# Patient Record
Sex: Male | Born: 1970 | Race: White | Hispanic: No | Marital: Single | Smoking: Former smoker
Health system: Southern US, Community
[De-identification: ages and names within clinical notes are randomized; demographics above are authoritative.]

## PROBLEM LIST (undated history)

## (undated) DIAGNOSIS — Z8659 Personal history of other mental and behavioral disorders: Secondary | ICD-10-CM

## (undated) DIAGNOSIS — G35 Multiple sclerosis: Secondary | ICD-10-CM

## (undated) DIAGNOSIS — Z9889 Other specified postprocedural states: Secondary | ICD-10-CM

## (undated) DIAGNOSIS — R0609 Other forms of dyspnea: Secondary | ICD-10-CM

## (undated) DIAGNOSIS — F32A Depression, unspecified: Secondary | ICD-10-CM

## (undated) DIAGNOSIS — G35D Multiple sclerosis, unspecified: Secondary | ICD-10-CM

## (undated) DIAGNOSIS — H35069 Retinal vasculitis, unspecified eye: Secondary | ICD-10-CM

## (undated) DIAGNOSIS — F419 Anxiety disorder, unspecified: Secondary | ICD-10-CM

## (undated) DIAGNOSIS — I10 Essential (primary) hypertension: Secondary | ICD-10-CM

## (undated) DIAGNOSIS — Z8619 Personal history of other infectious and parasitic diseases: Secondary | ICD-10-CM

## (undated) DIAGNOSIS — Z87442 Personal history of urinary calculi: Secondary | ICD-10-CM

## (undated) DIAGNOSIS — M5030 Other cervical disc degeneration, unspecified cervical region: Secondary | ICD-10-CM

## (undated) DIAGNOSIS — M797 Fibromyalgia: Secondary | ICD-10-CM

## (undated) DIAGNOSIS — R0600 Dyspnea, unspecified: Secondary | ICD-10-CM

## (undated) DIAGNOSIS — N400 Enlarged prostate without lower urinary tract symptoms: Secondary | ICD-10-CM

## (undated) DIAGNOSIS — A630 Anogenital (venereal) warts: Secondary | ICD-10-CM

## (undated) DIAGNOSIS — R51 Headache: Secondary | ICD-10-CM

## (undated) DIAGNOSIS — H209 Unspecified iridocyclitis: Secondary | ICD-10-CM

## (undated) DIAGNOSIS — F1090 Alcohol use, unspecified, uncomplicated: Secondary | ICD-10-CM

## (undated) DIAGNOSIS — M1990 Unspecified osteoarthritis, unspecified site: Secondary | ICD-10-CM

## (undated) DIAGNOSIS — G8929 Other chronic pain: Secondary | ICD-10-CM

## (undated) DIAGNOSIS — H35373 Puckering of macula, bilateral: Secondary | ICD-10-CM

## (undated) DIAGNOSIS — J439 Emphysema, unspecified: Secondary | ICD-10-CM

## (undated) DIAGNOSIS — K219 Gastro-esophageal reflux disease without esophagitis: Secondary | ICD-10-CM

## (undated) DIAGNOSIS — R7303 Prediabetes: Secondary | ICD-10-CM

## (undated) DIAGNOSIS — Z7289 Other problems related to lifestyle: Secondary | ICD-10-CM

## (undated) DIAGNOSIS — Z8719 Personal history of other diseases of the digestive system: Secondary | ICD-10-CM

## (undated) DIAGNOSIS — N200 Calculus of kidney: Secondary | ICD-10-CM

## (undated) DIAGNOSIS — M549 Dorsalgia, unspecified: Secondary | ICD-10-CM

## (undated) DIAGNOSIS — F329 Major depressive disorder, single episode, unspecified: Secondary | ICD-10-CM

## (undated) DIAGNOSIS — Z789 Other specified health status: Secondary | ICD-10-CM

## (undated) DIAGNOSIS — N201 Calculus of ureter: Secondary | ICD-10-CM

## (undated) HISTORY — PX: WISDOM TOOTH EXTRACTION: SHX21

## (undated) HISTORY — DX: Dorsalgia, unspecified: M54.9

## (undated) HISTORY — DX: Anogenital (venereal) warts: A63.0

## (undated) HISTORY — PX: OTHER SURGICAL HISTORY: SHX169

## (undated) HISTORY — DX: Anxiety disorder, unspecified: F41.9

## (undated) HISTORY — PX: CATARACT EXTRACTION W/ INTRAOCULAR LENS  IMPLANT, BILATERAL: SHX1307

## (undated) HISTORY — DX: Multiple sclerosis: G35

## (undated) HISTORY — DX: Other chronic pain: G89.29

## (undated) HISTORY — DX: Multiple sclerosis, unspecified: G35.D

## (undated) HISTORY — DX: Gastro-esophageal reflux disease without esophagitis: K21.9

## (undated) SURGERY — BRONCHOSCOPY, FLEXIBLE
Anesthesia: Moderate Sedation | Laterality: Bilateral

---

## 1998-03-25 ENCOUNTER — Inpatient Hospital Stay (HOSPITAL_COMMUNITY): Admission: EM | Admit: 1998-03-25 | Discharge: 1998-03-25 | Payer: Self-pay | Admitting: Emergency Medicine

## 1998-04-30 ENCOUNTER — Inpatient Hospital Stay (HOSPITAL_COMMUNITY): Admission: EM | Admit: 1998-04-30 | Discharge: 1998-05-08 | Payer: Self-pay | Admitting: Emergency Medicine

## 2000-10-05 ENCOUNTER — Emergency Department (HOSPITAL_COMMUNITY): Admission: EM | Admit: 2000-10-05 | Discharge: 2000-10-06 | Payer: Self-pay | Admitting: Emergency Medicine

## 2000-10-05 ENCOUNTER — Encounter: Payer: Self-pay | Admitting: *Deleted

## 2000-10-05 ENCOUNTER — Encounter: Payer: Self-pay | Admitting: Emergency Medicine

## 2002-01-20 ENCOUNTER — Emergency Department (HOSPITAL_COMMUNITY): Admission: EM | Admit: 2002-01-20 | Discharge: 2002-01-20 | Payer: Self-pay | Admitting: Emergency Medicine

## 2002-04-29 ENCOUNTER — Emergency Department (HOSPITAL_COMMUNITY): Admission: EM | Admit: 2002-04-29 | Discharge: 2002-04-29 | Payer: Medicare Other | Admitting: *Deleted

## 2002-05-16 ENCOUNTER — Emergency Department (HOSPITAL_COMMUNITY): Admission: EM | Admit: 2002-05-16 | Discharge: 2002-05-16 | Payer: Self-pay | Admitting: Emergency Medicine

## 2003-05-17 ENCOUNTER — Emergency Department (HOSPITAL_COMMUNITY): Admission: EM | Admit: 2003-05-17 | Discharge: 2003-05-17 | Payer: Self-pay | Admitting: Emergency Medicine

## 2003-10-10 ENCOUNTER — Emergency Department (HOSPITAL_COMMUNITY): Admission: EM | Admit: 2003-10-10 | Discharge: 2003-10-10 | Payer: Self-pay | Admitting: Emergency Medicine

## 2003-12-27 ENCOUNTER — Emergency Department (HOSPITAL_COMMUNITY): Admission: EM | Admit: 2003-12-27 | Discharge: 2003-12-28 | Payer: Self-pay | Admitting: Emergency Medicine

## 2004-01-18 ENCOUNTER — Ambulatory Visit (HOSPITAL_COMMUNITY): Admission: RE | Admit: 2004-01-18 | Discharge: 2004-01-18 | Payer: Self-pay | Admitting: Family Medicine

## 2004-01-19 ENCOUNTER — Ambulatory Visit (HOSPITAL_COMMUNITY): Admission: RE | Admit: 2004-01-19 | Discharge: 2004-01-19 | Payer: Self-pay | Admitting: Family Medicine

## 2004-01-20 ENCOUNTER — Ambulatory Visit: Payer: Self-pay | Admitting: Internal Medicine

## 2004-01-20 ENCOUNTER — Ambulatory Visit (HOSPITAL_COMMUNITY): Admission: RE | Admit: 2004-01-20 | Discharge: 2004-01-20 | Payer: Self-pay | Admitting: Internal Medicine

## 2004-01-30 ENCOUNTER — Ambulatory Visit (HOSPITAL_COMMUNITY): Admission: RE | Admit: 2004-01-30 | Discharge: 2004-01-30 | Payer: Self-pay | Admitting: Family Medicine

## 2004-02-24 ENCOUNTER — Emergency Department (HOSPITAL_COMMUNITY): Admission: EM | Admit: 2004-02-24 | Discharge: 2004-02-24 | Payer: Self-pay | Admitting: Emergency Medicine

## 2004-07-03 ENCOUNTER — Ambulatory Visit: Payer: Self-pay | Admitting: Internal Medicine

## 2004-07-27 ENCOUNTER — Ambulatory Visit (HOSPITAL_COMMUNITY): Admission: RE | Admit: 2004-07-27 | Discharge: 2004-07-27 | Payer: Self-pay | Admitting: Internal Medicine

## 2004-07-27 ENCOUNTER — Ambulatory Visit: Payer: Self-pay | Admitting: Internal Medicine

## 2004-10-15 ENCOUNTER — Ambulatory Visit: Payer: Self-pay | Admitting: Psychiatry

## 2004-10-15 ENCOUNTER — Inpatient Hospital Stay (HOSPITAL_COMMUNITY): Admission: RE | Admit: 2004-10-15 | Discharge: 2004-10-19 | Payer: Self-pay | Admitting: Psychiatry

## 2004-10-29 ENCOUNTER — Emergency Department (HOSPITAL_COMMUNITY): Admission: EM | Admit: 2004-10-29 | Discharge: 2004-10-29 | Payer: Self-pay | Admitting: Emergency Medicine

## 2005-01-23 ENCOUNTER — Inpatient Hospital Stay (HOSPITAL_COMMUNITY): Admission: EM | Admit: 2005-01-23 | Discharge: 2005-01-25 | Payer: Self-pay | Admitting: Emergency Medicine

## 2006-07-08 ENCOUNTER — Inpatient Hospital Stay (HOSPITAL_COMMUNITY): Admission: EM | Admit: 2006-07-08 | Discharge: 2006-07-09 | Payer: Self-pay | Admitting: Emergency Medicine

## 2006-08-14 ENCOUNTER — Ambulatory Visit (HOSPITAL_COMMUNITY): Admission: RE | Admit: 2006-08-14 | Discharge: 2006-08-14 | Payer: Self-pay | Admitting: Family Medicine

## 2006-11-21 ENCOUNTER — Ambulatory Visit (HOSPITAL_COMMUNITY): Admission: RE | Admit: 2006-11-21 | Discharge: 2006-11-21 | Payer: Self-pay | Admitting: Neurosurgery

## 2007-04-22 ENCOUNTER — Ambulatory Visit (HOSPITAL_COMMUNITY): Admission: RE | Admit: 2007-04-22 | Discharge: 2007-04-22 | Payer: Self-pay

## 2007-10-06 ENCOUNTER — Encounter: Admission: RE | Admit: 2007-10-06 | Discharge: 2007-10-06 | Payer: Self-pay | Admitting: Family Medicine

## 2007-10-20 ENCOUNTER — Encounter: Admission: RE | Admit: 2007-10-20 | Discharge: 2007-10-20 | Payer: Self-pay | Admitting: Family Medicine

## 2007-11-05 ENCOUNTER — Encounter: Admission: RE | Admit: 2007-11-05 | Discharge: 2007-11-05 | Payer: Self-pay | Admitting: Family Medicine

## 2008-01-15 ENCOUNTER — Encounter (INDEPENDENT_AMBULATORY_CARE_PROVIDER_SITE_OTHER): Payer: Self-pay | Admitting: General Surgery

## 2008-01-15 ENCOUNTER — Ambulatory Visit (HOSPITAL_COMMUNITY): Admission: RE | Admit: 2008-01-15 | Discharge: 2008-01-15 | Payer: Self-pay | Admitting: General Surgery

## 2008-05-06 ENCOUNTER — Ambulatory Visit (HOSPITAL_COMMUNITY): Admission: RE | Admit: 2008-05-06 | Discharge: 2008-05-06 | Payer: Self-pay | Admitting: Family Medicine

## 2009-12-29 ENCOUNTER — Ambulatory Visit (HOSPITAL_COMMUNITY)
Admission: RE | Admit: 2009-12-29 | Discharge: 2009-12-29 | Payer: Self-pay | Source: Home / Self Care | Admitting: General Surgery

## 2009-12-30 ENCOUNTER — Emergency Department (HOSPITAL_COMMUNITY): Admission: EM | Admit: 2009-12-30 | Discharge: 2009-12-31 | Payer: Self-pay | Admitting: Emergency Medicine

## 2010-03-03 ENCOUNTER — Encounter: Payer: Self-pay | Admitting: Family Medicine

## 2010-03-04 ENCOUNTER — Encounter: Payer: Self-pay | Admitting: Family Medicine

## 2010-03-29 ENCOUNTER — Encounter (INDEPENDENT_AMBULATORY_CARE_PROVIDER_SITE_OTHER): Payer: Self-pay | Admitting: *Deleted

## 2010-04-04 NOTE — Letter (Signed)
Summary: Unable to Reach, Consult Scheduled  [facility]  [address]   [place], [place] [address]   Phone: [phone]  Fax: [phone]    03/29/2010  Brent [name] [address] [place], [place]  [address] January 12, 1971   Dear Mr. Brent [name],   We have been unable to reach you by phone.     At the recommendation of DR KNOWLTON  we have been asked to schedule you a consult with DR ROURK/DR FIELDS for CHRONIC PANCREATITIS.   Please call our office at [phone].     Thank you,    Diana Eves  [facility] R. Roetta Sessions, M.D.    Jonette Eva, M.D. Lorenza Burton, FNP-BC    Tana Coast, PA-C Phone: [phone]    Fax: [phone]

## 2010-04-23 ENCOUNTER — Ambulatory Visit: Payer: Self-pay | Admitting: Gastroenterology

## 2010-04-24 LAB — CBC
HCT: 42.1 % (ref 39.0–52.0)
Hemoglobin: 14.5 g/dL (ref 13.0–17.0)
MCH: 33.3 pg (ref 26.0–34.0)
MCHC: 34.4 g/dL (ref 30.0–36.0)
MCV: 96.9 fL (ref 78.0–100.0)
MCV: 97.8 fL (ref 78.0–100.0)
Platelets: 307 10*3/uL (ref 150–400)
Platelets: 312 10*3/uL (ref 150–400)
RBC: 4.35 MIL/uL (ref 4.22–5.81)
RDW: 12.7 % (ref 11.5–15.5)
RDW: 13.1 % (ref 11.5–15.5)
WBC: 10.2 10*3/uL (ref 4.0–10.5)
WBC: 9 10*3/uL (ref 4.0–10.5)

## 2010-04-24 LAB — DIFFERENTIAL
Basophils Absolute: 0.1 10*3/uL (ref 0.0–0.1)
Basophils Relative: 1 % (ref 0–1)
Eosinophils Relative: 2 % (ref 0–5)
Lymphocytes Relative: 22 % (ref 12–46)
Neutro Abs: 7.1 10*3/uL (ref 1.7–7.7)

## 2010-04-24 LAB — BASIC METABOLIC PANEL
BUN: 5 mg/dL — ABNORMAL LOW (ref 6–23)
BUN: 5 mg/dL — ABNORMAL LOW (ref 6–23)
CO2: 29 mEq/L (ref 19–32)
Calcium: 9 mg/dL (ref 8.4–10.5)
Chloride: 104 mEq/L (ref 96–112)
Creatinine, Ser: 0.7 mg/dL (ref 0.4–1.5)
Creatinine, Ser: 0.83 mg/dL (ref 0.4–1.5)
GFR calc Af Amer: >60 mL/min (ref >60)
GFR calc non Af Amer: >60 mL/min (ref >60)
GFR calc non Af Amer: >60 mL/min (ref >60)
Glucose, Bld: 80 mg/dL (ref 70–99)
Potassium: 3.5 mEq/L (ref 3.5–5.1)
Sodium: 138 mEq/L (ref 135–145)

## 2010-04-24 LAB — POCT CARDIAC MARKERS: Troponin i, poc: <0.05 ng/mL (ref 0.00–0.09)

## 2010-04-24 LAB — SURGICAL PCR SCREEN: Staphylococcus aureus: POSITIVE — AB

## 2010-05-04 ENCOUNTER — Ambulatory Visit (INDEPENDENT_AMBULATORY_CARE_PROVIDER_SITE_OTHER): Payer: Medicaid Other | Admitting: Gastroenterology

## 2010-05-04 ENCOUNTER — Encounter: Payer: Self-pay | Admitting: Gastroenterology

## 2010-05-04 VITALS — BP 130/78 | HR 92 | Temp 97.7°F | Ht 68.0 in | Wt 116.0 lb

## 2010-05-04 DIAGNOSIS — K625 Hemorrhage of anus and rectum: Secondary | ICD-10-CM

## 2010-05-04 DIAGNOSIS — R1013 Epigastric pain: Secondary | ICD-10-CM

## 2010-05-04 DIAGNOSIS — R634 Abnormal weight loss: Secondary | ICD-10-CM

## 2010-05-04 DIAGNOSIS — K859 Acute pancreatitis without necrosis or infection, unspecified: Secondary | ICD-10-CM | POA: Insufficient documentation

## 2010-05-04 DIAGNOSIS — K6289 Other specified diseases of anus and rectum: Secondary | ICD-10-CM

## 2010-05-04 NOTE — Progress Notes (Signed)
History of Present Illness:   Mr. Risinger is a pleasant 40 year old Caucasian gentleman who presents today for further evaluation of chronic pancreatitis. He states he was diagnosed with chronic pancreatitis back in 2006 by Dr. Sudie Bailey. He has not seen a gastroenterologist for his pancreatitis. Essentially he was hospitalized a couple times in 2006 with acute pancreatitis based on CT finding and elevation of lipase. Etiology was suspected to be due to alcohol consumption. CT at that time showed evidence of acute pancreatitis and questionable small pseudocyst. Followup study was recommended but this was not done. Patient states that he has chronic abdominal pain which radiates into his back. He has been on and off for pancreatic enzymes of the last several years. Over the past 4 months he's been taking them consistently but states he still lost 12 pounds. He has intermittent diarrhea. He quit all alcohol consumption 3 weeks ago. Prior to this he was drinking one 12 ounce beer daily. He gives a sketchy recollection of prior alcohol use basically denies any heavy use. He denies any nausea or vomiting, heartburn, difficulty swallowing. He complains of bright red blood per rectum with each bowel movement as well as rectal pain. 3 months ago he had another treatment for genital warts. He states he was referred by Dr. Caesar Bookman to see Dr. Karilyn Cota for colonoscopy and his appointment is scheduled in April at this year. He states he would like to have all care through our office.   He admits to daily use of Goodys or BC powder, at least 2 daily for chronic pain.  Chronic low back pain into hips and down to his ankles. Everyday. Takes pain pill every morning. Four pain pills a day.   Labs from 03/21/10: WBC 11600, H/H 15.4/45.4, Plt 386000, sed rate 1, Cre 0.74, Tbili 0.4, AP 69, AST 14, ALT 9, alb 4.2, chol 117, trig 87, amylase 65, tsh 0.850, HIV NR Current Outpatient Prescriptions  Medication Sig Dispense Refill  .  ALPRAZolam (XANAX) 0.5 MG tablet Take 0.5 mg by mouth 4 (four) times daily.        Marland Kitchen amylase-lipase-protease (PANCREASE MT 4) 01-15-11 MU per capsule Take by mouth 3 (three) times daily with meals.        . hydrocodone-acetaminophen (LORCET PLUS) 7.5-650 MG per tablet Take 1 tablet by mouth 4 (four) times daily.        Marland Kitchen omeprazole (PRILOSEC) 20 MG capsule Take 20 mg by mouth daily.         No Known Allergies   Past Medical History  Diagnosis Date  . Condylomata acuminata in male     multiple procedures  . GERD (gastroesophageal reflux disease)   . Chronic back pain   . Pancreatitis     H/O  . Suicidal ideation     Admission in 2006 for suicidal ideation without attempt  . Anxiety     Disabled due to panic attacks   Past Surgical History  Procedure Date  . Egd with ed by dr. Karilyn Cota 6/06    erosive RE with small sliding hh. HP neg, 10F  . Tcs by dr. Karilyn Cota 12/05    External hemorrhoids  . Electrocautery/dessication of condyloma of penis and perianal area 2009/2011    Dr. Leticia Penna   Family History  Problem Relation Age of Onset  . Lung cancer Father 52  . Colon cancer Neg Hx   . Colitis Neg Hx   . Cirrhosis Neg Hx   . Liver disease Neg Hx   .  Pancreatic cancer Neg Hx   . Pancreatitis Neg Hx    History   Social History  . Marital Status: Single    Spouse Name: N/A    Number of Children: 0  . Years of Education: N/A   Occupational History  . disabled     anxiety   Social History Main Topics  . Smoking status: Current Everyday Smoker -- 0.5 packs/day for 20 years    Types: Cigarettes  . Smokeless tobacco: Not on file  . Alcohol Use: No     used to drink 1-2 beers per day but quit few weeks ago  . Drug Use: No  . Sexually Active: Not on file    ROS:   General: Negative for anorexia, fever, chills, fatigue, weakness. C/O weight loss.  Eyes: Negative for vision changes.   ENT: Negative for hoarseness, difficulty swallowing , nasal congestion.  CV: Negative for  chest pain, angina, palpitations, dyspnea on exertion, peripheral edema.   Respiratory: Negative for dyspnea at rest, dyspnea on exertion, cough, sputum, wheezing.   GI: See history of present illness.  GU:  Negative for dysuria, hematuria, urinary incontinence, urinary frequency, nocturnal urination.   MS: Negative for joint pain. Positive for low back pain.   Derm: Negative for rash or itching.   Neuro: Negative for weakness, seizure, frequent headaches, memory loss, confusion. C/O sciatica.  Psych: Negative for depression, suicidal ideation, hallucinations. Positive for anxiety.  Endo: C/O unusual weight change. Down 10 pounds.   Heme: Negative for bruising or bleeding.  Allergy: Negative for rash or hives.  Physical Examination:   General: Thin, well-developed in no acute distress.   Head: Normocephalic, atraumatic.    Eyes: Conjunctiva pink, no icterus.  Mouth: Oropharyngeal mucosa moist and pink , no lesions erythema or exudate.  Neck: Supple without thyromegaly, masses, or lymphadenopathy.   Lungs: Clear to auscultation bilaterally.   Heart: Regular rate and rhythm, no murmurs rubs or gallops.   Abdomen: Bowel sounds are normal, nondistended, no hepatosplenomegaly or masses, no abdominal bruits or hernia , no rebound or guarding.  Moderate epigastric tenderness.   Extremities: No lower extremity edema.   Neuro: Alert and oriented x 4 , grossly normal neurologically.   Skin: Warm and dry, no rash or jaundice.    Psych: Alert and cooperative, normal mood and affect.

## 2010-05-06 ENCOUNTER — Encounter: Payer: Self-pay | Admitting: Gastroenterology

## 2010-05-06 NOTE — Assessment & Plan Note (Signed)
Chronic intermittent epigastric pain, diarrhea, weight loss with prior h/o acute pancreatitis. We need to r/o chronic pancreatitis. Also need to consider possibility of PUD given significant ASA/NSAID use. CT A/P with contrast in near future. Pending results, we will plan for EGD/TCS.

## 2010-05-06 NOTE — Assessment & Plan Note (Signed)
See "epigastric pain"

## 2010-05-06 NOTE — Assessment & Plan Note (Addendum)
BRBPR with each BM. Rectal pain. H/O venereal warts s/p multiple treatments by Dr. Leticia Penna. Recommend colonoscopy in the near future. Will plan after CT abd/pelvis complete.   I would like to thank Dr. Sudie Bailey for allowing [place] to take part in the care of this nice patient.

## 2010-05-06 NOTE — Assessment & Plan Note (Signed)
See "rectal bleeding."

## 2010-05-06 NOTE — Assessment & Plan Note (Signed)
H/O acute pancreatitis with couple of hospitalizations in 2006. States he has chronic pancreatitis. He needs current imaging of his pancreas. At this time, there is no documentation supporting dx of chronic pancreatitis.

## 2010-05-09 ENCOUNTER — Ambulatory Visit (HOSPITAL_COMMUNITY)
Admission: RE | Admit: 2010-05-09 | Discharge: 2010-05-09 | Disposition: A | Discharge status: Home / Self Care | Payer: Medicare Other | Source: Ambulatory Visit | Attending: Gastroenterology | Admitting: Gastroenterology

## 2010-05-09 DIAGNOSIS — R1013 Epigastric pain: Secondary | ICD-10-CM

## 2010-05-09 DIAGNOSIS — R634 Abnormal weight loss: Secondary | ICD-10-CM | POA: Insufficient documentation

## 2010-05-09 DIAGNOSIS — K859 Acute pancreatitis without necrosis or infection, unspecified: Secondary | ICD-10-CM

## 2010-05-09 DIAGNOSIS — K8689 Other specified diseases of pancreas: Secondary | ICD-10-CM | POA: Insufficient documentation

## 2010-05-09 LAB — CBC
Albumin: 4.2
Alkaline Phosphatase: 69 U/L
Bilirubin, Total: 0.4 mg/dL
Creat: 0.74
HIV-1 Antibody: NONREACTIVE
Sed Rate: 1
TSH: 0.85
platelet count: 386

## 2010-05-09 MED ORDER — IOHEXOL 300 MG/ML  SOLN
100.0000 mL | Freq: Once | INTRAMUSCULAR | Status: AC | PRN
  Administered 2010-05-09: 100 mL via INTRAVENOUS

## 2010-05-21 ENCOUNTER — Emergency Department (HOSPITAL_COMMUNITY): Payer: Medicare Other

## 2010-05-21 ENCOUNTER — Emergency Department (HOSPITAL_COMMUNITY)
Admission: EM | Admit: 2010-05-21 | Discharge: 2010-05-21 | Disposition: A | Discharge status: Home / Self Care | Payer: Medicare Other | Attending: Emergency Medicine | Admitting: Emergency Medicine

## 2010-05-21 DIAGNOSIS — M542 Cervicalgia: Secondary | ICD-10-CM | POA: Insufficient documentation

## 2010-05-21 DIAGNOSIS — M5412 Radiculopathy, cervical region: Secondary | ICD-10-CM | POA: Insufficient documentation

## 2010-05-21 DIAGNOSIS — K219 Gastro-esophageal reflux disease without esophagitis: Secondary | ICD-10-CM | POA: Insufficient documentation

## 2010-05-21 LAB — POCT CARDIAC MARKERS
CKMB, poc: <1 ng/mL — ABNORMAL LOW (ref 1.0–8.0)
Myoglobin, poc: 32.4 ng/mL (ref 12–200)

## 2010-05-22 ENCOUNTER — Ambulatory Visit (INDEPENDENT_AMBULATORY_CARE_PROVIDER_SITE_OTHER): Payer: Self-pay | Admitting: Internal Medicine

## 2010-05-22 ENCOUNTER — Encounter: Payer: Self-pay | Admitting: Gastroenterology

## 2010-05-28 ENCOUNTER — Other Ambulatory Visit: Payer: Self-pay | Admitting: Internal Medicine

## 2010-05-29 ENCOUNTER — Telehealth: Payer: Self-pay

## 2010-05-29 DIAGNOSIS — K861 Other chronic pancreatitis: Secondary | ICD-10-CM

## 2010-05-29 NOTE — Telephone Encounter (Signed)
Message copied by Chales Abrahams on Tue May 29, 2010  1:24 PM ------      Message from: Rob Bunting      Created: Tue May 29, 2010  7:54 AM      Regarding: RE: EUS REFERRAL        Nazifa Trinka, please schedule him for upper EUS, 60 min, radial +/- linear, ++propofol, next available EUS Thursday: diagnosis chronic pancreatitis.            thanks                  ----- Message -----         From: Chales Abrahams, CMA         Sent: 05/28/2010   2:09 PM           To: Rob Bunting, MD      Subject: FW: EUS REFERRAL                                                     ----- Message -----         From: Peggyann Shoals         Sent: 05/28/2010  11:14 AM           To: Chales Abrahams, CMA      Subject: EUS REFERRAL                                             GOOD MORNING,                  My name is Crystal and I am taking over Camille's job ( she has been promoted to Print production planner). Mr Mcnellis needs to be set up with Dr. Christella Hartigan for an EUS to evaluate dilated pancreatic duct & possible chronic pancreatitis.             Thanks for your help,            Peggyann Shoals            Pt Referral Coordinator       [facility]      [phone]

## 2010-05-29 NOTE — Telephone Encounter (Signed)
Pt scheduled for EUS 06/14/10 945 am need to review meds and instruct pt Left message on machine to call back

## 2010-06-01 NOTE — Telephone Encounter (Signed)
Left message with family member to have the pt call back for his procedure information

## 2010-06-01 NOTE — Telephone Encounter (Signed)
Pt returned call and is aware of the EUS meds have been reviewed.  Pt was advised to call with any further questions or concerns

## 2010-06-13 ENCOUNTER — Telehealth: Payer: Self-pay | Admitting: Gastroenterology

## 2010-06-13 NOTE — Telephone Encounter (Signed)
Unable to reach pt reschedule message left with Endo to cx procedure I will call him tomorrow to reschedule

## 2010-06-14 ENCOUNTER — Encounter: Payer: Medicaid Other | Admitting: Gastroenterology

## 2010-06-14 NOTE — Telephone Encounter (Signed)
Unable to reach pt to reschedule EUS no message machine to leave message.

## 2010-06-15 NOTE — Telephone Encounter (Signed)
Unable to reach pt letter mailed 

## 2010-06-15 NOTE — Telephone Encounter (Signed)
Pt letter mailed.  

## 2010-06-26 NOTE — Discharge Summary (Signed)
NAME:  Brent [name], Brent [name]                ACCOUNT NO.:  [id number]   MEDICAL RECORD NO.:  [id number]          PATIENT TYPE:  INP   LOCATION:  A328                          FACILITY:  [facility]   PHYSICIAN:  Mila Homer. Sudie Bailey, M.D.DATE OF BIRTH:  1970-02-24   DATE OF ADMISSION:  07/08/2006  DATE OF DISCHARGE:  05/28/2008LH                               DISCHARGE SUMMARY   HISTORY OF PRESENT ILLNESS:  This is a 40 year old who was admitted to  the hospital with pancreatitis.  He had a benign and stable two-day  hospital course.  His vital signs were normal.  The hospital stay lasted  from 07/08/2006 to 07/09/2006.   His admission white cell count was 15,000 with 83% neutrophils and 8  lymphs.  CMP showed a glucose of 108.  Admission amylase was 358, which  dropped to 137 on the second day.  His admission lipase was 171, which  dropped to 75 on the second day.  Recheck CMET was essentially normal  and his recheck white cell count 10,400 with 68 neutrophils and 15  lymphs.   His abdominal ultrasound was unremarkable except for a left lower pole  renal calculus.   He was initially treated in the ED and given Demerol for pain.  He was  then admitted to the hospital and put on normal saline IV at 125 mL an  hour, lorazepam 1 mg q.i.d. p.r.n., a clear liquid diet, multivitamins  daily and Demerol 50 mg IV q.3 hours p.r.n. severe pain.  He is on  Protonix 40 mg IV q.24 hours.   He did well on this regimen.  His pain improved such that on the  following day, his second day, he was essentially pain free and was able  to tolerate clear liquids normally and was ready for discharge home.   FINAL DISCHARGE DIAGNOSES:  1. 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  2. 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  3. Renal calculus.  4. Chronic anxiety.   He has alprazolam 1 mg q.i.d. to use at home if he needs it and also  Nexium 40 mg daily for reflux symptoms.   He will followup at his next  scheduled appointment.      Mila Homer. Sudie Bailey, M.D.  Electronically Signed     SDK/MEDQ  D:  07/09/2006  T:  07/09/2006  Job:  [id number]

## 2010-06-26 NOTE — H&P (Signed)
NAME:  Brent [name], Brent [name]                ACCOUNT NO.:  [id number]   MEDICAL RECORD NO.:  [id number]          PATIENT TYPE:  INP   LOCATION:  A328                          FACILITY:  [facility]   PHYSICIAN:  Mila Homer. Sudie Bailey, M.D.DATE OF BIRTH:  Jun 18, 1970   DATE OF ADMISSION:  07/08/2006  DATE OF DISCHARGE:  LH                              HISTORY & PHYSICAL   HISTORY OF PRESENT ILLNESS:  This 40 year old was drinking heavily the  three days prior to admission.  He then developed abdominal pain in the  upper abdomen that then tended to localize in the right upper quadrant  and then some back pain and burning as well similar to the pain he had a  year ago when he had acute pancreatitis.  At that time, he had not been  drinking.   He usually says he does not take any alcohol, but he was drinking with  friend over the [place] Day weekend.  He had nothing to drink or  yesterday hoping that things would clear up, and today it was better  than yesterday, but he still had significant enough pain to present to  the emergency room.   He has had nausea and vomiting.  Denies fever or diarrhea.  He has had  pain in the epigastrium radiating to the back, as noted.   CURRENT MEDICATIONS:  1. Current medications are limited to Nexium 40 mg daily.  2. Hydrocodone chronic.   PAST MEDICAL HISTORY:  He has had no cardiac or pulmonary symptomatology  or neurologic symptomatology.   ALLERGIES:  HE HAS NO KNOWN DRUG ALLERGIES.   PHYSICAL EXAMINATION:  GENERAL/VITAL SIGNS:  Initial examination shows a  pleasant middle aged man.  Temperature 98.4, blood pressure 133/81,  pulse 115, respiratory rate 20, oxygen saturation 97%, and three hours  later the blood pressure was 122/91 and the pulse rate was down to 92.  At the time of my exam, he was oriented, alert, in no acute distress,  well-developed, well-nourished.  His sentence structure are intact.  He  had no slurring of the speech.  He seemed  energetic.  HEENT:  Pharynx is normal.  HEART:  The heart had a regular rhythm, with a rate of about 70.  LUNGS:  The lungs are clear, without wheezing.  He is moving air well.  ABDOMEN:  The abdomen is flat and soft, without hepatosplenomegaly or  mass.  There was no edema in the ankles.  There was no real CVA or flank  pain.   LABORATORY DATA:  His admission white cell count is 15,000, of which 83%  are neutrophils, 8% lymphocytes, 9% monocytes.  His H and H was 17.4 and  48.7.  MCV of 95.  Platelet count 356,000.  CMP showed a glucose of 108,  BUN 12, creatinine of 0.84.  Lipase was 171 (normal 11-59) and amylase  was 358 (normal 27-131).   ADMITTING DIAGNOSES:  1. Acute pancreatitis.  2. Ethanol alcohol overuse.  3. Reflux esophagitis.   PLAN:  He will be admitted to the hospital on IVs and normal saline at  125 mL/hr., IV Protonix 40 mg q.24h., and Demerol 50 mg IV q.3h., if  needed, for severe pain.  He will be on clear liquids.  We will recheck  his amylase, lipase, and white cell count in the morning.  He  understands alcohol's effect on pancreatitis and he promises not to be  overusing alcohol again.      Mila Homer. Sudie Bailey, M.D.  Electronically Signed     SDK/MEDQ  D:  07/08/2006  T:  07/08/2006  Job:  [id number]

## 2010-06-26 NOTE — Op Note (Signed)
NAME:  Brent [name], Brent [name]                ACCOUNT NO.:  [id number]   MEDICAL RECORD NO.:  [id number]          PATIENT TYPE:  AMB   LOCATION:  DAY                           FACILITY:  [facility]   PHYSICIAN:  Tilford Pillar, MD      DATE OF BIRTH:  11/08/1970   DATE OF PROCEDURE:  01/15/2008  DATE OF DISCHARGE:                               OPERATIVE REPORT   PREOPERATIVE DIAGNOSIS:  Condyloma of the penile shaft, perineum, and  perirectal region.   POSTOPERATIVE DIAGNOSIS:  Condyloma of the penile shaft, perineum, and  perirectal region.   PROCEDURE:  Electrocautery, desiccation of condylomatous lesions of the  penile shaft, perineum, and rectum.   SURGEON:  Tilford Pillar, MD.   ANESTHESIA:  General endotracheal.   SPECIMENS:  Condylomatous lesions of the rectum and anus.   ESTIMATED BLOOD LOSS:  Minimal.   DISPOSITION:  To postanesthesic care unit in stable condition.   INDICATIONS:  The patient is a 40 year old male who was referred to my  office with a 10-year history of genital warts along his penis and  rectum.  These were chronic and the patient had been referred to [facility] for laser ablation, but has failed to proceed to his  appointment.  He was referred to our office for re-evaluation.  He did  have extensive condylomatous disease at the proximal penile shaft, close  to the glans as well as the perineum and perirectal region.  I had a  long discussion with the patient in regards to ablation of the lesions  due to the extensive nature of the disease.  I did discuss with him the  high likelihood of having some residual disease even with aggressive  treatment.  The patient understood the risks, benefits, and  alternatives.  The patient also understood to avoid any sexual contact  over the next several days, as well as to refrain from unprotected  sexual intercourse.  He consented for a planned procedure.   OPERATIVE PROCEDURE:  The patient was taken to the operating room,  and  was placed in supine position on the operating table, at which time, the  general anesthetic was administered.  Once he was asleep, he was  endotracheally intubated by anesthesia.  At this time, his abdominal  wall, penis, perineum, and rectum were prepped with Betadine solution.  A sterile dressing was placed in a standard fashion.  At this time, we  began extensive electrodesiccation of the condylomatous disease.  I did  start it anteriorly along the several lesions located in the suprapubic  region.  This was carried down along the shaft of the penis using  electrocautery.  He had extensive disease along the ventral surface as  well as isolated area on the dorsal surface.   The glans of the penis actually appeared uninvolved as well as along the  scrotum except for a couple of isolated areas.  I again continued the  electrodesiccation of these lesions.  Upon completion of this area, I  did proceed to the perineum and the perirectal area.  The perirectal  lesions appeared to  be including some hemorrhoidal tissue versus a  polypoid condyloma on both aspects of the anal opening, as this did not  involve the underlying mesenteric musculature.  These lesions were  excised, the majority of these lesions, and any remaining lesions were  ablated with the electrocautery.   Prior to completion of both the extensive electrocautery desiccation, I  did place the Betadine ointment over all involved areas.  This was  covered with a Telfa dressing, they are non-stick coverage over the  areas and I have secured with a 2-inch Kerlix and Kling around the shaft  of the penis.  Gauze dressings was secured over the other areas, was  patent.  All dressings were finally secured with mesh underwear.  At  this time, the drapes were removed.  The patient was allowed to come out  of general anesthetic, and was transferred to postanesthesia care unit  in stable condition.  At the end of procedure, all  instruments, sponge,  and needle counts were correct.  The patient tolerated the procedure  well.      Tilford Pillar, MD  Electronically Signed     BZ/MEDQ  D:  01/15/2008  T:  01/16/2008  Job:  [id number]   cc:   Dr. Stephannie Peters

## 2010-06-26 NOTE — H&P (Signed)
NAME:  Brent [name], Brent [name]                ACCOUNT NO.:  [id number]   MEDICAL RECORD NO.:  [id number]          PATIENT TYPE:  AMB   LOCATION:  DAY                           FACILITY:  [facility]   PHYSICIAN:  Tilford Pillar, MD      DATE OF BIRTH:  04/17/1970   DATE OF ADMISSION:  DATE OF DISCHARGE:  LH                              HISTORY & PHYSICAL   CHIEF COMPLAINT:  Genital and perianal warts.   HISTORY OF PRESENT ILLNESS:  The patient is a 40 year old male who  presented to my office on the referral by Dr. Sudie Bailey for a history of  genital, perineal and perirectal warts.  He states that these have been  present for approximately 10 years.  They have become more extensive.  He does have occasional drainage from this area, occasional excoriation,  and subsequent bleeding.  He also has occasional pain associated with it  although states that significant amount of sensation, especially on the  proximal aspect of his penis is quite diminished.  He states that he had  no fever or chills.  Despite being advised not to, the patient is still  sexually active.   PAST MEDICAL HISTORY:  1. Gastroesophageal reflux disease.  2. Anxiety.   PAST SURGICAL HISTORY:  None.   MEDICATIONS:  1. Xanax.  2. Nexium   ALLERGIES:  No known drug allergies.   SOCIAL HISTORY:  He smokes approximately 2 packs per week and denies any  alcohol or recreational drug use.   FAMILY HISTORY:  He does have a family history of cancer.   REVIEW OF SYSTEMS:  CONSTITUTIONAL:  Unremarkable.  EYES:  Unremarkable.  EARS, NOSE, AND THROAT:  Unremarkable.  PULMONARY:  Unremarkable.  CARDIOVASCULAR:  Unremarkable.  GASTROINTESTINAL:  Indigestion and  heartburn, otherwise unremarkable.  MUSCULOSKELETAL:  Complains of  arthralgias of the joints, neck and back.  GENITOURINARY:  Other than  HPI, unremarkable.  ENDOCRINE:  Complains of being tired and sluggish.  NEUROLOGIC:  Complains of tremors, occasional dizzy spells,  occasional  paresthesias.   PHYSICAL EXAMINATION:  GENERAL:  The patient is a much older appearing  male.  He is age appropriate.  He is alert and oriented x3.  He is not  in any acute distress.  He is somewhat anxious on his appearance.  HEENT:  Scalp, no deformities, no masses.  Eyes: Pupils are equal,  round, and reactive.  Extraocular movements are intact.  No conjunctival  pallor is noted.  Oral mucosa is pink.  Normal occlusion.  NECK:  Trachea is midline.  No cervical lymphadenopathy.  PULMONARY:  Unlabored respirations.  He is clear to auscultation  bilaterally.  CARDIOVASCULAR:  Regular rate and rhythm.  He has 2+ radial pulses  bilaterally.  ABDOMEN:  Positive bowel sounds.  Soft, nontender.  GENITALS:  He has bilaterally descended testicles.  He does have  significant verrucous lesions on the shaft of the penis including some  aspects of the glans, mostly on the outer aspect.  The perineum itself  is relatively free of any verrucous lesions.  There is a sizeable  approximate 3 cm verrucous lesion within the perirectal area.  There is  no significant drainage noted.  No pain is elicited.  EXTREMITIES:  Warm and dry.   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  At this time, I did have a long discussion with  the patient in regards to treatment options.  The patient in the earlier  discussion had several attempts at cryoablation in [place] with  limited results.  The patient was supposed to have an appointment at  [facility] for laser ablation, however, the patient was unable to make his  appointment and has not since followed up.  I did discuss with the  patient, options including chemical ablation, cryoablation, laser  thermal ablation, as well as electrocautery ablation.  I did discuss  with the patient at the extensive nature, I would likely require  multiple procedures to diminish the overall number.  We also did discuss   the possibility of skin cancers including squamous cell carcinoma.  I do  not have any suspicion that any of the lesions are currently to that  extent.  I did discuss with the patient close continuous followup.  Due  to the extensive nature of the disease, I did discuss performing this  with electrocautery in the operating room with attempts to maximize the  treated area.  I did discuss with the patient the possibility of  postoperative pain, discharge, and infection.  I  also did discuss with  the patient the possibility of erectile dysfunction and possibility of  scarring and deformity, however, I did discuss with the patient that  this is certainly unlikely.  I discussed with the patient that this was  not emergency but did again discuss the need to abstain from sexual  intercourse, in particular unprotected sexual intercourse.  At this  time, the patient does wish to proceed at a relatively expediated course  and we will plan to schedule this patient at his earliest convenience.      Tilford Pillar, MD  Electronically Signed     BZ/MEDQ  D:  01/14/2008  T:  01/15/2008  Job:  [id number]   cc:   Nonnie Done  Fax: [phone]   Short Stay Surgery
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
## 2010-06-29 NOTE — Consult Note (Signed)
Brent [name], Brent [name]                ACCOUNT NO.:  [id number]   MEDICAL RECORD NO.:  [id number]          PATIENT TYPE:  AMB   LOCATION:                                 FACILITY:   PHYSICIAN:  Lionel December, M.D.    DATE OF BIRTH:  [birth year]   DATE OF CONSULTATION:  07/03/2004  DATE OF DISCHARGE:                                   CONSULTATION   REASON FOR CONSULTATION:  Esophageal stricture, EGD.   HISTORY OF PRESENT ILLNESS:  Brent [name] is a 40 year old Caucasian gentleman, a  patient of Mila Homer. Sudie Bailey, M.D., who presents today for further  evaluation of dysphagia.  He is suspected to have an esophageal stricture,  and Dr. Sudie Bailey is requesting EGD.  The patient states he has had  difficulty swallowing for at least 10 years but has been worse lately.  About seven months ago he had a real difficult time swallowing and with  early satiety.  He states he lost down to 115 pounds.  He was supposed to  get an upper endoscopy done around that time but for whatever reason, he did  not have this done.  He complains of difficulty swallowing meat and  swallowing just about everything.  Interestingly, however, he states he has  no difficulty swallowing steak.  No difficulty with liquids or pills.  After  eating, he feels a fullness in his lower substernal chest region.  After an  hour or so, he may vomit.  This relieves the pressure and discomfort.  He  wakes up in the early morning hours with similar symptoms.  He describes it  as feeling that his esophagus is backing up.  He complains of feeling like  he is going to choke.  He also has heartburn symptoms.  Previously had been  on Nexium but denies any improvement of the symptoms.  He is not on any  medication at this time.  He complains of epigastric discomfort.  He  describes early satiety.  He states that he eats 10 small meals daily  because of this.  Denies any constipation or diarrhea, melena or rectal  bleeding.  He had a CT of the  abdomen and pelvis back in December 2005.  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  He also  had what appeared to be a mass in the rectum, although this was determined  to be artifact as based on a colonoscopy.  On colonoscopy by Dr. Karilyn Cota,  there were external hemorrhoids only.  It was felt that he might have a  redundancy of rectal mucosa/submucosa which appeared to be a mass on CT.   CURRENT MEDICATIONS:  1. Xanax 1 mg t.i.d.  2. Theraflu p.r.n.  3. Aspirin 325 mg p.r.n.     ALLERGIES:  No known drug allergies.   PAST MEDICAL HISTORY:  1. Gastroesophageal reflux disease.  2. Constipation.  3. Venereal warts.  4. Panic attacks, for which he has disability.     FAMILY HISTORY:  Father died of lung cancer at age 6.  Family history is  negative for colorectal cancer or chronic GI illnesses.   SOCIAL HISTORY:  Single.  He has no children.  He is on disability secondary  to panic attacks.  He smokes two to three cigarettes daily.  He denies any  alcohol or illicit drug use.   REVIEW OF SYSTEMS:  GASTROINTESTINAL:  See HPI.  CONSTITUTIONAL:  See HPI.  CARDIOPULMONARY:  Denies chest pain or shortness of breath.   PHYSICAL EXAMINATION:  VITAL SIGNS:  Weight 121-1/2 pounds, height 5 feet 7  inches.  Temperature 98.4, blood pressure 104/68, pulse 88.  GENERAL:  A pleasant, thin Caucasian male in no acute distress.  SKIN:  Warm and dry, no jaundice.  HEENT:  Pupils equal, round, and reactive to light.  Conjunctivae are pink.  Sclerae are nonicteric.  Oropharyngeal mucosa moist and pink with mild to  moderate pharyngitis but no exudate.  NECK:  No lymphadenopathy or thyromegaly.  CHEST:  Lungs clear to auscultation.  CARDIAC:  Regular rate and rhythm, normal S1, S2.  No murmurs, rubs or  gallops.  ABDOMEN:  Positive bowel sounds, soft.  Mild epigastric tenderness to deep   palpation.  No organomegaly or masses.  No rebound tenderness or guarding.  No abdominal bruits or hernias.  EXTREMITIES:  No edema.   IMPRESSION:  Brent [name] is a 40 year old gentleman with a 10-year history of  dysphagia which has been worsening recently.  He describes symptoms typical  of esophageal stricture.  In addition, he has typical reflux symptoms which  were previously unresponsive to Nexium.  He complains of early satiety,  which I am concerned about.  Would be concerned about peptic ulcer disease  with partial gastric outlet obstruction.  He takes aspirin intermittently.   PLAN:  1. EGD with esophageal dilatation in the near future.  2. A trial of Zegerid 40 mg daily, #10 samples given.  3. Further recommendations to follow.        LL/MEDQ  D:  07/03/2004  T:  07/03/2004  Job:  [id number]   cc:   Mila Homer. Sudie Bailey, M.D.  [address] [place], [place] [address]  Fax: [phone]

## 2010-06-29 NOTE — Group Therapy Note (Signed)
NAMEMOHAN, Brent [name]                ACCOUNT NO.:  [id number]   MEDICAL RECORD NO.:  [id number]          PATIENT TYPE:  INP   LOCATION:  A308                          FACILITY:  [facility]   PHYSICIAN:  Mila Homer. Sudie Bailey, M.D.DATE OF BIRTH:  1970-04-23   DATE OF PROCEDURE:  01/24/2005  DATE OF DISCHARGE:                                   PROGRESS NOTE   SUBJECTIVE:  He is feeling much better today.  His abdominal pain is much  improved.   OBJECTIVE:  VITAL SIGNS:  Temperature 99.6, pulse 91, respirations 20, blood  pressure 141/83.  LUNGS:  Clear throughout.  HEART:  Regular rhythm rate of 80.  ABDOMEN:  Soft without hepatosplenomegaly or mass.  There is some tenderness  of palpation of the epigastrium and the mid abdomen.  The rest of the  abdomen is nontender to palpation.  There is no edema of the ankles.  SKIN:  Turgor is normal.  Mucous membranes moist.   Abdominal CT showed stranding around the pancreas consistent with  pancreatitis.  The abdominal ultrasound showed a common bile duct of 5 mm  diameter.  There was what appeared to be gallbladder sludge, but no stones.  There was slight gallbladder thickening.  Lab test showed a total  cholesterol of 110, triglycerides 114, HDL 35, LDL 52.   ASSESSMENT:  Pancreatitis.   PLAN:  Will start him on clear liquids today.  Add Pancrease three capsules  at meals and one with snacks.  He will be up in a chair.  Continue IV  fluids.  Hope to discharge tomorrow.  Of course, he will be off alcohol when  he gets home.  Discussed all this with patient and his significant other.  Do not think gallstones have caused this at this point.      Mila Homer. Sudie Bailey, M.D.  Electronically Signed     SDK/MEDQ  D:  01/24/2005  T:  01/25/2005  Job:  [id number]

## 2010-07-06 ENCOUNTER — Telehealth: Payer: Self-pay | Admitting: Gastroenterology

## 2010-07-12 NOTE — Telephone Encounter (Signed)
Left message on machine to call back  

## 2010-07-16 NOTE — Telephone Encounter (Signed)
Left message with family member to return call.

## 2010-07-17 NOTE — Telephone Encounter (Signed)
Left message on machine to call back letter mailed. 

## 2010-07-27 ENCOUNTER — Telehealth: Payer: Self-pay | Admitting: *Deleted

## 2010-07-27 ENCOUNTER — Encounter: Payer: Self-pay | Admitting: *Deleted

## 2010-07-27 NOTE — Telephone Encounter (Signed)
Pt called to schedule an appt for EUS. Per notes on 05/29/10, Dr Christella Hartigan wants to order Upper EUS, 60 min, radial +/- linear with Propofol, dx chronic pancreatitis. Scheduled pt with Arlys John for September 06, 2010 at 0830am, he needs to arrive at 0700am. Booking # [id number]. Will mail pt a letter with all this. Pt stated understanding.

## 2010-09-05 ENCOUNTER — Telehealth: Payer: Self-pay

## 2010-09-05 NOTE — Telephone Encounter (Signed)
Has not been seen since 04/2010. Needs OV.

## 2010-09-05 NOTE — Telephone Encounter (Signed)
Pt called- he is scheduled for an EUS tomorrow. He has been having increased abd pain and rectal bleeding. We had tried to contact pt after CT and was not able to get in touch with him to schedule tcs. We mailed letter to pt and asked him to call. Pt stated he never got letter. He said that his sisters he lives with dont give him his messages and usually throws away his mail. Informed pt we had wanted to schedule procedures in OR. Can we triage and schedule or does pt need ov? Please advise. His cell number is [phone]

## 2010-09-06 ENCOUNTER — Ambulatory Visit (HOSPITAL_COMMUNITY)
Admission: RE | Admit: 2010-09-06 | Discharge: 2010-09-06 | Disposition: A | Discharge status: Home / Self Care | Payer: Medicare Other | Source: Ambulatory Visit | Attending: Gastroenterology | Admitting: Gastroenterology

## 2010-09-06 ENCOUNTER — Encounter: Payer: Medicaid Other | Admitting: Gastroenterology

## 2010-09-06 ENCOUNTER — Other Ambulatory Visit: Payer: Self-pay | Admitting: Gastroenterology

## 2010-09-06 DIAGNOSIS — F1010 Alcohol abuse, uncomplicated: Secondary | ICD-10-CM | POA: Insufficient documentation

## 2010-09-06 DIAGNOSIS — R1310 Dysphagia, unspecified: Secondary | ICD-10-CM | POA: Insufficient documentation

## 2010-09-06 DIAGNOSIS — R109 Unspecified abdominal pain: Secondary | ICD-10-CM | POA: Insufficient documentation

## 2010-09-06 DIAGNOSIS — Z79899 Other long term (current) drug therapy: Secondary | ICD-10-CM | POA: Insufficient documentation

## 2010-09-06 DIAGNOSIS — K219 Gastro-esophageal reflux disease without esophagitis: Secondary | ICD-10-CM | POA: Insufficient documentation

## 2010-09-06 DIAGNOSIS — R933 Abnormal findings on diagnostic imaging of other parts of digestive tract: Secondary | ICD-10-CM

## 2010-09-06 DIAGNOSIS — R634 Abnormal weight loss: Secondary | ICD-10-CM

## 2010-09-06 DIAGNOSIS — R197 Diarrhea, unspecified: Secondary | ICD-10-CM | POA: Insufficient documentation

## 2010-09-06 DIAGNOSIS — K319 Disease of stomach and duodenum, unspecified: Secondary | ICD-10-CM | POA: Insufficient documentation

## 2010-09-06 DIAGNOSIS — K861 Other chronic pancreatitis: Secondary | ICD-10-CM | POA: Insufficient documentation

## 2010-09-06 NOTE — Telephone Encounter (Signed)
Please schedule appt for pt. thanks

## 2010-09-12 NOTE — Telephone Encounter (Signed)
LMOM for pt to return my call to set up OV

## 2010-09-17 NOTE — Telephone Encounter (Signed)
Disregard my last note. Pt is a [facility] patient.

## 2010-09-17 NOTE — Telephone Encounter (Signed)
Pt is aware of OV for 8/13 at 11 w/AS

## 2010-09-17 NOTE — Telephone Encounter (Signed)
Pt has not returned my call. I noticed that he is a NUR pt and was seen by his office in April. Please advise if I should continue to try to reach this patient.

## 2010-09-19 ENCOUNTER — Other Ambulatory Visit (HOSPITAL_COMMUNITY): Payer: Self-pay | Admitting: Family Medicine

## 2010-09-19 ENCOUNTER — Ambulatory Visit (HOSPITAL_COMMUNITY)
Admission: RE | Admit: 2010-09-19 | Discharge: 2010-09-19 | Disposition: A | Discharge status: Home / Self Care | Payer: Medicare Other | Source: Ambulatory Visit | Attending: Family Medicine | Admitting: Family Medicine

## 2010-09-19 DIAGNOSIS — J40 Bronchitis, not specified as acute or chronic: Secondary | ICD-10-CM

## 2010-09-19 DIAGNOSIS — M545 Low back pain, unspecified: Secondary | ICD-10-CM | POA: Insufficient documentation

## 2010-09-19 DIAGNOSIS — M4120 Other idiopathic scoliosis, site unspecified: Secondary | ICD-10-CM | POA: Insufficient documentation

## 2010-09-19 DIAGNOSIS — M51379 Other intervertebral disc degeneration, lumbosacral region without mention of lumbar back pain or lower extremity pain: Secondary | ICD-10-CM | POA: Insufficient documentation

## 2010-09-19 DIAGNOSIS — M5137 Other intervertebral disc degeneration, lumbosacral region: Secondary | ICD-10-CM | POA: Insufficient documentation

## 2010-09-24 ENCOUNTER — Ambulatory Visit (INDEPENDENT_AMBULATORY_CARE_PROVIDER_SITE_OTHER): Payer: Medicare Other | Admitting: Gastroenterology

## 2010-09-24 ENCOUNTER — Encounter: Payer: Self-pay | Admitting: Gastroenterology

## 2010-09-24 VITALS — BP 120/76 | HR 77 | Temp 98.1°F | Ht 68.0 in | Wt 125.0 lb

## 2010-09-24 DIAGNOSIS — K625 Hemorrhage of anus and rectum: Secondary | ICD-10-CM

## 2010-09-24 DIAGNOSIS — R1013 Epigastric pain: Secondary | ICD-10-CM

## 2010-09-24 NOTE — Assessment & Plan Note (Signed)
Reports constant epigastric pain, dull, worsened with large amounts of food. Hx of pancreatitis in past, but EUS shows no signs of chronic pancreatitis. Pt has stopped pancreatic enyzmes. Up 9 lbs now. Taking Prilosec 40 mg daily. Gallbladder remains in situ. Will trial Dexilant at this time, stop prilosec. No lab abnormalities in Feb 2012 with Tbili 0.4, AP 69, AST 14, ALT 9. Unable to exclude other etiology at this time. May need [place] of abdomen in future. Proceed with Dexilant trial, colonoscopy, and follow-up in office after colonoscopy for further recommendations.

## 2010-09-24 NOTE — Progress Notes (Signed)
Referring Provider: Milana Obey, MD Primary Care Physician:  Milana Obey, MD Primary Gastroenterologist: Dr. Jena Gauss   Chief Complaint  Patient presents with  . Colonoscopy    needs done in OR    HPI:   Mr. Brent [name] is a 40 year old Caucasian male who presents today in follow-up after EUS and a visit prior to colonoscopy. He reports history of chronic pancreatitis, dating back to 2006. An EUS done by Dr. Christella Hartigan September 06, 2010 showed normal esophagus and stomach, irregular duodenal mucosa, question of peptic duodenitis. Biopsy negative for celiac sprue. No changes of chronic pancreatitis or discrete masses. Main pancreatic duct normal, CBD normal and non-dilated without filling defects, no peripancreatic adenopathy.  Reports wt loss of 12 lbs over last few months. He has actually gained 9 lbs since March when he was first seen by our office. Has hx of genital warts, last procedure by Dr. Leticia Penna in Nov 2011.  He complains of constant epigastric pain, dull, denies drinking alcohol. States he quit 2 years ago; this is inconsistent with his report in March where he admitted drinking up until 3 weeks prior to visit. Epigastric pain worsened with large amounts of food. No longer taking pancreatic enzymes. States no diarrhea now since stopping enzymes.  Reports intermittent rectal bleeding, pain in perineum area. BM twice per day. Takes BC powders, 1-2 per day.   Enjoys eating ice cream and steaks.  Was supposed to have a colonoscopy; however, we had difficulty getting up with the pt.   May 09 2010 CT:  No evidence of acute pancreatitis.The only subtle finding of chronic pancreatitis is mild dilatation of the pancreatic duct. Lumbar degenerative disc disease is prominent at L4-5 and L5 S1.  Past Medical History  Diagnosis Date  . Condylomata acuminata in male     multiple procedures  . GERD (gastroesophageal reflux disease)   . Chronic back pain   . Pancreatitis     H/O  .  Suicidal ideation     Admission in 2006 for suicidal ideation without attempt  . Anxiety     Disabled due to panic attacks    Past Surgical History  Procedure Date  . Egd with ed by dr. Karilyn Cota 6/06    erosive RE with small sliding hh. HP neg, 55F  . Tcs by dr. Karilyn Cota 12/05    External hemorrhoids  . Electrocautery/dessication of condyloma of penis and perianal area 2009/2011    Dr. Leticia Penna    Current Outpatient Prescriptions  Medication Sig Dispense Refill  . ALPRAZolam (XANAX) 0.5 MG tablet Take 0.5 mg by mouth 4 (four) times daily.        . hydrocodone-acetaminophen (LORCET PLUS) 7.5-650 MG per tablet Take 1 tablet by mouth 4 (four) times daily.        Marland Kitchen omeprazole (PRILOSEC) 20 MG capsule Take 20 mg by mouth daily.        Marland Kitchen amylase-lipase-protease (PANCREASE MT 4) 01-15-11 MU per capsule Take by mouth 3 (three) times daily with meals.         Allergies as of 09/24/2010  . (No Known Allergies)    Family History  Problem Relation Age of Onset  . Lung cancer Father 5  . Colon cancer Neg Hx   . Colitis Neg Hx   . Cirrhosis Neg Hx   . Liver disease Neg Hx   . Pancreatic cancer Neg Hx   . Pancreatitis Neg Hx     History   Social History  . Marital  Status: Single    Spouse Name: N/A    Number of Children: 0  . Years of Education: N/A   Occupational History  . disabled     anxiety   Social History Main Topics  . Smoking status: Current Everyday Smoker -- 0.5 packs/day for 20 years    Types: Cigarettes  . Smokeless tobacco: Not on file  . Alcohol Use: No     hx of use in past. Denies use currently.   . Drug Use: No  . Sexually Active: Not on file   Other Topics Concern  . Not on file   Social History Narrative  . No narrative on file    Review of Systems: Gen: Denies fever, chills, anorexia. Denies fatigue, weakness, + weight loss.  CV: Denies chest pain, palpitations, syncope, peripheral edema, and claudication. Resp: Denies dyspnea at rest, cough,  wheezing, coughing up blood, and pleurisy. GI: Denies vomiting blood, jaundice, and fecal incontinence.   Denies dysphagia or odynophagia. Derm: Denies rash, itching, dry skin Psych: Denies depression, anxiety, memory loss, confusion. No homicidal or suicidal ideation.  Heme: Denies bruising, bleeding, and enlarged lymph nodes.  Physical Exam: BP 120/76  Pulse 77  Temp(Src) 98.1 F (36.7 C) (Temporal)  Ht 5\' 8"  (1.727 m)  Wt 125 lb (56.7 kg)  BMI 19.01 kg/m2 General:   Alert and oriented. No distress noted. Pleasant and cooperative. Thin but not malnourished-appearing.  Head:  Normocephalic and atraumatic. Eyes:  Conjuctiva clear without scleral icterus. Mouth:  Oral mucosa pink and moist. Good dentition. No lesions. Neck:  Supple, without mass or thyromegaly. Heart:  S1, S2 present without murmurs, rubs, or gallops. Regular rate and rhythm. Abdomen:  +BS, soft, TTP LUQ and non-distended. No rebound or guarding. No HSM or masses noted. Msk:  Symmetrical without gross deformities. Normal posture. Extremities:  Without edema. Neurologic:  Alert and  oriented x4;  grossly normal neurologically. Skin:  Intact without significant lesions or rashes. Cervical Nodes:  No significant cervical adenopathy. Psych:  Alert and cooperative. Normal mood and affect.

## 2010-09-24 NOTE — Assessment & Plan Note (Signed)
40 year old Caucasian male with intermittent rectal bleeding, complaints of perineum discomfort (hx of genital warts with procedures by Dr. Leticia Penna in past). Last colonoscopy in 2005. May be benign anorectal source, needs further evaluation, especially with reported weight loss. As of note, pt is up 9 lbs from last visit in March.  Proceed with TCS with Dr. Jena Gauss in near future, with the assistance of anesthesia due to hx of ETOH use: the risks, benefits, and alternatives have been discussed with the patient in detail. The patient states understanding and desires to proceed. Further recommendations to follow

## 2010-09-24 NOTE — Patient Instructions (Signed)
We have set you up for a colonoscopy. Further recommendations to follow after this.  Stop omeprazole (Prilosec). Start Dexilant 60 mg daily, one each morning. Contact us if any improvement in your symptoms.   We can call in a prescription for you if this helps.   Further recommendations after procedure is completed.

## 2010-09-24 NOTE — Progress Notes (Signed)
Cc to PCP 

## 2010-09-28 ENCOUNTER — Encounter (HOSPITAL_COMMUNITY): Admission: RE | Admit: 2010-09-28 | Payer: Medicare Other | Source: Ambulatory Visit

## 2010-10-01 ENCOUNTER — Encounter (HOSPITAL_COMMUNITY): Payer: Self-pay

## 2010-10-01 ENCOUNTER — Encounter (HOSPITAL_COMMUNITY)
Admission: RE | Admit: 2010-10-01 | Discharge: 2010-10-01 | Disposition: A | Discharge status: Home / Self Care | Payer: Medicare Other | Source: Ambulatory Visit | Attending: Internal Medicine | Admitting: Internal Medicine

## 2010-10-01 DIAGNOSIS — Z01818 Encounter for other preprocedural examination: Secondary | ICD-10-CM | POA: Insufficient documentation

## 2010-10-01 LAB — CBC
MCH: 32.2 pg (ref 26.0–34.0)
MCHC: 34.1 g/dL (ref 30.0–36.0)
MCV: 94.5 fL (ref 78.0–100.0)
Platelets: 327 10*3/uL (ref 150–400)
RDW: 13.5 % (ref 11.5–15.5)

## 2010-10-01 LAB — BASIC METABOLIC PANEL
Calcium: 9.7 mg/dL (ref 8.4–10.5)
Creatinine, Ser: 0.66 mg/dL (ref 0.50–1.35)
GFR calc non Af Amer: >60 mL/min (ref >60)
Sodium: 139 mEq/L (ref 135–145)

## 2010-10-01 NOTE — Patient Instructions (Signed)
[address]  10/01/2010   Your procedure is scheduled on:  10/04/2010  Report to [facility] at  745 AM.  Call this number if you have problems the morning of surgery: [phone]   Remember:   Do not eat food:After Midnight.  Do not drink clear liquids: After Midnight.  Take these medicines the morning of surgery with A SIP OF WATER: xanax,lorcet,prilosec   Do not wear jewelry, make-up or nail polish.  Do not wear lotions, powders, or perfumes. You may wear deodorant.  Do not shave 48 hours prior to surgery.  Do not bring valuables to the hospital.  Contacts, dentures or bridgework may not be worn into surgery.  Leave suitcase in the car. After surgery it may be brought to your room.  For patients admitted to the hospital, checkout time is 11:00 AM the day of discharge.   Patients discharged the day of surgery will not be allowed to drive home.  Name and phone number of your driver: family  Special Instructions: N/A   Please read over the following fact sheets that you were given: Pain Booklet, Surgical Site Infection Prevention, Anesthesia Post-op Instructions and Care and Recovery After Surgery PATIENT INSTRUCTIONS POST-ANESTHESIA  IMMEDIATELY FOLLOWING SURGERY:  Do not drive or operate machinery for the first twenty four hours after surgery.  Do not make any important decisions for twenty four hours after surgery or while taking narcotic pain medications or sedatives.  If you develop intractable nausea and vomiting or a severe headache please notify your doctor immediately.  FOLLOW-UP:  Please make an appointment with your surgeon as instructed. You do not need to follow up with anesthesia unless specifically instructed to do so.  WOUND CARE INSTRUCTIONS (if applicable):  Keep a dry clean dressing on the anesthesia/puncture wound site if there is drainage.  Once the wound has quit draining you may leave it open to air.  Generally you should leave the bandage intact for twenty four  hours unless there is drainage.  If the epidural site drains for more than 36-48 hours please call the anesthesia department.  QUESTIONS?:  Please feel free to call your physician or the hospital operator if you have any questions, and they will be happy to assist you.     [facility] Anesthesia Department [address] [place] [phone]

## 2010-10-04 ENCOUNTER — Encounter (HOSPITAL_COMMUNITY): Admission: RE | Payer: Self-pay | Source: Ambulatory Visit

## 2010-10-04 ENCOUNTER — Ambulatory Visit (HOSPITAL_COMMUNITY): Admission: RE | Admit: 2010-10-04 | Payer: Medicare Other | Source: Ambulatory Visit | Admitting: Internal Medicine

## 2010-10-04 SURGERY — COLONOSCOPY
Anesthesia: Monitor Anesthesia Care

## 2010-11-15 LAB — BASIC METABOLIC PANEL
Calcium: 9.2 mg/dL (ref 8.4–10.5)
Creatinine, Ser: 0.72 mg/dL (ref 0.4–1.5)
GFR calc Af Amer: >60 mL/min (ref >60)

## 2010-11-15 LAB — CBC
MCHC: 34.8 g/dL (ref 30.0–36.0)
RBC: 4.61 MIL/uL (ref 4.22–5.81)
WBC: 8.6 10*3/uL (ref 4.0–10.5)

## 2011-08-30 ENCOUNTER — Encounter (HOSPITAL_COMMUNITY): Payer: Self-pay | Admitting: *Deleted

## 2011-08-30 ENCOUNTER — Inpatient Hospital Stay (HOSPITAL_COMMUNITY)
Admission: EM | Admit: 2011-08-30 | Discharge: 2011-09-04 | DRG: 440 | Disposition: A | Discharge status: Home / Self Care | Payer: Medicare Other | Attending: Family Medicine | Admitting: Family Medicine

## 2011-08-30 DIAGNOSIS — Z79899 Other long term (current) drug therapy: Secondary | ICD-10-CM

## 2011-08-30 DIAGNOSIS — F410 Panic disorder [episodic paroxysmal anxiety] without agoraphobia: Secondary | ICD-10-CM | POA: Diagnosis present

## 2011-08-30 DIAGNOSIS — E876 Hypokalemia: Secondary | ICD-10-CM | POA: Diagnosis present

## 2011-08-30 DIAGNOSIS — G8929 Other chronic pain: Secondary | ICD-10-CM | POA: Diagnosis present

## 2011-08-30 DIAGNOSIS — Z7982 Long term (current) use of aspirin: Secondary | ICD-10-CM

## 2011-08-30 DIAGNOSIS — F1010 Alcohol abuse, uncomplicated: Secondary | ICD-10-CM | POA: Diagnosis present

## 2011-08-30 DIAGNOSIS — M549 Dorsalgia, unspecified: Secondary | ICD-10-CM | POA: Diagnosis present

## 2011-08-30 DIAGNOSIS — A630 Anogenital (venereal) warts: Secondary | ICD-10-CM | POA: Diagnosis present

## 2011-08-30 DIAGNOSIS — K859 Acute pancreatitis without necrosis or infection, unspecified: Principal | ICD-10-CM | POA: Diagnosis present

## 2011-08-30 DIAGNOSIS — K219 Gastro-esophageal reflux disease without esophagitis: Secondary | ICD-10-CM | POA: Diagnosis present

## 2011-08-30 DIAGNOSIS — R1013 Epigastric pain: Secondary | ICD-10-CM | POA: Diagnosis present

## 2011-08-30 DIAGNOSIS — F419 Anxiety disorder, unspecified: Secondary | ICD-10-CM | POA: Diagnosis present

## 2011-08-30 DIAGNOSIS — N400 Enlarged prostate without lower urinary tract symptoms: Secondary | ICD-10-CM | POA: Diagnosis present

## 2011-08-30 LAB — BASIC METABOLIC PANEL
CO2: 28 mEq/L (ref 19–32)
Calcium: 10 mg/dL (ref 8.4–10.5)
Chloride: 95 mEq/L — ABNORMAL LOW (ref 96–112)
Glucose, Bld: 96 mg/dL (ref 70–99)
Sodium: 135 mEq/L (ref 135–145)

## 2011-08-30 LAB — RAPID URINE DRUG SCREEN, HOSP PERFORMED
Benzodiazepines: POSITIVE — AB
Cocaine: NOT DETECTED
Opiates: POSITIVE — AB

## 2011-08-30 LAB — ETHANOL: Alcohol, Ethyl (B): <11 mg/dL (ref 0–11)

## 2011-08-30 LAB — URINALYSIS, ROUTINE W REFLEX MICROSCOPIC
Glucose, UA: NEGATIVE mg/dL
Ketones, ur: 15 mg/dL — AB
Leukocytes, UA: NEGATIVE
Nitrite: NEGATIVE
Specific Gravity, Urine: 1.02 (ref 1.005–1.030)
pH: 6 (ref 5.0–8.0)

## 2011-08-30 LAB — CBC WITH DIFFERENTIAL/PLATELET
Eosinophils Relative: 1 % (ref 0–5)
HCT: 48.3 % (ref 39.0–52.0)
Lymphocytes Relative: 13 % (ref 12–46)
Lymphs Abs: 1.7 10*3/uL (ref 0.7–4.0)
MCV: 93.1 fL (ref 78.0–100.0)
Neutro Abs: 10 10*3/uL — ABNORMAL HIGH (ref 1.7–7.7)
Platelets: 325 10*3/uL (ref 150–400)
RBC: 5.19 MIL/uL (ref 4.22–5.81)
WBC: 12.6 10*3/uL — ABNORMAL HIGH (ref 4.0–10.5)

## 2011-08-30 LAB — URINE MICROSCOPIC-ADD ON

## 2011-08-30 MED ORDER — FENTANYL CITRATE 0.05 MG/ML IJ SOLN
INTRAMUSCULAR | Status: AC
  Administered 2011-08-30: 50 ug
  Filled 2011-08-30: qty 2

## 2011-08-30 MED ORDER — SODIUM CHLORIDE 0.9 % IV BOLUS (SEPSIS)
1000.0000 mL | INTRAVENOUS | Status: AC
  Administered 2011-08-30: 1000 mL via INTRAVENOUS

## 2011-08-30 MED ORDER — SODIUM CHLORIDE 0.9 % IV SOLN: Freq: Once | INTRAVENOUS | Status: DC

## 2011-08-30 MED ORDER — HYDROMORPHONE HCL PF 1 MG/ML IJ SOLN
1.0000 mg | Freq: Once | INTRAMUSCULAR | Status: AC
  Administered 2011-08-30: 1 mg via INTRAVENOUS
  Filled 2011-08-30: qty 1

## 2011-08-30 MED ORDER — ONDANSETRON HCL 4 MG/2ML IJ SOLN
4.0000 mg | Freq: Once | INTRAMUSCULAR | Status: AC
  Administered 2011-08-30: 4 mg via INTRAVENOUS
  Filled 2011-08-30: qty 2

## 2011-08-30 NOTE — ED Notes (Addendum)
abd pain, vomiting,  Hx of pancreatitis, Etoh 2 weeks ago Dry heaves at triage.

## 2011-08-30 NOTE — ED Provider Notes (Signed)
History   This chart was scribed for [employer]. Colon Branch, MD by Shari Heritage. The patient was seen in room APA02/APA02. Patient's care was started at 2131.    CSN: [id number]  Arrival date & time 08/30/11  2131   First MD Initiated Contact with Patient 08/30/11 2258      Chief Complaint  Patient presents with  . Abdominal Pain    (Consider location/radiation/quality/duration/timing/severity/associated sxs/prior treatment) The history is provided by the patient. No language interpreter was used.   Brent [name] is a 41 y.o. male who presents to the Emergency Department complaining of constant, moderate to severe abdominal pain onset several days ago. Patient states that the pain has worsened significantly over the past two days. Patient was diagnosed with pancreatitis 7 years ago. Patient is an alcohol user who has been drinking at least 1 drink/day for the past two weeks. Patient should be taking Creon for pancreatitis, but he stopped because he did not believe that he actually has pancreatitis. Patient with medical h/o condylomata acuminata, GERD, chronic back pain, suicidal ideation and anxiety. Patient is a current every day smoker.    PCP - Sudie Bailey GI Dr. Karilyn Cota Past Medical History  Diagnosis Date  . Condylomata acuminata in male     multiple procedures  . GERD (gastroesophageal reflux disease)   . Chronic back pain   . Pancreatitis     H/O  . Suicidal ideation     Admission in 2006 for suicidal ideation without attempt  . Anxiety     Disabled due to panic attacks    Past Surgical History  Procedure Date  . Egd with ed by dr. Karilyn Cota 6/06    erosive RE with small sliding hh. HP neg, 32F  . Tcs by dr. Karilyn Cota 12/05    External hemorrhoids  . Electrocautery/dessication of condyloma of penis and perianal area 2009/2011    Dr. Leticia Penna    Family History  Problem Relation Age of Onset  . Lung cancer Father 52  . Colon cancer Neg Hx   . Colitis Neg Hx   . Cirrhosis Neg  Hx   . Liver disease Neg Hx   . Pancreatic cancer Neg Hx   . Pancreatitis Neg Hx   . Anesthesia problems Neg Hx   . Hypotension Neg Hx   . Malignant hyperthermia Neg Hx   . Pseudochol deficiency Neg Hx     History  Substance Use Topics  . Smoking status: Current Everyday Smoker -- 0.2 packs/day for 20 years    Types: Cigarettes  . Smokeless tobacco: Not on file  . Alcohol Use: Yes     . 2 weeks ago      Review of Systems  Constitutional: Negative for fever.       10 Systems reviewed and are negative for acute change except as noted in the HPI.  HENT: Negative for congestion.   Eyes: Negative for discharge and redness.  Respiratory: Negative for cough and shortness of breath.   Cardiovascular: Negative for chest pain.  Gastrointestinal: Positive for nausea and abdominal pain. Negative for vomiting.  Musculoskeletal: Negative for back pain.  Skin: Negative for rash.  Neurological: Negative for syncope, numbness and headaches.  Psychiatric/Behavioral:       No behavior change.     Allergies  Review of patient's allergies indicates no known allergies.  Home Medications   Current Outpatient Rx  Name Route Sig Dispense Refill  . ALPRAZOLAM 0.5 MG PO TABS Oral Take 0.5 mg  by mouth 4 (four) times daily.      . ASPIRIN-SALICYLAMIDE-CAFFEINE 325-95-16 MG PO TABS Oral Take 1 packet by mouth as needed. For pain    . CETIRIZINE HCL 10 MG PO TABS Oral Take 10 mg by mouth daily as needed. For allergies     . B-12 PO Oral Take 1 tablet by mouth daily.    Marland Kitchen HYDROCODONE-ACETAMINOPHEN 7.5-650 MG PO TABS Oral Take 1 tablet by mouth every 6 (six) hours as needed. For pain    . NAPHAZOLINE-GLYCERIN 0.012-0.2 % OP SOLN Both Eyes Place 1-2 drops into both eyes every 4 (four) hours as needed.    Marland Kitchen OMEPRAZOLE 40 MG PO CPDR Oral Take 40 mg by mouth daily.        BP 117/85  Pulse 102  Temp 99.5 F (37.5 C) (Oral)  Resp 20  Ht 5\' 7"  (1.702 m)  Wt 130 lb (58.968 kg)  BMI 20.36 kg/m2   SpO2 98%  Physical Exam  Constitutional: He is oriented to person, place, and time. He appears well-developed.  HENT:  Head: Normocephalic.  Cardiovascular: Normal rate and regular rhythm.   Pulmonary/Chest: Effort normal and breath sounds normal.  Abdominal: There is tenderness. There is guarding. There is no rebound.       Epigastric discomfort with palpation.  Musculoskeletal: Normal range of motion.  Neurological: He is alert and oriented to person, place, and time.  Skin: Skin is warm and dry.    ED Course  Procedures (including critical care time) DIAGNOSTIC STUDIES: Oxygen Saturation is 98% on room air, normal by my interpretation.    COORDINATION OF CARE: 11:03PM- Patient informed of current plan for treatment and evaluation and agrees with plan at this time. Will administer pain medication and order alcohol test, drug screen, blood lipase test, urinalysis, CBC and basic metabolic panel.  Results for orders placed during the hospital encounter of 08/30/11  CBC WITH DIFFERENTIAL      Component Value Range   WBC 12.6 (*) 4.0 - 10.5 K/uL   RBC 5.19  4.22 - 5.81 MIL/uL   Hemoglobin 16.8  13.0 - 17.0 g/dL   HCT 16.1  09.6 - 04.5 %   MCV 93.1  78.0 - 100.0 fL   MCH 32.4  26.0 - 34.0 pg   MCHC 34.8  30.0 - 36.0 g/dL   RDW 40.9  81.1 - 91.4 %   Platelets 325  150 - 400 K/uL   Neutrophils Relative 79 (*) 43 - 77 %   Neutro Abs 10.0 (*) 1.7 - 7.7 K/uL   Lymphocytes Relative 13  12 - 46 %   Lymphs Abs 1.7  0.7 - 4.0 K/uL   Monocytes Relative 6  3 - 12 %   Monocytes Absolute 0.7  0.1 - 1.0 K/uL   Eosinophils Relative 1  0 - 5 %   Eosinophils Absolute 0.1  0.0 - 0.7 K/uL   Basophils Relative 1  0 - 1 %   Basophils Absolute 0.1  0.0 - 0.1 K/uL  BASIC METABOLIC PANEL      Component Value Range   Sodium 135  135 - 145 mEq/L   Potassium 3.2 (*) 3.5 - 5.1 mEq/L   Chloride 95 (*) 96 - 112 mEq/L   CO2 28  19 - 32 mEq/L   Glucose, Bld 96  70 - 99 mg/dL   BUN 6  6 - 23 mg/dL    Creatinine, Ser 7.82  0.50 - 1.35 mg/dL  Calcium 10.0  8.4 - 10.5 mg/dL   GFR calc non Af Amer >90  >90 mL/min   GFR calc Af Amer >90  >90 mL/min  LIPASE, BLOOD      Component Value Range   Lipase 133 (*) 11 - 59 U/L  URINALYSIS, ROUTINE W REFLEX MICROSCOPIC      Component Value Range   Color, Urine AMBER (*) YELLOW   APPearance CLEAR  CLEAR   Specific Gravity, Urine 1.020  1.005 - 1.030   pH 6.0  5.0 - 8.0   Glucose, UA NEGATIVE  NEGATIVE mg/dL   Hgb urine dipstick NEGATIVE  NEGATIVE   Bilirubin Urine SMALL (*) NEGATIVE   Ketones, ur 15 (*) NEGATIVE mg/dL   Protein, ur TRACE (*) NEGATIVE mg/dL   Urobilinogen, UA 0.2  0.0 - 1.0 mg/dL   Nitrite NEGATIVE  NEGATIVE   Leukocytes, UA NEGATIVE  NEGATIVE  ETHANOL      Component Value Range   Alcohol, Ethyl (B) <11  0 - 11 mg/dL  URINE RAPID DRUG SCREEN (HOSP PERFORMED)      Component Value Range   Opiates POSITIVE (*) NONE DETECTED   Cocaine NONE DETECTED  NONE DETECTED   Benzodiazepines POSITIVE (*) NONE DETECTED   Amphetamines NONE DETECTED  NONE DETECTED   Tetrahydrocannabinol NONE DETECTED  NONE DETECTED   Barbiturates NONE DETECTED  NONE DETECTED  URINE MICROSCOPIC-ADD ON      Component Value Range   Squamous Epithelial / LPF RARE  RARE   WBC, UA 3-6  <3 WBC/hpf   RBC / HPF 3-6  <3 RBC/hpf   Bacteria, UA FEW (*) RARE   Casts HYALINE CASTS (*) NEGATIVE     1:54 AM:  T/C to Dr. Onalee Hua, hospitalist, case discussed, including:  HPI, pertinent PM/SHx, VS/PE, dx testing, ED course and treatment.  Agreeable to admission.  Requests to write temporary orders, med surg  bed to team 1.   MDM  Patient with pancreatitis who continues to drink, here with abdominal pain similar to previous pancreatitis. Given analgesic with minimal relief. Hepatic panel and CT abd/pelvis pending. Remaining labs with slightly elevated wbc, elevated lipase c/w pancreatitis. Arrangement for admission. Spoke with Dr. Onalee Hua, hospitalist who will admit  patient. Pt stable in ED with no significant deterioration in condition.The patient appears reasonably stabilized for admission considering the current resources, flow, and capabilities available in the ED at this time, and I doubt any other [facility] requiring further screening and/or treatment in the ED prior to admission.  I personally performed the services described in this documentation, which was scribed in my presence. The recorded information has been reviewed and considered.   MDM Reviewed: nursing note, vitals and previous chart Interpretation: labs           Nicoletta Dress. Colon Branch, MD 08/31/11 [phone]

## 2011-08-31 ENCOUNTER — Emergency Department (HOSPITAL_COMMUNITY): Payer: Medicare Other

## 2011-08-31 ENCOUNTER — Encounter (HOSPITAL_COMMUNITY): Payer: Self-pay | Admitting: *Deleted

## 2011-08-31 DIAGNOSIS — F419 Anxiety disorder, unspecified: Secondary | ICD-10-CM | POA: Diagnosis present

## 2011-08-31 DIAGNOSIS — M549 Dorsalgia, unspecified: Secondary | ICD-10-CM

## 2011-08-31 DIAGNOSIS — F1010 Alcohol abuse, uncomplicated: Secondary | ICD-10-CM

## 2011-08-31 DIAGNOSIS — F411 Generalized anxiety disorder: Secondary | ICD-10-CM

## 2011-08-31 DIAGNOSIS — G8929 Other chronic pain: Secondary | ICD-10-CM

## 2011-08-31 DIAGNOSIS — K859 Acute pancreatitis without necrosis or infection, unspecified: Principal | ICD-10-CM

## 2011-08-31 LAB — CBC
HCT: 40.2 % (ref 39.0–52.0)
Hemoglobin: 14 g/dL (ref 13.0–17.0)
MCHC: 34.8 g/dL (ref 30.0–36.0)
RBC: 4.27 MIL/uL (ref 4.22–5.81)

## 2011-08-31 LAB — COMPREHENSIVE METABOLIC PANEL
ALT: 10 U/L (ref 0–53)
Calcium: 8.1 mg/dL — ABNORMAL LOW (ref 8.4–10.5)
Creatinine, Ser: 0.75 mg/dL (ref 0.50–1.35)
GFR calc Af Amer: >90 mL/min (ref >90)
GFR calc non Af Amer: >90 mL/min (ref >90)
Glucose, Bld: 116 mg/dL — ABNORMAL HIGH (ref 70–99)
Sodium: 136 mEq/L (ref 135–145)
Total Protein: 5.2 g/dL — ABNORMAL LOW (ref 6.0–8.3)

## 2011-08-31 LAB — GLUCOSE, CAPILLARY: Glucose-Capillary: 74 mg/dL (ref 70–99)

## 2011-08-31 LAB — MRSA PCR SCREENING: MRSA by PCR: NEGATIVE

## 2011-08-31 LAB — PROTIME-INR
INR: 1.17 (ref 0.00–1.49)
Prothrombin Time: 15.1 seconds (ref 11.6–15.2)

## 2011-08-31 LAB — HIV ANTIBODY (ROUTINE TESTING W REFLEX): HIV: NONREACTIVE

## 2011-08-31 MED ORDER — SODIUM CHLORIDE 0.9 % IV SOLN: INTRAVENOUS | Status: DC

## 2011-08-31 MED ORDER — ONDANSETRON HCL 4 MG PO TABS: 4.0000 mg | ORAL_TABLET | Freq: Four times a day (QID) | ORAL | Status: DC | PRN

## 2011-08-31 MED ORDER — POTASSIUM CHLORIDE CRYS ER 20 MEQ PO TBCR
20.0000 meq | EXTENDED_RELEASE_TABLET | Freq: Two times a day (BID) | ORAL | Status: DC
  Administered 2011-08-31 – 2011-09-01 (×3): 20 meq via ORAL
  Filled 2011-08-31 (×3): qty 1

## 2011-08-31 MED ORDER — MORPHINE SULFATE 2 MG/ML IJ SOLN
2.0000 mg | INTRAMUSCULAR | Status: DC | PRN
  Administered 2011-08-31 – 2011-09-04 (×18): 2 mg via INTRAVENOUS
  Filled 2011-08-31 (×20): qty 1

## 2011-08-31 MED ORDER — ONDANSETRON HCL 4 MG/2ML IJ SOLN
4.0000 mg | Freq: Four times a day (QID) | INTRAMUSCULAR | Status: DC | PRN
  Administered 2011-08-31: 4 mg via INTRAVENOUS
  Filled 2011-08-31: qty 2

## 2011-08-31 MED ORDER — LORAZEPAM 2 MG/ML IJ SOLN
1.0000 mg | Freq: Four times a day (QID) | INTRAMUSCULAR | Status: DC | PRN
  Administered 2011-08-31 – 2011-09-04 (×15): 1 mg via INTRAVENOUS
  Filled 2011-08-31 (×15): qty 1

## 2011-08-31 MED ORDER — LORAZEPAM 2 MG/ML IJ SOLN
1.0000 mg | Freq: Once | INTRAMUSCULAR | Status: AC
  Administered 2011-08-31: 1 mg via INTRAVENOUS

## 2011-08-31 MED ORDER — DEXTROSE-NACL 5-0.9 % IV SOLN
INTRAVENOUS | Status: DC
  Administered 2011-08-31 – 2011-09-03 (×6): via INTRAVENOUS

## 2011-08-31 MED ORDER — LORAZEPAM 2 MG/ML IJ SOLN
INTRAMUSCULAR | Status: AC
  Filled 2011-08-31: qty 1

## 2011-08-31 MED ORDER — POTASSIUM CHLORIDE IN NACL 20-0.9 MEQ/L-% IV SOLN
INTRAVENOUS | Status: DC
  Administered 2011-08-31 (×2): via INTRAVENOUS

## 2011-08-31 MED ORDER — HYDROMORPHONE HCL PF 1 MG/ML IJ SOLN
1.0000 mg | Freq: Once | INTRAMUSCULAR | Status: AC
  Administered 2011-08-31: 1 mg via INTRAVENOUS
  Filled 2011-08-31: qty 1

## 2011-08-31 MED ORDER — IOHEXOL 300 MG/ML  SOLN
100.0000 mL | Freq: Once | INTRAMUSCULAR | Status: AC | PRN
  Administered 2011-08-31: 100 mL via INTRAVENOUS

## 2011-08-31 MED ORDER — ONDANSETRON HCL 4 MG/2ML IJ SOLN
INTRAMUSCULAR | Status: AC
  Administered 2011-08-31: 4 mg
  Filled 2011-08-31: qty 2

## 2011-08-31 NOTE — Progress Notes (Signed)
NAMECLAUDIE, RATHBONE                ACCOUNT NO.:  [id number]  MEDICAL RECORD NO.:  [id number]  LOCATION:  A306                          FACILITY:  [facility]  PHYSICIAN:  Mila Homer. Sudie Bailey, M.D.DATE OF BIRTH:  04/25/1970  DATE OF PROCEDURE: DATE OF DISCHARGE:                                PROGRESS NOTE   SUBJECTIVE:  Brent [name] was admitted in the middle of last night by the hospitalist for pancreatitis, probably alcohol induced.  He tells me that he had stopped drinking alcohol, but then started drinking 1 to 3 beers a week, then about 2 weeks ago began to have abdominal pain, similar to the last time he had pancreatitis, so he stopped alcohol 2 weeks ago but had persistent pain and so presented to the emergency room.  He is still having pain today but may be somewhat better.  OBJECTIVE:  He is able to walk in the room.  He is in distress due to abdominal pain.  Temperature is 99.1, pulse 81, respiratory rate 18, blood pressure 112/72.  His heart has a regular rhythm, rate of 80.  His lungs are clear throughout.  His color is good.  Speech is normal.  He does have tenderness in the epigastrium on deep palpation.  There is no tenderness in the rest of the abdomen.  Today, his potassium is 2.8, down from 3.2 on admission.  He has an albumin 3.1.  Lipase was 133 yesterday.  His white cell count today is 10,600, down from 12,600.  His UA was essentially negative.  A CT of the abdomen and pelvis showed peripancreatic edema and fullness of the head of the pancreas.  He also had a mildly enlarged prostate gland.  He had a right renal stone but that was nonobstructing.  ASSESSMENT: 1. Alcoholic pancreatitis. 2. Ethyl alcohol use. 3. Prostate enlargement. 4. Anxiety. 5. Hypokalemia.  PLAN:  He is already on potassium in his IV fluids but will add 20 mEq b.i.d. and recheck his potassium level tomorrow.  He will continue on IV fluids and clear liquids.    Mila Homer. Sudie Bailey,  M.D.    SDK/MEDQ  D:  08/31/2011  T:  08/31/2011  Job:  [id number]

## 2011-08-31 NOTE — H&P (Signed)
PCP:   Milana Obey, MD   Chief Complaint:  Abdominal pain  HPI: 41 year old male who has a history of pancreatitis in the past who comes in with over week of progressive worsening epigastric abdominal pain with nausea and vomiting. His pancreatitis has been associated with alcohol use in the past. He rarely drinks however he did drink several beers over a week ago which probably triggered this episode. He does take pain medications at home for chronic back pain but he has not been able to hold those down. Denies any fevers. Denies any diarrhea. He denies any blood in his vomit. He's not had any new medications recently.  Review of Systems:  Otherwise negative  Past Medical History: Past Medical History  Diagnosis Date  . Condylomata acuminata in male     multiple procedures  . GERD (gastroesophageal reflux disease)   . Chronic back pain   . Pancreatitis     H/O  . Suicidal ideation     Admission in 2006 for suicidal ideation without attempt  . Anxiety     Disabled due to panic attacks   Past Surgical History  Procedure Date  . Egd with ed by dr. Karilyn Cota 6/06    erosive RE with small sliding hh. HP neg, 52F  . Tcs by dr. Karilyn Cota 12/05    External hemorrhoids  . Electrocautery/dessication of condyloma of penis and perianal area 2009/2011    Dr. Leticia Penna    Medications: Prior to Admission medications   Medication Sig Start Date End Date Taking? Authorizing Provider  ALPRAZolam Prudy Feeler) 0.5 MG tablet Take 0.5 mg by mouth 4 (four) times daily.   05/03/10  Yes Historical Provider, MD  Aspirin-Salicylamide-Caffeine (BC HEADACHE) 325-95-16 MG TABS Take 1 packet by mouth as needed. For pain   Yes Historical Provider, MD  cetirizine (ZYRTEC) 10 MG tablet Take 10 mg by mouth daily as needed. For allergies    Yes Historical Provider, MD  Cyanocobalamin (B-12 PO) Take 1 tablet by mouth daily.   Yes Historical Provider, MD  hydrocodone-acetaminophen (LORCET PLUS) 7.5-650 MG per  tablet Take 1 tablet by mouth every 6 (six) hours as needed. For pain 05/03/10  Yes Historical Provider, MD  naphazoline-glycerin (CLEAR EYES REDNESS RELIEF) 0.012-0.2 % SOLN Place 1-2 drops into both eyes every 4 (four) hours as needed.   Yes Historical Provider, MD  omeprazole (PRILOSEC) 40 MG capsule Take 40 mg by mouth daily.     Yes Historical Provider, MD    Allergies:  No Known Allergies  Social History:  reports that he has been smoking Cigarettes.  He has a 5 pack-year smoking history. He does not have any smokeless tobacco history on file. He reports that he drinks alcohol. He reports that he does not use illicit drugs.  Family History: Family History  Problem Relation Age of Onset  . Lung cancer Father 79  . Colon cancer Neg Hx   . Colitis Neg Hx   . Cirrhosis Neg Hx   . Liver disease Neg Hx   . Pancreatic cancer Neg Hx   . Pancreatitis Neg Hx   . Anesthesia problems Neg Hx   . Hypotension Neg Hx   . Malignant hyperthermia Neg Hx   . Pseudochol deficiency Neg Hx     Physical Exam: Filed Vitals:   08/30/11 2138 08/30/11 2141 08/31/11 0234 08/31/11 0318  BP:  117/85 105/69 112/72  Pulse:  102 78 81  Temp:  99.5 F (37.5 C)  99.1 F (37.3  C)  TempSrc:  Oral  Oral  Resp:  20 20 18   Height: 5\' 7"  (1.702 m)   5\' 7"  (1.702 m)  Weight: 58.968 kg (130 lb)   51.483 kg (113 lb 8 oz)  SpO2:  98% 98% 95%   General appearance: alert, cooperative and no distress Lungs: clear to auscultation bilaterally Heart: regular rate and rhythm, S1, S2 normal, no murmur, click, rub or gallop Abdomen: soft ttp epi area nd pos bs no r/g nonacute abd exam Extremities: extremities normal, atraumatic, no cyanosis or edema Pulses: 2+ and symmetric Skin: Skin color, texture, turgor normal. No rashes or lesions Neurologic: Grossly normal    Labs on Admission:   [facility] 08/30/11 2206  NA 135  K 3.2*  CL 95*  CO2 28  GLUCOSE 96  BUN 6  CREATININE 0.85  CALCIUM 10.0  MG --  PHOS  --    Basename 08/30/11 2222  LIPASE 133*  AMYLASE --    Basename 08/30/11 2206  WBC 12.6*  NEUTROABS 10.0*  HGB 16.8  HCT 48.3  MCV 93.1  PLT 325   Radiological Exams on Admission: Ct Abdomen Pelvis W Contrast  08/31/2011  *RADIOLOGY REPORT*  Clinical Data: Abdominal pain.  Nausea and vomiting.  History of pancreatitis.  CT ABDOMEN AND PELVIS WITH CONTRAST  Technique:  Multidetector CT imaging of the abdomen and pelvis was performed following the standard protocol during bolus administration of intravenous contrast.  Contrast: OMNIPAQUE IOHEXOL 300 MG/ML  SOLN  Comparison: 05/09/2010  Findings: Atelectasis and fibrosis in the lung bases.  The liver, spleen, gallbladder, adrenal glands, abdominal aorta, and retroperitoneal lymph nodes are unremarkable.  There is mild fullness in the region of the head of the pancreas with a small amount of fluid around the head of the pancreas extending to the body.  Mild pancreatic ductal dilatation.  Changes are compatible with early stage of acute pancreatitis.  No evidence of pancreatic necrosis or abscess.  No focal peripancreatic fluid collection. Punctate nonobstructing stone in the midportion of the right kidney.  No hydronephrosis in either kidney.  Sub centimeter parenchymal cysts.  The stomach, small bowel, and colon are not abnormally distended.  No free air in the abdomen.  Pelvis: Mild enlargement of the prostate gland, measuring 3.4 x 5.3 cm.  Bladder wall is not thickened.  No free or loculated pelvic fluid collections.  No evidence of diverticulitis.  The appendix is not specifically identified.  No significant pelvic lymphadenopathy.  Mild degenerative changes in the lumbar spine.  IMPRESSION: Fullness in the head of the pancreas with peripancreatic edema and mild pancreatic ductal dilatation suggesting early changes of acute pancreatitis.  Nonobstructing right renal stone.  Original Report Authenticated By: Marlon Pel, M.D.     Assessment/Plan Present on Admission:  40 year old male with acute or chronic pancreatitis likely secondary to recurrent alcohol use  .Epigastric pain .Pancreatitis .ETOH abuse .Anxiety .Chronic back pain  Conservative management. Place on IV morphine provide IV Ativan for his chronic anxiety issues. N.p.o. except ice chips and IV fluids. He's been encouraged to never drink alcohol again he understands this. PCP is Dr. Sudie Bailey.  Kadeja Granada [phone] 08/31/2011, 3:36 AM

## 2011-08-31 NOTE — Progress Notes (Signed)
INITIAL ADULT NUTRITION ASSESSMENT Date: 08/31/2011   Time: 1:16 PM Reason for Assessment: nutrition risk (unintentional weight loss)  ASSESSMENT: Male 41 y.o.  Dx: Pancreatitis  Hx:  Past Medical History  Diagnosis Date  . Condylomata acuminata in male     multiple procedures  . GERD (gastroesophageal reflux disease)   . Chronic back pain   . Pancreatitis     H/O  . Suicidal ideation     Admission in 2006 for suicidal ideation without attempt  . Anxiety     Disabled due to panic attacks   Past Surgical History  Procedure Date  . Egd with ed by dr. Karilyn Cota 6/06    erosive RE with small sliding hh. HP neg, 38F  . Tcs by dr. Karilyn Cota 12/05    External hemorrhoids  . Electrocautery/dessication of condyloma of penis and perianal area 2009/2011    Dr. Leticia Penna   Related Meds:  Scheduled Meds:   . fentaNYL      .  HYDROmorphone (DILAUDID) injection  1 mg Intravenous Once  .  HYDROmorphone (DILAUDID) injection  1 mg Intravenous Once  .  HYDROmorphone (DILAUDID) injection  1 mg Intravenous Once  . ondansetron      . ondansetron  4 mg Intravenous Once  . sodium chloride  1,000 mL Intravenous STAT  . DISCONTD: sodium chloride   Intravenous Once  . DISCONTD: sodium chloride   Intravenous STAT   Continuous Infusions:   . 0.9 % NaCl with KCl 20 mEq / L 125 mL/hr at 08/31/11 1326   PRN Meds:.iohexol, LORazepam, morphine injection, ondansetron (ZOFRAN) IV, ondansetron  Ht: 5\' 7"  (170.2 cm)  Wt: 113 lb 8 oz (51.483 kg)  Ideal Wt:  66.1 kg % Ideal Wt: 76%  Usual Wt: unknown % Usual Wt: unknown  Body mass index is 17.78 kg/(m^2). Classified as underweight.   Food/Nutrition Related Hx: Pt admitted with vomiting and abdominal pain, due to pancreatitis. Pt has been drinking at least 1 alcoholic drink daily for the past 2 weeks. Drug screen tested positive for opiates and benzodiazepines. ER notes report that pt may be in denial of his condition.  Noted pt was 125# on 09/24/10.  Pt has lost 12# (9.6%) x 1 year. While pt does not meet criteria for malnutrition, he is at high risk for malnutrition given hx of weight loss, underweight status, and drug use.   Labs:  CMP     Component Value Date/Time   NA 136 08/31/2011 0503   K 2.8* 08/31/2011 0503   CL 103 08/31/2011 0503   CO2 27 08/31/2011 0503   GLUCOSE 116* 08/31/2011 0503   BUN 5* 08/31/2011 0503   CREATININE 0.75 08/31/2011 0503   CREATININE 0.74 05/09/2010 1517   CALCIUM 8.1* 08/31/2011 0503   PROT 5.2* 08/31/2011 0503   ALBUMIN 3.1* 08/31/2011 0503   AST 12 08/31/2011 0503   AST 14 05/09/2010 1517   ALT 10 08/31/2011 0503   ALKPHOS 66 08/31/2011 0503   ALKPHOS 69 05/09/2010 1517   BILITOT 0.5 08/31/2011 0503   GFRNONAA >90 08/31/2011 0503   GFRAA >90 08/31/2011 0503     Intake: No intake or output data in the 24 hours ending 08/31/11 1357  Diet Order: NPO  Supplements/Tube Feeding: none at this time  IVF:    0.9 % NaCl with KCl 20 mEq / L Last Rate: 125 mL/hr at 08/31/11 1326    Estimated Nutritional Needs:   Kcal:1541-1798 kcals daily Protein:62-77 grams protein daily Fluid:1.5-1.8  L fluid daily  NUTRITION DIAGNOSIS: -Inadequate oral intake (NI-2.1).  Status: Ongoing  RELATED TO: vomiting, abdominal pain, bowel rest  AS EVIDENCE BY: NPO, 12# (9.6%) weight loss x 1 year.   MONITORING/EVALUATION(Goals): 1) Pt will maintain current weight of 113# 2) Pt will be advance to a PO diet as medically indicated 3) Pt will meet >75% of estimated energy needs  EDUCATION NEEDS: -Education not appropriate at this time  INTERVENTION: Will follow for diet advancement. Add supplements with diet advancement.   Dietitian #: [phone]  DOCUMENTATION CODES Per approved criteria  -Underweight    Zamauri Nez A. Kayan, RD, LDN 08/31/2011, 1:16 PM

## 2011-09-01 LAB — BASIC METABOLIC PANEL
BUN: 3 mg/dL — ABNORMAL LOW (ref 6–23)
Calcium: 8.7 mg/dL (ref 8.4–10.5)
Chloride: 105 mEq/L (ref 96–112)
Creatinine, Ser: 0.62 mg/dL (ref 0.50–1.35)
GFR calc Af Amer: >90 mL/min (ref >90)
GFR calc non Af Amer: >90 mL/min (ref >90)

## 2011-09-01 LAB — LIPASE, BLOOD: Lipase: 40 U/L (ref 11–59)

## 2011-09-01 MED ORDER — POTASSIUM CHLORIDE CRYS ER 20 MEQ PO TBCR
40.0000 meq | EXTENDED_RELEASE_TABLET | Freq: Two times a day (BID) | ORAL | Status: DC
  Administered 2011-09-01 – 2011-09-04 (×6): 40 meq via ORAL
  Filled 2011-09-01 (×6): qty 2

## 2011-09-01 MED ORDER — SODIUM CHLORIDE 0.9 % IJ SOLN
INTRAMUSCULAR | Status: AC
  Administered 2011-09-01: 16:00:00
  Filled 2011-09-01: qty 3

## 2011-09-01 MED ORDER — SODIUM CHLORIDE 0.9 % IJ SOLN
INTRAMUSCULAR | Status: AC
  Administered 2011-09-01: 10 mL
  Filled 2011-09-01: qty 3

## 2011-09-01 NOTE — Progress Notes (Signed)
NAMEAASHRITH, EVES                ACCOUNT NO.:  [id number]  MEDICAL RECORD NO.:  [id number]  LOCATION:  A306                          FACILITY:  [facility]  PHYSICIAN:  Mila Homer. Sudie Bailey, M.D.DATE OF BIRTH:  1970-11-10  DATE OF PROCEDURE: DATE OF DISCHARGE:                                PROGRESS NOTE   SUBJECTIVE:  He feels somewhat better.  OBJECTIVE:  VITAL SIGNS:  Temperature is 98.8, pulse 67, respiratory rate 18, blood pressure 102/65. GENERAL:  He is well developed, well nourished, appears in no acute distress today.  His color is good. ABDOMEN:  His abdomen is soft, but he still has tenderness on palpation of the upper abdomen. SKIN:  Skin turgor is good.  He has family in the room with him.  His potassium was 3.0 today, up from 2.8 yesterday.  His lipase was 40, down from 133.  ASSESSMENT: 1. Alcoholic pancreatitis. 2. Ethyl alcohol use, chronic. 3. Hypokalemia. 4. Anxiety.  PLAN: 1. Will increase his potassium from 20 mEq b.i.d. to 40 mEq b.i.d. 2. I will continue his IV fluids at 100 mL an hour, and recheck a BMP     in the morning.  We again discussed alcohol use.     Mila Homer. Sudie Bailey, M.D.     SDK/MEDQ  D:  09/01/2011  T:  09/01/2011  Job:  [id number]

## 2011-09-02 LAB — BASIC METABOLIC PANEL
BUN: <3 mg/dL — ABNORMAL LOW (ref 6–23)
Calcium: 8.9 mg/dL (ref 8.4–10.5)
Chloride: 106 mEq/L (ref 96–112)
Creatinine, Ser: 0.62 mg/dL (ref 0.50–1.35)
GFR calc Af Amer: >90 mL/min (ref >90)
GFR calc non Af Amer: >90 mL/min (ref >90)

## 2011-09-02 LAB — MAGNESIUM: Magnesium: 1.9 mg/dL (ref 1.5–2.5)

## 2011-09-02 NOTE — Progress Notes (Signed)
UR Chart Review Completed  

## 2011-09-02 NOTE — Care Management Note (Signed)
    Page 1 of 1   09/04/2011     10:55:36 AM   CARE MANAGEMENT NOTE 09/04/2011  Patient:  Brent [name], Brent [name]   Account Number:  [id number]  Date Initiated:  09/02/2011  Documentation initiated by:  Sharrie Rothman  Subjective/Objective Assessment:   Pt admitted from home with pancreatitis. Pt lives with family and will return home with them at discharge. Pt is independent with ADL's.     Action/Plan:   No CM needs noted. CSW is aware of pts ETOH abuse.   Anticipated DC Date:  09/04/2011   Anticipated DC Plan:  HOME/SELF CARE      DC Planning Services  CM consult      Choice offered to / List presented to:             Status of service:  Completed, signed off Medicare Important Message given?   (If response is "NO", the following Medicare IM given date fields will be blank) Date Medicare IM given:   Date Additional Medicare IM given:    Discharge Disposition:  HOME/SELF CARE  Per UR Regulation:    If discussed at Long Length of Stay Meetings, dates discussed:    Comments:  09/04/11 1054 Arlyss Queen, RN BSN CM Pt discharged home today. No CM needs noted.  09/02/11 1323 Arlyss Queen, RN BSN CM

## 2011-09-02 NOTE — Progress Notes (Signed)
Brent [name], Brent [name]                ACCOUNT NO.:  [id number]  MEDICAL RECORD NO.:  [id number]  LOCATION:  A306                          FACILITY:  [facility]  PHYSICIAN:  Mila Homer. Sudie Bailey, M.D.DATE OF BIRTH:  06/06/1970  DATE OF PROCEDURE: DATE OF DISCHARGE:                                PROGRESS NOTE   SUBJECTIVE:  He is feeling somewhat better than yesterday.  OBJECTIVE:  VITAL SIGNS:  Temperature 98.1, pulse 71, respiratory rate 18, blood pressure 113/66. GENERAL:  He is supine in bed. ABDOMEN:  He still has some upper abdominal tenderness on palpation. EXTREMITIES:  There is no edema.  The potassium is still low at 3.0 today.  ASSESSMENT: 1. Acute alcoholic pancreatitis. 2. Chronic ethyl alcohol use. 3. Hypokalemia.  PLAN:  Continue with potassium by vein and by mouth.  Clear liquids to include water and sugar today.  Magnesium level is pending.  Continue inpatient status.     Mila Homer. Sudie Bailey, M.D.     SDK/MEDQ  D:  09/02/2011  T:  09/02/2011  Job:  [id number]

## 2011-09-03 LAB — BASIC METABOLIC PANEL
CO2: 26 mEq/L (ref 19–32)
Chloride: 99 mEq/L (ref 96–112)
Creatinine, Ser: 0.37 mg/dL — ABNORMAL LOW (ref 0.50–1.35)
Glucose, Bld: 96 mg/dL (ref 70–99)
Sodium: 135 mEq/L (ref 135–145)

## 2011-09-03 MED ORDER — SODIUM CHLORIDE 0.9 % IJ SOLN
INTRAMUSCULAR | Status: AC
  Administered 2011-09-03: 3 mL
  Filled 2011-09-03: qty 3

## 2011-09-03 MED ORDER — SODIUM CHLORIDE 0.9 % IJ SOLN
INTRAMUSCULAR | Status: AC
  Administered 2011-09-03: 10 mL
  Filled 2011-09-03: qty 3

## 2011-09-03 NOTE — Progress Notes (Signed)
Brent [name]                ACCOUNT NO.:  [id number]  MEDICAL RECORD NO.:  [id number]  LOCATION:                                 FACILITY:  PHYSICIAN:  Brent [name], M.D.DATE OF BIRTH:  12-11-1970  DATE OF PROCEDURE: DATE OF DISCHARGE:                                PROGRESS NOTE   SUBJECTIVE:  His pain is much better today.  OBJECTIVE:  He is supine in bed. VITAL SIGNS:  Temp is 98.4, pulse 64, respiratory rate 20, blood pressure 104/69. GENERAL:  He is well-developed, well-nourished. SKIN:  Turgor is good. HEENT:  Mucous membranes moist. ABDOMEN:  Nontender on palpation.  His potassium is 3.3, up from 3.0 yesterday and his magnesium was 1.9.  His fluid balance showed +3500 yesterday and 1800 the day before.  ASSESSMENT:  Pancreatitis, much improved.  PLAN:  Will discontinue IV, but maintain a saline lock and switch him to a regular diet with plan for discharge home tomorrow if tolerated.     Brent [name], M.D.     SDK/MEDQ  D:  09/03/2011  T:  09/03/2011  Job:  [id number]

## 2011-09-04 LAB — BASIC METABOLIC PANEL
BUN: 4 mg/dL — ABNORMAL LOW (ref 6–23)
CO2: 28 mEq/L (ref 19–32)
Glucose, Bld: 92 mg/dL (ref 70–99)
Potassium: 3.7 mEq/L (ref 3.5–5.1)
Sodium: 137 mEq/L (ref 135–145)

## 2011-09-04 MED ORDER — SODIUM CHLORIDE 0.9 % IJ SOLN
INTRAMUSCULAR | Status: AC
  Filled 2011-09-04: qty 3

## 2011-09-04 MED ORDER — SODIUM CHLORIDE 0.9 % IJ SOLN
INTRAMUSCULAR | Status: AC
  Administered 2011-09-04: 10:00:00
  Filled 2011-09-04: qty 3

## 2011-09-04 NOTE — Discharge Summary (Signed)
NAMEJURIEL, Brent [name]                ACCOUNT NO.:  [id number]  MEDICAL RECORD NO.:  [id number]  LOCATION:  A306                          FACILITY:  [facility]  PHYSICIAN:  Mila Homer. Sudie Bailey, M.D.DATE OF BIRTH:  1970-06-24  DATE OF ADMISSION:  08/30/2011 DATE OF DISCHARGE:  LH                              DISCHARGE SUMMARY   This 41 year old was admitted to the hospital with alcoholic pancreatitis.  He has had a 5 day hospitalization.  Vital signs remained stable.  His admission white cell count was 12,600, with a slight shift to the left.  His admission BMP showed a potassium 3.2 and chloride 95,.  During hospitalization potassium dropped to 2.8 but eventually came up to 3.7 by discharge.  Recheck white cell count 10,600 with a normal platelet count and hemoglobin.  His admission UA showed 3-6 WBCs, 3-6 RBCs per HPF, and specific gravity 1.020.  His urine drug screen was positive for benzodiazepines and opiates, both of which he takes outpatient.  MRSA by PCR was negative.  CT abdomen and pelvis with contrast showed atelectasis and fibrosis at the lung bases.  He had fullness in the head of the pancreas with peripancreatic edema and mild pancreatic ductal dilatation, suggesting acute pancreatitis.  He had nonobstructing right renal stone, and also mild enlargement of the prostate gland, which measures 3.4 x 5.3 cm.  He was admitted to the hospital.  He initially was on IV fluids with normal saline at 125 mL an hour.  He was put on and IV pain meds and antiemetics.  Within several days, he was able to be switched to clear liquids.  His abdominal pain gradually improved such that the day before discharge it was improved enough so that he was put on a regular diet. IV was discontinued at that point.  He did well over the course of the day and was ready for discharge home his fifth hospital day, September 04, 2011.  FINAL DISCHARGE DIAGNOSES: 1. Acute alcoholic pancreatitis. 2. Chronic  ethyl alcohol use. 3. Hypokalemia, resolved. 4. Chronic anxiety. 5. Chronic low back pain. He is discharged home on his admission meds.  These include: 1. Alprazolam 0.5 mg q.i.d. 2. Cetirizine 10 mg daily. 3. Cyanocobalamin 1000 mcg daily. 4. Hydrocodone/APAP 7.5/650 q.6 hours for pain. 5. Omeprazole 40 mg daily. 6. Clear eye redness relief for his eyes p.r.n. 7. BC headaches p.r.n. I recommended that he eat a healthy diet.  We discussed this.  I did not think it was necessary to send home on potassium at this point.  He will be followed up in the office in 1 week at which point we will recheck a potassium level.     Mila Homer. Sudie Bailey, M.D.     SDK/MEDQ  D:  09/04/2011  T:  09/04/2011  Job:  [id number]

## 2011-09-26 ENCOUNTER — Encounter (HOSPITAL_COMMUNITY): Payer: Self-pay | Admitting: Pharmacy Technician

## 2011-09-26 NOTE — H&P (Signed)
  NTS SOAP Note  Vital Signs:  Vitals as of: 09/24/2011: Systolic 117: Diastolic 69: Heart Rate 86: Temp 98.62F: Height 22ft 7in: Weight 112Lbs 0 Ounces: Pain Level 5: BMI 18  BMI : 17.54 kg/m2  Subjective: This 41 Years 34 Months old Male presents for of rectal bleeding.  Has had intermittent episodes of blood per rectum.  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  Made worse when wiping himself.  Never has had a TCS.  No family h/o colon cancer.  Review of Symptoms:  Constitutional:unremarkable   Head:unremarkable    Eyes:unremarkable   Nose/Mouth/Throat:unremarkable Cardiovascular:  unremarkable   Respiratory:unremarkable   Genitourinary:unremarkable     Musculoskeletal:unremarkable   Skin:unremarkable Hematolgic/Lymphatic:unremarkable     Allergic/Immunologic:unremarkable     Past Medical History:    Reviewed   Past Medical History  Surgical History: unremarkable Medical Problems: reflux, chronic pancreatitis, condyloma of anus Psychiatric History:  Anxiety Allergies: prevacid Medications: citirizine, vicodin, zolpidem, alprazolam, omeprazole   Social History:Reviewed  Social History  Preferred Language: English ([place]) Race:  White Ethnicity: Not Hispanic / Latino Age: 41 Years 10 Months Marital Status:  S Alcohol: Not currently Recreational drug(s):  No   Smoking Status: Current every day smoker reviewed on 09/26/2011 Started Date: 02/12/1988 Packs per day: 1.00   Family History:  Reviewed   Family History  Is there a family history of:No family h/o colon cancer    Objective Information: General:  Well appearing, well nourished in no distress. Heart:  RRR, no murmur Lungs:    CTA bilaterally, no wheezes, rhonchi, rales.  Breathing unlabored. Abdomen:Soft, NT/ND, no HSM, no masses.   deferred to procedure  Assessment:Hematochezia  Diagnosis &amp; Procedure:  DiagnosisCode: 578.1, ProcedureCode: 46962,    Plan:Scheduled for TCS on 10/08/11.   Patient Education:Alternative treatments to surgery were discussed with patient (and family).  Risks and benefits  of procedure were fully explained to the patient (and family) who gave informed consent. Patient/family questions were addressed.  Follow-up:Pending Surgery

## 2011-10-07 MED ORDER — SODIUM CHLORIDE 0.9 % IV SOLN: INTRAVENOUS | Status: DC

## 2011-10-07 MED ORDER — SODIUM CHLORIDE 0.45 % IV SOLN
INTRAVENOUS | Status: DC
  Administered 2011-10-08: 09:00:00 via INTRAVENOUS

## 2011-10-08 ENCOUNTER — Encounter (HOSPITAL_COMMUNITY)
Admission: RE | Disposition: A | Discharge status: Home / Self Care | Payer: Self-pay | Source: Ambulatory Visit | Attending: General Surgery

## 2011-10-08 ENCOUNTER — Encounter (HOSPITAL_COMMUNITY): Payer: Self-pay | Admitting: *Deleted

## 2011-10-08 ENCOUNTER — Ambulatory Visit (HOSPITAL_COMMUNITY)
Admission: RE | Admit: 2011-10-08 | Discharge: 2011-10-08 | Disposition: A | Discharge status: Home / Self Care | Payer: Medicare Other | Source: Ambulatory Visit | Attending: General Surgery | Admitting: General Surgery

## 2011-10-08 DIAGNOSIS — K921 Melena: Secondary | ICD-10-CM | POA: Insufficient documentation

## 2011-10-08 DIAGNOSIS — A630 Anogenital (venereal) warts: Secondary | ICD-10-CM | POA: Insufficient documentation

## 2011-10-08 HISTORY — PX: COLONOSCOPY: SHX5424

## 2011-10-08 SURGERY — COLONOSCOPY
Anesthesia: Moderate Sedation

## 2011-10-08 MED ORDER — MEPERIDINE HCL 100 MG/ML IJ SOLN
INTRAMUSCULAR | Status: AC
  Filled 2011-10-08: qty 2

## 2011-10-08 MED ORDER — STERILE WATER FOR IRRIGATION IR SOLN
Status: DC | PRN
  Administered 2011-10-08: 09:00:00

## 2011-10-08 MED ORDER — MIDAZOLAM HCL 5 MG/5ML IJ SOLN
INTRAMUSCULAR | Status: DC | PRN
  Administered 2011-10-08: 1 mg via INTRAVENOUS
  Administered 2011-10-08: 3 mg via INTRAVENOUS
  Administered 2011-10-08: 1 mg via INTRAVENOUS

## 2011-10-08 MED ORDER — MIDAZOLAM HCL 5 MG/5ML IJ SOLN
INTRAMUSCULAR | Status: AC
  Filled 2011-10-08: qty 10

## 2011-10-08 MED ORDER — MEPERIDINE HCL 25 MG/ML IJ SOLN
INTRAMUSCULAR | Status: DC | PRN
  Administered 2011-10-08: 25 mg via INTRAVENOUS
  Administered 2011-10-08: 50 mg via INTRAVENOUS

## 2011-10-08 NOTE — Op Note (Signed)
[facility] [address] [place], [address]   COLONOSCOPY PROCEDURE REPORT  PATIENT: Brent [name], Brent [name]  MR#: [id number] BIRTHDATE: 23-Sep-1970 , 40  yrs. old GENDER: Male ENDOSCOPIST: Franky Macho, MD REFERRED VH:QIONGEXB, Brett Canales PROCEDURE DATE:  10/08/2011 PROCEDURE:   Colonoscopy, diagnostic ASA CLASS:   Class II INDICATIONS:hematochezia. MEDICATIONS: Versed 5 mg IV and Demerol 75 mg IV  DESCRIPTION OF PROCEDURE:   After the risks benefits and alternatives of the procedure were thoroughly explained, informed consent was obtained.  A digital rectal exam revealed an anal condylomata.   The Pentax Colonoscope [id number]  endoscope was introduced through the anus and advanced to the cecum, which was identified by both the appendix and ileocecal valve. No adverse events experienced.   The quality of the prep was adequate, using Trilyte  The instrument was then slowly withdrawn as the colon was fully examined.      COLON FINDINGS: A normal appearing cecum, ileocecal valve, and appendiceal orifice were identified.  The ascending, hepatic flexure, transverse, splenic flexure, descending, sigmoid colon and rectum appeared unremarkable.  No polyps or cancers were seen. Retroflexed views revealed no abnormalities, no condyloma proximal to dentate line. The time to cecum=11 minutes 0 seconds  Withdrawal time=5 minutes 0 seconds.  The scope was withdrawn and the procedure completed. COMPLICATIONS: There were no complications. ENDOSCOPIC IMPRESSION: 1.   Normal colon 2.    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  RECOMMENDATIONS: repeat Colonscopy in 10 years.  eSigned:  Franky Macho, MD 10/08/2011 9:11 AM   cc:

## 2011-10-08 NOTE — Interval H&P Note (Signed)
History and Physical Interval Note:  10/08/2011 8:37 AM  Brent [name]  has presented today for surgery, with the diagnosis of Special screening for malignant neoplasms, colon [V76.51]  The various methods of treatment have been discussed with the patient and family. After consideration of risks, benefits and other options for treatment, the patient has consented to  Procedure(s) (LRB): COLONOSCOPY (N/A) as a surgical intervention .  The patient's history has been reviewed, patient examined, no change in status, stable for surgery.  I have reviewed the patient's chart and labs.  Questions were answered to the patient's satisfaction.     [name]

## 2011-10-10 ENCOUNTER — Encounter (HOSPITAL_COMMUNITY): Payer: Self-pay | Admitting: General Surgery

## 2012-05-20 ENCOUNTER — Telehealth (HOSPITAL_COMMUNITY): Payer: Self-pay | Admitting: Neurology

## 2012-05-20 NOTE — Telephone Encounter (Signed)
Called pt on 05/19/12 to schedule solumedrol appt and was told he would call me back. I did not rcv a return call from Pt so i called back and he was not home. I left a vm and msg with his mother requesting a call back. I will not make anymore Attempts to call pt.

## 2012-05-25 ENCOUNTER — Ambulatory Visit (HOSPITAL_COMMUNITY): Payer: Medicare Other

## 2012-05-26 ENCOUNTER — Ambulatory Visit (HOSPITAL_COMMUNITY): Payer: Medicare Other

## 2012-05-27 ENCOUNTER — Ambulatory Visit (HOSPITAL_COMMUNITY): Payer: Medicare Other

## 2012-05-28 ENCOUNTER — Ambulatory Visit (HOSPITAL_COMMUNITY): Payer: Medicare Other

## 2012-05-29 ENCOUNTER — Ambulatory Visit (HOSPITAL_COMMUNITY): Payer: Medicare Other

## 2012-06-12 ENCOUNTER — Other Ambulatory Visit (HOSPITAL_COMMUNITY): Payer: Self-pay | Admitting: "Endocrinology

## 2012-06-12 DIAGNOSIS — E0590 Thyrotoxicosis, unspecified without thyrotoxic crisis or storm: Secondary | ICD-10-CM

## 2012-06-15 ENCOUNTER — Encounter (HOSPITAL_COMMUNITY): Payer: Self-pay

## 2012-06-15 ENCOUNTER — Encounter (HOSPITAL_COMMUNITY)
Admission: RE | Admit: 2012-06-15 | Discharge: 2012-06-15 | Disposition: A | Discharge status: Home / Self Care | Payer: Medicare Other | Source: Ambulatory Visit | Attending: "Endocrinology | Admitting: "Endocrinology

## 2012-06-15 DIAGNOSIS — R635 Abnormal weight gain: Secondary | ICD-10-CM | POA: Insufficient documentation

## 2012-06-15 DIAGNOSIS — E0590 Thyrotoxicosis, unspecified without thyrotoxic crisis or storm: Secondary | ICD-10-CM | POA: Insufficient documentation

## 2012-06-15 MED ORDER — SODIUM IODIDE I 131 CAPSULE
9.0000 | Freq: Once | INTRAVENOUS | Status: AC | PRN
  Administered 2012-06-15: 9 via ORAL

## 2012-06-16 ENCOUNTER — Encounter (HOSPITAL_COMMUNITY)
Admission: RE | Admit: 2012-06-16 | Discharge: 2012-06-16 | Disposition: A | Discharge status: Home / Self Care | Payer: Medicare Other | Source: Ambulatory Visit | Attending: "Endocrinology | Admitting: "Endocrinology

## 2012-06-16 DIAGNOSIS — E0590 Thyrotoxicosis, unspecified without thyrotoxic crisis or storm: Secondary | ICD-10-CM | POA: Insufficient documentation

## 2012-06-16 MED ORDER — SODIUM PERTECHNETATE TC 99M INJECTION
10.0000 | Freq: Once | INTRAVENOUS | Status: AC | PRN
  Administered 2012-06-16: 10 via INTRAVENOUS

## 2012-08-11 ENCOUNTER — Other Ambulatory Visit: Payer: Self-pay | Admitting: Pulmonary Disease

## 2012-08-11 NOTE — H&P (Signed)
Brent [name], [name]                ACCOUNT NO.:  [id number]  MEDICAL RECORD NO.:  [id number]  LOCATION:  PERIO                         FACILITY:  [facility]  PHYSICIAN:  Tishanna Dunford L. Juanetta Gosling, M.D.DATE OF BIRTH:  10-24-1970  DATE OF ADMISSION:  08/12/2012 DATE OF DISCHARGE:  LH                             HISTORY & PHYSICAL   HISTORY OF PRESENT ILLNESS:  This is a 42 year old, who has a history of previous pancreatitis and what seems to be multiple sclerosis.  He has a history also of anxiety and gastroesophageal reflux disease.  He has been having cough and congestion and says that he has been exposed to mold in his home.  He was hospitalized at [facility] with what was thought to be multiple sclerosis.  He had an abnormal chest x- ray  there, but did not have any evaluation.  He came to my office last week, says he is still coughing, still congested, still short of breath and I think he needs bronchoscopy to try to get cultures and see exactly what is going on.  PAST MEDICAL HISTORY:  Positive for chronic low back, history of pancreatitis, depression.  SOCIAL HISTORY:  He has about a 5-10 pack year smoking history.  He does use alcohol.  He does not use any illicit drugs.  He has no known drug allergies.  His father had lung cancer.  PHYSICAL EXAMINATION:  GENERAL:  Thin male who is in no acute distress. HEENT:  His pupils are reactive.  Nose and throat are clear.  Mucous membranes are moist. NECK:  Supple without masses. CHEST:  Rhonchi bilaterally. HEART:  Regular without murmur, gallop, or rub. ABDOMEN:  Soft without masses. EXTREMITIES:  No edema.  ASSESSMENT:  He has what may be a multiple sclerosis.  He has a history of pancreatitis.  He has an abnormal chest x-ray, and his chest x-ray shows what may be some areas of infection, infiltrate.  Of interest in July 2013, there was atelectasis and fibrosis in the lung bases. Assessment that he has an abnormal chest x-ray  and needs to have bronchoscopy to try to get some further information about exactly what he may have.     Brent [name] L. Juanetta Gosling, M.D.     ELH/MEDQ  D:  08/11/2012  T:  08/11/2012  Job:  [id number]

## 2012-08-12 ENCOUNTER — Ambulatory Visit (HOSPITAL_COMMUNITY)
Admission: RE | Admit: 2012-08-12 | Discharge: 2012-08-12 | Disposition: A | Discharge status: Home / Self Care | Payer: Medicare Other | Source: Ambulatory Visit | Attending: Pulmonary Disease | Admitting: Pulmonary Disease

## 2012-08-12 ENCOUNTER — Encounter (HOSPITAL_COMMUNITY)
Admission: RE | Disposition: A | Discharge status: Home / Self Care | Payer: Self-pay | Source: Ambulatory Visit | Attending: Pulmonary Disease

## 2012-08-12 ENCOUNTER — Encounter (HOSPITAL_COMMUNITY): Payer: Self-pay | Admitting: *Deleted

## 2012-08-12 DIAGNOSIS — R918 Other nonspecific abnormal finding of lung field: Secondary | ICD-10-CM | POA: Insufficient documentation

## 2012-08-12 HISTORY — PX: FLEXIBLE BRONCHOSCOPY: SHX5094

## 2012-08-12 SURGERY — BRONCHOSCOPY, FLEXIBLE
Anesthesia: Moderate Sedation | Site: Throat | Wound class: Clean Contaminated

## 2012-08-12 MED ORDER — LIDOCAINE VISCOUS 2 % MT SOLN
OROMUCOSAL | Status: AC
  Filled 2012-08-12: qty 15

## 2012-08-12 MED ORDER — FLUMAZENIL 0.5 MG/5ML IV SOLN
INTRAVENOUS | Status: AC
  Filled 2012-08-12: qty 5

## 2012-08-12 MED ORDER — MIDAZOLAM HCL 10 MG/2ML IJ SOLN
INTRAMUSCULAR | Status: DC | PRN
  Administered 2012-08-12 (×4): 2.5 mg via INTRAVENOUS

## 2012-08-12 MED ORDER — STERILE WATER FOR IRRIGATION IR SOLN
Status: DC | PRN
  Administered 2012-08-12: 1000 mL

## 2012-08-12 MED ORDER — LIDOCAINE HCL (PF) 2 % IJ SOLN
INTRAMUSCULAR | Status: AC
  Filled 2012-08-12: qty 20

## 2012-08-12 MED ORDER — SODIUM CHLORIDE 0.9 % IR SOLN
Status: DC | PRN
  Administered 2012-08-12: 1000 mL

## 2012-08-12 MED ORDER — SODIUM CHLORIDE 0.9 % IV SOLN: INTRAVENOUS | Status: DC

## 2012-08-12 MED ORDER — SODIUM CHLORIDE 0.9 % IV SOLN
INTRAVENOUS | Status: DC
  Administered 2012-08-12: 1000 mL via INTRAVENOUS

## 2012-08-12 MED ORDER — LIDOCAINE VISCOUS 2 % MT SOLN
OROMUCOSAL | Status: DC | PRN
  Administered 2012-08-12: 2 mL
  Administered 2012-08-12: 3 mL
  Administered 2012-08-12: 2 mL

## 2012-08-12 MED ORDER — LIDOCAINE HCL (PF) 2 % IJ SOLN
INTRAMUSCULAR | Status: DC | PRN
  Administered 2012-08-12: 7 mL
  Administered 2012-08-12: 13 mL

## 2012-08-12 MED ORDER — BUTAMBEN-TETRACAINE-BENZOCAINE 2-2-14 % EX AERO
INHALATION_SPRAY | CUTANEOUS | Status: DC | PRN
  Administered 2012-08-12: 2 via TOPICAL

## 2012-08-12 MED ORDER — MIDAZOLAM HCL 10 MG/2ML IJ SOLN
INTRAMUSCULAR | Status: AC
  Filled 2012-08-12: qty 4

## 2012-08-12 SURGICAL SUPPLY — 17 items
BRUSH CYTOL CELLEBRITY 1.5X140 (MISCELLANEOUS) ×2 IMPLANT
CLOTH BEACON ORANGE TIMEOUT ST (SAFETY) ×2 IMPLANT
CONNECTOR 5 IN 1 STRAIGHT STRL (MISCELLANEOUS) ×2 IMPLANT
FORCEPS BIOP RJ4 1.8 (CUTTING FORCEPS) ×2 IMPLANT
GLOVE BIO SURGEON STRL SZ7.5 (GLOVE) ×2 IMPLANT
GLOVE BIOGEL PI IND STRL 6.5 (GLOVE) IMPLANT
GLOVE BIOGEL PI INDICATOR 6.5 (GLOVE) ×1
MARKER SKIN DUAL TIP RULER LAB (MISCELLANEOUS) ×2 IMPLANT
NS IRRIG 1000ML POUR BTL (IV SOLUTION) ×2 IMPLANT
SPONGE GAUZE 4X4 12PLY (GAUZE/BANDAGES/DRESSINGS) ×2 IMPLANT
SYR 20CC LL (SYRINGE) ×2 IMPLANT
SYR 30ML LL (SYRINGE) ×2 IMPLANT
SYR CONTROL 10ML LL (SYRINGE) ×2 IMPLANT
TRAP SPECIMEN CP (MISCELLANEOUS) ×2 IMPLANT
VALVE DISPOSABLE (MISCELLANEOUS) ×2 IMPLANT
WATER STERILE IRR 1000ML POUR (IV SOLUTION) ×2 IMPLANT
YANKAUER SUCT BULB TIP 10FT TU (MISCELLANEOUS) ×4 IMPLANT

## 2012-08-12 NOTE — H&P (Signed)
  Patient seen and examined. No change from H& P yesterday

## 2012-08-12 NOTE — Op Note (Signed)
Bronchoscopy Procedure Note  Date of Operation: 08/12/2012  Pre-op Diagnosis: Abnormal chest x-ray  Post-op Diagnosis: Same  Surgeon: Rocklyn Mayberry L  Assistants: None  Anesthesia: Monitored Local Anesthesia with Sedation  Operation: Flexible fiberoptic bronchoscopy, diagnostic   Findings: No endobronchial lesions  Specimen: Bronchial washing  Estimated Blood Loss: Minimal  Drains: None  Complications: None  Indications and History: The patient is a 42 y.o. male with cough for 6 months with abnormal chest x-ray and chest CT done at [facility] approximately a month ago.  The risks, benefits, complications, treatment options and expected outcomes were discussed with the patient.  The possibilities of reaction to medication, pulmonary aspiration, perforation of a viscus, bleeding, failure to diagnose a condition and creating a complication requiring transfusion or operation were discussed with the patient who freely signed the consent.    Description of Procedure: The patient was seen in the Holding Room and the site of surgery properly noted/marked.  The patient was taken to operating room 2, identified as Brent [name] and the procedure verified as Flexible Fiberoptic Bronchoscopy.  A Time Out was held and the above information confirmed.   After the induction of topical nasopharyngeal anesthesia, the patient was positioned supine and the bronchoscope was passed through the right naris . The vocal cords were visualized and  2% plain plain 7 ml was topically placed onto the cords. The cords were normal. The scope was then passed into the trachea.  2% plain plain 7 ml was used topically on the carina.  Careful inspection of the tracheal lumen was accomplished. The scope was sequentially passed into the left main and then left upper and lower bronchi and segmental bronchi. Washing was done and there was fluid specimen.   The scope was then withdrawn and advanced into the right  main bronchus and then into the RUL, RML, and RLL bronchi and segmental bronchi. Washing was done and there was fluid specimen.   Endobronchial findings: Mild erythema of the entire endobronchial tree Trachea: Erythematous Carina: Erythematous Right main bronchus: Erythematous Right upper lobe bronchus: Erythematous Right upper lobe bronchus: Erythematous Right upper lobe bronchus: Erythematous Left main bronchus: Erythematous Left upper lobe bronchus: Erythematous Left lower lobe bronchus: Erythematous  The Patient was taken to the Endoscopy Recovery area in satisfactory condition.  Attestation: I performed the procedure.  Simmone Cape L

## 2012-08-13 ENCOUNTER — Encounter (HOSPITAL_COMMUNITY): Payer: Self-pay | Admitting: Pulmonary Disease

## 2012-08-16 LAB — CULTURE, RESPIRATORY W GRAM STAIN

## 2012-08-18 LAB — PNEUMOCYSTIS JIROVECI SMEAR BY DFA: Pneumocystis jiroveci Ag: NEGATIVE

## 2012-08-27 DIAGNOSIS — H35069 Retinal vasculitis, unspecified eye: Secondary | ICD-10-CM | POA: Insufficient documentation

## 2012-09-08 LAB — FUNGUS CULTURE W SMEAR: Fungal Smear: NONE SEEN

## 2012-09-24 LAB — AFB CULTURE WITH SMEAR (NOT AT ARMC): Acid Fast Smear: NONE SEEN

## 2012-10-06 ENCOUNTER — Encounter: Payer: Self-pay | Admitting: Gastroenterology

## 2012-11-02 ENCOUNTER — Encounter: Payer: Self-pay | Admitting: General Surgery

## 2012-11-03 ENCOUNTER — Ambulatory Visit (INDEPENDENT_AMBULATORY_CARE_PROVIDER_SITE_OTHER): Payer: Medicare Other | Admitting: Gastroenterology

## 2012-11-03 ENCOUNTER — Encounter: Payer: Self-pay | Admitting: Gastroenterology

## 2012-11-03 VITALS — BP 152/95 | HR 116 | Temp 97.4°F | Ht 67.0 in | Wt 143.0 lb

## 2012-11-03 DIAGNOSIS — R1013 Epigastric pain: Secondary | ICD-10-CM

## 2012-11-03 DIAGNOSIS — K921 Melena: Secondary | ICD-10-CM | POA: Insufficient documentation

## 2012-11-03 DIAGNOSIS — R1310 Dysphagia, unspecified: Secondary | ICD-10-CM | POA: Insufficient documentation

## 2012-11-03 DIAGNOSIS — R1314 Dysphagia, pharyngoesophageal phase: Secondary | ICD-10-CM

## 2012-11-03 NOTE — Progress Notes (Signed)
Primary Care Physician:  Milana Obey, MD  Primary Gastroenterologist:  Roetta Sessions, MD   Chief Complaint  Patient presents with  . Burning sensation when swallows    HPI:  Brent [name] is a 42 y.o. male here for further evaluation of odynophagia.  We last saw the patient in August 2012. He has a history of recurrent pancreatitis, but felt to be related to alcohol use. Initial episode in 2006 per patient. In 2012 he underwent an EUS by Dr. Christella Hartigan that showed a normal esophagus and stomach, irregular duodenal mucosa, question of peptic duodenitis. Biopsy negative for celiac. No changes of chronic pancreatitis or discrete masses. Main pancreatic duct was normal, and common bile duct normal and nondilated without filling defects, no peripancreatic adenopathy.  Since last OV, patient reports being given diagnosis of probable MS. Has been on chronic prednisone since 01/2012. Began with vision changes. States he has been having problems with recurrent URI for several months. Every time he comes off of antibiotics he starts feeling poorly. Has been on Ceftin chronically. Thinks it all started with mold in the house he moved into just before symptoms started last fall. Had recent bronchoscopy by Dr. Juanetta Gosling (08/2012, erythema noted throughout but not lesions). This was done due to abnormal Chest CT in 06/2012 at [facility] that showed abnormalities of upper lobes with background of emphysema. Via bronchoscopy the Pneumocystis, AFB were negative. Few Streptococcus pneumoniae grew on culture. Also grew Candida Glabrata on the fungus culture. States when he gets all congested and has upper airway issues, he has bad hoarseness and odynophagia. Complains of constant substernal pain. +dysphagia to solid food, sometimes food comes back up. Has pain at umbilical hernia at times. Admits to lots of BCs. Sometimes stools are black. Denies Pepto. No brbpr, constipation, diarrhea. Says Dr. Fransico Him has diagnosed him with  thyroid issues and borderline DM.  Weight up from 116 in 04/2010 to 143 now.     Inadequate conscious sedation last year with Dr. Lovell Sheehan at time of TCS.   Current Outpatient Prescriptions  Medication Sig Dispense Refill  . ALPRAZolam (XANAX) 0.5 MG tablet Take 1 mg by mouth 4 (four) times daily as needed. For anxiety      . cefUROXime (CEFTIN) 500 MG tablet Take 500 mg by mouth 2 (two) times daily.      . cetirizine (ZYRTEC) 10 MG tablet Take 10 mg by mouth daily as needed. For allergies       . Cyanocobalamin (B-12 PO) Take 1 tablet by mouth daily.      . fluconazole (DIFLUCAN) 100 MG tablet Take 100 mg by mouth daily.      Marland Kitchen HYDROcodone-acetaminophen (NORCO) 10-325 MG per tablet Take 1 tablet by mouth every 4 (four) hours as needed for pain.      . Multiple Vitamin (MULTIVITAMIN) tablet Take 1 tablet by mouth daily.      . NON FORMULARY Vitamin D3    One tablet daily      . nystatin (MYCOSTATIN) 100000 UNIT/ML suspension Take 100,000 Units by mouth 4 (four) times daily.      Marland Kitchen omeprazole (PRILOSEC) 40 MG capsule Take 40 mg by mouth daily.        . predniSONE (DELTASONE) 20 MG tablet Take 20 mg by mouth 2 (two) times daily.      . traMADol (ULTRAM) 50 MG tablet Take 50 mg by mouth every 6 (six) hours as needed for pain.      Marland Kitchen zolpidem (AMBIEN) 10  MG tablet Take 10 mg by mouth at bedtime as needed for sleep.       No current facility-administered medications for this visit.    Allergies as of 11/03/2012  . (No Known Allergies)    Past Medical History  Diagnosis Date  . Condylomata acuminata in male     multiple procedures  . GERD (gastroesophageal reflux disease)   . Chronic back pain   . Pancreatitis     H/O  . Suicidal ideation     Admission in 2006 for suicidal ideation without attempt  . Anxiety     Disabled due to panic attacks  . Pancreatitis, alcoholic     admission 08/2011  . Multiple sclerosis     Past Surgical History  Procedure Laterality Date  . Egd with ed  by dr. Karilyn Cota  6/06    erosive RE with small sliding hh. HP neg, 66F  . Tcs by dr. Karilyn Cota  12/05    External hemorrhoids  . Electrocautery/dessication of condyloma of penis and perianal area  2009/2011    Dr. Leticia Penna  . Colonoscopy  10/08/2011    Jenkins:Normal colon/Anal condyloma without extension proximal to dentate line  . Flexible bronchoscopy N/A 08/12/2012    Procedure: FLEXIBLE BRONCHOSCOPY;  Surgeon: Fredirick Maudlin, MD;  Location: [facility];  Service: Pulmonary;  Laterality: N/A;    Family History  Problem Relation Age of Onset  . Lung cancer Father 8  . Colon cancer Neg Hx   . Colitis Neg Hx   . Cirrhosis Neg Hx   . Liver disease Neg Hx   . Pancreatic cancer Neg Hx   . Pancreatitis Neg Hx   . Anesthesia problems Neg Hx   . Hypotension Neg Hx   . Malignant hyperthermia Neg Hx   . Pseudochol deficiency Neg Hx     History   Social History  . Marital Status: Single    Spouse Name: N/A    Number of Children: 0  . Years of Education: N/A   Occupational History  . disabled     anxiety   Social History Main Topics  . Smoking status: Current Every Day Smoker -- 20 years    Types: Cigarettes  . Smokeless tobacco: Not on file     Comment: 15 to 20 cigarrettes daily  . Alcohol Use: Yes     Comment: . 2 weeks ago  . Drug Use: No  . Sexual Activity: Not on file   Other Topics Concern  . Not on file   Social History Narrative  . No narrative on file      ROS:  General: Negative for anorexia, weight loss, fever, chills, fatigue, weakness. Eyes: see hpi. 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 ENT: Negative for hoarseness, difficulty swallowing , nasal congestion. CV: Negative for chest pain, angina, palpitations, dyspnea on exertion, peripheral edema.  Respiratory: Negative for dyspnea at rest, dyspnea on exertion. + cough, sputum.  GI: See history of present illness. GU:  Negative for dysuria, hematuria, urinary incontinence, urinary  frequency, nocturnal urination.  MS: Negative for joint pain, low back pain.  Derm: Negative for rash or itching.  Neuro: Negative for weakness, abnormal sensation, seizure, frequent headaches, memory loss, confusion.  Psych: Negative for anxiety, depression, suicidal ideation, hallucinations.  Endo: Negative for unusual weight change.  Heme: Negative for bruising or bleeding. Allergy: Negative for rash or hives.    Physical Examination:  BP 152/95  Pulse 116  Temp(Src) 97.4  F (36.3 C) (Oral)  Ht 5\' 7"  (1.702 m)  Wt 143 lb (64.864 kg)  BMI 22.39 kg/m2   General: Well-nourished, well-developed in no acute distress. Rounded facies. Head: Normocephalic, atraumatic.   Eyes: Conjunctiva pink, no icterus. Mouth: Oropharyngeal mucosa moist and pink , no lesions erythema or exudate. Neck: Supple without thyromegaly, masses, or lymphadenopathy.  Lungs: Clear to auscultation bilaterally.  Heart: Regular rate and rhythm, no murmurs rubs or gallops.  Abdomen: Bowel sounds are normal,epigastric pain, nondistended, no hepatosplenomegaly or masses, no abdominal bruits or    hernia , no rebound or guarding.   Rectal: not performed Extremities: No lower extremity edema. No clubbing or deformities.  Neuro: Alert and oriented x 4 , grossly normal neurologically.  Skin: Warm and dry, no rash or jaundice.   Psych: Alert and cooperative, normal mood and affect.

## 2012-11-03 NOTE — Patient Instructions (Addendum)
1. We have scheduled you for an upper endoscopy with Dr. Jena Gauss. Please see separate instructions.

## 2012-11-04 NOTE — Patient Instructions (Signed)
Brent [name]  11/04/2012   Your procedure is scheduled on:  11/12/12  [facility] to [facility] [place] [facility].  Call this number if you have problems the morning of surgery: [phone]   Remember:   Do not eat food or drink liquids after midnight.   Take these medicines the morning of surgery with A SIP OF WATER: xanax, zyrtec, norco, prilosec, prednisone  Do not wear jewelry, make-up or nail polish.  Do not wear lotions, powders, or perfumes. You may wear deodorant.  Do not shave 48 hours prior to surgery. Men may shave face and neck.  Do not bring valuables to the hospital.  [facility] is not responsible                  for any belongings or valuables.               Contacts, dentures or bridgework may not be worn into surgery.  Leave suitcase in the car. After surgery it may be brought to your room.  For patients admitted to the hospital, discharge time is determined by your                treatment team.               Patients discharged the day of surgery will not be allowed to drive  home.  Name and phone number of your driver: family  Special Instructions: N/A   Please read over the following fact sheets that you were given: Anesthesia Post-op Instructions and Care and Recovery After Surgery   PATIENT INSTRUCTIONS POST-ANESTHESIA  IMMEDIATELY FOLLOWING SURGERY:  Do not drive or operate machinery for the first twenty four hours after surgery.  Do not make any important decisions for twenty four hours after surgery or while taking narcotic pain medications or sedatives.  If you develop intractable nausea and vomiting or a severe headache please notify your doctor immediately.  FOLLOW-UP:  Please make an appointment with your surgeon as instructed. You do not need to follow up with anesthesia unless specifically instructed to do so.  WOUND CARE INSTRUCTIONS (if applicable):  Keep a dry clean dressing on the anesthesia/puncture wound site if there is drainage.  Once the wound has  quit draining you may leave it open to air.  Generally you should leave the bandage intact for twenty four hours unless there is drainage.  If the epidural site drains for more than 36-48 hours please call the anesthesia department.  QUESTIONS?:  Please feel free to call your physician or the hospital operator if you have any questions, and they will be happy to assist you.      Esophageal Dilatation The esophagus is the long, narrow tube which carries food and liquid from the mouth to the stomach. Esophageal dilatation is the technique used to stretch a blocked or narrowed portion of the esophagus. This procedure is used when a part of the esophagus has become so narrow that it becomes difficult, painful or even impossible to swallow. This is generally an uncomplicated form of treatment. When this is not successful, chest surgery may be required. This is a much more extensive form of treatment with a longer recovery time. CAUSES  Some of the more common causes of blockage or strictures of the esophagus are:  Narrowing from longstanding inflammation (soreness and redness) of the lower esophagus. This comes from the constant exposure of the lower esophagus to the acid which bubbles up from the stomach.  Over time this causes scarring and narrowing of the lower esophagus.  Hiatal hernia in which a small part of the stomach bulges (herniates) up through the diaphragm. This can cause a gradual narrowing of the end of the esophagus.  Schatzki's Ring is a narrow ring of benign (non-cancerous) fibrous tissue which constricts the lower esophagus. The reason for this is not known.  Scleroderma is a connective tissue disorder that affects the esophagus and makes swallowing difficult.  Achalasia is an absence of nerves to the lower esophagus and to the esophageal sphincter. This is the circular muscle between the stomach and esophagus that relaxes to allow food into the stomach. After swallowing, it contracts  to keep food in the stomach. This absence of nerves may be congenital (present since birth). This can cause irregular spasms of the lower esophageal muscle. This spasm does not open up to allow food and fluid through. The result is a persistent blockage with subsequent slow trickling of the esophageal contents into the stomach.  Strictures may develop from swallowing materials which damage the esophagus. Some examples are strong acids or alkalis such as lye.  Growths such as benign (non-cancerous) and malignant (cancerous) tumors can block the esophagus.  Heredity (present since birth) causes. DIAGNOSIS  Your caregiver often suspects this problem by taking a medical history. They will also do a physical exam. They can then prove their suspicions using X-rays and endoscopy. Endoscopy is an exam in which a tube like a small flexible telescope is used to look at your esophagus.  TREATMENT There are different stretching (dilating) techniques which can be used. Simple bougie dilatation may be done in the office. This usually takes only a couple minutes. A numbing (anesthetic) spray of the throat is used. Endoscopy, when done, is done in an endoscopy suite, under mild sedation. When fluoroscopy is used, the procedure is performed in X-ray. Other techniques require a little longer time. Recovery is usually quick. There is no waiting time to begin eating and drinking to test success of the treatment. Following are some of the methods used. Narrowing of the esophagus is treated by making it bigger. Commonly this is a mechanical problem which can be treated with stretching. This can be done in different ways. Your caregiver will discuss these with you. Some of the means used are:  A series of graduated (increasing thickness) flexible dilators can be used. These are weighted tubes passed through the esophagus into the stomach. The tubes used become progressively larger until the desired stretched size is reached.  Graduated dilators are a simple and quick way of opening the esophagus. No visualization is required.  Another method is the use of endoscopy to place a flexible wire across the stricture. The endoscope is removed and the wire left in place. A dilator with a hole through it from end to end is guided down the esophagus and across the stricture. One or more of these dilators are passed over the wire. At the end of the exam, the wire is removed. This type of treatment may be performed in the X-ray department under fluoroscopy. An advantage of this procedure is the examiner is visualizing the end opening in the esophagus.  Stretching of the esophagus may be done using balloons. Deflated balloons are placed through the endoscope and across the stricture. This type of balloon dilatation is often done at the time of endoscopy or fluoroscopy. Flexible endoscopy allows the examiner to directly view the stricture. A balloon is inserted in the  deflated form into the area of narrowing. It is then inflated with air to a certain pressure that is pre-set for a given circumference. When inflated, it becomes sausage shaped, stretched, and makes the stricture larger.  Achalasia requires a longer larger balloon-type dilator. This is frequently done under X-ray control. In this situation, the spastic muscle fibers in the lower esophagus are stretched. All of the above procedures make the passage of food and water into the stomach easier. They also make it easier for stomach contents to reflux back into the esophagus. Special medications may be used following the procedure to help prevent further stricturing. Proton-pump inhibitor medications are good at decreasing the amount of acid in the stomach juice. When stomach juice refluxes into the esophagus, the juice is no longer as acidic and is less likely to burn or scar the esophagus. RISKS AND COMPLICATIONS Esophageal dilatation is usually performed effectively and without  problems. Some complications that can occur are:  A small amount of bleeding almost always happens where the stretching takes place. If this is too excessive it may require more aggressive treatment.  An uncommon complication is perforation (making a hole) of the esophagus. The esophagus is thin. It is easy to make a hole in it. If this happens, an operation may be necessary to repair this.  A small, undetected perforation could lead to an infection in the chest. This can be very serious. HOME CARE INSTRUCTIONS   If you received sedation for your procedure, do not drive, make important decisions, or perform any activities requiring your full coordination. Do not drink alcohol, take sedatives, or use any mind altering chemicals unless instructed by your caregiver.  You may use throat lozenges or warm salt water gargles if you have throat discomfort  You can begin eating and drinking normally on return home unless instructed otherwise. Do not purposely try to force large chunks of food down to test the benefits of your procedure.  Mild discomfort can be eased with sips of ice water.  Medications for discomfort may or may not be needed. SEEK IMMEDIATE MEDICAL CARE IF:   You begin vomiting up blood.  You develop black tarry stools  You develop chills or an unexplained temperature of over 101 F (38.3 C)  You develop chest or abdominal pain.  You develop shortness of breath or feel lightheaded or faint.  Your swallowing is becoming more painful, difficult, or you are unable to swallow. MAKE SURE YOU:   Understand these instructions.  Will watch your condition.  Will get help right away if you are not doing well or get worse. Document Released: 03/21/2005 Document Revised: 04/22/2011 Document Reviewed: 05/08/2005 [facility] Patient Information 2014 [place], [place]. Esophagogastroduodenoscopy Esophagogastroduodenoscopy (EGD) is a procedure to examine the lining of the esophagus,  stomach, and first part of the small intestine (duodenum). A long, flexible, lighted tube with a camera attached (endoscope) is inserted down the throat to view these organs. This procedure is done to detect problems or abnormalities, such as inflammation, bleeding, ulcers, or growths, in order to treat them. The procedure lasts about 5 20 minutes. It is usually an outpatient procedure, but it may need to be performed in emergency cases in the hospital. LET YOUR CAREGIVER KNOW ABOUT:   Allergies to food or medicine.  All medicines you are taking, including vitamins, herbs, eyedrops, and over-the-counter medicines and creams.  Use of steroids (by mouth or creams).  Previous problems you or members of your family have had with the  use of anesthetics.  Any blood disorders you have.  Previous surgeries you have had.  Other health problems you have.  Possibility of pregnancy, if this applies. RISKS AND COMPLICATIONS  Generally, EGD is a safe procedure. However, as with any procedure, complications can occur. Possible complications include:  Infection.  Bleeding.  Tearing (perforation) of the esophagus, stomach, or duodenum.  Difficulty breathing or not being able to breath.  Excessive sweating.  Spasms of the larynx.  Slowed heartbeat.  Low blood pressure. BEFORE THE PROCEDURE  Do not eat or drink anything for 6 8 hours before the procedure or as directed by your caregiver.  Ask your caregiver about changing or stopping your regular medicines.  If you wear dentures, be prepared to remove them before the procedure.  Arrange for someone to drive you home after the procedure. PROCEDURE   A vein will be accessed to give medicines and fluids. A medicine to relax you (sedative) and a pain reliever will be given through that access into the vein.  A numbing medicine (local anesthetic) may be sprayed on your throat for comfort and to stop you from gagging or coughing.  A mouth  guard may be placed in your mouth to protect your teeth and to keep you from biting on the endoscope.  You will be asked to lie on your left side.  The endoscope is inserted down your throat and into the esophagus, stomach, and duodenum.  Air is put through the endoscope to allow your caregiver to view the lining of your esophagus clearly.  The esophagus, stomach, and duodenum is then examined. During the exam, your caregiver may:  Remove tissue to be examined under a microscope (biopsy) for inflammation, infection, or other medical problems.  Remove growths.  Remove objects (foreign bodies) that are stuck.  Treat any bleeding with medicines or other devices that stop tissues from bleeding (hot cauters, clipping devices).  Widen (dilate) or stretch narrowed areas of the esophagus and stomach.  The endoscope will then be withdrawn. AFTER THE PROCEDURE  You will be taken to a recovery area to be monitored. You will be able to go home once you are stable and alert.  Do not eat or drink anything until the local anesthetic and numbing medicines have worn off. You may choke.  It is normal to feel bloated, have pain with swallowing, or have a sore throat for a short time. This will wear off.  Your caregiver should be able to discuss his or her findings with you. It will take longer to discuss the test results if any biopsies were taken. Document Released: 05/31/2004 Document Revised: 01/15/2012 Document Reviewed: 01/01/2012 [facility] Patient Information 2014 [place], [place].

## 2012-11-05 ENCOUNTER — Encounter: Payer: Self-pay | Admitting: Gastroenterology

## 2012-11-05 ENCOUNTER — Encounter (HOSPITAL_COMMUNITY)
Admission: RE | Admit: 2012-11-05 | Discharge: 2012-11-05 | Disposition: A | Discharge status: Home / Self Care | Payer: Medicare Other | Source: Ambulatory Visit | Attending: Internal Medicine | Admitting: Internal Medicine

## 2012-11-05 NOTE — Assessment & Plan Note (Addendum)
42 year old gentleman on chronic steroids due to probable MS who presents with epigastric pain, melena, odynophagia/dysphagia. Also admits to frequent aspirin powder use. History of pancreatitis felt to be related to alcohol use in the past. Last episode one year ago. Denies ongoing alcohol use. He has been plagued with recurrent upper respiratory issues requiring chronic antibiotic therapy, antifungal therapy as outlined above. HIV negative. Would be concerned about possibility of peptic ulcer disease, esophagitis related to opportunistic infection such as CMV or Candida. Recommend upper endoscopy for further evaluation. Patient reports an adequate conscious sedation last year therefore we'll offer deep sedation in the OR given his polypharmacy and history of alcohol abuse in the past.  I have discussed the risks, alternatives, benefits with regards to but not limited to the risk of reaction to medication, bleeding, infection, perforation and the patient is agreeable to proceed. Written consent to be obtained.  Regarding pulmonary issues, would advise he followup with Dr. Juanetta Gosling. Consider repeat imaging see if any improvement of previous findings. Notably patient has been on Ceftin, nystatin, Diflucan since June.

## 2012-11-05 NOTE — Progress Notes (Signed)
cc'd to pcp 

## 2012-11-09 ENCOUNTER — Encounter (HOSPITAL_COMMUNITY): Payer: Self-pay

## 2012-11-09 ENCOUNTER — Encounter (HOSPITAL_COMMUNITY)
Admission: RE | Admit: 2012-11-09 | Discharge: 2012-11-09 | Disposition: A | Discharge status: Home / Self Care | Payer: Medicare Other | Source: Ambulatory Visit | Attending: Internal Medicine | Admitting: Internal Medicine

## 2012-11-09 HISTORY — DX: Unspecified osteoarthritis, unspecified site: M19.90

## 2012-11-09 HISTORY — DX: Headache: R51

## 2012-11-09 HISTORY — DX: Major depressive disorder, single episode, unspecified: F32.9

## 2012-11-09 HISTORY — DX: Depression, unspecified: F32.A

## 2012-11-09 LAB — BASIC METABOLIC PANEL
Chloride: 99 mEq/L (ref 96–112)
Creatinine, Ser: 0.85 mg/dL (ref 0.50–1.35)
GFR calc Af Amer: >90 mL/min (ref >90)
Potassium: 3.5 mEq/L (ref 3.5–5.1)
Sodium: 138 mEq/L (ref 135–145)

## 2012-11-09 LAB — HEMOGLOBIN AND HEMATOCRIT, BLOOD: Hemoglobin: 14.2 g/dL (ref 13.0–17.0)

## 2012-11-12 ENCOUNTER — Encounter (HOSPITAL_COMMUNITY): Payer: Self-pay | Admitting: Certified Registered"

## 2012-11-12 ENCOUNTER — Ambulatory Visit (HOSPITAL_COMMUNITY)
Admission: RE | Admit: 2012-11-12 | Discharge: 2012-11-12 | Disposition: A | Discharge status: Home / Self Care | Payer: Medicare Other | Source: Ambulatory Visit | Attending: Internal Medicine | Admitting: Internal Medicine

## 2012-11-12 ENCOUNTER — Ambulatory Visit (HOSPITAL_COMMUNITY): Payer: Medicare Other | Admitting: Certified Registered"

## 2012-11-12 ENCOUNTER — Encounter (HOSPITAL_COMMUNITY): Payer: Self-pay | Admitting: *Deleted

## 2012-11-12 ENCOUNTER — Encounter (HOSPITAL_COMMUNITY)
Admission: RE | Disposition: A | Discharge status: Home / Self Care | Payer: Self-pay | Source: Ambulatory Visit | Attending: Internal Medicine

## 2012-11-12 DIAGNOSIS — R1310 Dysphagia, unspecified: Secondary | ICD-10-CM

## 2012-11-12 DIAGNOSIS — K861 Other chronic pancreatitis: Secondary | ICD-10-CM | POA: Insufficient documentation

## 2012-11-12 DIAGNOSIS — K2960 Other gastritis without bleeding: Secondary | ICD-10-CM

## 2012-11-12 DIAGNOSIS — Z01812 Encounter for preprocedural laboratory examination: Secondary | ICD-10-CM | POA: Insufficient documentation

## 2012-11-12 DIAGNOSIS — K259 Gastric ulcer, unspecified as acute or chronic, without hemorrhage or perforation: Secondary | ICD-10-CM | POA: Insufficient documentation

## 2012-11-12 HISTORY — PX: ESOPHAGOGASTRODUODENOSCOPY (EGD) WITH PROPOFOL: SHX5813

## 2012-11-12 HISTORY — PX: BIOPSY: SHX5522

## 2012-11-12 HISTORY — PX: MALONEY DILATION: SHX5535

## 2012-11-12 SURGERY — ESOPHAGOGASTRODUODENOSCOPY (EGD) WITH PROPOFOL
Anesthesia: Monitor Anesthesia Care | Site: Mouth

## 2012-11-12 MED ORDER — STERILE WATER FOR IRRIGATION IR SOLN
Status: DC | PRN
  Administered 2012-11-12: 10:00:00

## 2012-11-12 MED ORDER — PROPOFOL 10 MG/ML IV EMUL
INTRAVENOUS | Status: AC
  Filled 2012-11-12: qty 40

## 2012-11-12 MED ORDER — ONDANSETRON HCL 4 MG/2ML IJ SOLN: 4.0000 mg | Freq: Once | INTRAMUSCULAR | Status: DC | PRN

## 2012-11-12 MED ORDER — BUTAMBEN-TETRACAINE-BENZOCAINE 2-2-14 % EX AERO
1.0000 | INHALATION_SPRAY | Freq: Once | CUTANEOUS | Status: AC
  Administered 2012-11-12: 1 via TOPICAL
  Filled 2012-11-12: qty 56

## 2012-11-12 MED ORDER — FENTANYL CITRATE 0.05 MG/ML IJ SOLN: 25.0000 ug | INTRAMUSCULAR | Status: DC | PRN

## 2012-11-12 MED ORDER — FENTANYL CITRATE 0.05 MG/ML IJ SOLN
25.0000 ug | INTRAMUSCULAR | Status: AC
  Administered 2012-11-12 (×2): 25 ug via INTRAVENOUS

## 2012-11-12 MED ORDER — ONDANSETRON HCL 4 MG/2ML IJ SOLN
INTRAMUSCULAR | Status: AC
  Filled 2012-11-12: qty 2

## 2012-11-12 MED ORDER — PROPOFOL INFUSION 10 MG/ML OPTIME
INTRAVENOUS | Status: DC | PRN
  Administered 2012-11-12: 200 ug/kg/min via INTRAVENOUS

## 2012-11-12 MED ORDER — MIDAZOLAM HCL 2 MG/2ML IJ SOLN
1.0000 mg | INTRAMUSCULAR | Status: DC | PRN
  Administered 2012-11-12: 2 mg via INTRAVENOUS

## 2012-11-12 MED ORDER — LACTATED RINGERS IV SOLN
INTRAVENOUS | Status: DC
  Administered 2012-11-12: 09:00:00 via INTRAVENOUS

## 2012-11-12 MED ORDER — LACTATED RINGERS IV SOLN
INTRAVENOUS | Status: DC | PRN
  Administered 2012-11-12: 09:00:00 via INTRAVENOUS

## 2012-11-12 MED ORDER — MIDAZOLAM HCL 2 MG/2ML IJ SOLN
INTRAMUSCULAR | Status: AC
  Filled 2012-11-12: qty 2

## 2012-11-12 MED ORDER — ONDANSETRON HCL 4 MG/2ML IJ SOLN
4.0000 mg | Freq: Once | INTRAMUSCULAR | Status: AC
  Administered 2012-11-12: 4 mg via INTRAVENOUS

## 2012-11-12 MED ORDER — FENTANYL CITRATE 0.05 MG/ML IJ SOLN
INTRAMUSCULAR | Status: AC
Start: 2012-11-12 — End: 2012-11-12
  Filled 2012-11-12: qty 2

## 2012-11-12 SURGICAL SUPPLY — 11 items
BLOCK BITE 60FR ADLT L/F BLUE (MISCELLANEOUS) ×4 IMPLANT
FLOOR PAD 36X40 (MISCELLANEOUS) ×4
FORCEPS BIOP RAD 4 LRG CAP 4 (CUTTING FORCEPS) ×4 IMPLANT
FORMALIN 10 PREFIL 20ML (MISCELLANEOUS) ×2 IMPLANT
MANIFOLD NEPTUNE II (INSTRUMENTS) ×1 IMPLANT
PAD FLOOR 36X40 (MISCELLANEOUS) ×3 IMPLANT
SPONGE GAUZE 4X4 12PLY (GAUZE/BANDAGES/DRESSINGS) ×1 IMPLANT
SYR 50ML LL SCALE MARK (SYRINGE) ×1 IMPLANT
TUBING ENDO SMARTCAP PENTAX (MISCELLANEOUS) ×4 IMPLANT
TUBING IRRIGATION ENDOGATOR (MISCELLANEOUS) ×4 IMPLANT
WATER STERILE IRR 1000ML POUR (IV SOLUTION) ×1 IMPLANT

## 2012-11-12 NOTE — Anesthesia Postprocedure Evaluation (Signed)
  Anesthesia Post-op Note  Patient: Brent [name]  Procedure(s) Performed: Procedure(s): ESOPHAGOGASTRODUODENOSCOPY (EGD) WITH PROPOFOL (N/A) MALONEY DILATION (54mm) (N/A) GASTRIC AND ESOPHAGEAL BIOPSIES  Patient Location: PACU  Anesthesia Type:MAC  Level of Consciousness: awake, alert  and oriented  Airway and Oxygen Therapy: Patient Spontanous Breathing and Patient connected to nasal cannula oxygen  Post-op Pain: none  Post-op Assessment: Post-op Vital signs reviewed, Patient's Cardiovascular Status Stable, Respiratory Function Stable, Patent Airway and No signs of Nausea or vomiting  Post-op Vital Signs: Reviewed and stable  Complications: No apparent anesthesia complications

## 2012-11-12 NOTE — Anesthesia Preprocedure Evaluation (Signed)
Anesthesia Evaluation  Patient identified by MRN, date of birth, ID band Patient awake    Reviewed: Allergy & Precautions, H&P , NPO status , Patient's Chart, lab work & pertinent test results  Airway Mallampati: II TM Distance: >3 FB     Dental  (+) Poor Dentition, Missing and Chipped   Pulmonary shortness of breath, Current Smoker,  breath sounds clear to auscultation        Cardiovascular negative cardio ROS  Rhythm:Regular Rate:Normal     Neuro/Psych  Headaches, PSYCHIATRIC DISORDERS Anxiety Depression ? Hx MS, multiple brain lesions on MRI. Predisone 50mg  QD now.    GI/Hepatic GERD-  ,  Endo/Other  diabetes, Type 2  Renal/GU      Musculoskeletal   Abdominal   Peds  Hematology   Anesthesia Other Findings   Reproductive/Obstetrics                           Anesthesia Physical Anesthesia Plan  ASA: III  Anesthesia Plan: MAC   Post-op Pain Management:    Induction: Intravenous  Airway Management Planned: Simple Face Mask  Additional Equipment:   Intra-op Plan:   Post-operative Plan:   Informed Consent: I have reviewed the patients History and Physical, chart, labs and discussed the procedure including the risks, benefits and alternatives for the proposed anesthesia with the patient or authorized representative who has indicated his/her understanding and acceptance.     Plan Discussed with:   Anesthesia Plan Comments:         Anesthesia Quick Evaluation

## 2012-11-12 NOTE — H&P (View-Only) (Signed)
Primary Care Physician:  Milana Obey, MD  Primary Gastroenterologist:  Roetta Sessions, MD   Chief Complaint  Patient presents with  . Burning sensation when swallows    HPI:  Brent [name] is a 42 y.o. male here for further evaluation of odynophagia.  We last saw the patient in August 2012. He has a history of recurrent pancreatitis, but felt to be related to alcohol use. Initial episode in 2006 per patient. In 2012 he underwent an EUS by Dr. Christella Hartigan that showed a normal esophagus and stomach, irregular duodenal mucosa, question of peptic duodenitis. Biopsy negative for celiac. No changes of chronic pancreatitis or discrete masses. Main pancreatic duct was normal, and common bile duct normal and nondilated without filling defects, no peripancreatic adenopathy.  Since last OV, patient reports being given diagnosis of probable MS. Has been on chronic prednisone since 01/2012. Began with vision changes. States he has been having problems with recurrent URI for several months. Every time he comes off of antibiotics he starts feeling poorly. Has been on Ceftin chronically. Thinks it all started with mold in the house he moved into just before symptoms started last fall. Had recent bronchoscopy by Dr. Juanetta Gosling (08/2012, erythema noted throughout but not lesions). This was done due to abnormal Chest CT in 06/2012 at [facility] that showed abnormalities of upper lobes with background of emphysema. Via bronchoscopy the Pneumocystis, AFB were negative. Few Streptococcus pneumoniae grew on culture. Also grew Candida Glabrata on the fungus culture. States when he gets all congested and has upper airway issues, he has bad hoarseness and odynophagia. Complains of constant substernal pain. +dysphagia to solid food, sometimes food comes back up. Has pain at umbilical hernia at times. Admits to lots of BCs. Sometimes stools are black. Denies Pepto. No brbpr, constipation, diarrhea. Says Dr. Fransico Him has diagnosed him with  thyroid issues and borderline DM.  Weight up from 116 in 04/2010 to 143 now.     Inadequate conscious sedation last year with Dr. Lovell Sheehan at time of TCS.   Current Outpatient Prescriptions  Medication Sig Dispense Refill  . ALPRAZolam (XANAX) 0.5 MG tablet Take 1 mg by mouth 4 (four) times daily as needed. For anxiety      . cefUROXime (CEFTIN) 500 MG tablet Take 500 mg by mouth 2 (two) times daily.      . cetirizine (ZYRTEC) 10 MG tablet Take 10 mg by mouth daily as needed. For allergies       . Cyanocobalamin (B-12 PO) Take 1 tablet by mouth daily.      . fluconazole (DIFLUCAN) 100 MG tablet Take 100 mg by mouth daily.      Marland Kitchen HYDROcodone-acetaminophen (NORCO) 10-325 MG per tablet Take 1 tablet by mouth every 4 (four) hours as needed for pain.      . Multiple Vitamin (MULTIVITAMIN) tablet Take 1 tablet by mouth daily.      . NON FORMULARY Vitamin D3    One tablet daily      . nystatin (MYCOSTATIN) 100000 UNIT/ML suspension Take 100,000 Units by mouth 4 (four) times daily.      Marland Kitchen omeprazole (PRILOSEC) 40 MG capsule Take 40 mg by mouth daily.        . predniSONE (DELTASONE) 20 MG tablet Take 20 mg by mouth 2 (two) times daily.      . traMADol (ULTRAM) 50 MG tablet Take 50 mg by mouth every 6 (six) hours as needed for pain.      Marland Kitchen zolpidem (AMBIEN) 10  MG tablet Take 10 mg by mouth at bedtime as needed for sleep.       No current facility-administered medications for this visit.    Allergies as of 11/03/2012  . (No Known Allergies)    Past Medical History  Diagnosis Date  . Condylomata acuminata in male     multiple procedures  . GERD (gastroesophageal reflux disease)   . Chronic back pain   . Pancreatitis     H/O  . Suicidal ideation     Admission in 2006 for suicidal ideation without attempt  . Anxiety     Disabled due to panic attacks  . Pancreatitis, alcoholic     admission 08/2011  . Multiple sclerosis     Past Surgical History  Procedure Laterality Date  . Egd with ed  by dr. Karilyn Cota  6/06    erosive RE with small sliding hh. HP neg, 80F  . Tcs by dr. Karilyn Cota  12/05    External hemorrhoids  . Electrocautery/dessication of condyloma of penis and perianal area  2009/2011    Dr. Leticia Penna  . Colonoscopy  10/08/2011    Jenkins:Normal colon/Anal condyloma without extension proximal to dentate line  . Flexible bronchoscopy N/A 08/12/2012    Procedure: FLEXIBLE BRONCHOSCOPY;  Surgeon: Fredirick Maudlin, MD;  Location: [facility];  Service: Pulmonary;  Laterality: N/A;    Family History  Problem Relation Age of Onset  . Lung cancer Father 26  . Colon cancer Neg Hx   . Colitis Neg Hx   . Cirrhosis Neg Hx   . Liver disease Neg Hx   . Pancreatic cancer Neg Hx   . Pancreatitis Neg Hx   . Anesthesia problems Neg Hx   . Hypotension Neg Hx   . Malignant hyperthermia Neg Hx   . Pseudochol deficiency Neg Hx     History   Social History  . Marital Status: Single    Spouse Name: N/A    Number of Children: 0  . Years of Education: N/A   Occupational History  . disabled     anxiety   Social History Main Topics  . Smoking status: Current Every Day Smoker -- 20 years    Types: Cigarettes  . Smokeless tobacco: Not on file     Comment: 15 to 20 cigarrettes daily  . Alcohol Use: Yes     Comment: . 2 weeks ago  . Drug Use: No  . Sexual Activity: Not on file   Other Topics Concern  . Not on file   Social History Narrative  . No narrative on file      ROS:  General: Negative for anorexia, weight loss, fever, chills, fatigue, weakness. Eyes: see hpi. 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 ENT: Negative for hoarseness, difficulty swallowing , nasal congestion. CV: Negative for chest pain, angina, palpitations, dyspnea on exertion, peripheral edema.  Respiratory: Negative for dyspnea at rest, dyspnea on exertion. + cough, sputum.  GI: See history of present illness. GU:  Negative for dysuria, hematuria, urinary incontinence, urinary  frequency, nocturnal urination.  MS: Negative for joint pain, low back pain.  Derm: Negative for rash or itching.  Neuro: Negative for weakness, abnormal sensation, seizure, frequent headaches, memory loss, confusion.  Psych: Negative for anxiety, depression, suicidal ideation, hallucinations.  Endo: Negative for unusual weight change.  Heme: Negative for bruising or bleeding. Allergy: Negative for rash or hives.    Physical Examination:  BP 152/95  Pulse 116  Temp(Src) 97.4  F (36.3 C) (Oral)  Ht 5\' 7"  (1.702 m)  Wt 143 lb (64.864 kg)  BMI 22.39 kg/m2   General: Well-nourished, well-developed in no acute distress. Rounded facies. Head: Normocephalic, atraumatic.   Eyes: Conjunctiva pink, no icterus. Mouth: Oropharyngeal mucosa moist and pink , no lesions erythema or exudate. Neck: Supple without thyromegaly, masses, or lymphadenopathy.  Lungs: Clear to auscultation bilaterally.  Heart: Regular rate and rhythm, no murmurs rubs or gallops.  Abdomen: Bowel sounds are normal,epigastric pain, nondistended, no hepatosplenomegaly or masses, no abdominal bruits or    hernia , no rebound or guarding.   Rectal: not performed Extremities: No lower extremity edema. No clubbing or deformities.  Neuro: Alert and oriented x 4 , grossly normal neurologically.  Skin: Warm and dry, no rash or jaundice.   Psych: Alert and cooperative, normal mood and affect.

## 2012-11-12 NOTE — Op Note (Signed)
[facility] [address] [place], [address]   ENDOSCOPY PROCEDURE REPORT  PATIENT: Brent [name], Brent [name]  MR#: [id number] BIRTHDATE: 1970-11-24 , 41  yrs. old GENDER: Male ENDOSCOPIST: R.  Roetta Sessions, MD FACP FACG REFERRED BY:  Gareth Morgan, M.D.  Kari Baars, M.D. PROCEDURE DATE:  11/12/2012 PROCEDURE:     EGD with Elease Hashimoto dilation, esophageal and gastric biopsy  INDICATIONS:     odynophagia and dysphagia  INFORMED CONSENT:   The risks, benefits, limitations, alternatives and imponderables have been discussed.  The potential for biopsy, esophogeal dilation, etc. have also been reviewed.  Questions have been answered.  All parties agreeable.  Please see the history and physical in the medical record for more information.  MEDICATIONS:   Deep sedation per Dr. [facility].  DESCRIPTION OF PROCEDURE:   The     endoscope was introduced through the mouth and advanced to the second portion of the duodenum without difficulty or limitations.  The mucosal surfaces were surveyed very carefully during advancement of the scope and upon withdrawal.  Retroflexion view of the proximal stomach and esophagogastric junction was performed.      FINDINGS: The esophagus appeared normal. Stomach empty.  antral erosions present. Diffuse mottling of the gastric mucosa. No ulcer or infiltrating process. Patent pylorus. Normal first and second portion of the duodenum.  THERAPEUTIC / DIAGNOSTIC MANEUVERS PERFORMED:  A 54 French Maloney dilator was passed to full insertion easily. A look back revealed no complication related to this maneuver. Subsequently, biopsies of the mid and distal esophagus were taken for histologic study. Finally, the abnormal gastric mucosa was biopsied for histologic study   COMPLICATIONS:  None  IMPRESSION:  Normal esophagus  -  status post passage of a Maloney dilator and biopsy. Abnormal gastric mucosa-status post  biopsy  RECOMMENDATIONS:  Continue omeprazole 40 mg daily. Add Carafate suspension 1 g 4 times daily times 10 days. Followup on pathology    _______________________________ R. Roetta Sessions, MD FACP [facility] eSigned:  R. Roetta Sessions, MD FACP [facility] 11/12/2012 10:22 AM     CC:

## 2012-11-12 NOTE — Transfer of Care (Signed)
Immediate Anesthesia Transfer of Care Note  Patient: Brent [name]  Procedure(s) Performed: Procedure(s): ESOPHAGOGASTRODUODENOSCOPY (EGD) WITH PROPOFOL (N/A) MALONEY DILATION (54mm) (N/A) GASTRIC AND ESOPHAGEAL BIOPSIES  Patient Location: PACU  Anesthesia Type:MAC  Level of Consciousness: awake  Airway & Oxygen Therapy: Patient Spontanous Breathing and Patient connected to nasal cannula oxygen  Post-op Assessment: Report given to PACU RN  Post vital signs: Reviewed and stable  Complications: No apparent anesthesia complications

## 2012-11-12 NOTE — Interval H&P Note (Signed)
History and Physical Interval Note:  11/12/2012 9:14 AM  Brent [name]  has presented today for surgery, with the diagnosis of Odynophagia, Epigastric pain, esophageal dysphagia, melena  The various methods of treatment have been discussed with the patient and family. After consideration of risks, benefits and other options for treatment, the patient has consented to  Procedure(s) with comments: ESOPHAGOGASTRODUODENOSCOPY (EGD) WITH PROPOFOL (N/A) - 9:15 SAVORY DILATION (N/A) MALONEY DILATION (N/A) as a surgical intervention .  The patient's history has been reviewed, patient examined, no change in status, stable for surgery.  I have reviewed the patient's chart and labs.  Questions were answered to the patient's satisfaction.     [name]  No change. EGD with esophageal dilation, etc. as appropriate.The risks, benefits, limitations, alternatives and imponderables have been reviewed with the patient. Potential for esophageal dilation, biopsy, etc. have also been reviewed.  Questions have been answered. All parties agreeable.

## 2012-11-16 ENCOUNTER — Encounter (HOSPITAL_COMMUNITY): Payer: Self-pay | Admitting: Internal Medicine

## 2012-11-21 ENCOUNTER — Encounter: Payer: Self-pay | Admitting: Internal Medicine

## 2012-11-24 ENCOUNTER — Encounter: Payer: Self-pay | Admitting: Internal Medicine

## 2012-12-07 ENCOUNTER — Other Ambulatory Visit: Payer: Self-pay

## 2012-12-07 DIAGNOSIS — G4730 Sleep apnea, unspecified: Secondary | ICD-10-CM

## 2012-12-08 ENCOUNTER — Other Ambulatory Visit (HOSPITAL_COMMUNITY): Payer: Self-pay | Admitting: "Endocrinology

## 2012-12-08 DIAGNOSIS — E049 Nontoxic goiter, unspecified: Secondary | ICD-10-CM

## 2012-12-14 ENCOUNTER — Ambulatory Visit (HOSPITAL_COMMUNITY)
Admission: RE | Admit: 2012-12-14 | Discharge: 2012-12-14 | Disposition: A | Discharge status: Home / Self Care | Payer: Medicare Other | Source: Ambulatory Visit | Attending: "Endocrinology | Admitting: "Endocrinology

## 2012-12-14 DIAGNOSIS — E049 Nontoxic goiter, unspecified: Secondary | ICD-10-CM | POA: Insufficient documentation

## 2012-12-17 ENCOUNTER — Other Ambulatory Visit: Payer: Self-pay

## 2013-02-24 ENCOUNTER — Ambulatory Visit: Payer: Medicare Other | Admitting: Gastroenterology

## 2013-02-24 ENCOUNTER — Encounter: Payer: Self-pay | Admitting: Gastroenterology

## 2013-03-22 ENCOUNTER — Encounter: Payer: Self-pay | Admitting: Gastroenterology

## 2013-03-22 ENCOUNTER — Telehealth: Payer: Self-pay | Admitting: Gastroenterology

## 2013-03-22 ENCOUNTER — Ambulatory Visit: Payer: Medicare Other | Admitting: Gastroenterology

## 2013-03-22 NOTE — Telephone Encounter (Signed)
Please send letter.

## 2013-03-22 NOTE — Telephone Encounter (Signed)
Mailed letter °

## 2013-03-22 NOTE — Telephone Encounter (Signed)
Pt was a no show

## 2013-08-18 DIAGNOSIS — H3023 Posterior cyclitis, bilateral: Secondary | ICD-10-CM | POA: Insufficient documentation

## 2013-08-18 DIAGNOSIS — H35373 Puckering of macula, bilateral: Secondary | ICD-10-CM | POA: Insufficient documentation

## 2013-08-18 DIAGNOSIS — Z7952 Long term (current) use of systemic steroids: Secondary | ICD-10-CM | POA: Insufficient documentation

## 2013-08-18 DIAGNOSIS — E0590 Thyrotoxicosis, unspecified without thyrotoxic crisis or storm: Secondary | ICD-10-CM | POA: Insufficient documentation

## 2013-08-18 DIAGNOSIS — H04129 Dry eye syndrome of unspecified lacrimal gland: Secondary | ICD-10-CM | POA: Insufficient documentation

## 2013-11-26 DIAGNOSIS — H35353 Cystoid macular degeneration, bilateral: Secondary | ICD-10-CM | POA: Insufficient documentation

## 2014-01-16 ENCOUNTER — Encounter (HOSPITAL_COMMUNITY): Payer: Self-pay

## 2014-01-16 ENCOUNTER — Emergency Department (HOSPITAL_COMMUNITY)
Admission: EM | Admit: 2014-01-16 | Discharge: 2014-01-16 | Disposition: A | Discharge status: Home / Self Care | Payer: Medicare Other | Attending: Emergency Medicine | Admitting: Emergency Medicine

## 2014-01-16 ENCOUNTER — Emergency Department (HOSPITAL_COMMUNITY): Payer: Medicare Other

## 2014-01-16 DIAGNOSIS — M1990 Unspecified osteoarthritis, unspecified site: Secondary | ICD-10-CM | POA: Diagnosis not present

## 2014-01-16 DIAGNOSIS — K219 Gastro-esophageal reflux disease without esophagitis: Secondary | ICD-10-CM | POA: Insufficient documentation

## 2014-01-16 DIAGNOSIS — R627 Adult failure to thrive: Secondary | ICD-10-CM | POA: Insufficient documentation

## 2014-01-16 DIAGNOSIS — Z8619 Personal history of other infectious and parasitic diseases: Secondary | ICD-10-CM | POA: Insufficient documentation

## 2014-01-16 DIAGNOSIS — G8929 Other chronic pain: Secondary | ICD-10-CM | POA: Insufficient documentation

## 2014-01-16 DIAGNOSIS — Z720 Tobacco use: Secondary | ICD-10-CM | POA: Insufficient documentation

## 2014-01-16 DIAGNOSIS — Z7952 Long term (current) use of systemic steroids: Secondary | ICD-10-CM | POA: Diagnosis not present

## 2014-01-16 DIAGNOSIS — Z7982 Long term (current) use of aspirin: Secondary | ICD-10-CM | POA: Insufficient documentation

## 2014-01-16 DIAGNOSIS — G35 Multiple sclerosis: Secondary | ICD-10-CM | POA: Insufficient documentation

## 2014-01-16 DIAGNOSIS — E119 Type 2 diabetes mellitus without complications: Secondary | ICD-10-CM | POA: Insufficient documentation

## 2014-01-16 DIAGNOSIS — F419 Anxiety disorder, unspecified: Secondary | ICD-10-CM | POA: Diagnosis not present

## 2014-01-16 DIAGNOSIS — R634 Abnormal weight loss: Secondary | ICD-10-CM | POA: Diagnosis not present

## 2014-01-16 DIAGNOSIS — Z7951 Long term (current) use of inhaled steroids: Secondary | ICD-10-CM | POA: Diagnosis not present

## 2014-01-16 DIAGNOSIS — R6251 Failure to thrive (child): Secondary | ICD-10-CM | POA: Diagnosis present

## 2014-01-16 DIAGNOSIS — Z79899 Other long term (current) drug therapy: Secondary | ICD-10-CM | POA: Diagnosis not present

## 2014-01-16 DIAGNOSIS — F329 Major depressive disorder, single episode, unspecified: Secondary | ICD-10-CM | POA: Diagnosis not present

## 2014-01-16 DIAGNOSIS — R1013 Epigastric pain: Secondary | ICD-10-CM | POA: Diagnosis not present

## 2014-01-16 DIAGNOSIS — R1110 Vomiting, unspecified: Secondary | ICD-10-CM | POA: Diagnosis present

## 2014-01-16 LAB — COMPREHENSIVE METABOLIC PANEL
ALBUMIN: 3.5 g/dL (ref 3.5–5.2)
ALK PHOS: 113 U/L (ref 39–117)
ALT: 9 U/L (ref 0–53)
ANION GAP: 12 (ref 5–15)
AST: 15 U/L (ref 0–37)
BUN: 7 mg/dL (ref 6–23)
CALCIUM: 9.1 mg/dL (ref 8.4–10.5)
CO2: 26 mEq/L (ref 19–32)
CREATININE: 0.91 mg/dL (ref 0.50–1.35)
Chloride: 99 mEq/L (ref 96–112)
GFR calc non Af Amer: >90 mL/min (ref >90)
GLUCOSE: 76 mg/dL (ref 70–99)
Potassium: 3.5 mEq/L — ABNORMAL LOW (ref 3.7–5.3)
Sodium: 137 mEq/L (ref 137–147)
Total Bilirubin: 0.4 mg/dL (ref 0.3–1.2)
Total Protein: 6.6 g/dL (ref 6.0–8.3)

## 2014-01-16 LAB — CBC WITH DIFFERENTIAL/PLATELET
Basophils Absolute: 0.1 10*3/uL (ref 0.0–0.1)
Basophils Relative: 1 % (ref 0–1)
EOS ABS: 0.2 10*3/uL (ref 0.0–0.7)
EOS PCT: 2 % (ref 0–5)
HCT: 43.5 % (ref 39.0–52.0)
HEMOGLOBIN: 14.5 g/dL (ref 13.0–17.0)
LYMPHS ABS: 2.5 10*3/uL (ref 0.7–4.0)
Lymphocytes Relative: 29 % (ref 12–46)
MCH: 30.5 pg (ref 26.0–34.0)
MCHC: 33.3 g/dL (ref 30.0–36.0)
MCV: 91.4 fL (ref 78.0–100.0)
MONO ABS: 0.6 10*3/uL (ref 0.1–1.0)
MONOS PCT: 7 % (ref 3–12)
Neutro Abs: 5.3 10*3/uL (ref 1.7–7.7)
Neutrophils Relative %: 61 % (ref 43–77)
Platelets: 358 10*3/uL (ref 150–400)
RBC: 4.76 MIL/uL (ref 4.22–5.81)
RDW: 14.5 % (ref 11.5–15.5)
WBC: 8.8 10*3/uL (ref 4.0–10.5)

## 2014-01-16 LAB — LIPASE, BLOOD: LIPASE: 48 U/L (ref 11–59)

## 2014-01-16 MED ORDER — FENTANYL CITRATE 0.05 MG/ML IJ SOLN
50.0000 ug | Freq: Once | INTRAMUSCULAR | Status: AC
  Administered 2014-01-16: 50 ug via INTRAVENOUS
  Filled 2014-01-16: qty 2

## 2014-01-16 MED ORDER — SODIUM CHLORIDE 0.9 % IV BOLUS (SEPSIS)
1000.0000 mL | Freq: Once | INTRAVENOUS | Status: AC
  Administered 2014-01-16: 1000 mL via INTRAVENOUS

## 2014-01-16 MED ORDER — PREDNISONE 50 MG PO TABS: ORAL_TABLET | ORAL | Status: DC

## 2014-01-16 MED ORDER — PROMETHAZINE HCL 25 MG PO TABS: 25.0000 mg | ORAL_TABLET | Freq: Four times a day (QID) | ORAL | Status: DC | PRN

## 2014-01-16 MED ORDER — ONDANSETRON HCL 4 MG/2ML IJ SOLN
4.0000 mg | Freq: Once | INTRAMUSCULAR | Status: AC
  Administered 2014-01-16: 4 mg via INTRAVENOUS
  Filled 2014-01-16: qty 2

## 2014-01-16 NOTE — ED Provider Notes (Addendum)
CSN: [id number]     Arrival date & time 01/16/14  2002 History   First MD Initiated Contact with Patient 01/16/14 2022     Chief Complaint  Patient presents with  . Emesis     (Consider location/radiation/quality/duration/timing/severity/associated sxs/prior Treatment) HPI.... Epigastric abdominal pain, poor appetite for several months. Intermittent vomiting recently. No fever sweats or chills. Patient has a primary care physician the community. He has stopped alcohol. Severity is mild to moderate. Nothing makes symptoms better or worse. No radiation of pain. No chest pain, dyspnea, dysuria  Past Medical History  Diagnosis Date  . Condylomata acuminata in male     multiple procedures  . GERD (gastroesophageal reflux disease)   . Chronic back pain   . Pancreatitis     H/O  . Suicidal ideation     Admission in 2006 for suicidal ideation without attempt  . Anxiety     Disabled due to panic attacks  . Pancreatitis, alcoholic     admission 08/2011  . Multiple sclerosis     pt. states has 4 small brain lesions  . Shortness of breath     pt. states with exertion & at night  . Diabetes mellitus without complication     borederline  . Headache(784.0)   . Arthritis   . Depression   . Eye inflammation    Past Surgical History  Procedure Laterality Date  . Egd with ed by dr. Karilyn Cotarehman  6/06    erosive RE with small sliding hh. HP neg, 23F  . Tcs by dr. Karilyn Cotarehman  12/05    External hemorrhoids  . Electrocautery/dessication of condyloma of penis and perianal area  2009/2011    Dr. Leticia PennaZiegler  . Colonoscopy  10/08/2011    Jenkins:Normal colon/Anal condyloma without extension proximal to dentate line  . Flexible bronchoscopy N/A 08/12/2012    Procedure: FLEXIBLE BRONCHOSCOPY;  Surgeon: Fredirick MaudlinEdward L Hawkins, MD;  Location: [facility];  Service: Pulmonary;  Laterality: N/A;  . Esophagogastroduodenoscopy (egd) with propofol N/A 11/12/2012    XLK:GMWNUURMR:Normal esophagus s/p  passage of a Maloney dilator and biopsy.  Abnormal gastric mucosa-status post biopsy  . Maloney dilation N/A 11/12/2012    Procedure: MALONEY DILATION (54mm);  Surgeon: Corbin Adeobert M Rourk, MD;  Location: [facility];  Service: Endoscopy;  Laterality: N/A;  . Esophageal biopsy  11/12/2012    Procedure: GASTRIC AND ESOPHAGEAL BIOPSIES;  Surgeon: Corbin Adeobert M Rourk, MD;  Location: [facility];  Service: Endoscopy;;   Family History  Problem Relation Age of Onset  . Lung cancer Father [id number]  . Colon cancer Neg Hx   . Colitis Neg Hx   . Cirrhosis Neg Hx   . Liver disease Neg Hx   . Pancreatic cancer Neg Hx   . Pancreatitis Neg Hx   . Anesthesia problems Neg Hx   . Hypotension Neg Hx   . Malignant hyperthermia Neg Hx   . Pseudochol deficiency Neg Hx    History  Substance Use Topics  . Smoking status: Current Every Day Smoker -- 1.00 packs/day for 20 years    Types: Cigarettes  . Smokeless tobacco: Not on file     Comment: 15 to 20 cigarrettes daily  . Alcohol Use: No     Comment:  none since 08/2011    Review of Systems  All other systems reviewed and are negative.     Allergies  Review of patient's allergies indicates no known allergies.  Home Medications   Prior to Admission medications   Medication  Sig Start Date End Date Taking? Authorizing Provider  ALPRAZolam Prudy Feeler) 0.5 MG tablet Take 1 mg by mouth 4 (four) times daily. For anxiety 05/03/10  Yes Historical Provider, MD  Aspirin-Salicylamide-Caffeine (BC HEADACHE POWDER PO) Take 1 packet by mouth daily.   Yes Historical Provider, MD  Cholecalciferol (VITAMIN D3) 5000 UNITS CAPS Take 1 capsule by mouth daily.   Yes Historical Provider, MD  Cyanocobalamin (B-12 PO) Take 1 tablet by mouth daily.   Yes Historical Provider, MD  fluticasone (FLONASE) 50 MCG/ACT nasal spray Place 1 spray into both nostrils daily.   Yes Historical Provider, MD  HYDROcodone-acetaminophen (NORCO) 10-325 MG per tablet Take 1 tablet by mouth 4 (four) times daily.    Yes Historical Provider, MD  hydroxypropyl  methylcellulose / hypromellose (ISOPTO TEARS / GONIOVISC) 2.5 % ophthalmic solution Place 1 drop into both eyes as needed for dry eyes.   Yes Historical Provider, MD  ketorolac (ACULAR) 0.5 % ophthalmic solution Place 1 drop into both eyes 4 (four) times daily.   Yes Historical Provider, MD  Multiple Vitamin (MULTIVITAMIN) tablet Take 1 tablet by mouth daily.   Yes Historical Provider, MD  omeprazole (PRILOSEC) 40 MG capsule Take 40 mg by mouth daily.     Yes Historical Provider, MD  prednisoLONE acetate (PRED FORTE) 1 % ophthalmic suspension Place 1 drop into both eyes 4 (four) times daily.   Yes Historical Provider, MD  zolpidem (AMBIEN) 10 MG tablet Take 10 mg by mouth at bedtime.    Yes Historical Provider, MD  predniSONE (DELTASONE) 50 MG tablet 1 tablet for 6 days, 1/2 tab for 6 days 01/16/14   Donnetta Hutching, MD  promethazine (PHENERGAN) 25 MG tablet Take 1 tablet (25 mg total) by mouth every 6 (six) hours as needed. 01/16/14   Donnetta Hutching, MD   BP 100/83 mmHg  Pulse 101  Temp(Src) 98.6 F (37 C) (Oral)  Resp 16  Ht 5\' 7"  (1.702 m)  Wt 112 lb 4.8 oz (50.939 kg)  BMI 17.58 kg/m2  SpO2 100% Physical Exam  Constitutional: He is oriented to person, place, and time.  Pale, alert, no acute distress  HENT:  Head: Normocephalic and atraumatic.  Eyes: Conjunctivae and EOM are normal. Pupils are equal, round, and reactive to light.  Neck: Normal range of motion. Neck supple.  Cardiovascular: Normal rate, regular rhythm and normal heart sounds.   Pulmonary/Chest: Effort normal and breath sounds normal.  Abdominal: Soft. Bowel sounds are normal.  Minimal epigastric tenderness  Musculoskeletal: Normal range of motion.  Neurological: He is alert and oriented to person, place, and time.  Skin: Skin is warm and dry.  Psychiatric: He has a normal mood and affect. His behavior is normal.  Nursing note and vitals reviewed.   ED Course  Procedures (including critical care time) Labs Review Labs  Reviewed  CBC WITH DIFFERENTIAL  COMPREHENSIVE METABOLIC PANEL  LIPASE, BLOOD  URINALYSIS, ROUTINE W REFLEX MICROSCOPIC    Imaging Review Dg Chest 2 View  01/16/2014   CLINICAL DATA:  Vomiting for 2 weeks. Mid abdominal pain for several years with increased today. Rectal bleeding. 30 lb weight loss over 2 weeks. History of diabetes, pancreatitis, MS.  EXAM: CHEST  2 VIEW  COMPARISON:  09/19/2010  FINDINGS: Normal heart size and pulmonary vascularity. Emphysematous changes in the lungs. Increasing scarring in the left upper lung since prior study. No focal airspace disease or consolidation. No blunting of costophrenic angles. No pneumothorax.  IMPRESSION: Emphysematous changes and scarring in  the lungs. No evidence of active pulmonary disease.   Electronically Signed   By: Burman Nieves M.D.   On: 01/16/2014 21:14     EKG Interpretation None      MDM   Final diagnoses:  Weight loss  Failure to thrive (0-17)    Patient is nontoxic-appearing. IV fluids. Routine labs. Discharge medications Phenergan 25 mg and prednisone. Patient decided to leave prior to laboratory evaluation. Chest x-ray shows emphysematous changes.    Donnetta Hutching, MD 01/16/14 [id number]  Donnetta Hutching, MD 01/16/14 [id number]  Donnetta Hutching, MD 01/22/14 [id number]

## 2014-01-16 NOTE — ED Notes (Signed)
Vomiting intermittently for 2 weeks, pain mid abd increasing today.

## 2014-01-16 NOTE — Discharge Instructions (Signed)
Failure to Thrive, Adult  Adult failure to thrive is a condition that some older people can get over time. These people are not able to do things they once could. They may not be interested in the things they once liked. This is not a normal part of aging. Treatment is directed at the cause. Sometimes, treatment is not possible if a cause cannot be found. HOME CARE Home care depends on the cause of the condition. Basic home care includes:  Taking all medicines as told by the doctor.  Eating healthy foods.  Asking the doctor about taking vitamins, herbs, or nutrition drinks.  Exercising.  Making sure the home is safe.  Understanding what to do when the person can no longer make decisions on his or her own. GET HELP RIGHT AWAY IF:  The person has thoughts about ending his or her life.  The person cannot eat or drink.  The person does not get out of bed.  Staying at home is not safe.  The person has a fever.  There are questions about medicines.  There are questions about treatment effects.  The person is not able to eat well.  The person is not able to move around.  The person feels very sad or hopeless. MAKE SURE YOU:  Understand these instructions.  Will watch the person's condition.  Will get help right away if the person is not doing well or gets worse. Document Released: 01/17/2011 Document Revised: 04/22/2011 Document Reviewed: 01/17/2011 [facility] Patient Information 2015 [place], [place]. This information is not intended to replace advice given to you by your health care provider. Make sure you discuss any questions you have with your health care provider.   Prescription for prednisone and nausea medication. F/U your primary care doctor.

## 2014-01-22 DIAGNOSIS — R6251 Failure to thrive (child): Secondary | ICD-10-CM | POA: Diagnosis present

## 2014-03-15 IMAGING — US US SOFT TISSUE HEAD/NECK
1 series · 14 of 25 positions shown · non-contrast
Comparison: None

CLINICAL DATA: Goiter

EXAM:
THYROID ULTRASOUND
TECHNIQUE: Ultrasound examination of the thyroid gland and adjacent soft
tissues was performed.

[Series 1: us soft tissue head/neck · 0.06mm/px · 14 of 31 slices shown]
[im 1/31]
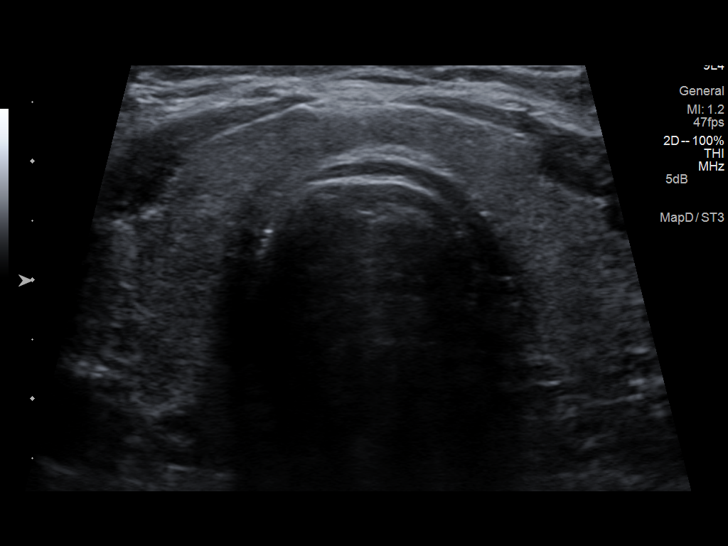
[im 3/31]
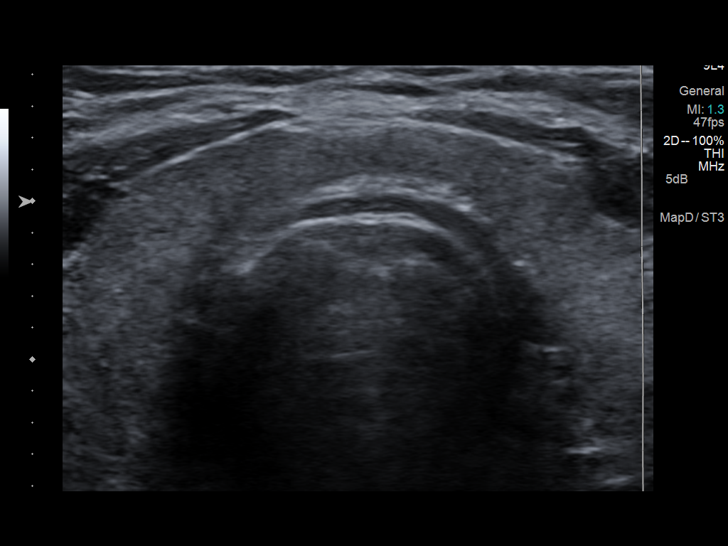
[im 6/31]
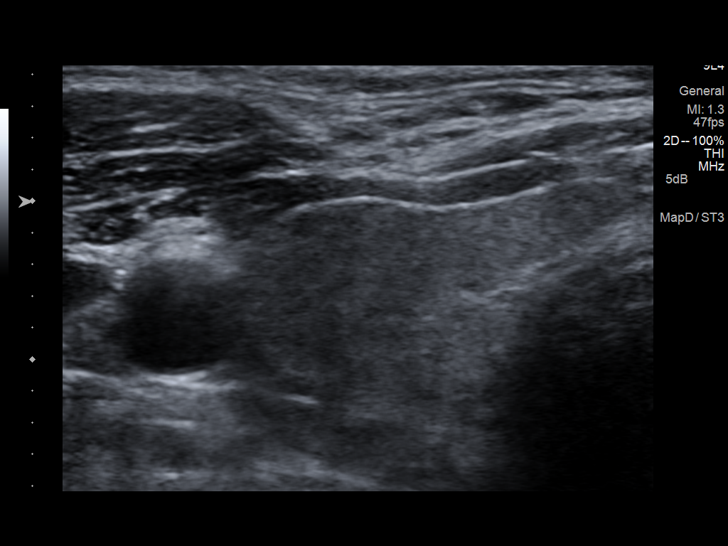
[im 8/31]
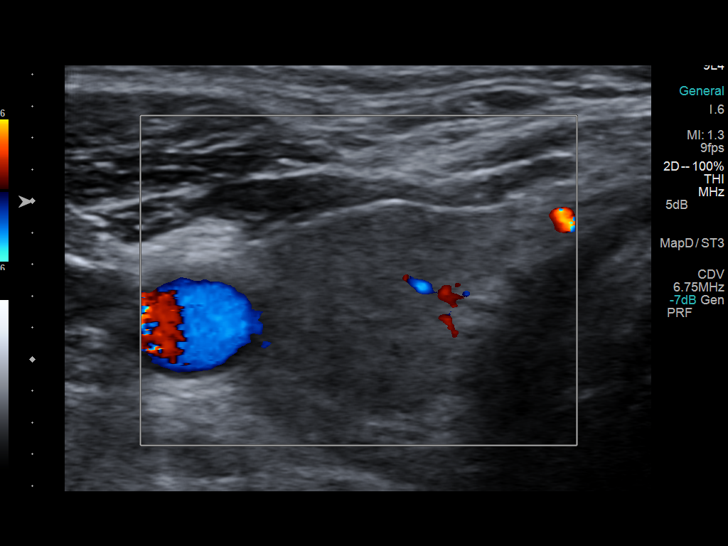
[im 11/31]
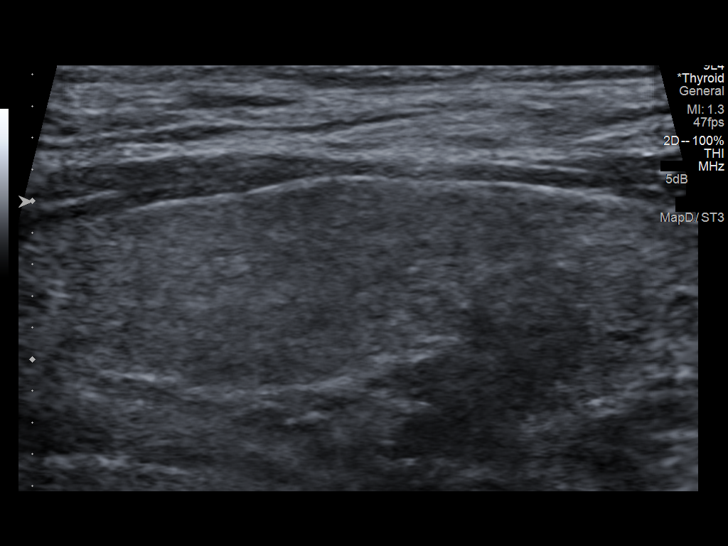
[im 12/31]
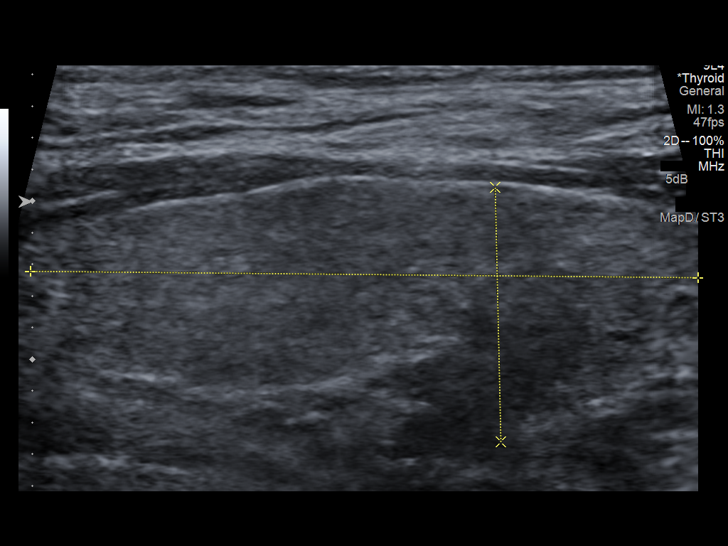
[im 14/31]
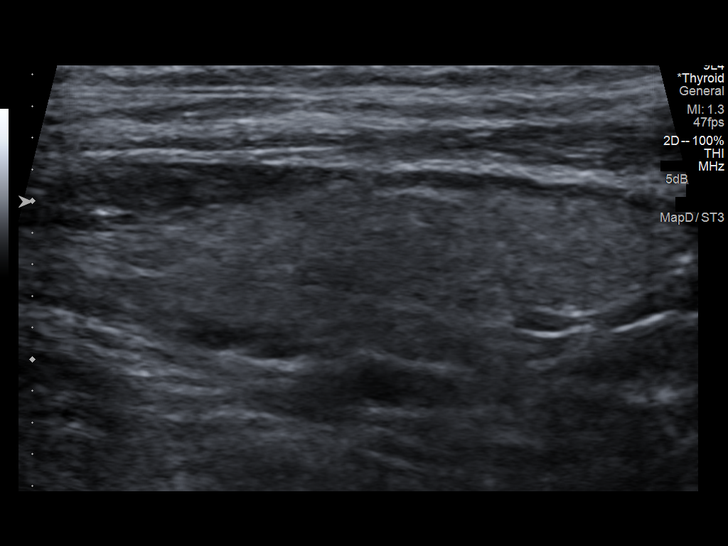
[im 17/31]
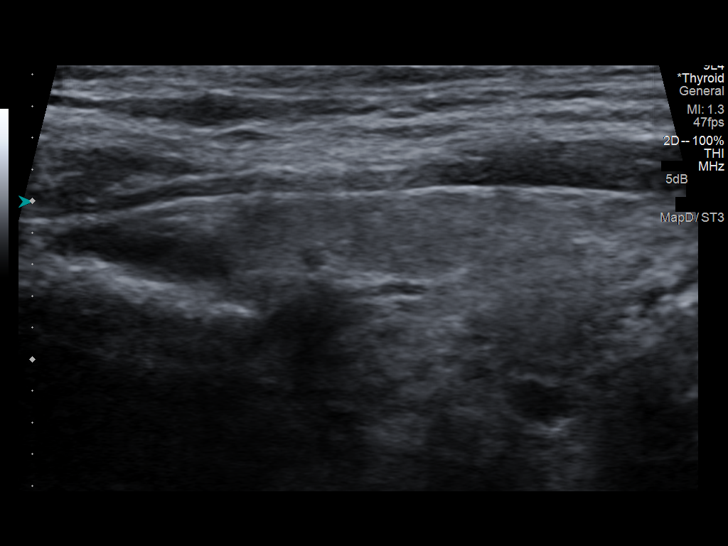
[im 19/31]
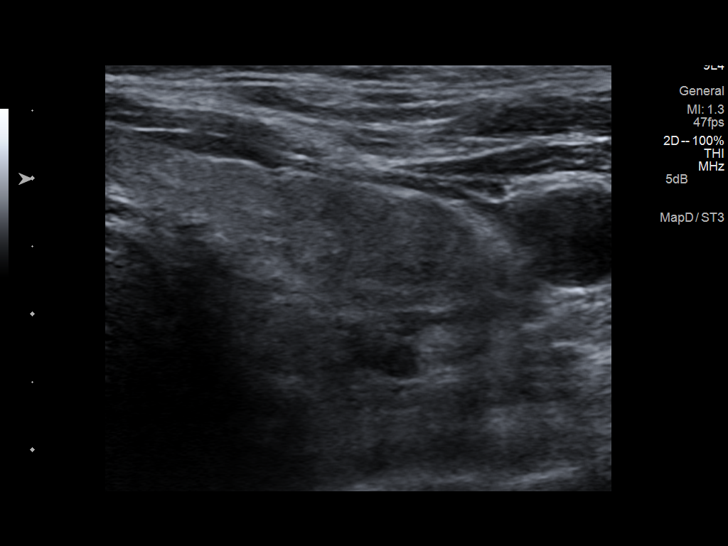
[im 21/31]
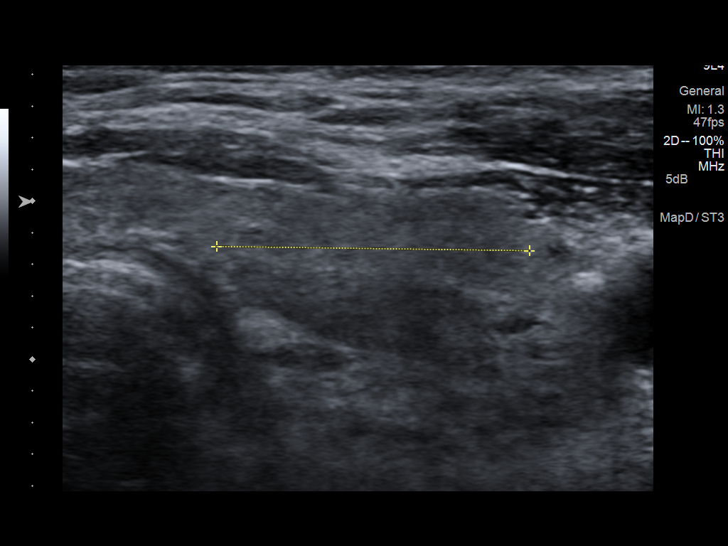
[im 23/31]
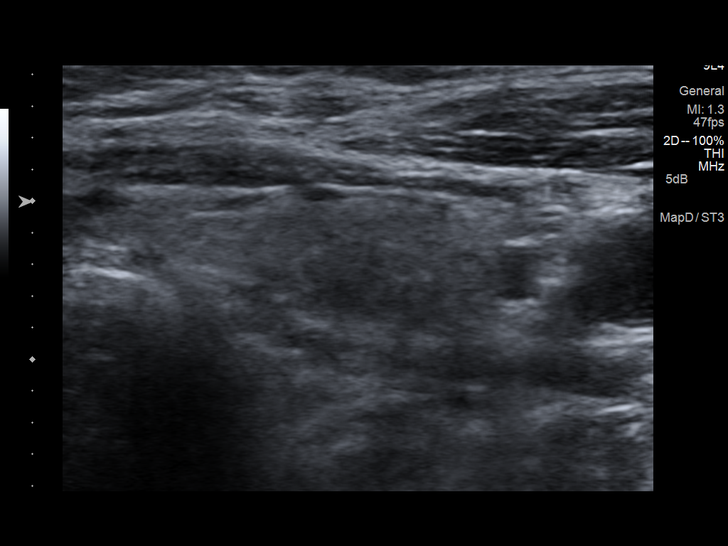
[im 26/31]
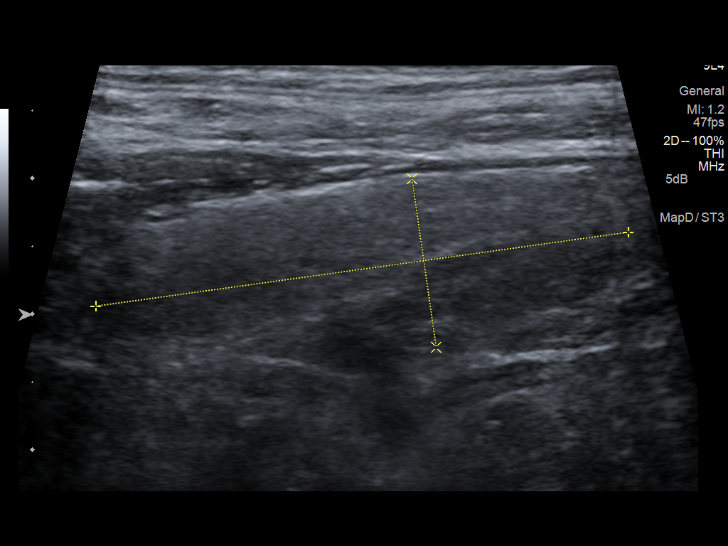
[im 28/31]
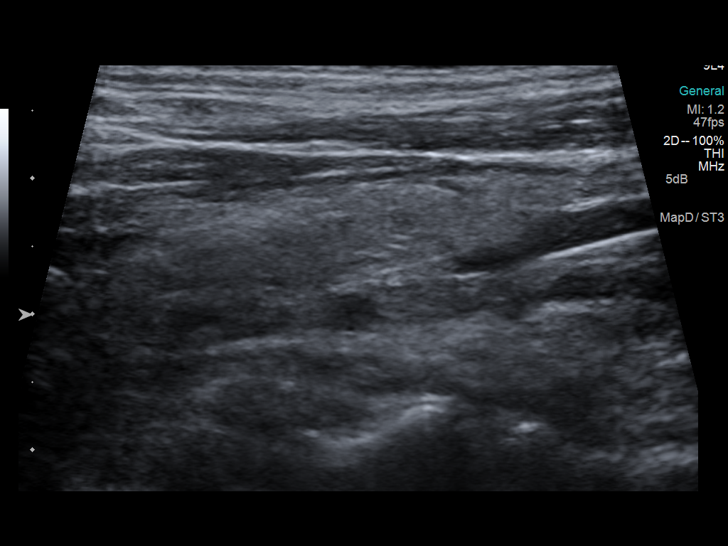
[im 31/31]
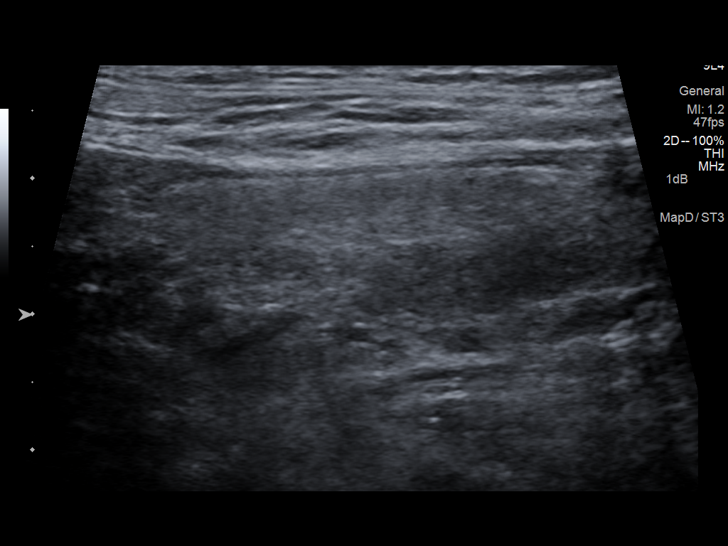

[14 of 25 positions shown; findings below may reference images not displayed]

FINDINGS: Right thyroid lobe

Measurements: 4.2 x 1.6 x 1.6 cm. Homogeneous echogenicity without
nodule or calcification.

Left thyroid lobe

Measurements: 4.0 x 1.3 x 2.0 cm. Normal echogenicity without nodule
or calcification.

Isthmus

Thickness: 3 mm thick.  No nodules visualized.

Lymphadenopathy

None visualized.
IMPRESSION: Normal thyroid ultrasound.

## 2014-04-13 DIAGNOSIS — M797 Fibromyalgia: Secondary | ICD-10-CM | POA: Insufficient documentation

## 2014-04-19 NOTE — H&P (Signed)
HISTORY AND PHYSICAL  Brent [name] is a 44 y.o. male patient with CC: painful teeth  No diagnosis found.  No past medical history on file.  No current facility-administered medications for this encounter.   No current outpatient prescriptions on file.   Allergies not on file Active Problems:   * No active hospital problems. *  Vitals: There were no vitals taken for this visit. Lab results:No results found for this or any previous visit (from the past 24 hour(s)). Radiology Results: No results found. General appearance: alert, cooperative and no distress Head: Normocephalic, without obvious abnormality, atraumatic Eyes: negative Nose: Nares normal. Septum midline. Mucosa normal. No drainage or sinus tenderness. Throat: Multiple decayed teeth # 2, 3, 5, 6, 7, 8, 9, 10, 11, 13, 14, 15, 21, 28; pharynx clear, no lesions, no trismus Neck: no adenopathy, supple, symmetrical, trachea midline and thyroid not enlarged, symmetric, no tenderness/mass/nodules Resp: clear to auscultation bilaterally Cardio: regular rate and rhythm, S1, S2 normal, no murmur, click, rub or gallop  Assessment:Non-restorable teeth #'s 2, 3, 5, 6, 7,l 8, 9, 10, 11, 13, 14, 15, 21, 28  Plan:Dental extractions, alveoloplasty. General anesthesia. Day surgery.   Georgia Lopes 04/19/2014

## 2014-04-20 ENCOUNTER — Encounter (HOSPITAL_COMMUNITY)
Admission: RE | Admit: 2014-04-20 | Discharge: 2014-04-20 | Disposition: A | Discharge status: Home / Self Care | Payer: Medicare Other | Source: Ambulatory Visit | Attending: Oral Surgery | Admitting: Oral Surgery

## 2014-04-20 ENCOUNTER — Encounter (HOSPITAL_COMMUNITY): Payer: Self-pay

## 2014-04-20 ENCOUNTER — Other Ambulatory Visit (HOSPITAL_COMMUNITY): Payer: Self-pay | Admitting: *Deleted

## 2014-04-20 DIAGNOSIS — Z79899 Other long term (current) drug therapy: Secondary | ICD-10-CM | POA: Diagnosis not present

## 2014-04-20 DIAGNOSIS — K083 Retained dental root: Secondary | ICD-10-CM | POA: Diagnosis not present

## 2014-04-20 DIAGNOSIS — K219 Gastro-esophageal reflux disease without esophagitis: Secondary | ICD-10-CM | POA: Diagnosis not present

## 2014-04-20 DIAGNOSIS — G8929 Other chronic pain: Secondary | ICD-10-CM | POA: Diagnosis not present

## 2014-04-20 DIAGNOSIS — M1990 Unspecified osteoarthritis, unspecified site: Secondary | ICD-10-CM | POA: Diagnosis not present

## 2014-04-20 DIAGNOSIS — M797 Fibromyalgia: Secondary | ICD-10-CM | POA: Diagnosis not present

## 2014-04-20 DIAGNOSIS — M2629 Other anomalies of dental arch relationship: Secondary | ICD-10-CM | POA: Diagnosis present

## 2014-04-20 DIAGNOSIS — F17200 Nicotine dependence, unspecified, uncomplicated: Secondary | ICD-10-CM | POA: Diagnosis not present

## 2014-04-20 DIAGNOSIS — M549 Dorsalgia, unspecified: Secondary | ICD-10-CM | POA: Diagnosis not present

## 2014-04-20 DIAGNOSIS — I10 Essential (primary) hypertension: Secondary | ICD-10-CM | POA: Diagnosis not present

## 2014-04-20 DIAGNOSIS — G35 Multiple sclerosis: Secondary | ICD-10-CM | POA: Diagnosis not present

## 2014-04-20 HISTORY — DX: Fibromyalgia: M79.7

## 2014-04-20 HISTORY — DX: Essential (primary) hypertension: I10

## 2014-04-20 HISTORY — DX: Unspecified iridocyclitis: H20.9

## 2014-04-20 HISTORY — DX: Retinal vasculitis, unspecified eye: H35.069

## 2014-04-20 LAB — BASIC METABOLIC PANEL
Anion gap: 8 (ref 5–15)
BUN: 7 mg/dL (ref 6–23)
CALCIUM: 8.8 mg/dL (ref 8.4–10.5)
CO2: 23 mmol/L (ref 19–32)
Chloride: 105 mmol/L (ref 96–112)
Creatinine, Ser: 0.66 mg/dL (ref 0.50–1.35)
GFR calc Af Amer: >90 mL/min (ref >90)
GLUCOSE: 143 mg/dL — AB (ref 70–99)
Potassium: 3.9 mmol/L (ref 3.5–5.1)
SODIUM: 136 mmol/L (ref 135–145)

## 2014-04-20 LAB — CBC
HCT: 42.8 % (ref 39.0–52.0)
Hemoglobin: 14.4 g/dL (ref 13.0–17.0)
MCH: 31.7 pg (ref 26.0–34.0)
MCHC: 33.6 g/dL (ref 30.0–36.0)
MCV: 94.3 fL (ref 78.0–100.0)
Platelets: 330 10*3/uL (ref 150–400)
RBC: 4.54 MIL/uL (ref 4.22–5.81)
RDW: 14.8 % (ref 11.5–15.5)
WBC: 11.7 10*3/uL — AB (ref 4.0–10.5)

## 2014-04-20 NOTE — Pre-Procedure Instructions (Signed)
Brent [name]  04/20/2014   Your procedure is scheduled on:  Friday, April 22, 2014 at 10:30 AM.   Report to [facility] Entrance "A" Admitting Office at 8:30 AM.   Call this number if you have problems the morning of surgery: [phone]               Any questions prior to day of surgery, please call [phone] between 8 & 4 PM.   Remember:   Do not eat food or drink liquids after midnight Thursday, 04/21/14.   Take these medicines the morning of surgery with A SIP OF WATER: Alprazolam (Xanax), Hydrocodone (Norco), Omeprazole (Prilosec), May use your eye drops, Flonase - if needed  Stop BC Powders and Vitamins as of today if you've not already done so.   Do not wear jewelry.  Do not wear lotions, powders, or cologne. You may wear deodorant.  Do not shave 48 hours prior to surgery. Men may shave face and neck.  Do not bring valuables to the hospital.  [facility] is not responsible                  for any belongings or valuables.               Contacts, dentures or bridgework may not be worn into surgery.  Leave suitcase in the car. After surgery it may be brought to your room.  For patients admitted to the hospital, discharge time is determined by your                treatment team.               Patients discharged the day of surgery will not be allowed to drive home.    Special Instructions: [facility] - Preparing for Surgery  Before surgery, you can play an important role.  Because skin is not sterile, your skin needs to be as free of germs as possible.  You can reduce the number of germs on you skin by washing with CHG (chlorahexidine gluconate) soap before surgery.  CHG is an antiseptic cleaner which kills germs and bonds with the skin to continue killing germs even after washing.  Please DO NOT use if you have an allergy to CHG or antibacterial soaps.  If your skin becomes reddened/irritated stop using the CHG and inform your nurse when you arrive at Short  Stay.  Do not shave (including legs and underarms) for at least 48 hours prior to the first CHG shower.  You may shave your face.  Please follow these instructions carefully:   1.  Shower with CHG Soap the night before surgery and the                                morning of Surgery.  2.  If you choose to wash your hair, wash your hair first as usual with your       normal shampoo.  3.  After you shampoo, rinse your hair and body thoroughly to remove the                      Shampoo.  4.  Use CHG as you would any other liquid soap.  You can apply chg directly       to the skin and wash gently with scrungie or a clean washcloth.  5.  Apply the CHG  Soap to your body ONLY FROM THE NECK DOWN.        Do not use on open wounds or open sores.  Avoid contact with your eyes, ears, mouth and genitals (private parts).  Wash genitals (private parts) with your normal soap.  6.  Wash thoroughly, paying special attention to the area where your surgery        will be performed.  7.  Thoroughly rinse your body with warm water from the neck down.  8.  DO NOT shower/wash with your normal soap after using and rinsing off       the CHG Soap.  9.  Pat yourself dry with a clean towel.            10.  Wear clean pajamas.            11.  Place clean sheets on your bed the night of your first shower and do not        sleep with pets.  Day of Surgery  Do not apply any lotions the morning of surgery.  Please wear clean clothes to the hospital.     Please read over the following fact sheets that you were given: Pain Booklet, Coughing and Deep Breathing and Surgical Site Infection Prevention

## 2014-04-21 MED ORDER — CEFAZOLIN SODIUM-DEXTROSE 2-3 GM-% IV SOLR
2.0000 g | INTRAVENOUS | Status: AC
  Administered 2014-04-22: 2 g via INTRAVENOUS
  Filled 2014-04-21: qty 50

## 2014-04-22 ENCOUNTER — Encounter (HOSPITAL_COMMUNITY)
Admission: RE | Disposition: A | Discharge status: Home / Self Care | Payer: Self-pay | Source: Ambulatory Visit | Attending: Oral Surgery

## 2014-04-22 ENCOUNTER — Encounter (HOSPITAL_COMMUNITY): Payer: Self-pay | Admitting: *Deleted

## 2014-04-22 ENCOUNTER — Ambulatory Visit (HOSPITAL_COMMUNITY): Payer: Medicare Other | Admitting: Anesthesiology

## 2014-04-22 ENCOUNTER — Ambulatory Visit (HOSPITAL_COMMUNITY)
Admission: RE | Admit: 2014-04-22 | Discharge: 2014-04-22 | Disposition: A | Discharge status: Home / Self Care | Payer: Medicare Other | Source: Ambulatory Visit | Attending: Oral Surgery | Admitting: Oral Surgery

## 2014-04-22 DIAGNOSIS — M797 Fibromyalgia: Secondary | ICD-10-CM | POA: Diagnosis not present

## 2014-04-22 DIAGNOSIS — M549 Dorsalgia, unspecified: Secondary | ICD-10-CM | POA: Insufficient documentation

## 2014-04-22 DIAGNOSIS — K083 Retained dental root: Secondary | ICD-10-CM | POA: Diagnosis not present

## 2014-04-22 DIAGNOSIS — K219 Gastro-esophageal reflux disease without esophagitis: Secondary | ICD-10-CM | POA: Insufficient documentation

## 2014-04-22 DIAGNOSIS — G8929 Other chronic pain: Secondary | ICD-10-CM | POA: Insufficient documentation

## 2014-04-22 DIAGNOSIS — G35 Multiple sclerosis: Secondary | ICD-10-CM | POA: Insufficient documentation

## 2014-04-22 DIAGNOSIS — Z79899 Other long term (current) drug therapy: Secondary | ICD-10-CM | POA: Insufficient documentation

## 2014-04-22 DIAGNOSIS — I10 Essential (primary) hypertension: Secondary | ICD-10-CM | POA: Diagnosis not present

## 2014-04-22 DIAGNOSIS — M1990 Unspecified osteoarthritis, unspecified site: Secondary | ICD-10-CM | POA: Insufficient documentation

## 2014-04-22 DIAGNOSIS — F17200 Nicotine dependence, unspecified, uncomplicated: Secondary | ICD-10-CM | POA: Insufficient documentation

## 2014-04-22 HISTORY — PX: MULTIPLE EXTRACTIONS WITH ALVEOLOPLASTY: SHX5342

## 2014-04-22 SURGERY — MULTIPLE EXTRACTION WITH ALVEOLOPLASTY
Anesthesia: General | Site: Mouth

## 2014-04-22 MED ORDER — GLYCOPYRROLATE 0.2 MG/ML IJ SOLN
INTRAMUSCULAR | Status: AC
  Filled 2014-04-22: qty 3

## 2014-04-22 MED ORDER — NEOSTIGMINE METHYLSULFATE 10 MG/10ML IV SOLN
INTRAVENOUS | Status: AC
  Filled 2014-04-22: qty 1

## 2014-04-22 MED ORDER — LACTATED RINGERS IV SOLN
INTRAVENOUS | Status: DC | PRN
  Administered 2014-04-22 (×2): via INTRAVENOUS

## 2014-04-22 MED ORDER — PROPOFOL 10 MG/ML IV BOLUS
INTRAVENOUS | Status: DC | PRN
  Administered 2014-04-22: 170 mg via INTRAVENOUS

## 2014-04-22 MED ORDER — FENTANYL CITRATE 0.05 MG/ML IJ SOLN
INTRAMUSCULAR | Status: AC
  Filled 2014-04-22: qty 5

## 2014-04-22 MED ORDER — OXYMETAZOLINE HCL 0.05 % NA SOLN
NASAL | Status: DC | PRN
  Administered 2014-04-22: 1 via NASAL

## 2014-04-22 MED ORDER — FENTANYL CITRATE 0.05 MG/ML IJ SOLN
25.0000 ug | INTRAMUSCULAR | Status: DC | PRN
  Administered 2014-04-22 (×2): 50 ug via INTRAVENOUS

## 2014-04-22 MED ORDER — OXYCODONE HCL 5 MG PO TABS: 5.0000 mg | ORAL_TABLET | Freq: Once | ORAL | Status: DC | PRN

## 2014-04-22 MED ORDER — GLYCOPYRROLATE 0.2 MG/ML IJ SOLN
INTRAMUSCULAR | Status: DC | PRN
  Administered 2014-04-22: 0.6 mg via INTRAVENOUS

## 2014-04-22 MED ORDER — LIDOCAINE-EPINEPHRINE 2 %-1:100000 IJ SOLN
INTRAMUSCULAR | Status: DC | PRN
  Administered 2014-04-22 (×2): 20 mL via INTRADERMAL

## 2014-04-22 MED ORDER — MIDAZOLAM HCL 2 MG/2ML IJ SOLN
INTRAMUSCULAR | Status: AC
  Filled 2014-04-22: qty 2

## 2014-04-22 MED ORDER — LACTATED RINGERS IV SOLN
INTRAVENOUS | Status: DC
  Administered 2014-04-22: 08:00:00 via INTRAVENOUS

## 2014-04-22 MED ORDER — ARTIFICIAL TEARS OP OINT
TOPICAL_OINTMENT | OPHTHALMIC | Status: DC | PRN
  Administered 2014-04-22: 1 via OPHTHALMIC

## 2014-04-22 MED ORDER — FENTANYL CITRATE 0.05 MG/ML IJ SOLN
INTRAMUSCULAR | Status: AC
  Administered 2014-04-22: 50 ug via INTRAVENOUS
  Filled 2014-04-22: qty 2

## 2014-04-22 MED ORDER — LIDOCAINE-EPINEPHRINE 2 %-1:100000 IJ SOLN
INTRAMUSCULAR | Status: AC
  Filled 2014-04-22: qty 1

## 2014-04-22 MED ORDER — NEOSTIGMINE METHYLSULFATE 10 MG/10ML IV SOLN
INTRAVENOUS | Status: DC | PRN
  Administered 2014-04-22: 5 mg via INTRAVENOUS

## 2014-04-22 MED ORDER — LIDOCAINE HCL (CARDIAC) 20 MG/ML IV SOLN
INTRAVENOUS | Status: DC | PRN
  Administered 2014-04-22: 40 mg via INTRAVENOUS

## 2014-04-22 MED ORDER — PROPOFOL 10 MG/ML IV BOLUS
INTRAVENOUS | Status: AC
  Filled 2014-04-22: qty 20

## 2014-04-22 MED ORDER — MIDAZOLAM HCL 5 MG/5ML IJ SOLN
INTRAMUSCULAR | Status: DC | PRN
  Administered 2014-04-22: 2 mg via INTRAVENOUS

## 2014-04-22 MED ORDER — ONDANSETRON HCL 4 MG/2ML IJ SOLN
INTRAMUSCULAR | Status: DC | PRN
  Administered 2014-04-22: 4 mg via INTRAVENOUS

## 2014-04-22 MED ORDER — OXYCODONE HCL 5 MG/5ML PO SOLN: 5.0000 mg | Freq: Once | ORAL | Status: DC | PRN

## 2014-04-22 MED ORDER — OXYCODONE-ACETAMINOPHEN 10-325 MG PO TABS: 1.0000 | ORAL_TABLET | ORAL | Status: DC | PRN

## 2014-04-22 MED ORDER — 0.9 % SODIUM CHLORIDE (POUR BTL) OPTIME
TOPICAL | Status: DC | PRN
  Administered 2014-04-22: 1000 mL

## 2014-04-22 MED ORDER — FENTANYL CITRATE 0.05 MG/ML IJ SOLN
INTRAMUSCULAR | Status: DC | PRN
  Administered 2014-04-22: 50 ug via INTRAVENOUS
  Administered 2014-04-22: 100 ug via INTRAVENOUS

## 2014-04-22 MED ORDER — HYDROCORTISONE NA SUCCINATE PF 100 MG IJ SOLR
INTRAMUSCULAR | Status: DC | PRN
  Administered 2014-04-22: 100 mg via INTRAVENOUS

## 2014-04-22 MED ORDER — ONDANSETRON HCL 4 MG/2ML IJ SOLN
INTRAMUSCULAR | Status: AC
Start: 2014-04-22 — End: 2014-04-22
  Filled 2014-04-22: qty 2

## 2014-04-22 MED ORDER — LIDOCAINE HCL (CARDIAC) 20 MG/ML IV SOLN
INTRAVENOUS | Status: AC
  Filled 2014-04-22: qty 5

## 2014-04-22 MED ORDER — ONDANSETRON HCL 4 MG/2ML IJ SOLN: 4.0000 mg | Freq: Once | INTRAMUSCULAR | Status: DC | PRN

## 2014-04-22 MED ORDER — ROCURONIUM BROMIDE 100 MG/10ML IV SOLN
INTRAVENOUS | Status: DC | PRN
  Administered 2014-04-22: 40 mg via INTRAVENOUS

## 2014-04-22 MED ORDER — SODIUM CHLORIDE 0.9 % IR SOLN
Status: DC | PRN
  Administered 2014-04-22: 1000 mL

## 2014-04-22 MED ORDER — ROCURONIUM BROMIDE 50 MG/5ML IV SOLN
INTRAVENOUS | Status: AC
  Filled 2014-04-22: qty 1

## 2014-04-22 SURGICAL SUPPLY — 34 items
BLADE SURG 15 STRL LF DISP TIS (BLADE) ×1 IMPLANT
BLADE SURG 15 STRL SS (BLADE) ×3
BUR CROSS CUT FISSURE 1.6 (BURR) ×2 IMPLANT
BUR CROSS CUT FISSURE 1.6MM (BURR) ×1
BUR EGG ELITE 4.0 (BURR) ×1 IMPLANT
BUR EGG ELITE 4.0MM (BURR) ×1
CANISTER SUCTION 2500CC (MISCELLANEOUS) ×3 IMPLANT
COVER SURGICAL LIGHT HANDLE (MISCELLANEOUS) ×3 IMPLANT
CRADLE DONUT ADULT HEAD (MISCELLANEOUS) ×3 IMPLANT
DECANTER SPIKE VIAL GLASS SM (MISCELLANEOUS) ×3 IMPLANT
GAUZE PACKING FOLDED 2  STR (GAUZE/BANDAGES/DRESSINGS) ×2
GAUZE PACKING FOLDED 2 STR (GAUZE/BANDAGES/DRESSINGS) ×1 IMPLANT
GLOVE BIO SURGEON STRL SZ 6.5 (GLOVE) ×2 IMPLANT
GLOVE BIO SURGEON STRL SZ7.5 (GLOVE) ×3 IMPLANT
GLOVE BIO SURGEONS STRL SZ 6.5 (GLOVE) ×1
GLOVE BIOGEL PI IND STRL 7.0 (GLOVE) ×1 IMPLANT
GLOVE BIOGEL PI INDICATOR 7.0 (GLOVE) ×4
GLOVE SURG SS PI 7.0 STRL IVOR (GLOVE) ×2 IMPLANT
GOWN STRL REUS W/ TWL LRG LVL3 (GOWN DISPOSABLE) ×1 IMPLANT
GOWN STRL REUS W/ TWL XL LVL3 (GOWN DISPOSABLE) ×1 IMPLANT
GOWN STRL REUS W/TWL LRG LVL3 (GOWN DISPOSABLE) ×3
GOWN STRL REUS W/TWL XL LVL3 (GOWN DISPOSABLE) ×3
KIT BASIN OR (CUSTOM PROCEDURE TRAY) ×3 IMPLANT
KIT ROOM TURNOVER OR (KITS) ×3 IMPLANT
NEEDLE 22X1 1/2 (OR ONLY) (NEEDLE) ×6 IMPLANT
NS IRRIG 1000ML POUR BTL (IV SOLUTION) ×3 IMPLANT
PAD ARMBOARD 7.5X6 YLW CONV (MISCELLANEOUS) ×3 IMPLANT
SCRUB BETADINE 4OZ XXX (MISCELLANEOUS) IMPLANT
SUT CHROMIC 3 0 PS 2 (SUTURE) ×6 IMPLANT
SYRINGE 10CC LL (SYRINGE) ×3 IMPLANT
TOWEL OR 17X26 10 PK STRL BLUE (TOWEL DISPOSABLE) ×3 IMPLANT
TRAY ENT MC OR (CUSTOM PROCEDURE TRAY) ×3 IMPLANT
TUBING IRRIGATION (MISCELLANEOUS) ×3 IMPLANT
YANKAUER SUCT BULB TIP NO VENT (SUCTIONS) ×3 IMPLANT

## 2014-04-22 NOTE — Anesthesia Preprocedure Evaluation (Addendum)
Anesthesia Evaluation  Patient identified by MRN, date of birth, ID band Patient awake    Reviewed: Allergy & Precautions, NPO status , Patient's Chart, lab work & pertinent test results  History of Anesthesia Complications Negative for: history of anesthetic complications  Airway Mallampati: II  TM Distance: >3 FB Neck ROM: Full    Dental  (+) Poor Dentition, Dental Advisory Given   Pulmonary Current Smoker,  breath sounds clear to auscultation        Cardiovascular hypertension, Rhythm:Regular Rate:Normal     Neuro/Psych PSYCHIATRIC DISORDERS Anxiety Depression Multiple sclerosis  Neuromuscular disease    GI/Hepatic Neg liver ROS, GERD-  Medicated,  Endo/Other    Renal/GU negative Renal ROS     Musculoskeletal  (+) Fibromyalgia -, narcotic dependent  Abdominal   Peds  Hematology   Anesthesia Other Findings   Reproductive/Obstetrics negative OB ROS                            Anesthesia Physical Anesthesia Plan  ASA: III  Anesthesia Plan: General   Post-op Pain Management:    Induction: Intravenous  Airway Management Planned: Nasal ETT  Additional Equipment:   Intra-op Plan:   Post-operative Plan:   Informed Consent: I have reviewed the patients History and Physical, chart, labs and discussed the procedure including the risks, benefits and alternatives for the proposed anesthesia with the patient or authorized representative who has indicated his/her understanding and acceptance.   Dental advisory given  Plan Discussed with: CRNA and Anesthesiologist  Anesthesia Plan Comments:         Anesthesia Quick Evaluation

## 2014-04-22 NOTE — H&P (Signed)
HISTORY AND PHYSICAL  Brent [name] is a 44 y.o. male patient with CC:  No diagnosis found.  Past Medical History  Diagnosis Date  . Neuromuscular disorder     multiple sclerosis  . Hypertension     was on medication for short time, htn was caused by prednisone per pt  . Pancreatitis   . Chronic back pain   . Fibromyalgia   . GERD (gastroesophageal reflux disease)   . Headache   . Arthritis   . Anxiety     severe panic attacks  . Depression   . Retinal vasculitis   . Uveitis   . Multiple sclerosis 04/15/14    Current Facility-Administered Medications  Medication Dose Route Frequency Provider Last Rate Last Dose  . ceFAZolin (ANCEF) IVPB 2 g/50 mL premix  2 g Intravenous On Call to OR Ocie Doyne, DDS      . lactated ringers infusion   Intravenous Continuous Kipp Brood, MD 50 mL/hr at 04/22/14 0731     No Known Allergies Active Problems:   * No active hospital problems. *  Vitals: Blood pressure 104/81, pulse 92, temperature 98.3 F (36.8 C), temperature source Oral, resp. rate 20, height  (1.676 m), weight 50.122 kg (110 lb 8 oz), SpO2 100 %. Lab results:No results found for this or any previous visit (from the past 24 hour(s)). Radiology Results: No results found. No change from previous exam.  Assessment: Nonrestorable teeth  Plan:Dental extractions   Wade Sigala M 04/22/2014

## 2014-04-22 NOTE — Transfer of Care (Signed)
Immediate Anesthesia Transfer of Care Note  Patient: Brent [name]  Procedure(s) Performed: Procedure(s): MULTIPLE EXTRACTIONS ( 2,3,5,6,7,8,9,10,11,13,14,15,21,28  WITH ALVEOLOPLASTY (N/A)  Patient Location: PACU  Anesthesia Type:General  Level of Consciousness: awake, oriented and patient cooperative  Airway & Oxygen Therapy: Patient Spontanous Breathing and Patient connected to nasal cannula oxygen  Post-op Assessment: Report given to RN and Post -op Vital signs reviewed and stable  Post vital signs: Reviewed  Last Vitals:  Filed Vitals:   04/22/14 0720  BP: 104/81  Pulse: 92  Temp: 36.8 C  Resp: 20    Complications: No apparent anesthesia complications

## 2014-04-22 NOTE — Anesthesia Postprocedure Evaluation (Signed)
  Anesthesia Post-op Note  Patient: Brent [name]  Procedure(s) Performed: Procedure(s): MULTIPLE EXTRACTIONS ( 2,3,5,6,7,8,9,10,11,13,14,15,21,28  WITH ALVEOLOPLASTY (N/A)  Patient Location: PACU  Anesthesia Type:General  Level of Consciousness: awake, alert  and oriented  Airway and Oxygen Therapy: Patient Spontanous Breathing and Patient connected to nasal cannula oxygen  Post-op Pain: mild  Post-op Assessment: Post-op Vital signs reviewed, Patient's Cardiovascular Status Stable, Respiratory Function Stable, Patent Airway and Pain level controlled  Post-op Vital Signs: stable  Last Vitals:  Filed Vitals:   04/22/14 1058  BP: 100/64  Pulse: 87  Temp:   Resp: 15    Complications: No apparent anesthesia complications

## 2014-04-22 NOTE — Discharge Instructions (Signed)
What to Eat after Tooth extraction:   ° ° °For your first meals, you should eat lightly; only small meals at first.   Avoid Sharp, Crunchy, and Hot foods.   If you do not have nausea, you may eat larger meals.  Avoid spicy, greasy and heavy food, as these may make you sick after the anesthesia.  °. ° °Sinus precautions after surgery:  °1. Avoid blowing your nose.  °2. Avoid sneezing. If you might sneeze, Keep her mouth open and do not pinch your nose closed.  °3. Avoid sucking on straws. Avoid smoking.  °4. Do not lift objects weighing more than 20 pounds  °Call if questions arise.  ° °MOUTH CARE AFTER SURGERY  °FACTS:  °Ice used in ice bag helps keep the swelling down, and can help lessen the pain.  °It is easier to treat pain BEFORE it happens.  °Spitting disturbs the clot and may cause bleeding to start again, or to get worse.  °Smoking delays healing and can cause complications.  °Sharing prescriptions can be dangerous. Do not take medications not recently prescribed for you.  °Antibiotics may stop birth control pills from working. Use other means of birth control while on antibiotics.  °Warm salt water rinses after the first 24 hours will help lessen the swelling: Use 1/2 teaspoonful of table salt per oz.of water. °DO NOT:  °Do not spit. Do not drink through a straw.  °Strongly advised not to smoke, dip snuff or chew tobacco at least for 3 days.  °Do not eat sharp or crunchy foods. Avoid the area of surgery when chewing.  °Do not stop your antibiotics before your instructions say to do so.  °Do not eat hot foods until bleeding has stopped. If you need to, let your food cool down to room temperature. °EXPECT:  °Some swelling, especially first 2-3 days.  °Soreness or discomfort in varying degrees. Follow your dentist's instructions about how to handle pain before it starts.  °Pinkish saliva or light blood in saliva, or on your pillow in the morning. This can last around 24 hours.  °Bruising inside or outside the  mouth. This may not show up until 2-3 days after surgery. Don't worry, it will go away in time.  °Pieces of "bone" may work themselves loose. It's OK. If they bother you, let us know. °WHAT TO DO IMMEDIATELY AFTER SURGERY:  °Bite on the gauze with steady pressure for 1-2 hours. Don't chew on the gauze.  °Do not lie down flat. Raise your head support especially for the first 24 hours.  °Apply ice to your face on the side of the surgery. You may apply it 20 minutes on and a few minutes off. Ice for 8-12 hours. You may use ice up to 24 hours.  °Before the numbness wears off, take a pain pill as instructed.  °Prescription pain medication is not always required. °SWELLING:  °Expect swelling for the first couple of days. It should get better after that.  °If swelling increases 3 days or so after surgery; let us know as soon as possible. °FEVER:  °Take Tylenol every 4 hours if needed to lower your temperature, especially if it is at 100F or higher.  °Drink lots of fluids.  °If the fever does not go away, let us know. °BREATHING TROUBLE:  °Any unusual difficulty breathing means you have to have someone bring you to the emergency room ASAP °BLEEDING:  °Light oozing is expected for 24 hours or so.  °Prop head up   with pillows  °Avoid spitting  °Do not confuse bright red fresh flowing blood with lots of saliva colored with a little bit of blood.  °If you notice some bleeding, place gauze or a tea bag where it is bleeding and apply CONSTANT pressure by biting down for 1 hour. Avoid talking during this time. Do not remove the gauze or tea bag during this hour to "check" the bleeding.  °If you notice bright RED bleeding FLOWING out of particular area, and filling the floor of your mouth, put a wad of gauze on that area, bite down firmly and constantly. Call us immediately. If we're closed, have someone bring you to the emergency room. °ORAL HYGIENE:  °Brush your teeth as usual after meals and before bedtime.  °Use a soft  toothbrush around the area of surgery.  °DO NOT AVOID BRUSHING. Otherwise bacteria(germs) will grow and may delay healing or encourage infection.  °Since you cannot spit, just gently rinse and let the water flow out of your mouth.  °DO NOT SWISH HARD. °EATING:  °Cool liquids are a good point to start. Increase to soft foods as tolerated. °PRESCRIPTIONS:  °Follow the directions for your prescriptions exactly as written.  °If your doctor gave you a narcotic pain medication, do not drive, operate machinery or drink alcohol when on that medication. °QUESTIONS:  °Call our office during office hours ° ° °

## 2014-04-22 NOTE — Anesthesia Procedure Notes (Signed)
Procedure Name: Intubation Date/Time: 04/22/2014 9:05 AM Performed by: Romie Minus K Pre-anesthesia Checklist: Patient identified, Emergency Drugs available, Suction available, Patient being monitored and Timeout performed Patient Re-evaluated:Patient Re-evaluated prior to inductionOxygen Delivery Method: Circle system utilized Preoxygenation: Pre-oxygenation with 100% oxygen Intubation Type: IV induction Ventilation: Mask ventilation without difficulty Laryngoscope Size: Mac and 3 Grade View: Grade I Nasal Tubes: Left, Magill forceps- large, utilized, Nasal prep performed and Nasal Rae Tube size: 7.5 mm Number of attempts: 1 Placement Confirmation: ETT inserted through vocal cords under direct vision,  positive ETCO2 and breath sounds checked- equal and bilateral Secured at: 29 cm Tube secured with: Tape Dental Injury: Teeth and Oropharynx as per pre-operative assessment

## 2014-04-22 NOTE — Op Note (Signed)
04/22/2014  9:40 AM  PATIENT:  Brent [name]  44 y.o. male  PRE-OPERATIVE DIAGNOSIS:  NON RESTORABLE TEETH 2,3,5,6,7,8,9,10,11,13,14,15,21,28  POST-OPERATIVE DIAGNOSIS:  SAME + retained root tooth #12  PROCEDURE:  Procedure(s): MULTIPLE EXTRACTIONS  2,3,5,6,7,8,9,10,11,12, 13,14,15,21,28  WITH ALVEOLOPLASTY  SURGEON:  Surgeon(s): Ocie Doyne, DDS  ANESTHESIA:   local and general  EBL:  minimal  DRAINS: none   SPECIMEN:  No Specimen  COUNTS:  YES  PLAN OF CARE: Discharge to home after PACU  PATIENT DISPOSITION:  PACU - hemodynamically stable.   PROCEDURE DETAILS: Dictation # [id number]  Georgia Lopes, DMD 04/22/2014 9:40 AM

## 2014-04-23 NOTE — Op Note (Signed)
NAME:  Brent [name], Brent [name]                ACCOUNT NO.:  [id number]  MEDICAL RECORD NO.:  [id number]  LOCATION:  [facility]                         FACILITY:  [facility]  PHYSICIAN:  Georgia Lopes, M.D.  DATE OF BIRTH:  November 05, 1970  DATE OF PROCEDURE:  04/22/2014 DATE OF DISCHARGE:  04/22/2014                              OPERATIVE REPORT   PREOPERATIVE DIAGNOSIS:  Nonrestorable teeth numbers 2, 3, 5, 6, 7, 8, 9, 10, 11, 13, 14, 15, 21, 28.  POSTOPERATIVE DIAGNOSIS:  Nonrestorable teeth numbers 2, 3, 5, 6, 7, 8, 9, 10, 11, 13, 14, 15, 21, 28, plus retained root tooth #12.  PROCEDURE:  Multiple extractions, 2, 3, 5, 6, 7, 8, 9, 10, 11, 12, 13, 14, 15, 21, 28, and alveoplasty of right and left maxilla.  SURGEON:  Georgia Lopes, M.D.  ANESTHESIA:  General, nasal, Dr. Noreene Larsson, attending.  DESCRIPTION OF PROCEDURE:  The patient was taken to the operating room, placed on the table in supine position.  General anesthesia was administered intravenously and a nasal endotracheal tube was placed and secured.  The eyes were protected and the patient was draped for the procedure.  Time-out was performed.  The posterior pharynx was suctioned and the throat pack was placed.  There was a small laceration to the left soft palate that did not require suturing that had apparently been done by intubation attempt.  Then, 2% lidocaine with 1:100,000 epinephrine was infiltrated in an inferior alveolar block on the right and left side and a buccal and palatal infiltration in the maxilla, total of 16 mL was utilized.  A bite block was placed on the right side of the mouth and a Sweetheart retractor was used to retract the tongue. A #15 blade was used to make an incision around tooth #21 and the mandible and around teeth numbers 15, 14, 13, 11, 10, 9, 8, 7, and 6 in the maxilla on the buccal and palatal aspects.  The periosteum was reflected in the maxilla and mandible on the left side.  With a periosteal elevator, the  teeth were elevated with a 301 elevator and then the teeth were extracted with the dental forceps.  The sockets were then curetted and the periosteum in the maxilla was further reflected with the periosteal elevator to expose the alveolar crest.  Bone was trimmed using rongeurs, egg-shaped burr, and bone file.  After trimming, it was noted that there was a retained root in the area of tooth #12. This was removed using the rongeur.  Then, the area was sutured with 3-0 chromic.  The Sweetheart retractor and bite block were repositioned to the other side of the mouth and a 15 blade was used to make an incision around teeth numbers  28, 2, 3, and 5 in the maxilla.  The periosteum was reflected with a periosteal elevator.  The teeth were elevated with a 301 elevator and removed from the mouth with a dental forceps.  Then, the sockets were curetted and the periosteum was reflected in the maxilla and alveoplasty was performed on the right side using the egg- shaped burr and bone file.  The area was then irrigated and closed with 3-0  chromic.  The oral cavity was then irrigated and suctioned.  A throat pack was removed.  The patient was awakened, taken to the recovery room, breathing spontaneously in good condition.  ESTIMATED BLOOD LOSS:  Minimal.  COMPLICATIONS:  None.  SPECIMENS:  None.     Georgia Lopes, M.D.     SMJ/MEDQ  D:  04/22/2014  T:  04/23/2014  Job:  [id number]

## 2014-04-24 ENCOUNTER — Encounter (HOSPITAL_COMMUNITY): Payer: Self-pay | Admitting: Oral Surgery

## 2014-05-01 ENCOUNTER — Inpatient Hospital Stay (HOSPITAL_COMMUNITY): Payer: Medicare Other

## 2014-05-01 ENCOUNTER — Encounter (HOSPITAL_COMMUNITY)
Admission: EM | Disposition: A | Discharge status: Home / Self Care | Payer: Self-pay | Source: Home / Self Care | Attending: Family Medicine

## 2014-05-01 ENCOUNTER — Encounter (HOSPITAL_COMMUNITY): Payer: Self-pay

## 2014-05-01 ENCOUNTER — Emergency Department (HOSPITAL_COMMUNITY): Payer: Medicare Other

## 2014-05-01 ENCOUNTER — Inpatient Hospital Stay (HOSPITAL_COMMUNITY)
Admission: EM | Admit: 2014-05-01 | Discharge: 2014-05-03 | DRG: 872 | Disposition: A | Discharge status: Home / Self Care | Payer: Medicare Other | Attending: Internal Medicine | Admitting: Internal Medicine

## 2014-05-01 ENCOUNTER — Inpatient Hospital Stay (HOSPITAL_COMMUNITY): Payer: Medicare Other | Admitting: Anesthesiology

## 2014-05-01 DIAGNOSIS — Z720 Tobacco use: Secondary | ICD-10-CM | POA: Diagnosis present

## 2014-05-01 DIAGNOSIS — F17210 Nicotine dependence, cigarettes, uncomplicated: Secondary | ICD-10-CM | POA: Diagnosis present

## 2014-05-01 DIAGNOSIS — E876 Hypokalemia: Secondary | ICD-10-CM | POA: Diagnosis present

## 2014-05-01 DIAGNOSIS — N1330 Unspecified hydronephrosis: Secondary | ICD-10-CM

## 2014-05-01 DIAGNOSIS — A419 Sepsis, unspecified organism: Secondary | ICD-10-CM | POA: Diagnosis present

## 2014-05-01 DIAGNOSIS — G35 Multiple sclerosis: Secondary | ICD-10-CM | POA: Diagnosis present

## 2014-05-01 DIAGNOSIS — R636 Underweight: Secondary | ICD-10-CM | POA: Diagnosis present

## 2014-05-01 DIAGNOSIS — B9620 Unspecified Escherichia coli [E. coli] as the cause of diseases classified elsewhere: Secondary | ICD-10-CM | POA: Diagnosis present

## 2014-05-01 DIAGNOSIS — M1990 Unspecified osteoarthritis, unspecified site: Secondary | ICD-10-CM | POA: Diagnosis present

## 2014-05-01 DIAGNOSIS — K219 Gastro-esophageal reflux disease without esophagitis: Secondary | ICD-10-CM | POA: Diagnosis present

## 2014-05-01 DIAGNOSIS — F419 Anxiety disorder, unspecified: Secondary | ICD-10-CM | POA: Diagnosis present

## 2014-05-01 DIAGNOSIS — R109 Unspecified abdominal pain: Secondary | ICD-10-CM

## 2014-05-01 DIAGNOSIS — E119 Type 2 diabetes mellitus without complications: Secondary | ICD-10-CM | POA: Diagnosis present

## 2014-05-01 DIAGNOSIS — R319 Hematuria, unspecified: Secondary | ICD-10-CM

## 2014-05-01 DIAGNOSIS — F410 Panic disorder [episodic paroxysmal anxiety] without agoraphobia: Secondary | ICD-10-CM | POA: Diagnosis present

## 2014-05-01 DIAGNOSIS — N201 Calculus of ureter: Secondary | ICD-10-CM

## 2014-05-01 DIAGNOSIS — N132 Hydronephrosis with renal and ureteral calculous obstruction: Secondary | ICD-10-CM | POA: Diagnosis present

## 2014-05-01 DIAGNOSIS — N200 Calculus of kidney: Secondary | ICD-10-CM

## 2014-05-01 DIAGNOSIS — Z801 Family history of malignant neoplasm of trachea, bronchus and lung: Secondary | ICD-10-CM | POA: Diagnosis not present

## 2014-05-01 DIAGNOSIS — N390 Urinary tract infection, site not specified: Secondary | ICD-10-CM | POA: Diagnosis not present

## 2014-05-01 DIAGNOSIS — R1032 Left lower quadrant pain: Secondary | ICD-10-CM | POA: Diagnosis not present

## 2014-05-01 DIAGNOSIS — R509 Fever, unspecified: Secondary | ICD-10-CM | POA: Diagnosis present

## 2014-05-01 HISTORY — PX: CYSTOSCOPY W/ URETERAL STENT PLACEMENT: SHX1429

## 2014-05-01 LAB — URINALYSIS, ROUTINE W REFLEX MICROSCOPIC
Glucose, UA: NEGATIVE mg/dL
KETONES UR: NEGATIVE mg/dL
Nitrite: POSITIVE — AB
Protein, ur: 100 mg/dL — AB
Urobilinogen, UA: 1 mg/dL (ref 0.0–1.0)
pH: 5.5 (ref 5.0–8.0)

## 2014-05-01 LAB — CBC WITH DIFFERENTIAL/PLATELET
BASOS PCT: 0 % (ref 0–1)
Basophils Absolute: 0 10*3/uL (ref 0.0–0.1)
EOS PCT: 1 % (ref 0–5)
Eosinophils Absolute: 0.1 10*3/uL (ref 0.0–0.7)
HEMATOCRIT: 40.3 % (ref 39.0–52.0)
Hemoglobin: 13.6 g/dL (ref 13.0–17.0)
Lymphocytes Relative: 19 % (ref 12–46)
Lymphs Abs: 2 10*3/uL (ref 0.7–4.0)
MCH: 32.5 pg (ref 26.0–34.0)
MCHC: 33.7 g/dL (ref 30.0–36.0)
MCV: 96.2 fL (ref 78.0–100.0)
MONO ABS: 1.2 10*3/uL — AB (ref 0.1–1.0)
Monocytes Relative: 11 % (ref 3–12)
Neutro Abs: 7.2 10*3/uL (ref 1.7–7.7)
Neutrophils Relative %: 69 % (ref 43–77)
Platelets: 413 10*3/uL — ABNORMAL HIGH (ref 150–400)
RBC: 4.19 MIL/uL — ABNORMAL LOW (ref 4.22–5.81)
RDW: 14 % (ref 11.5–15.5)
WBC: 10.5 10*3/uL (ref 4.0–10.5)

## 2014-05-01 LAB — BASIC METABOLIC PANEL
Anion gap: 9 (ref 5–15)
BUN: 13 mg/dL (ref 6–23)
CALCIUM: 8.4 mg/dL (ref 8.4–10.5)
CO2: 24 mmol/L (ref 19–32)
CREATININE: 0.78 mg/dL (ref 0.50–1.35)
Chloride: 100 mmol/L (ref 96–112)
GFR calc non Af Amer: >90 mL/min (ref >90)
Glucose, Bld: 79 mg/dL (ref 70–99)
Potassium: 3.4 mmol/L — ABNORMAL LOW (ref 3.5–5.1)
Sodium: 133 mmol/L — ABNORMAL LOW (ref 135–145)

## 2014-05-01 LAB — URINE MICROSCOPIC-ADD ON

## 2014-05-01 SURGERY — CYSTOSCOPY, WITH RETROGRADE PYELOGRAM AND URETERAL STENT INSERTION
Anesthesia: General | Laterality: Bilateral

## 2014-05-01 MED ORDER — PROPOFOL 10 MG/ML IV BOLUS
INTRAVENOUS | Status: DC | PRN
  Administered 2014-05-01: 150 mg via INTRAVENOUS
  Administered 2014-05-01: 50 mg via INTRAVENOUS

## 2014-05-01 MED ORDER — LIDOCAINE HCL 2 % EX GEL
CUTANEOUS | Status: AC
  Filled 2014-05-01: qty 10

## 2014-05-01 MED ORDER — SUCCINYLCHOLINE CHLORIDE 20 MG/ML IJ SOLN
INTRAMUSCULAR | Status: DC | PRN
  Administered 2014-05-01: 100 mg via INTRAVENOUS

## 2014-05-01 MED ORDER — MIDAZOLAM HCL 2 MG/2ML IJ SOLN
INTRAMUSCULAR | Status: AC
  Filled 2014-05-01: qty 2

## 2014-05-01 MED ORDER — IOHEXOL 350 MG/ML SOLN
INTRAVENOUS | Status: DC | PRN
  Administered 2014-05-01: 10 mL

## 2014-05-01 MED ORDER — GLYCOPYRROLATE 0.2 MG/ML IJ SOLN
INTRAMUSCULAR | Status: AC
  Filled 2014-05-01: qty 3

## 2014-05-01 MED ORDER — METHYLPREDNISOLONE SODIUM SUCC 125 MG IJ SOLR
INTRAMUSCULAR | Status: DC | PRN
  Administered 2014-05-01: 125 mg via INTRAVENOUS

## 2014-05-01 MED ORDER — KETOROLAC TROMETHAMINE 30 MG/ML IJ SOLN: 30.0000 mg | Freq: Once | INTRAMUSCULAR | Status: DC

## 2014-05-01 MED ORDER — NEOSTIGMINE METHYLSULFATE 10 MG/10ML IV SOLN
INTRAVENOUS | Status: AC
  Filled 2014-05-01: qty 1

## 2014-05-01 MED ORDER — SODIUM CHLORIDE 0.9 % IR SOLN
Status: DC | PRN
  Administered 2014-05-01: 3000 mL

## 2014-05-01 MED ORDER — MORPHINE SULFATE 2 MG/ML IJ SOLN
1.0000 mg | INTRAMUSCULAR | Status: DC | PRN
  Administered 2014-05-02: 1 mg via INTRAVENOUS
  Filled 2014-05-01: qty 1

## 2014-05-01 MED ORDER — SODIUM CHLORIDE 0.9 % IV SOLN
INTRAVENOUS | Status: DC | PRN
  Administered 2014-05-01: 23:00:00 via INTRAVENOUS

## 2014-05-01 MED ORDER — PROPOFOL 10 MG/ML IV BOLUS
INTRAVENOUS | Status: AC
  Filled 2014-05-01: qty 20

## 2014-05-01 MED ORDER — POTASSIUM CHLORIDE CRYS ER 20 MEQ PO TBCR
20.0000 meq | EXTENDED_RELEASE_TABLET | Freq: Once | ORAL | Status: AC
  Administered 2014-05-02: 20 meq via ORAL
  Filled 2014-05-01: qty 1

## 2014-05-01 MED ORDER — NEOSTIGMINE METHYLSULFATE 10 MG/10ML IV SOLN
INTRAVENOUS | Status: DC | PRN
  Administered 2014-05-01: 3 mg via INTRAVENOUS

## 2014-05-01 MED ORDER — DEXTROSE 5 % IV SOLN
1.0000 g | Freq: Once | INTRAVENOUS | Status: AC
  Administered 2014-05-01: 1 g via INTRAVENOUS
  Filled 2014-05-01: qty 10

## 2014-05-01 MED ORDER — ROCURONIUM BROMIDE 100 MG/10ML IV SOLN
INTRAVENOUS | Status: DC | PRN
  Administered 2014-05-01: 15 mg via INTRAVENOUS
  Administered 2014-05-01: 5 mg via INTRAVENOUS

## 2014-05-01 MED ORDER — FENTANYL CITRATE 0.05 MG/ML IJ SOLN
INTRAMUSCULAR | Status: DC | PRN
  Administered 2014-05-01: 50 ug via INTRAVENOUS
  Administered 2014-05-01: 100 ug via INTRAVENOUS
  Administered 2014-05-01: 50 ug via INTRAVENOUS

## 2014-05-01 MED ORDER — GLYCOPYRROLATE 0.2 MG/ML IJ SOLN
INTRAMUSCULAR | Status: DC | PRN
  Administered 2014-05-01: 0.6 mg via INTRAVENOUS

## 2014-05-01 MED ORDER — SODIUM CHLORIDE 0.9 % IV SOLN
Freq: Once | INTRAVENOUS | Status: AC
  Administered 2014-05-01: 20:00:00 via INTRAVENOUS

## 2014-05-01 MED ORDER — MIDAZOLAM HCL 2 MG/2ML IJ SOLN
INTRAMUSCULAR | Status: DC | PRN
  Administered 2014-05-01 (×2): 1 mg via INTRAVENOUS
  Administered 2014-05-01: 2 mg via INTRAVENOUS

## 2014-05-01 MED ORDER — ROCURONIUM BROMIDE 50 MG/5ML IV SOLN
INTRAVENOUS | Status: AC
  Filled 2014-05-01: qty 1

## 2014-05-01 MED ORDER — METHYLPREDNISOLONE SODIUM SUCC 125 MG IJ SOLR
INTRAMUSCULAR | Status: AC
  Filled 2014-05-01: qty 2

## 2014-05-01 MED ORDER — LIDOCAINE HCL (PF) 1 % IJ SOLN
INTRAMUSCULAR | Status: AC
  Filled 2014-05-01: qty 5

## 2014-05-01 MED ORDER — ONDANSETRON HCL 4 MG/2ML IJ SOLN
INTRAMUSCULAR | Status: AC
  Filled 2014-05-01: qty 2

## 2014-05-01 MED ORDER — ONDANSETRON HCL 4 MG/2ML IJ SOLN
INTRAMUSCULAR | Status: DC | PRN
  Administered 2014-05-01: 4 mg via INTRAVENOUS

## 2014-05-01 MED ORDER — LIDOCAINE HCL (CARDIAC) 10 MG/ML IV SOLN
INTRAVENOUS | Status: DC | PRN
  Administered 2014-05-01: 30 mg via INTRAVENOUS

## 2014-05-01 MED ORDER — FENTANYL CITRATE 0.05 MG/ML IJ SOLN
INTRAMUSCULAR | Status: AC
  Filled 2014-05-01: qty 2

## 2014-05-01 MED ORDER — SUCCINYLCHOLINE CHLORIDE 20 MG/ML IJ SOLN
INTRAMUSCULAR | Status: AC
  Filled 2014-05-01: qty 1

## 2014-05-01 SURGICAL SUPPLY — 18 items
BAG DRAIN CYSTO-URO STER (DRAIN) ×2 IMPLANT
CATH INTERMIT  6FR 70CM (CATHETERS) ×2 IMPLANT
CATH URET 5FR 28IN CONE TIP (BALLOONS) ×2
CATH URET 5FR 70CM CONE TIP (BALLOONS) IMPLANT
GLOVE BIO SURGEON STRL SZ7.5 (GLOVE) ×4 IMPLANT
GLOVE BIOGEL M 7.0 STRL (GLOVE) ×2 IMPLANT
GLOVE BIOGEL PI IND STRL 7.0 (GLOVE) IMPLANT
GLOVE BIOGEL PI INDICATOR 7.0 (GLOVE) ×6
GOWN STRL REUS W/ TWL LRG LVL4 (GOWN DISPOSABLE) IMPLANT
GOWN STRL REUS W/TWL LRG LVL4 (GOWN DISPOSABLE) ×3
GOWN STRL REUS W/TWL XL LVL3 (GOWN DISPOSABLE) ×2 IMPLANT
GUIDEWIRE STR DUAL SENSOR (WIRE) ×2 IMPLANT
IV NS IRRIG 3000ML ARTHROMATIC (IV SOLUTION) ×2 IMPLANT
KIT ROOM TURNOVER APOR (KITS) ×2 IMPLANT
PACK CYSTO (CUSTOM PROCEDURE TRAY) ×2 IMPLANT
STENT URET 6FRX24 CONTOUR (STENTS) ×4 IMPLANT
TRAY FOLEY CATH 16FR SILVER (SET/KITS/TRAYS/PACK) ×2 IMPLANT
WATER STERILE IRR 1000ML POUR (IV SOLUTION) ×2 IMPLANT

## 2014-05-01 NOTE — H&P (Addendum)
Patient's PCP: Milana Obey, MD  Chief Complaint: Abdominal pain  History of Present Illness: Brent [name] is a 44 y.o. Caucasian male with history of multiple sclerosis, alcoholic pancreatitis, history of eye inflammation has been on chronic high-dose steroids in the past, depression, anxiety, and status post multiple teeth extraction on 04/22/2014 has been on amoxicillin who presents with the above complaints.  Patient reports that his symptoms started about 3 days ago.  He has been having generalized abdominal pain with difficulty urinating and pain with urinating.  He reports that at times he has had dribbling when he has been urinating.  He has been having left-sided abdominal pain.  At home he has been having a temperature 103 with chills and sweats.  He presented to the emergency department today on CT was found to have obstructing 5 mm left UVJ calculus with moderate left hydroureteronephrosis and multiple nonobstructing bilateral urethral calculi and nonobstructing bilateral renal calculi with remote L2 superior endplate compression fracture.  ED physician discussed with Dr. Isabel Caprice, urology, who is planning on taking the patient to the OR tonight.  Denies any chest pain, shortness of breath, diarrhea, headaches or vision changes.  Does complain of back pain particularly on the left side.  Review of Systems: All systems reviewed with the patient and positive as per history of present illness, otherwise all other systems are negative.  Past Medical History  Diagnosis Date  . Condylomata acuminata in male     multiple procedures  . GERD (gastroesophageal reflux disease)   . Chronic back pain   . Pancreatitis     H/O  . Suicidal ideation     Admission in 2006 for suicidal ideation without attempt  . Anxiety     Disabled due to panic attacks  . Pancreatitis, alcoholic     admission 08/2011  . Multiple sclerosis     pt. states has 4 small brain lesions  . Shortness of breath      pt. states with exertion & at night  . Diabetes mellitus without complication     borederline  . Headache(784.0)   . Arthritis   . Depression   . Eye inflammation    Past Surgical History  Procedure Laterality Date  . Egd with ed by dr. Karilyn Cota  6/06    erosive RE with small sliding hh. HP neg, 62F  . Tcs by dr. Karilyn Cota  12/05    External hemorrhoids  . Electrocautery/dessication of condyloma of penis and perianal area  2009/2011    Dr. Leticia Penna  . Colonoscopy  10/08/2011    Jenkins:Normal colon/Anal condyloma without extension proximal to dentate line  . Flexible bronchoscopy N/A 08/12/2012    Procedure: FLEXIBLE BRONCHOSCOPY;  Surgeon: Fredirick Maudlin, MD;  Location: [facility];  Service: Pulmonary;  Laterality: N/A;  . 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 Abnormal gastric mucosa-status post biopsy  . Maloney dilation N/A 11/12/2012    Procedure: MALONEY DILATION (54mm);  Surgeon: Corbin Ade, MD;  Location: [facility];  Service: Endoscopy;  Laterality: N/A;  . Esophageal biopsy  11/12/2012    Procedure: GASTRIC AND ESOPHAGEAL BIOPSIES;  Surgeon: Corbin Ade, MD;  Location: [facility];  Service: Endoscopy;;   Family History  Problem Relation Age of Onset  . Lung cancer Father 95  . Colon cancer Neg Hx   . Colitis Neg Hx   . Cirrhosis Neg Hx   .  Liver disease Neg Hx   . Pancreatic cancer Neg Hx   . Pancreatitis Neg Hx   . Anesthesia problems Neg Hx   . Hypotension Neg Hx   . Malignant hyperthermia Neg Hx   . Pseudochol deficiency Neg Hx    History   Social History  . Marital Status: Single    Spouse Name: N/A  . Number of Children: 0  . Years of Education: N/A   Occupational History  . disabled     anxiety   Social History Main Topics  . Smoking status: Current Every Day Smoker -- 1.00 packs/day for 20 years    Types: Cigarettes  . Smokeless tobacco: Not on file     Comment: 15  to 20 cigarrettes daily  . Alcohol Use: No     Comment:  none since 08/2011  . Drug Use: No  . Sexual Activity: Not on file   Other Topics Concern  . Not on file   Social History Narrative   Allergies: Review of patient's allergies indicates no known allergies.  Home Meds: Prior to Admission medications   Medication Sig Start Date End Date Taking? Authorizing Provider  ALPRAZolam Prudy Feeler) 0.5 MG tablet Take 1 mg by mouth 4 (four) times daily. For anxiety 05/03/10  Yes Historical Provider, MD  Aspirin-Salicylamide-Caffeine (BC HEADACHE POWDER PO) Take 1 packet by mouth daily as needed (pain).    Yes Historical Provider, MD  calcium carbonate (OS-CAL) 600 MG TABS tablet Take 600 mg by mouth daily with breakfast.   Yes Historical Provider, MD  Cholecalciferol (VITAMIN D3) 5000 UNITS CAPS Take 1 capsule by mouth daily.   Yes Historical Provider, MD  Cyanocobalamin (B-12 PO) Take 1 tablet by mouth daily.   Yes Historical Provider, MD  fluticasone (FLONASE) 50 MCG/ACT nasal spray Place 1 spray into both nostrils daily.   Yes Historical Provider, MD  HYDROcodone-acetaminophen (NORCO) 10-325 MG per tablet Take 1 tablet by mouth every 6 (six) hours as needed for moderate pain.    Yes Historical Provider, MD  hydroxypropyl methylcellulose / hypromellose (ISOPTO TEARS / GONIOVISC) 2.5 % ophthalmic solution Place 1 drop into both eyes as needed for dry eyes.   Yes Historical Provider, MD  Multiple Vitamin (MULTIVITAMIN) tablet Take 1 tablet by mouth daily.   Yes Historical Provider, MD  omeprazole (PRILOSEC) 40 MG capsule Take 40 mg by mouth daily.     Yes Historical Provider, MD  zolpidem (AMBIEN) 10 MG tablet Take 10 mg by mouth at bedtime.    Yes Historical Provider, MD  predniSONE (DELTASONE) 50 MG tablet 1 tablet for 6 days, 1/2 tab for 6 days Patient not taking: Reported on 05/01/2014 01/16/14   Donnetta Hutching, MD  promethazine (PHENERGAN) 25 MG tablet Take 1 tablet (25 mg total) by mouth every 6  (six) hours as needed. Patient not taking: Reported on 05/01/2014 01/16/14   Donnetta Hutching, MD    Physical Exam: Blood pressure 111/88, pulse 114, temperature 98.9 F (37.2 C), temperature source Oral, resp. rate 20, height  (1.702 m), weight 50.349 kg (111 lb), SpO2 100 %. General: Awake, Oriented x3, Some distress from abdominal pain.  HEENT: EOMI, Moist mucous membranes Neck: Supple CV: S1 and S2 Lungs: Clear to ascultation bilaterally Abdomen: Soft, Nontender, Nondistended, +bowel sounds. Ext: Good pulses. Trace edema. No clubbing or cyanosis noted. Neuro: Cranial Nerves II-XII grossly intact. Has 5/5 motor strength in upper and lower extremities.  Lab results:  Recent Labs  05/01/14 1941  NA  133*  K 3.4*  CL 100  CO2 24  GLUCOSE 79  BUN 13  CREATININE 0.78  CALCIUM 8.4   No results for input(s): AST, ALT, ALKPHOS, BILITOT, PROT, ALBUMIN in the last 72 hours. No results for input(s): LIPASE, AMYLASE in the last 72 hours.  Recent Labs  05/01/14 1941  WBC 10.5  NEUTROABS 7.2  HGB 13.6  HCT 40.3  MCV 96.2  PLT 413*   No results for input(s): CKTOTAL, CKMB, CKMBINDEX, TROPONINI in the last 72 hours. Invalid input(s): POCBNP No results for input(s): DDIMER in the last 72 hours. No results for input(s): HGBA1C in the last 72 hours. No results for input(s): CHOL, HDL, LDLCALC, TRIG, CHOLHDL, LDLDIRECT in the last 72 hours. No results for input(s): TSH, T4TOTAL, T3FREE, THYROIDAB in the last 72 hours.  Invalid input(s): FREET3 No results for input(s): VITAMINB12, FOLATE, FERRITIN, TIBC, IRON, RETICCTPCT in the last 72 hours. Imaging results:  Ct Renal Stone Study  05/01/2014   CLINICAL DATA:  44 year old male with left flank, abdominal and pelvic pain for 3 days. Fever.  EXAM: CT ABDOMEN AND PELVIS WITHOUT CONTRAST  TECHNIQUE: Multidetector CT imaging of the abdomen and pelvis was performed following the standard protocol without IV contrast.  COMPARISON:  08/31/2011  and prior CTs  FINDINGS: Please note that parenchymal abnormalities may be missed without intravenous contrast.  Lower chest:  Unremarkable  Hepatobiliary: The liver and gallbladder are unremarkable. There is no evidence of biliary dilatation.  Pancreas: Unremarkable  Spleen: Unremarkable  Adrenals/Urinary Tract: A 5 mm calculus at left UVJ causes moderate left hydroureteronephrosis. Two adjacent 3 mm distal left ureteral calculi and 4 mm proximal-mid left ureteral calculus are also identified.  Two adjacent distal right ureteral calculi are identified measuring 3 mm and 4 mm, nonobstructing.  A 3 mm nonobstructing mid left renal calculus and 2 separate 4 mm nonobstructing mid right renal calculi are identified. Other punctate nonobstructing right lower calculi noted.  The adrenal glands are unremarkable.  Stomach/Bowel: No bowel obstruction or definite bowel wall thickening identified.  Vascular/Lymphatic: No enlarged lymph nodes or abdominal aortic aneurysm.  Reproductive: Prostate enlargement noted.  Other: No free fluid or pneumoperitoneum.  Musculoskeletal: A remote L2 superior endplate compression fracture is noted. No acute bony abnormalities a are identified. Moderate degenerative disc disease at L4-5 and L5-S1 noted.  IMPRESSION: Obstructing 5 mm left UVJ calculus causing moderate left hydroureteronephrosis.  Other nonobstructing bilateral ureteral calculi including two adjacent 3 mm distal left ureteral calculi, a 4 mm proximal -mid left ureteral calculus, and adjacent 3 mm and 4 mm distal right ureteral calculi.  Nonobstructing bilateral renal calculi.  Prostate enlargement.  Remote L2 superior endplate compression fracture.   Electronically Signed   By: Harmon Pier M.D.   On: 05/01/2014 20:43   Assessment & Plan by Problem: Obstructing 5 mm left UVJ calculus causing moderate left hydroureteronephrosis and nonobstructing bilateral ureteral and renal calculi. Management as per urology.  Appreciate  their input.    UTI with sepsis Patient started on ceftriaxone.  Further management of antibiotics depending on culture results and sensitivities.  If patient becomes hemodynamically unstable consider expanding antibiotic coverage with Zosyn.  Currently hemodynamically stable.  Hypokalemia Replace as needed.  Tobacco abuse Counseled on cessation.  Prophylaxis Lovenox to be started tomorrow.  CODE STATUS Full code.  Disposition Admit the patient to medical bed as inpatient.  Time spent on admission, talking to the patient, and coordinating care was: 50 mins.  Tkeyah Burkman A,  MD 05/01/2014, 10:14 PM

## 2014-05-01 NOTE — Anesthesia Procedure Notes (Signed)
Procedure Name: Intubation Date/Time: 05/01/2014 11:01 PM Performed by: Franco Nones Pre-anesthesia Checklist: Patient identified, Patient being monitored, Timeout performed, Emergency Drugs available and Suction available Patient Re-evaluated:Patient Re-evaluated prior to inductionOxygen Delivery Method: Circle System Utilized Preoxygenation: Pre-oxygenation with 100% oxygen Intubation Type: IV induction Ventilation: Mask ventilation without difficulty Laryngoscope Size: Miller and 2 Grade View: Grade I Tube type: Oral Tube size: 7.0 mm Number of attempts: 1 Airway Equipment and Method: Stylet Placement Confirmation: ETT inserted through vocal cords under direct vision,  positive ETCO2 and breath sounds checked- equal and bilateral Secured at: 21 cm Tube secured with: Tape Dental Injury: Teeth and Oropharynx as per pre-operative assessment

## 2014-05-01 NOTE — ED Provider Notes (Signed)
CSN: [id number]     Arrival date & time 05/01/14  1906 History   First MD Initiated Contact with Patient 05/01/14 1916     Chief Complaint  Patient presents with  . Abdominal Pain     (Consider location/radiation/quality/duration/timing/severity/associated sxs/prior Treatment) HPI Comments: Patient reports that he has been experiencing pain in the lower back, lower abdomen, testicles and perineum. Symptoms have been ongoing for 3 days. He reports that he has been experiencing sensation of needing to urinate but has only been urinating small amounts. He has been running a fever up to 103. He reports chills and sweats. He has had nausea and vomited one time. No diarrhea. No rectal bleeding.  Patient is a 44 y.o. male presenting with abdominal pain.  Abdominal Pain Associated symptoms: chills, fever, nausea and vomiting     Past Medical History  Diagnosis Date  . Condylomata acuminata in male     multiple procedures  . GERD (gastroesophageal reflux disease)   . Chronic back pain   . Pancreatitis     H/O  . Suicidal ideation     Admission in 2006 for suicidal ideation without attempt  . Anxiety     Disabled due to panic attacks  . Pancreatitis, alcoholic     admission 08/2011  . Multiple sclerosis     pt. states has 4 small brain lesions  . Shortness of breath     pt. states with exertion & at night  . Diabetes mellitus without complication     borederline  . Headache(784.0)   . Arthritis   . Depression   . Eye inflammation    Past Surgical History  Procedure Laterality Date  . Egd with ed by dr. Karilyn Cota  6/06    erosive RE with small sliding hh. HP neg, 35F  . Tcs by dr. Karilyn Cota  12/05    External hemorrhoids  . Electrocautery/dessication of condyloma of penis and perianal area  2009/2011    Dr. Leticia Penna  . Colonoscopy  10/08/2011    Jenkins:Normal colon/Anal condyloma without extension proximal to dentate line  . Flexible bronchoscopy N/A 08/12/2012    Procedure: FLEXIBLE  BRONCHOSCOPY;  Surgeon: Fredirick Maudlin, MD;  Location: [facility];  Service: Pulmonary;  Laterality: N/A;  . Esophagogastroduodenoscopy (egd) with propofol N/A 11/12/2012    JXB:JYNWGN esophagus s/p  passage of a Maloney dilator and biopsy. Abnormal gastric mucosa-status post biopsy  . Maloney dilation N/A 11/12/2012    Procedure: MALONEY DILATION (54mm);  Surgeon: Corbin Ade, MD;  Location: [facility];  Service: Endoscopy;  Laterality: N/A;  . Esophageal biopsy  11/12/2012    Procedure: GASTRIC AND ESOPHAGEAL BIOPSIES;  Surgeon: Corbin Ade, MD;  Location: [facility];  Service: Endoscopy;;   Family History  Problem Relation Age of Onset  . Lung cancer Father 42  . Colon cancer Neg Hx   . Colitis Neg Hx   . Cirrhosis Neg Hx   . Liver disease Neg Hx   . Pancreatic cancer Neg Hx   . Pancreatitis Neg Hx   . Anesthesia problems Neg Hx   . Hypotension Neg Hx   . Malignant hyperthermia Neg Hx   . Pseudochol deficiency Neg Hx    History  Substance Use Topics  . Smoking status: Current Every Day Smoker -- 1.00 packs/day for 20 years    Types: Cigarettes  . Smokeless tobacco: Not on file     Comment: 15 to 20 cigarrettes daily  . Alcohol Use: No  Comment:  none since 08/2011    Review of Systems  Constitutional: Positive for fever, chills and diaphoresis.  Gastrointestinal: Positive for nausea, vomiting and abdominal pain.  Genitourinary: Positive for urgency and testicular pain.  Musculoskeletal: Positive for back pain.  All other systems reviewed and are negative.     Allergies  Review of patient's allergies indicates no known allergies.  Home Medications   Prior to Admission medications   Medication Sig Start Date End Date Taking? Authorizing Provider  ALPRAZolam Prudy Feeler) 0.5 MG tablet Take 1 mg by mouth 4 (four) times daily. For anxiety 05/03/10  Yes Historical Provider, MD  Aspirin-Salicylamide-Caffeine (BC HEADACHE POWDER PO) Take 1 packet by mouth daily as needed (pain).     Yes Historical Provider, MD  calcium carbonate (OS-CAL) 600 MG TABS tablet Take 600 mg by mouth daily with breakfast.   Yes Historical Provider, MD  Cholecalciferol (VITAMIN D3) 5000 UNITS CAPS Take 1 capsule by mouth daily.   Yes Historical Provider, MD  Cyanocobalamin (B-12 PO) Take 1 tablet by mouth daily.   Yes Historical Provider, MD  fluticasone (FLONASE) 50 MCG/ACT nasal spray Place 1 spray into both nostrils daily.   Yes Historical Provider, MD  HYDROcodone-acetaminophen (NORCO) 10-325 MG per tablet Take 1 tablet by mouth every 6 (six) hours as needed for moderate pain.    Yes Historical Provider, MD  hydroxypropyl methylcellulose / hypromellose (ISOPTO TEARS / GONIOVISC) 2.5 % ophthalmic solution Place 1 drop into both eyes as needed for dry eyes.   Yes Historical Provider, MD  Multiple Vitamin (MULTIVITAMIN) tablet Take 1 tablet by mouth daily.   Yes Historical Provider, MD  omeprazole (PRILOSEC) 40 MG capsule Take 40 mg by mouth daily.     Yes Historical Provider, MD  zolpidem (AMBIEN) 10 MG tablet Take 10 mg by mouth at bedtime.    Yes Historical Provider, MD  predniSONE (DELTASONE) 50 MG tablet 1 tablet for 6 days, 1/2 tab for 6 days Patient not taking: Reported on 05/01/2014 01/16/14   Donnetta Hutching, MD  promethazine (PHENERGAN) 25 MG tablet Take 1 tablet (25 mg total) by mouth every 6 (six) hours as needed. Patient not taking: Reported on 05/01/2014 01/16/14   Donnetta Hutching, MD   BP 111/88 mmHg  Pulse 114  Temp(Src) 98.9 F (37.2 C) (Oral)  Resp 20  Ht  (1.702 m)  Wt 111 lb (50.349 kg)  BMI 17.38 kg/m2  SpO2 100% Physical Exam  Constitutional: He is oriented to person, place, and time. He appears well-developed and well-nourished. No distress.  HENT:  Head: Normocephalic and atraumatic.  Right Ear: Hearing normal.  Left Ear: Hearing normal.  Nose: Nose normal.  Mouth/Throat: Oropharynx is clear and moist and mucous membranes are normal.  Eyes: Conjunctivae and EOM are  normal. Pupils are equal, round, and reactive to light.  Neck: Normal range of motion. Neck supple.  Cardiovascular: Regular rhythm, S1 normal and S2 normal.  Exam reveals no gallop and no friction rub.   No murmur heard. Pulmonary/Chest: Effort normal and breath sounds normal. No respiratory distress. He exhibits no tenderness.  Abdominal: Soft. Normal appearance and bowel sounds are normal. There is no hepatosplenomegaly. There is tenderness in the suprapubic area. There is no rebound, no guarding, no tenderness at McBurney's point and negative Murphy's sign. No hernia.  Genitourinary: Rectal exam shows tenderness. Rectal exam shows no mass and anal tone normal. Prostate is enlarged and tender. Right testis shows no mass, no swelling and no tenderness.  Left testis shows no mass, no swelling and no tenderness. Circumcised. No penile erythema or penile tenderness. No discharge found.  Musculoskeletal: Normal range of motion.  Neurological: He is alert and oriented to person, place, and time. He has normal strength. No cranial nerve deficit or sensory deficit. Coordination normal. GCS eye subscore is 4. GCS verbal subscore is 5. GCS motor subscore is 6.  Skin: Skin is warm, dry and intact. No rash noted. No cyanosis.  Psychiatric: He has a normal mood and affect. His speech is normal and behavior is normal. Thought content normal.  Nursing note and vitals reviewed.   ED Course  Procedures (including critical care time) Labs Review Labs Reviewed  CBC WITH DIFFERENTIAL/PLATELET - Abnormal; Notable for the following:    RBC 4.19 (*)    Platelets 413 (*)    Monocytes Absolute 1.2 (*)    All other components within normal limits  BASIC METABOLIC PANEL - Abnormal; Notable for the following:    Sodium 133 (*)    Potassium 3.4 (*)    All other components within normal limits  URINALYSIS, ROUTINE W REFLEX MICROSCOPIC - Abnormal; Notable for the following:    Color, Urine AMBER (*)    APPearance  CLOUDY (*)    Specific Gravity, Urine >1.030 (*)    Hgb urine dipstick LARGE (*)    Bilirubin Urine SMALL (*)    Protein, ur 100 (*)    Nitrite POSITIVE (*)    Leukocytes, UA SMALL (*)    All other components within normal limits  URINE MICROSCOPIC-ADD ON - Abnormal; Notable for the following:    Bacteria, UA MANY (*)    All other components within normal limits  URINE CULTURE    Imaging Review Ct Renal Stone Study  05/01/2014   CLINICAL DATA:  44 year old male with left flank, abdominal and pelvic pain for 3 days. Fever.  EXAM: CT ABDOMEN AND PELVIS WITHOUT CONTRAST  TECHNIQUE: Multidetector CT imaging of the abdomen and pelvis was performed following the standard protocol without IV contrast.  COMPARISON:  08/31/2011 and prior CTs  FINDINGS: Please note that parenchymal abnormalities may be missed without intravenous contrast.  Lower chest:  Unremarkable  Hepatobiliary: The liver and gallbladder are unremarkable. There is no evidence of biliary dilatation.  Pancreas: Unremarkable  Spleen: Unremarkable  Adrenals/Urinary Tract: A 5 mm calculus at left UVJ causes moderate left hydroureteronephrosis. Two adjacent 3 mm distal left ureteral calculi and 4 mm proximal-mid left ureteral calculus are also identified.  Two adjacent distal right ureteral calculi are identified measuring 3 mm and 4 mm, nonobstructing.  A 3 mm nonobstructing mid left renal calculus and 2 separate 4 mm nonobstructing mid right renal calculi are identified. Other punctate nonobstructing right lower calculi noted.  The adrenal glands are unremarkable.  Stomach/Bowel: No bowel obstruction or definite bowel wall thickening identified.  Vascular/Lymphatic: No enlarged lymph nodes or abdominal aortic aneurysm.  Reproductive: Prostate enlargement noted.  Other: No free fluid or pneumoperitoneum.  Musculoskeletal: A remote L2 superior endplate compression fracture is noted. No acute bony abnormalities a are identified. Moderate  degenerative disc disease at L4-5 and L5-S1 noted.  IMPRESSION: Obstructing 5 mm left UVJ calculus causing moderate left hydroureteronephrosis.  Other nonobstructing bilateral ureteral calculi including two adjacent 3 mm distal left ureteral calculi, a 4 mm proximal -mid left ureteral calculus, and adjacent 3 mm and 4 mm distal right ureteral calculi.  Nonobstructing bilateral renal calculi.  Prostate enlargement.  Remote L2 superior endplate compression  fracture.   Electronically Signed   By: Harmon Pier M.D.   On: 05/01/2014 20:43     EKG Interpretation None      MDM   Final diagnoses:  Flank pain  Kidney stone  UTI (lower urinary tract infection)    Patient presents to the ER for evaluation of lower back pain, testicular pain. Patient reports symptoms been ongoing for 3 days. He does report a fever at home but does not have a temperature here in the ER. External Genitourinary examination was unremarkable. Rectal exam did reveal somewhat enlarged and tender prostate raising concern for possible prostatitis.  CT scan was performed to further evaluate for possible kidney stone versus proctitis causing his symptoms. CT scan shows multiple kidney stones. He has an obstructing stone at the left UVJ with 2 other mid ureter stones on the left. Patient has 2 additional nonobstructing stones in the ureter on the right.  Patients reports a fever of 103 at home. With the multiple stones that are bilateral, patient is at risk for sepsis. He was administered Rocephin.  Case discussed with Dr. Isabel Caprice, on call for urology. He will see the patient, take the patient to the OR for stone removal. Patient to be admitted to the hospitalist.    [name], MD 05/01/14 2147

## 2014-05-01 NOTE — H&P (View-Only) (Signed)
Urology Consult  Referring physician:Chris Pollina, MD Reason for referral: Bilateral ureteral calculi with left lower quadrant abdominal pain and evidence of concurrent febrile urinary tract infection  History of Present Illness: Patient is 44 years old without a prior urologic history. He presented to the emergency room complaining of some fairly recent increased lower back and lower abdominal discomfort. The pain is been left greater than right. He reported a fever of 103 this morning along with shaking chills. He did have some nausea. The patient subsequently was seen and evaluated in the ER. He was afebrile in the emergency room but was mildly tachycardic. Patient's urine showed significant bacteria along with positive nitrites and probable acute cystitis/pyelonephritis. CT scan revealed bilateral ureteral calculi. There was moderate left hydroureteronephrosis. The distal right ureteral stones did not appear to be obstructing. A 2 by the ER physician. While the patient was stable and currently afebrile we felt that given his history of fever earlier in the day with an abnormal urine and bilateral ureteral calculi that the prudent course would be bilateral ureteral stenting with admission with IV antibiotics and definitive management of his stone disease at a later date.  Past Medical History  Diagnosis Date  . Condylomata acuminata in male     multiple procedures  . GERD (gastroesophageal reflux disease)   . Chronic back pain   . Pancreatitis     H/O  . Suicidal ideation     Admission in 2006 for suicidal ideation without attempt  . Anxiety     Disabled due to panic attacks  . Pancreatitis, alcoholic     admission 09/2705  . Multiple sclerosis     pt. states has 4 small brain lesions  . Shortness of breath     pt. states with exertion & at night  . Diabetes mellitus without complication     borederline  . Headache(784.0)   . Arthritis   . Depression   . Eye inflammation    Past  Surgical History  Procedure Laterality Date  . Egd with ed by dr. Laural Golden  6/06    erosive RE with small sliding hh. HP neg, 28F  . Tcs by dr. Laural Golden  12/05    External hemorrhoids  . Electrocautery/dessication of condyloma of penis and perianal area  2009/2011    Dr. Geroge Baseman  . Colonoscopy  10/08/2011    Jenkins:Normal colon/Anal condyloma without extension proximal to dentate line  . Flexible bronchoscopy N/A 08/12/2012    Procedure: FLEXIBLE BRONCHOSCOPY;  Surgeon: Alonza Bogus, MD;  Location: [facility];  Service: Pulmonary;  Laterality: N/A;  . Esophagogastroduodenoscopy (egd) with propofol N/A 11/12/2012    [id number] esophagus s/p  passage of a Maloney dilator and biopsy. Abnormal gastric mucosa-status post biopsy  . Maloney dilation N/A 11/12/2012    Procedure: MALONEY DILATION (56m);  Surgeon: RDaneil Dolin MD;  Location: [facility];  Service: Endoscopy;  Laterality: N/A;  . Esophageal biopsy  11/12/2012    Procedure: GASTRIC AND ESOPHAGEAL BIOPSIES;  Surgeon: RDaneil Dolin MD;  Location: [facility];  Service: Endoscopy;;    Medications:  Prior to Admission:  Prescriptions prior to admission  Medication Sig Dispense Refill Last Dose  . ALPRAZolam (XANAX) 0.5 MG tablet Take 1 mg by mouth 4 (four) times daily. For anxiety   05/01/2014 at Unknown time  . Aspirin-Salicylamide-Caffeine (BC HEADACHE POWDER PO) Take 1 packet by mouth daily as needed (pain).    Past Week at Unknown time  . calcium carbonate (OS-CAL)  600 MG TABS tablet Take 600 mg by mouth daily with breakfast.   05/01/2014 at Unknown time  . Cholecalciferol (VITAMIN D3) 5000 UNITS CAPS Take 1 capsule by mouth daily.   05/01/2014 at Unknown time  . Cyanocobalamin (B-12 PO) Take 1 tablet by mouth daily.   05/01/2014 at Unknown time  . fluticasone (FLONASE) 50 MCG/ACT nasal spray Place 1 spray into both nostrils daily.   05/01/2014 at Unknown time  . HYDROcodone-acetaminophen (NORCO) 10-325 MG per tablet Take 1 tablet by mouth every 6  (six) hours as needed for moderate pain.    05/01/2014 at Unknown time  . hydroxypropyl methylcellulose / hypromellose (ISOPTO TEARS / GONIOVISC) 2.5 % ophthalmic solution Place 1 drop into both eyes as needed for dry eyes.   05/01/2014 at Unknown time  . Multiple Vitamin (MULTIVITAMIN) tablet Take 1 tablet by mouth daily.   05/01/2014 at Unknown time  . omeprazole (PRILOSEC) 40 MG capsule Take 40 mg by mouth daily.     05/01/2014 at Unknown time  . zolpidem (AMBIEN) 10 MG tablet Take 10 mg by mouth at bedtime.    04/30/2014 at Unknown time  . predniSONE (DELTASONE) 50 MG tablet 1 tablet for 6 days, 1/2 tab for 6 days (Patient not taking: Reported on 05/01/2014) 9 tablet 1   . promethazine (PHENERGAN) 25 MG tablet Take 1 tablet (25 mg total) by mouth every 6 (six) hours as needed. (Patient not taking: Reported on 05/01/2014) 20 tablet 0     Allergies: No Known Allergies  Family History  Problem Relation Age of Onset  . Lung cancer Father 18  . Colon cancer Neg Hx   . Colitis Neg Hx   . Cirrhosis Neg Hx   . Liver disease Neg Hx   . Pancreatic cancer Neg Hx   . Pancreatitis Neg Hx   . Anesthesia problems Neg Hx   . Hypotension Neg Hx   . Malignant hyperthermia Neg Hx   . Pseudochol deficiency Neg Hx     Social History:  reports that he has been smoking Cigarettes.  He has a 20 pack-year smoking history. He does not have any smokeless tobacco history on file. He reports that he does not drink alcohol or use illicit drugs.  ROS positive for the above-mentioned issues within the history of present illness. He denies any gross hematuria. He has had no severe dysuria. He does report some nonspecific bilateral testicular discomfort. Otherwise his review of systems is negative.  Physical Exam:  Vital signs in last 24 hours: Temp:  [98.9 F (37.2 C)] 98.9 F (37.2 C) (03/20 1910) Pulse Rate:  [114] 114 (03/20 1910) Resp:  [20] 20 (03/20 1910) BP: (111)/(88) 111/88 mmHg (03/20 1910) SpO2:  [100  %] 100 % (03/20 1910) Weight:  [50.349 kg (111 lb)] 50.349 kg (111 lb) (03/20 1910)  Constitutional: Vital signs reviewed. WD WN in NAD Head: Normocephalic and atraumatic   Eyes: PERRL, No scleral icterus.  Neck: Supple No  Gross JVD Cardiovascular: RRR Pulmonary/Chest: Normal effort Abdominal: Soft. Mild left lower quadrant abdominal tenderness Genitourinary: Normal external genitalia Extremities: No cyanosis or edema  Neurological: Grossly non-focal.  Skin: Warm,very dry and intact. No rash, cyanosis   Laboratory Data:  Results for orders placed or performed during the hospital encounter of 05/01/14 (from the past 72 hour(s))  CBC with Differential/Platelet     Status: Abnormal   Collection Time: 05/01/14  7:41 PM  Result Value Ref Range   WBC 10.5 4.0 -  10.5 K/uL   RBC 4.19 (L) 4.22 - 5.81 MIL/uL   Hemoglobin 13.6 13.0 - 17.0 g/dL   HCT 40.3 39.0 - 52.0 %   MCV 96.2 78.0 - 100.0 fL   MCH 32.5 26.0 - 34.0 pg   MCHC 33.7 30.0 - 36.0 g/dL   RDW 14.0 11.5 - 15.5 %   Platelets 413 (H) 150 - 400 K/uL   Neutrophils Relative % 69 43 - 77 %   Neutro Abs 7.2 1.7 - 7.7 K/uL   Lymphocytes Relative 19 12 - 46 %   Lymphs Abs 2.0 0.7 - 4.0 K/uL   Monocytes Relative 11 3 - 12 %   Monocytes Absolute 1.2 (H) 0.1 - 1.0 K/uL   Eosinophils Relative 1 0 - 5 %   Eosinophils Absolute 0.1 0.0 - 0.7 K/uL   Basophils Relative 0 0 - 1 %   Basophils Absolute 0.0 0.0 - 0.1 K/uL  Basic metabolic panel     Status: Abnormal   Collection Time: 05/01/14  7:41 PM  Result Value Ref Range   Sodium 133 (L) 135 - 145 mmol/L   Potassium 3.4 (L) 3.5 - 5.1 mmol/L   Chloride 100 96 - 112 mmol/L   CO2 24 19 - 32 mmol/L   Glucose, Bld 79 70 - 99 mg/dL   BUN 13 6 - 23 mg/dL   Creatinine, Ser 0.78 0.50 - 1.35 mg/dL   Calcium 8.4 8.4 - 10.5 mg/dL   GFR calc non Af Amer >90 >90 mL/min   GFR calc Af Amer >90 >90 mL/min    Comment: (NOTE) The eGFR has been calculated using the CKD EPI equation. This  calculation has not been validated in all clinical situations. eGFR's persistently <90 mL/min signify possible Chronic Kidney Disease.    Anion gap 9 5 - 15  Urinalysis, Routine w reflex microscopic     Status: Abnormal   Collection Time: 05/01/14  8:07 PM  Result Value Ref Range   Color, Urine AMBER (A) YELLOW    Comment: BIOCHEMICALS MAY BE AFFECTED BY COLOR   APPearance CLOUDY (A) CLEAR   Specific Gravity, Urine >1.030 (H) 1.005 - 1.030   pH 5.5 5.0 - 8.0   Glucose, UA NEGATIVE NEGATIVE mg/dL   Hgb urine dipstick LARGE (A) NEGATIVE   Bilirubin Urine SMALL (A) NEGATIVE   Ketones, ur NEGATIVE NEGATIVE mg/dL   Protein, ur 100 (A) NEGATIVE mg/dL   Urobilinogen, UA 1.0 0.0 - 1.0 mg/dL   Nitrite POSITIVE (A) NEGATIVE   Leukocytes, UA SMALL (A) NEGATIVE  Urine microscopic-add on     Status: Abnormal   Collection Time: 05/01/14  8:07 PM  Result Value Ref Range   Squamous Epithelial / LPF RARE RARE   WBC, UA 21-50 <3 WBC/hpf   RBC / HPF TOO NUMEROUS TO COUNT <3 RBC/hpf   Bacteria, UA MANY (A) RARE   No results found for this or any previous visit (from the past 240 hour(s)). Creatinine:  Recent Labs  05/01/14 1941  CREATININE 0.78   Baseline Creatinine:   Impression/Assessment:  Bilateral ureteral calculi. He does have evidence of obstruction on the left side. Renal function is normal. He is currently afebrile without evidence of gross septicemia but given his fever 203 with shaking chills this morning and the fact that he does have a markedly abnormal urinalysis with bilateral ureteral stones would suggest a high risk of developing sepsis without adequate decompression of the urinary tract. I would recommend bilateral double-J stents  with admission to the hospital. We will ask the hospitalist service to admit the patient and to care for him. Urology will follow up on Tuesday morning or sooner when necessary.  Plan:  Urgent surgical intervention tonight with cystoscopy, bilateral  retrograde pyelography and bilateral double-J stent placement. The patient will then be admitted to the hospitalist service. We will be available for significant issues related to his surgical procedure. Otherwise he will be managed by the hospitalist service and followed by urology. He will require definitive surgical intervention at a later date. His follow-up can be at the [facility] urology office in [place].  Deniya Craigo S 05/01/2014, 10:48 PM

## 2014-05-01 NOTE — Consult Note (Signed)
Urology Consult  Referring physician:Chris Pollina, MD Reason for referral: Bilateral ureteral calculi with left lower quadrant abdominal pain and evidence of concurrent febrile urinary tract infection  History of Present Illness: Patient is 44 years old without a prior urologic history. He presented to the emergency room complaining of some fairly recent increased lower back and lower abdominal discomfort. The pain is been left greater than right. He reported a fever of 103 this morning along with shaking chills. He did have some nausea. The patient subsequently was seen and evaluated in the ER. He was afebrile in the emergency room but was mildly tachycardic. Patient's urine showed significant bacteria along with positive nitrites and probable acute cystitis/pyelonephritis. CT scan revealed bilateral ureteral calculi. There was moderate left hydroureteronephrosis. The distal right ureteral stones did not appear to be obstructing. A 2 by the ER physician. While the patient was stable and currently afebrile we felt that given his history of fever earlier in the day with an abnormal urine and bilateral ureteral calculi that the prudent course would be bilateral ureteral stenting with admission with IV antibiotics and definitive management of his stone disease at a later date.  Past Medical History  Diagnosis Date  . Condylomata acuminata in male     multiple procedures  . GERD (gastroesophageal reflux disease)   . Chronic back pain   . Pancreatitis     H/O  . Suicidal ideation     Admission in 2006 for suicidal ideation without attempt  . Anxiety     Disabled due to panic attacks  . Pancreatitis, alcoholic     admission 06/6387  . Multiple sclerosis     pt. states has 4 small brain lesions  . Shortness of breath     pt. states with exertion & at night  . Diabetes mellitus without complication     borederline  . Headache(784.0)   . Arthritis   . Depression   . Eye inflammation    Past  Surgical History  Procedure Laterality Date  . Egd with ed by dr. Laural Golden  6/06    erosive RE with small sliding hh. HP neg, 31F  . Tcs by dr. Laural Golden  12/05    External hemorrhoids  . Electrocautery/dessication of condyloma of penis and perianal area  2009/2011    Dr. Geroge Baseman  . Colonoscopy  10/08/2011    Jenkins:Normal colon/Anal condyloma without extension proximal to dentate line  . Flexible bronchoscopy N/A 08/12/2012    Procedure: FLEXIBLE BRONCHOSCOPY;  Surgeon: Alonza Bogus, MD;  Location: [facility];  Service: Pulmonary;  Laterality: N/A;  . 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 Abnormal gastric mucosa-status post biopsy  . Maloney dilation N/A 11/12/2012    Procedure: MALONEY DILATION (42m);  Surgeon: RDaneil Dolin MD;  Location: [facility];  Service: Endoscopy;  Laterality: N/A;  . Esophageal biopsy  11/12/2012    Procedure: GASTRIC AND ESOPHAGEAL BIOPSIES;  Surgeon: RDaneil Dolin MD;  Location: [facility];  Service: Endoscopy;;    Medications:  Prior to Admission:  Prescriptions prior to admission  Medication Sig Dispense Refill Last Dose  . ALPRAZolam (XANAX) 0.5 MG tablet Take 1 mg by mouth 4 (four) times daily. For anxiety   05/01/2014 at Unknown time  . Aspirin-Salicylamide-Caffeine (BC HEADACHE POWDER PO) Take 1 packet by mouth daily as needed (pain).    Past Week at Unknown time  . calcium carbonate (OS-CAL)  600 MG TABS tablet Take 600 mg by mouth daily with breakfast.   05/01/2014 at Unknown time  . Cholecalciferol (VITAMIN D3) 5000 UNITS CAPS Take 1 capsule by mouth daily.   05/01/2014 at Unknown time  . Cyanocobalamin (B-12 PO) Take 1 tablet by mouth daily.   05/01/2014 at Unknown time  . fluticasone (FLONASE) 50 MCG/ACT nasal spray Place 1 spray into both nostrils daily.   05/01/2014 at Unknown time  . HYDROcodone-acetaminophen (NORCO) 10-325 MG per tablet Take 1 tablet by mouth every 6  (six) hours as needed for moderate pain.    05/01/2014 at Unknown time  . hydroxypropyl methylcellulose / hypromellose (ISOPTO TEARS / GONIOVISC) 2.5 % ophthalmic solution Place 1 drop into both eyes as needed for dry eyes.   05/01/2014 at Unknown time  . Multiple Vitamin (MULTIVITAMIN) tablet Take 1 tablet by mouth daily.   05/01/2014 at Unknown time  . omeprazole (PRILOSEC) 40 MG capsule Take 40 mg by mouth daily.     05/01/2014 at Unknown time  . zolpidem (AMBIEN) 10 MG tablet Take 10 mg by mouth at bedtime.    04/30/2014 at Unknown time  . predniSONE (DELTASONE) 50 MG tablet 1 tablet for 6 days, 1/2 tab for 6 days (Patient not taking: Reported on 05/01/2014) 9 tablet 1   . promethazine (PHENERGAN) 25 MG tablet Take 1 tablet (25 mg total) by mouth every 6 (six) hours as needed. (Patient not taking: Reported on 05/01/2014) 20 tablet 0     Allergies: No Known Allergies  Family History  Problem Relation Age of Onset  . Lung cancer Father 66  . Colon cancer Neg Hx   . Colitis Neg Hx   . Cirrhosis Neg Hx   . Liver disease Neg Hx   . Pancreatic cancer Neg Hx   . Pancreatitis Neg Hx   . Anesthesia problems Neg Hx   . Hypotension Neg Hx   . Malignant hyperthermia Neg Hx   . Pseudochol deficiency Neg Hx     Social History:  reports that he has been smoking Cigarettes.  He has a 20 pack-year smoking history. He does not have any smokeless tobacco history on file. He reports that he does not drink alcohol or use illicit drugs.  ROS positive for the above-mentioned issues within the history of present illness. He denies any gross hematuria. He has had no severe dysuria. He does report some nonspecific bilateral testicular discomfort. Otherwise his review of systems is negative.  Physical Exam:  Vital signs in last 24 hours: Temp:  [98.9 F (37.2 C)] 98.9 F (37.2 C) (03/20 1910) Pulse Rate:  [114] 114 (03/20 1910) Resp:  [20] 20 (03/20 1910) BP: (111)/(88) 111/88 mmHg (03/20 1910) SpO2:  [100  %] 100 % (03/20 1910) Weight:  [50.349 kg (111 lb)] 50.349 kg (111 lb) (03/20 1910)  Constitutional: Vital signs reviewed. WD WN in NAD Head: Normocephalic and atraumatic   Eyes: PERRL, No scleral icterus.  Neck: Supple No  Gross JVD Cardiovascular: RRR Pulmonary/Chest: Normal effort Abdominal: Soft. Mild left lower quadrant abdominal tenderness Genitourinary: Normal external genitalia Extremities: No cyanosis or edema  Neurological: Grossly non-focal.  Skin: Warm,very dry and intact. No rash, cyanosis   Laboratory Data:  Results for orders placed or performed during the hospital encounter of 05/01/14 (from the past 72 hour(s))  CBC with Differential/Platelet     Status: Abnormal   Collection Time: 05/01/14  7:41 PM  Result Value Ref Range   WBC 10.5 4.0 -  10.5 K/uL   RBC 4.19 (L) 4.22 - 5.81 MIL/uL   Hemoglobin 13.6 13.0 - 17.0 g/dL   HCT 40.3 39.0 - 52.0 %   MCV 96.2 78.0 - 100.0 fL   MCH 32.5 26.0 - 34.0 pg   MCHC 33.7 30.0 - 36.0 g/dL   RDW 14.0 11.5 - 15.5 %   Platelets 413 (H) 150 - 400 K/uL   Neutrophils Relative % 69 43 - 77 %   Neutro Abs 7.2 1.7 - 7.7 K/uL   Lymphocytes Relative 19 12 - 46 %   Lymphs Abs 2.0 0.7 - 4.0 K/uL   Monocytes Relative 11 3 - 12 %   Monocytes Absolute 1.2 (H) 0.1 - 1.0 K/uL   Eosinophils Relative 1 0 - 5 %   Eosinophils Absolute 0.1 0.0 - 0.7 K/uL   Basophils Relative 0 0 - 1 %   Basophils Absolute 0.0 0.0 - 0.1 K/uL  Basic metabolic panel     Status: Abnormal   Collection Time: 05/01/14  7:41 PM  Result Value Ref Range   Sodium 133 (L) 135 - 145 mmol/L   Potassium 3.4 (L) 3.5 - 5.1 mmol/L   Chloride 100 96 - 112 mmol/L   CO2 24 19 - 32 mmol/L   Glucose, Bld 79 70 - 99 mg/dL   BUN 13 6 - 23 mg/dL   Creatinine, Ser 0.78 0.50 - 1.35 mg/dL   Calcium 8.4 8.4 - 10.5 mg/dL   GFR calc non Af Amer >90 >90 mL/min   GFR calc Af Amer >90 >90 mL/min    Comment: (NOTE) The eGFR has been calculated using the CKD EPI equation. This  calculation has not been validated in all clinical situations. eGFR's persistently <90 mL/min signify possible Chronic Kidney Disease.    Anion gap 9 5 - 15  Urinalysis, Routine w reflex microscopic     Status: Abnormal   Collection Time: 05/01/14  8:07 PM  Result Value Ref Range   Color, Urine AMBER (A) YELLOW    Comment: BIOCHEMICALS MAY BE AFFECTED BY COLOR   APPearance CLOUDY (A) CLEAR   Specific Gravity, Urine >1.030 (H) 1.005 - 1.030   pH 5.5 5.0 - 8.0   Glucose, UA NEGATIVE NEGATIVE mg/dL   Hgb urine dipstick LARGE (A) NEGATIVE   Bilirubin Urine SMALL (A) NEGATIVE   Ketones, ur NEGATIVE NEGATIVE mg/dL   Protein, ur 100 (A) NEGATIVE mg/dL   Urobilinogen, UA 1.0 0.0 - 1.0 mg/dL   Nitrite POSITIVE (A) NEGATIVE   Leukocytes, UA SMALL (A) NEGATIVE  Urine microscopic-add on     Status: Abnormal   Collection Time: 05/01/14  8:07 PM  Result Value Ref Range   Squamous Epithelial / LPF RARE RARE   WBC, UA 21-50 <3 WBC/hpf   RBC / HPF TOO NUMEROUS TO COUNT <3 RBC/hpf   Bacteria, UA MANY (A) RARE   No results found for this or any previous visit (from the past 240 hour(s)). Creatinine:  Recent Labs  05/01/14 1941  CREATININE 0.78   Baseline Creatinine:   Impression/Assessment:  Bilateral ureteral calculi. He does have evidence of obstruction on the left side. Renal function is normal. He is currently afebrile without evidence of gross septicemia but given his fever 203 with shaking chills this morning and the fact that he does have a markedly abnormal urinalysis with bilateral ureteral stones would suggest a high risk of developing sepsis without adequate decompression of the urinary tract. I would recommend bilateral double-J stents  with admission to the hospital. We will ask the hospitalist service to admit the patient and to care for him. Urology will follow up on Tuesday morning or sooner when necessary.  Plan:  Urgent surgical intervention tonight with cystoscopy, bilateral  retrograde pyelography and bilateral double-J stent placement. The patient will then be admitted to the hospitalist service. We will be available for significant issues related to his surgical procedure. Otherwise he will be managed by the hospitalist service and followed by urology. He will require definitive surgical intervention at a later date. His follow-up can be at the [facility] urology office in [place].  Maleah Rabago S 05/01/2014, 10:48 PM

## 2014-05-01 NOTE — Interval H&P Note (Signed)
History and Physical Interval Note:  05/01/2014 10:54 PM  Brent [name]  has presented today for surgery, with the diagnosis of bilateral ureteral calculi  The various methods of treatment have been discussed with the patient and family. After consideration of risks, benefits and other options for treatment, the patient has consented to  Procedure(s): CYSTOSCOPY WITH BILATERAL RETROGRADE PYELOGRAM; BILATERAL URETERAL STENT PLACEMENT (Bilateral) as a surgical intervention .  The patient's history has been reviewed, patient examined, no change in status, stable for surgery.  I have reviewed the patient's chart and labs.  Questions were answered to the patient's satisfaction.     Delitha Elms S

## 2014-05-01 NOTE — Anesthesia Preprocedure Evaluation (Addendum)
Anesthesia Evaluation  Patient identified by MRN, date of birth, ID band Patient awake    Reviewed: Allergy & Precautions, NPO status , Patient's Chart, lab work & pertinent test results, reviewed documented beta blocker date and time   Airway Mallampati: II  TM Distance: <3 FB Neck ROM: Full    Dental  (+) Edentulous Upper Upper teeth extractions 1 week ago. Few remaining teeth lower front:   Pulmonary shortness of breath and at rest, Current Smoker,          Cardiovascular Exercise Tolerance: Poor     Neuro/Psych  Headaches, PSYCHIATRIC DISORDERS Anxiety Depression MS diagnosis recently 4 lesions as reported by patient    GI/Hepatic GERD-  Medicated and Controlled,  Endo/Other  diabetesDenies DM, states he has been on predninolone  about 18 months to 2 years. Off of steroids times  2 months  Renal/GU Renal diseaseobstruction     Musculoskeletal  (+) Arthritis -, Osteoarthritis,    Abdominal   Peds  Hematology   Anesthesia Other Findings   Reproductive/Obstetrics                            Anesthesia Physical Anesthesia Plan  ASA: II and emergent  Anesthesia Plan: General   Post-op Pain Management:    Induction: Intravenous, Rapid sequence and Cricoid pressure planned  Airway Management Planned: Oral ETT  Additional Equipment:   Intra-op Plan:   Post-operative Plan: Extubation in OR  Informed Consent:   Dental advisory given  Plan Discussed with: Anesthesiologist and Surgeon  Anesthesia Plan Comments:         Anesthesia Quick Evaluation

## 2014-05-01 NOTE — Op Note (Signed)
Preoperative diagnosis: Bilateral ureteral calculi with febrile urinary tract infection Postoperative diagnosis: Same  Procedure: Cystoscopy, bilateral retrograde pyelography, bilateral double-J stent placement   Surgeon: Valetta Fuller M.D.  Anesthesia: Gen.  Indications: 44 year old male who presented to the ER with nonspecific abdominal complaints fever and chills. Urinalysis was suspicious for cystitis/pyelonephritis. CT imaging revealed multiple bilateral ureteral calculi with left-sided hydronephrosis. The right-sided stones appear to be nonobstructing. We were contacted by the emergency room and felt that given the fever with bilateral ureteral stones at the prudent maneuver was to place double-J stents be sure that he had adequate drainage. We have asked hospitalist service to admit him to their service overnight.     Technique and findings: Patient was brought to the operating room where he had successful induction of general anesthesia. He was placed in lithotomy position and prepped and draped in usual manner. Appropriate surgical timeout was performed. Cystoscopy revealed unremarkable anterior urethra prostatic urethra and bladder. Bilateral retrograde pyelograms were performed. His were done with fluoroscopic interpretation. There were multiple filling defects within both ureters. Both ureters were dilated left greater than right.   Guidewires were placed to both renal pelvic thigh. 6 French 24 cm double-J stents were placed bilaterally. No obvious complications or problems occurred. Patient is brought to PACU in stable condition.

## 2014-05-01 NOTE — Transfer of Care (Signed)
Immediate Anesthesia Transfer of Care Note  Patient: Brent [name]  Procedure(s) Performed: Procedure(s): CYSTOSCOPY WITH BILATERAL RETROGRADE PYELOGRAM; BILATERAL URETERAL STENT PLACEMENT (Bilateral)  Patient Location: PACU  Anesthesia Type:General  Level of Consciousness: awake, alert  and patient cooperative  Airway & Oxygen Therapy: Patient Spontanous Breathing and Patient connected to nasal cannula oxygen  Post-op Assessment: Report given to RN, Post -op Vital signs reviewed and stable and Patient moving all extremities  Post vital signs: Reviewed and stable  Last Vitals:  Filed Vitals:   05/01/14 1910  BP: 111/88  Pulse: 114  Temp: 37.2 C  Resp: 20    Complications: No apparent anesthesia complications

## 2014-05-01 NOTE — ED Notes (Signed)
Patient states that he is having lower back pain, pain in abdomen, pain in testicles. Having episodes of breaking out in a sweat. Hurts and burns with urination. Fever has been up to 103 per pt.

## 2014-05-02 DIAGNOSIS — N201 Calculus of ureter: Secondary | ICD-10-CM | POA: Diagnosis present

## 2014-05-02 LAB — BASIC METABOLIC PANEL
Anion gap: 9 (ref 5–15)
BUN: 8 mg/dL (ref 6–23)
CHLORIDE: 105 mmol/L (ref 96–112)
CO2: 20 mmol/L (ref 19–32)
Calcium: 8.3 mg/dL — ABNORMAL LOW (ref 8.4–10.5)
Creatinine, Ser: 0.84 mg/dL (ref 0.50–1.35)
GFR calc Af Amer: >90 mL/min (ref >90)
GFR calc non Af Amer: >90 mL/min (ref >90)
Glucose, Bld: 217 mg/dL — ABNORMAL HIGH (ref 70–99)
Potassium: 4 mmol/L (ref 3.5–5.1)
SODIUM: 134 mmol/L — AB (ref 135–145)

## 2014-05-02 LAB — CBC
HEMATOCRIT: 39.2 % (ref 39.0–52.0)
Hemoglobin: 12.7 g/dL — ABNORMAL LOW (ref 13.0–17.0)
MCH: 31.1 pg (ref 26.0–34.0)
MCHC: 32.4 g/dL (ref 30.0–36.0)
MCV: 96.1 fL (ref 78.0–100.0)
Platelets: 431 10*3/uL — ABNORMAL HIGH (ref 150–400)
RBC: 4.08 MIL/uL — AB (ref 4.22–5.81)
RDW: 14 % (ref 11.5–15.5)
WBC: 10.3 10*3/uL (ref 4.0–10.5)

## 2014-05-02 LAB — OCCULT BLOOD, POC DEVICE: FECAL OCCULT BLD: NEGATIVE

## 2014-05-02 MED ORDER — CEFTRIAXONE SODIUM IN DEXTROSE 20 MG/ML IV SOLN
1.0000 g | INTRAVENOUS | Status: DC
  Filled 2014-05-02: qty 50

## 2014-05-02 MED ORDER — HEPARIN SODIUM (PORCINE) 5000 UNIT/ML IJ SOLN: 5000.0000 [IU] | Freq: Three times a day (TID) | INTRAMUSCULAR | Status: DC

## 2014-05-02 MED ORDER — SODIUM CHLORIDE 0.9 % IV SOLN: INTRAVENOUS | Status: DC

## 2014-05-02 MED ORDER — HYDROCODONE-ACETAMINOPHEN 10-325 MG PO TABS: 1.0000 | ORAL_TABLET | Freq: Four times a day (QID) | ORAL | Status: DC | PRN

## 2014-05-02 MED ORDER — LIDOCAINE HCL 2 % EX GEL
CUTANEOUS | Status: DC | PRN
  Administered 2014-05-01: 1 via URETHRAL

## 2014-05-02 MED ORDER — ALPRAZOLAM 1 MG PO TABS
1.0000 mg | ORAL_TABLET | Freq: Four times a day (QID) | ORAL | Status: DC
  Administered 2014-05-02 – 2014-05-03 (×5): 1 mg via ORAL
  Filled 2014-05-02 (×5): qty 1

## 2014-05-02 MED ORDER — ONDANSETRON HCL 4 MG/2ML IJ SOLN: 4.0000 mg | Freq: Four times a day (QID) | INTRAMUSCULAR | Status: DC | PRN

## 2014-05-02 MED ORDER — ADULT MULTIVITAMIN W/MINERALS CH
1.0000 | ORAL_TABLET | Freq: Every day | ORAL | Status: DC
  Administered 2014-05-02 – 2014-05-03 (×2): 1 via ORAL
  Filled 2014-05-02 (×2): qty 1

## 2014-05-02 MED ORDER — PANTOPRAZOLE SODIUM 40 MG PO TBEC
80.0000 mg | DELAYED_RELEASE_TABLET | Freq: Every day | ORAL | Status: DC
  Administered 2014-05-02 – 2014-05-03 (×2): 80 mg via ORAL
  Filled 2014-05-02 (×2): qty 2

## 2014-05-02 MED ORDER — CEFTRIAXONE SODIUM IN DEXTROSE 20 MG/ML IV SOLN: 1.0000 g | INTRAVENOUS | Status: DC

## 2014-05-02 MED ORDER — ENOXAPARIN SODIUM 40 MG/0.4ML ~~LOC~~ SOLN
40.0000 mg | SUBCUTANEOUS | Status: DC
Start: 2014-05-02 — End: 2014-05-03
  Administered 2014-05-02: 40 mg via SUBCUTANEOUS
  Filled 2014-05-02 (×2): qty 0.4

## 2014-05-02 MED ORDER — OXYCODONE-ACETAMINOPHEN 5-325 MG PO TABS
1.0000 | ORAL_TABLET | ORAL | Status: DC | PRN
  Administered 2014-05-02 – 2014-05-03 (×3): 2 via ORAL
  Filled 2014-05-02 (×4): qty 2

## 2014-05-02 MED ORDER — KCL IN DEXTROSE-NACL 20-5-0.45 MEQ/L-%-% IV SOLN
INTRAVENOUS | Status: DC
  Administered 2014-05-02: 01:00:00 via INTRAVENOUS

## 2014-05-02 MED ORDER — DEXTROSE 5 % IV SOLN
1.0000 g | INTRAVENOUS | Status: DC
  Administered 2014-05-02: 1 g via INTRAVENOUS
  Filled 2014-05-02 (×2): qty 10

## 2014-05-02 MED ORDER — ONDANSETRON HCL 4 MG/2ML IJ SOLN: 4.0000 mg | INTRAMUSCULAR | Status: DC | PRN

## 2014-05-02 MED ORDER — DEXTROSE 5 % IV SOLN
INTRAVENOUS | Status: AC
  Filled 2014-05-02: qty 10

## 2014-05-02 MED ORDER — STERILE WATER FOR IRRIGATION IR SOLN
Status: DC | PRN
  Administered 2014-05-01: 1000 mL

## 2014-05-02 MED ORDER — VITAMIN D 1000 UNITS PO TABS
5000.0000 [IU] | ORAL_TABLET | Freq: Every day | ORAL | Status: DC
  Administered 2014-05-02 – 2014-05-03 (×2): 5000 [IU] via ORAL
  Filled 2014-05-02 (×2): qty 5

## 2014-05-02 MED ORDER — OXYBUTYNIN CHLORIDE 5 MG PO TABS
5.0000 mg | ORAL_TABLET | Freq: Three times a day (TID) | ORAL | Status: DC | PRN
  Administered 2014-05-02: 5 mg via ORAL
  Filled 2014-05-02: qty 1

## 2014-05-02 MED ORDER — FLUTICASONE PROPIONATE 50 MCG/ACT NA SUSP
1.0000 | Freq: Every day | NASAL | Status: DC
  Administered 2014-05-02 – 2014-05-03 (×2): 1 via NASAL
  Filled 2014-05-02: qty 16

## 2014-05-02 MED ORDER — CETYLPYRIDINIUM CHLORIDE 0.05 % MT LIQD
7.0000 mL | Freq: Two times a day (BID) | OROMUCOSAL | Status: DC
  Administered 2014-05-02 – 2014-05-03 (×3): 7 mL via OROMUCOSAL

## 2014-05-02 MED ORDER — POLYVINYL ALCOHOL 1.4 % OP SOLN: 1.0000 [drp] | OPHTHALMIC | Status: DC | PRN

## 2014-05-02 MED ORDER — ENSURE COMPLETE PO LIQD
237.0000 mL | Freq: Two times a day (BID) | ORAL | Status: DC
  Administered 2014-05-02 (×2): 237 mL via ORAL

## 2014-05-02 MED ORDER — ZOLPIDEM TARTRATE 5 MG PO TABS
10.0000 mg | ORAL_TABLET | Freq: Every day | ORAL | Status: DC
  Administered 2014-05-02 (×2): 10 mg via ORAL
  Filled 2014-05-02 (×2): qty 2

## 2014-05-02 MED ORDER — HYPROMELLOSE (GONIOSCOPIC) 2.5 % OP SOLN
1.0000 [drp] | OPHTHALMIC | Status: DC | PRN
  Filled 2014-05-02: qty 15

## 2014-05-02 MED ORDER — CALCIUM CARBONATE 600 MG PO TABS: 600.0000 mg | ORAL_TABLET | Freq: Every day | ORAL | Status: DC

## 2014-05-02 MED ORDER — NICOTINE 21 MG/24HR TD PT24
21.0000 mg | MEDICATED_PATCH | Freq: Every day | TRANSDERMAL | Status: DC
  Administered 2014-05-02 – 2014-05-03 (×3): 21 mg via TRANSDERMAL
  Filled 2014-05-02 (×2): qty 1

## 2014-05-02 MED ORDER — MORPHINE SULFATE 2 MG/ML IJ SOLN
2.0000 mg | INTRAMUSCULAR | Status: DC | PRN
  Administered 2014-05-02 – 2014-05-03 (×7): 2 mg via INTRAVENOUS
  Filled 2014-05-02 (×7): qty 1

## 2014-05-02 MED ORDER — ACETAMINOPHEN 325 MG PO TABS: 650.0000 mg | ORAL_TABLET | Freq: Four times a day (QID) | ORAL | Status: DC | PRN

## 2014-05-02 MED ORDER — ACETAMINOPHEN 650 MG RE SUPP: 650.0000 mg | Freq: Four times a day (QID) | RECTAL | Status: DC | PRN

## 2014-05-02 MED ORDER — ONDANSETRON HCL 4 MG PO TABS: 4.0000 mg | ORAL_TABLET | Freq: Four times a day (QID) | ORAL | Status: DC | PRN

## 2014-05-02 NOTE — Progress Notes (Signed)
PROGRESS NOTE  Brent [name] ZOX:[id number] DOB: [birth year] DOA: 05/01/2014 PCP: Milana Obey, MD  Summary: 44 year old man presented with history of fever and back pain. Imaging revealed obstructive uropathy secondary to UVJ stone. He underwent bilateral double-J stent placement by urology 3/20.  Assessment/Plan: 1. Obstructive uropathy secondary to left UVJ stone with moderate left hydroureternephrosis, s/p double-J stent. 2. UTI with sepsis. Hemodynamics stable.  3. Multiple sclerosis 4. Depression, anxiety 5. Multiple teeth extraction 3/11 was on amoxicillin 6. Tobacco dependence. Recommend cessation. 7. Underweight    Improving, fever trending down, hemodynamics stable.  Continue empiric antibiotics, follow-up culture  F/u urology recommendations  Screen for HIV per CDC guidelines  Possibly home tomorrow  Code Status: full DVT prophylaxis: Lovenox Family Communication: mother at bedside Disposition Plan: home  Brendia Sacks, MD  [facility]  Pager [phone] If 7PM-7AM, please contact night-coverage at [email or web address], password [facility] 05/02/2014, 3:51 PM  LOS: 1 day   Consultants:  Urology   Procedures:  3/20 Cystoscopy, bilateral retrograde pyelography, bilateral double-J stent placement   Antibiotics:  Ceftriaxone 3/20 >>  HPI/Subjective: Feels better, pain medication working, poor appetite.  Objective: Filed Vitals:   05/02/14 0217 05/02/14 0550 05/02/14 0900 05/02/14 1335  BP:  107/72 112/72 111/85  Pulse:  84 90 88  Temp: 100.9 F (38.3 C) 98.9 F (37.2 C) 98.6 F (37 C) 98.1 F (36.7 C)  TempSrc: Oral Oral    Resp:  Height:      Weight:  50.2 kg (110 lb 10.7 oz)    SpO2:  98% 97% 96%    Intake/Output Summary (Last 24 hours) at 05/02/14 1551 Last data filed at 05/02/14 1300  Gross per 24 hour  Intake 1883.33 ml  Output   2950 ml  Net -1066.67 ml     Filed Weights   05/01/14 1910 05/02/14 0022 05/02/14 0550   Weight: 50.349 kg (111 lb) 49.896 kg (110 lb) 50.2 kg (110 lb 10.7 oz)    Exam:     Afebrile now, fever up to 102.1, vital signs stable. No hypoxia. General:  Appears calm and comfortable Cardiovascular: RRR, no m/r/g.  Respiratory: CTA bilaterally, no w/r/r. Normal respiratory effort. Psychiatric: grossly normal mood and affect, speech fluent and appropriate  Data Reviewed:  Urine output 2250   Basic metabolic panel unremarkable  CBC unremarkable  Urine culture pending  Chest x-ray showed emphysematous changes  CT abdomen and pelvis showed obstructing 5 mm left UVJ calculus causing moderate left hydroureteronephrosis  Scheduled Meds: . ALPRAZolam  1 mg Oral QID  . antiseptic oral rinse  7 mL Mouth Rinse BID  . cefTRIAXone (ROCEPHIN)  IV  1 g Intravenous Q24H  . cholecalciferol  5,000 Units Oral Daily  . enoxaparin (LOVENOX) injection  40 mg Subcutaneous Q24H  . feeding supplement (ENSURE COMPLETE)  237 mL Oral BID BM  . fluticasone  1 spray Each Nare Daily  . multivitamin with minerals  1 tablet Oral Daily  . nicotine  21 mg Transdermal Daily  . pantoprazole  80 mg Oral Daily  . zolpidem  10 mg Oral QHS   Continuous Infusions: . sodium chloride    . dextrose 5 % and 0.45 % NaCl with KCl 20 mEq/L 100 mL/hr at 05/02/14 8295    Principal Problem:   Sepsis Active Problems:   Ureterolithiasis   UTI (urinary tract infection)   Hydronephrosis   Tobacco abuse   Ureteral calculi   Time spent 20 minutes

## 2014-05-02 NOTE — Anesthesia Postprocedure Evaluation (Signed)
  Anesthesia Post-op Note  Patient: Brent [name]  Procedure(s) Performed: Procedure(s): CYSTOSCOPY WITH BILATERAL RETROGRADE PYELOGRAM; BILATERAL URETERAL STENT PLACEMENT (Bilateral)  Patient Location: Nursing Unit  Anesthesia Type:General  Level of Consciousness: awake, alert  and patient cooperative  Airway and Oxygen Therapy: Patient Spontanous Breathing  Post-op Pain: mild  Post-op Assessment: Post-op Vital signs reviewed, Patient's Cardiovascular Status Stable, Respiratory Function Stable and Patent Airway  Post-op Vital Signs: Reviewed and stable  Last Vitals:  Filed Vitals:   05/02/14 0900  BP: 112/72  Pulse: 90  Temp: 37 C  Resp: 16    Complications: No apparent anesthesia complications

## 2014-05-02 NOTE — Anesthesia Postprocedure Evaluation (Addendum)
  Anesthesia Post-op Note  Patient: Brent [name]  Procedure(s) Performed: Procedure(s): CYSTOSCOPY WITH BILATERAL RETROGRADE PYELOGRAM; BILATERAL URETERAL STENT PLACEMENT (Bilateral)  Patient Location: PACU  Anesthesia Type:General  Level of Consciousness: awake, alert  and patient cooperative  Airway and Oxygen Therapy: Patient Spontanous Breathing and Patient connected to nasal cannula oxygen  Post-op Pain: mild  Post-op Assessment: Post-op Vital signs reviewed, Patient's Cardiovascular Status Stable, Respiratory Function Stable, Patent Airway and No signs of Nausea or vomiting  Post-op Vital Signs: Reviewed and stable  Last Vitals:  Filed Vitals:   05/02/14 0000  BP: 112/73  Pulse: 105  Temp:   Resp: 13    Complications: No apparent anesthesia complications. Eye glasses with Patient on discharge from PACU.

## 2014-05-02 NOTE — Progress Notes (Signed)
UR completed 

## 2014-05-02 NOTE — Addendum Note (Signed)
Addendum  created 05/02/14 1318 by Franco Nones, CRNA   Modules edited: Notes Section   Notes Section:  File: [id number]

## 2014-05-02 NOTE — Progress Notes (Signed)
INITIAL NUTRITION ASSESSMENT  DOCUMENTATION CODES Per approved criteria  -Underweight   INTERVENTION: -Snacks per pt request  -Ensure Enlive po BID, each supplement provides 350 kcal and 20 grams of protein   NUTRITION DIAGNOSIS: Increased protein needs related to tissue healing as evidenced by post op day 1.   Goal: Pt to meet >/= 90% of their estimated nutrition needs    Monitor:  Oral intake, weight, snack preferences, labs, incision healing  Reason for Assessment: MST 3/Low BMI  44 y.o. male  Admitting Dx: <principal problem not specified>  ASSESSMENT: 43 y.o.male with hx of multiple sclerosis, alcoholic pancreatitis, depression, anxiety. He has been having generalized abdominal pain with difficulty urinating and pain with urinating. CT shows obstructing calculi s/p surgery 3/20.  Pt was slightly upset when I saw him, unsure why. No nfpe  Patient states he has had poor oral intake for 1 month due to n/v, pain, and stress that accompanied the pain. He does not have a normal weigh and runs anywhere from 115-135.  Pt was interested in snacks.    Nutrition Focused Physical Exam: Patient deferred/not appropriate  Height: Ht Readings from Last 1 Encounters:  05/02/14 5\' 7"  (1.702 m)    Weight: Wt Readings from Last 1 Encounters:  05/02/14 110 lb 10.7 oz (50.2 kg)    Ideal Body Weight: 148 lbs  % Ideal Body Weight: 74%  Wt Readings from Last 10 Encounters:  05/02/14 110 lb 10.7 oz (50.2 kg)  01/16/14 112 lb 4.8 oz (50.939 kg)  11/03/12 143 lb (64.864 kg)  10/08/11 115 lb (52.164 kg)  08/31/11 113 lb 8 oz (51.483 kg)  09/24/10 125 lb (56.7 kg)  05/04/10 116 lb (52.617 kg)  Wt loss 4% last month- not significant   Usual Body Weight: 115-135  % Usual Body Weight: 96%  BMI:  Body mass index is 17.33 kg/(m^2).  Estimated Nutritional Needs: Kcal: 1750-2000 (35-40 kcal/kg) Protein: 60-70 Fluid: 1.8-2 liters  Skin: surgical incision  Diet Order:  Diet Carb Modified  EDUCATION NEEDS: -No education needs identified at this time   Intake/Output Summary (Last 24 hours) at 05/02/14 1104 Last data filed at 05/02/14 0700  Gross per 24 hour  Intake 1403.33 ml  Output   2250 ml  Net -846.67 ml    Last BM: 3/20  Labs:   Recent Labs Lab 05/01/14 1941 05/02/14 0425  NA 133* 134*  K 3.4* 4.0  CL 100 105  CO2 24 20  BUN 13 8  CREATININE 0.78 0.84  CALCIUM 8.4 8.3*  GLUCOSE 79 217*    CBG (last 3)  No results for input(s): GLUCAP in the last 72 hours.  Scheduled Meds: . ALPRAZolam  1 mg Oral QID  . antiseptic oral rinse  7 mL Mouth Rinse BID  . cefTRIAXone (ROCEPHIN)  IV  1 g Intravenous Q24H  . cholecalciferol  5,000 Units Oral Daily  . enoxaparin (LOVENOX) injection  40 mg Subcutaneous Q24H  . feeding supplement (ENSURE COMPLETE)  237 mL Oral BID BM  . fluticasone  1 spray Each Nare Daily  . multivitamin with minerals  1 tablet Oral Daily  . nicotine  21 mg Transdermal Daily  . pantoprazole  80 mg Oral Daily  . zolpidem  10 mg Oral QHS    Continuous Infusions: . sodium chloride    . dextrose 5 % and 0.45 % NaCl with KCl 20 mEq/L 100 mL/hr at 05/02/14 4580    Past Medical History  Diagnosis Date  .  Condylomata acuminata in male     multiple procedures  . GERD (gastroesophageal reflux disease)   . Chronic back pain   . Pancreatitis     H/O  . Suicidal ideation     Admission in 2006 for suicidal ideation without attempt  . Anxiety     Disabled due to panic attacks  . Pancreatitis, alcoholic     admission 08/2011  . Multiple sclerosis     pt. states has 4 small brain lesions  . Shortness of breath     pt. states with exertion & at night  . Diabetes mellitus without complication     borederline  . Headache(784.0)   . Arthritis   . Depression   . Eye inflammation     Past Surgical History  Procedure Laterality Date  . Egd with ed by dr. Karilyn Cota  6/06    erosive RE with small sliding hh. HP neg,  15F  . Tcs by dr. Karilyn Cota  12/05    External hemorrhoids  . Electrocautery/dessication of condyloma of penis and perianal area  2009/2011    Dr. Leticia Penna  . Colonoscopy  10/08/2011    Jenkins:Normal colon/Anal condyloma without extension proximal to dentate line  . Flexible bronchoscopy N/A 08/12/2012    Procedure: FLEXIBLE BRONCHOSCOPY;  Surgeon: Fredirick Maudlin, MD;  Location: [facility];  Service: Pulmonary;  Laterality: N/A;  . Esophagogastroduodenoscopy (egd) with propofol N/A 11/12/2012    WUJ:WJXBJY esophagus s/p  passage of a Maloney dilator and biopsy. Abnormal gastric mucosa-status post biopsy  . Maloney dilation N/A 11/12/2012    Procedure: MALONEY DILATION (54mm);  Surgeon: Corbin Ade, MD;  Location: [facility];  Service: Endoscopy;  Laterality: N/A;  . Esophageal biopsy  11/12/2012    Procedure: GASTRIC AND ESOPHAGEAL BIOPSIES;  Surgeon: Corbin Ade, MD;  Location: [facility];  Service: Endoscopy;;    Christophe Louis RD, LDN Nutrition Pager: [phone] 05/02/2014 11:04 AM

## 2014-05-02 NOTE — Progress Notes (Signed)
Foley catheter removed per orders.Patient tolerated well. Urinal placed at bedside, within reach. Will continue to monitor.

## 2014-05-03 ENCOUNTER — Encounter (HOSPITAL_COMMUNITY): Payer: Self-pay | Admitting: Urology

## 2014-05-03 DIAGNOSIS — N390 Urinary tract infection, site not specified: Secondary | ICD-10-CM

## 2014-05-03 DIAGNOSIS — N201 Calculus of ureter: Secondary | ICD-10-CM | POA: Insufficient documentation

## 2014-05-03 DIAGNOSIS — R1032 Left lower quadrant pain: Secondary | ICD-10-CM

## 2014-05-03 LAB — URINE CULTURE: Colony Count: 3000

## 2014-05-03 MED ORDER — POLYETHYLENE GLYCOL 3350 17 G PO PACK
17.0000 g | PACK | Freq: Every day | ORAL | Status: DC
  Administered 2014-05-03: 17 g via ORAL
  Filled 2014-05-03 (×2): qty 1

## 2014-05-03 MED ORDER — POLYETHYLENE GLYCOL 3350 17 G PO PACK: 17.0000 g | PACK | Freq: Every day | ORAL | Status: DC

## 2014-05-03 MED ORDER — PROMETHAZINE HCL 25 MG PO TABS: 25.0000 mg | ORAL_TABLET | Freq: Four times a day (QID) | ORAL | Status: DC | PRN

## 2014-05-03 MED ORDER — CIPROFLOXACIN HCL 500 MG PO TABS: 500.0000 mg | ORAL_TABLET | Freq: Two times a day (BID) | ORAL | Status: DC

## 2014-05-03 MED ORDER — HYDROCODONE-ACETAMINOPHEN 10-325 MG PO TABS: 1.0000 | ORAL_TABLET | Freq: Four times a day (QID) | ORAL | Status: DC | PRN

## 2014-05-03 NOTE — Discharge Summary (Signed)
Physician Discharge Summary  Brent [name]:[id number] DOB: 16-Jan-1971 DOA: 05/01/2014  PCP: Milana Obey, MD  Admit date: 05/01/2014 Discharge date: 05/03/2014  Time spent: 40 minutes  Recommendations for Outpatient Follow-up:  1. Follow-up with urology. Urology office will schedule appointment  Discharge Diagnoses:  Principal Problem:   Sepsis Active Problems:   Ureterolithiasis   UTI (urinary tract infection)   Hydronephrosis   Tobacco abuse   Ureteral calculi   Bilateral ureteral calculi   UTI (lower urinary tract infection)   Discharge Condition: improved  Diet recommendation: heart healthy  Filed Weights   05/02/14 0022 05/02/14 0550 05/03/14 0546  Weight: 49.896 kg (110 lb) 50.2 kg (110 lb 10.7 oz) 48.036 kg (105 lb 14.4 oz)    History of present illness:  44 year old male presents to the hospital with history of fever and back pain. Imaging revealed obstructive uropathy secondary to UVJ stone. Patient was started on intravenous antibiotics and admitted to the hospital for further treatment.  Hospital Course:  Patient was seen in consultation by urology and underwent a lateral double J stent placement. He was continued on antibiotics. Urine culture is positive for Escherichia coli with sensitivities pending. He has been afebrile and blood work is otherwise unremarkable. We'll transition him to empiric treatment with oral ciprofloxacin. He is continued on pain management. He is tolerating a solid diet. He will plan to follow-up with urology in the next 1 week.  Procedures:  3/20 Cystoscopy, bilateral retrograde pyelography, bilateral double-J stent placement  Consultations:  Urology  Discharge Exam: Filed Vitals:   05/03/14 0546  BP: 119/79  Pulse: 89  Temp: 99.3 F (37.4 C)  Resp: 16    General: NAD Cardiovascular: S1 S2 RRR Respiratory: CTA B  Discharge Instructions   Discharge Instructions    Call MD for:  persistant nausea and  vomiting    Complete by:  As directed      Call MD for:  severe uncontrolled pain    Complete by:  As directed      Call MD for:  temperature >100.4    Complete by:  As directed      Diet - low sodium heart healthy    Complete by:  As directed      Increase activity slowly    Complete by:  As directed           Discharge Medication List as of 05/03/2014  2:11 PM    START taking these medications   Details  ciprofloxacin (CIPRO) 500 MG tablet Take 1 tablet (500 mg total) by mouth 2 (two) times daily., Starting 05/03/2014, Until Discontinued, Print    polyethylene glycol (MIRALAX / GLYCOLAX) packet Take 17 g by mouth daily., Starting 05/03/2014, Until Discontinued, Print      CONTINUE these medications which have CHANGED   Details  HYDROcodone-acetaminophen (NORCO) 10-325 MG per tablet Take 1-2 tablets by mouth every 6 (six) hours as needed for moderate pain., Starting 05/03/2014, Until Discontinued, Print    promethazine (PHENERGAN) 25 MG tablet Take 1 tablet (25 mg total) by mouth every 6 (six) hours as needed., Starting 05/03/2014, Until Discontinued, Print      CONTINUE these medications which have NOT CHANGED   Details  ALPRAZolam (XANAX) 0.5 MG tablet Take 1 mg by mouth 4 (four) times daily. For anxiety, Starting 05/03/2010, Until Discontinued, Historical Med    calcium carbonate (OS-CAL) 600 MG TABS tablet Take 600 mg by mouth daily with breakfast., Until Discontinued, Historical Med  Cholecalciferol (VITAMIN D3) 5000 UNITS CAPS Take 1 capsule by mouth daily., Until Discontinued, Historical Med    Cyanocobalamin (B-12 PO) Take 1 tablet by mouth daily., Until Discontinued, Historical Med    fluticasone (FLONASE) 50 MCG/ACT nasal spray Place 1 spray into both nostrils daily., Until Discontinued, Historical Med    hydroxypropyl methylcellulose / hypromellose (ISOPTO TEARS / GONIOVISC) 2.5 % ophthalmic solution Place 1 drop into both eyes as needed for dry eyes., Until  Discontinued, Historical Med    Multiple Vitamin (MULTIVITAMIN) tablet Take 1 tablet by mouth daily., Until Discontinued, Historical Med    omeprazole (PRILOSEC) 40 MG capsule Take 40 mg by mouth daily.  , Until Discontinued, Historical Med    zolpidem (AMBIEN) 10 MG tablet Take 10 mg by mouth at bedtime. , Until Discontinued, Historical Med      STOP taking these medications     Aspirin-Salicylamide-Caffeine (BC HEADACHE POWDER PO)      predniSONE (DELTASONE) 50 MG tablet        No Known Allergies Follow-up Information    Follow up with GRAPEY,DAVID S, MD In 1 week.   Specialty:  Urology   Why:  office will call with appointment   Contact information:   [address] [place] [address] [phone]        The results of significant diagnostics from this hospitalization (including imaging, microbiology, ancillary and laboratory) are listed below for reference.    Significant Diagnostic Studies: Dg Retrograde Pyelogram  05/01/2014   CLINICAL DATA:  Febrile urinary tract infection.  EXAM: RETROGRADE PYELOGRAM  COMPARISON:  CT 05/01/2014  FINDINGS: Saved fluoroscopic images demonstrate retrograde contrast opacification of the left ureter and renal collecting system, followed by instrumentation. Right ureter and collecting system were subsequently opacified. Filling defects are present in both ureters consistent with ureterolithiasis.  IMPRESSION: Bilateral ureterolithiasis   Electronically Signed   By: Ellery Plunk M.D.   On: 05/01/2014 23:53   Ct Renal Stone Study  05/01/2014   CLINICAL DATA:  44 year old male with left flank, abdominal and pelvic pain for 3 days. Fever.  EXAM: CT ABDOMEN AND PELVIS WITHOUT CONTRAST  TECHNIQUE: Multidetector CT imaging of the abdomen and pelvis was performed following the standard protocol without IV contrast.  COMPARISON:  08/31/2011 and prior CTs  FINDINGS: Please note that parenchymal abnormalities may be missed without intravenous  contrast.  Lower chest:  Unremarkable  Hepatobiliary: The liver and gallbladder are unremarkable. There is no evidence of biliary dilatation.  Pancreas: Unremarkable  Spleen: Unremarkable  Adrenals/Urinary Tract: A 5 mm calculus at left UVJ causes moderate left hydroureteronephrosis. Two adjacent 3 mm distal left ureteral calculi and 4 mm proximal-mid left ureteral calculus are also identified.  Two adjacent distal right ureteral calculi are identified measuring 3 mm and 4 mm, nonobstructing.  A 3 mm nonobstructing mid left renal calculus and 2 separate 4 mm nonobstructing mid right renal calculi are identified. Other punctate nonobstructing right lower calculi noted.  The adrenal glands are unremarkable.  Stomach/Bowel: No bowel obstruction or definite bowel wall thickening identified.  Vascular/Lymphatic: No enlarged lymph nodes or abdominal aortic aneurysm.  Reproductive: Prostate enlargement noted.  Other: No free fluid or pneumoperitoneum.  Musculoskeletal: A remote L2 superior endplate compression fracture is noted. No acute bony abnormalities a are identified. Moderate degenerative disc disease at L4-5 and L5-S1 noted.  IMPRESSION: Obstructing 5 mm left UVJ calculus causing moderate left hydroureteronephrosis.  Other nonobstructing bilateral ureteral calculi including two adjacent 3 mm distal left  ureteral calculi, a 4 mm proximal -mid left ureteral calculus, and adjacent 3 mm and 4 mm distal right ureteral calculi.  Nonobstructing bilateral renal calculi.  Prostate enlargement.  Remote L2 superior endplate compression fracture.   Electronically Signed   By: Harmon Pier M.D.   On: 05/01/2014 20:43    Microbiology: Recent Results (from the past 240 hour(s))  Urine culture     Status: None (Preliminary result)   Collection Time: 05/01/14  8:07 PM  Result Value Ref Range Status   Specimen Description URINE, CLEAN CATCH  Final   Special Requests NONE  Final   Colony Count   Final    >=100,000  COLONIES/ML Performed at [facility]    Culture   Final    ESCHERICHIA COLI Performed at [facility]    Report Status PENDING  Incomplete  Urine culture     Status: None   Collection Time: 05/02/14  8:00 AM  Result Value Ref Range Status   Specimen Description URINE, CLEAN CATCH  Final   Special Requests NONE  Final   Colony Count   Final    3,000 COLONIES/ML Performed at [facility]    Culture   Final    INSIGNIFICANT GROWTH Performed at [facility]    Report Status 05/03/2014 FINAL  Final     Labs: Basic Metabolic Panel:  Recent Labs Lab 05/01/14 1941 05/02/14 0425  NA 133* 134*  K 3.4* 4.0  CL 100 105  CO2 24 20  GLUCOSE 79 217*  BUN 13 8  CREATININE 0.78 0.84  CALCIUM 8.4 8.3*   Liver Function Tests: No results for input(s): AST, ALT, ALKPHOS, BILITOT, PROT, ALBUMIN in the last 168 hours. No results for input(s): LIPASE, AMYLASE in the last 168 hours. No results for input(s): AMMONIA in the last 168 hours. CBC:  Recent Labs Lab 05/01/14 1941 05/02/14 0425  WBC 10.5 10.3  NEUTROABS 7.2  --   HGB 13.6 12.7*  HCT 40.3 39.2  MCV 96.2 96.1  PLT 413* 431*   Cardiac Enzymes: No results for input(s): CKTOTAL, CKMB, CKMBINDEX, TROPONINI in the last 168 hours. BNP: BNP (last 3 results) No results for input(s): BNP in the last 8760 hours.  ProBNP (last 3 results) No results for input(s): PROBNP in the last 8760 hours.  CBG: No results for input(s): GLUCAP in the last 168 hours.     Signed:  MEMON,JEHANZEB  [facility] 05/03/2014, 6:50 PM

## 2014-05-03 NOTE — Care Management Note (Signed)
    Page 1 of 1   05/03/2014     11:10:29 AM CARE MANAGEMENT NOTE 05/03/2014  Patient:  JIMI, VERHOEVEN   Account Number:  [id number]  Date Initiated:  05/03/2014  Documentation initiated by:  Sharrie Rothman  Subjective/Objective Assessment:   Pt admitted from home with UTI and obstructive uropathy. Pt lives with family and will return home at discharge.Pt is independent with ADL's.     Action/Plan:   No CM needs noted. Bedside RN to arrange followup appts and document on AVS.   Anticipated DC Date:  05/03/2014   Anticipated DC Plan:  HOME/SELF CARE      DC Planning Services  CM consult      Choice offered to / List presented to:             Status of service:  Completed, signed off Medicare Important Message given?  NA - LOS <3 / Initial given by admissions (If response is "NO", the following Medicare IM given date fields will be blank) Date Medicare IM given:   Medicare IM given by:   Date Additional Medicare IM given:   Additional Medicare IM given by:    Discharge Disposition:  HOME/SELF CARE  Per UR Regulation:    If discussed at Long Length of Stay Meetings, dates discussed:    Comments:  05/03/14 11105 Arlyss Queen, RN BSN CM

## 2014-05-03 NOTE — Progress Notes (Signed)
2 Days Post-Op Subjective: Patient reports some dysuria and frequency, seems stent related  Objective: Vital signs in last 24 hours: Temp:  [98.1 F (36.7 C)-99.9 F (37.7 C)] 99.3 F (37.4 C) (03/22 0546) Pulse Rate:  [88-96] 89 (03/22 0546) Resp:  [16] 16 (03/22 0546) BP: (111-119)/(75-85) 119/79 mmHg (03/22 0546) SpO2:  [96 %-99 %] 96 % (03/22 0546) Weight:  [48.036 kg (105 lb 14.4 oz)] 48.036 kg (105 lb 14.4 oz) (03/22 0546)  Intake/Output from previous day: 03/21 0701 - 03/22 0700 In: 960 [P.O.:960] Out: 1900 [Urine:1900] Intake/Output this shift:    Physical Exam:  Constitutional: Vital signs reviewed. WD WN in NAD   Eyes: PERRL, No scleral icterus.   Pulmonary/Chest: Normal effort  Urine tea colored w/o clots Lab Results:  Recent Labs  05/01/14 1941 05/02/14 0425  HGB 13.6 12.7*  HCT 40.3 39.2   BMET  Recent Labs  05/01/14 1941 05/02/14 0425  NA 133* 134*  K 3.4* 4.0  CL 100 105  CO2 24 20  GLUCOSE 79 217*  BUN 13 8  CREATININE 0.78 0.84  CALCIUM 8.4 8.3*   No results for input(s): LABPT, INR in the last 72 hours. No results for input(s): LABURIN in the last 72 hours. Results for orders placed or performed during the hospital encounter of 05/01/14  Urine culture     Status: None (Preliminary result)   Collection Time: 05/01/14  8:07 PM  Result Value Ref Range Status   Specimen Description URINE, CLEAN CATCH  Final   Special Requests NONE  Final   Colony Count   Final    >=100,000 COLONIES/ML Performed at [facility]    Culture   Final    ESCHERICHIA COLI Performed at [facility]    Report Status PENDING  Incomplete    Studies/Results: Dg Retrograde Pyelogram  05/01/2014   CLINICAL DATA:  Febrile urinary tract infection.  EXAM: RETROGRADE PYELOGRAM  COMPARISON:  CT 05/01/2014  FINDINGS: Saved fluoroscopic images demonstrate retrograde contrast opacification of the left ureter and renal collecting system, followed by  instrumentation. Right ureter and collecting system were subsequently opacified. Filling defects are present in both ureters consistent with ureterolithiasis.  IMPRESSION: Bilateral ureterolithiasis   Electronically Signed   By: Ellery Plunk M.D.   On: 05/01/2014 23:53   Ct Renal Stone Study  05/01/2014   CLINICAL DATA:  44 year old male with left flank, abdominal and pelvic pain for 3 days. Fever.  EXAM: CT ABDOMEN AND PELVIS WITHOUT CONTRAST  TECHNIQUE: Multidetector CT imaging of the abdomen and pelvis was performed following the standard protocol without IV contrast.  COMPARISON:  08/31/2011 and prior CTs  FINDINGS: Please note that parenchymal abnormalities may be missed without intravenous contrast.  Lower chest:  Unremarkable  Hepatobiliary: The liver and gallbladder are unremarkable. There is no evidence of biliary dilatation.  Pancreas: Unremarkable  Spleen: Unremarkable  Adrenals/Urinary Tract: A 5 mm calculus at left UVJ causes moderate left hydroureteronephrosis. Two adjacent 3 mm distal left ureteral calculi and 4 mm proximal-mid left ureteral calculus are also identified.  Two adjacent distal right ureteral calculi are identified measuring 3 mm and 4 mm, nonobstructing.  A 3 mm nonobstructing mid left renal calculus and 2 separate 4 mm nonobstructing mid right renal calculi are identified. Other punctate nonobstructing right lower calculi noted.  The adrenal glands are unremarkable.  Stomach/Bowel: No bowel obstruction or definite bowel wall thickening identified.  Vascular/Lymphatic: No enlarged lymph nodes or abdominal aortic aneurysm.  Reproductive:  Prostate enlargement noted.  Other: No free fluid or pneumoperitoneum.  Musculoskeletal: A remote L2 superior endplate compression fracture is noted. No acute bony abnormalities a are identified. Moderate degenerative disc disease at L4-5 and L5-S1 noted.  IMPRESSION: Obstructing 5 mm left UVJ calculus causing moderate left hydroureteronephrosis.   Other nonobstructing bilateral ureteral calculi including two adjacent 3 mm distal left ureteral calculi, a 4 mm proximal -mid left ureteral calculus, and adjacent 3 mm and 4 mm distal right ureteral calculi.  Nonobstructing bilateral renal calculi.  Prostate enlargement.  Remote L2 superior endplate compression fracture.   Electronically Signed   By: Harmon Pier M.D.   On: 05/01/2014 20:43    Assessment/Plan:   S/p bilat dstent placement for obstructing/infected stones. Doing well  Treat uti appropriately, followup w/ Dr Isabel Caprice  rec prn oxybutynin for stent sx's   LOS: 2 days   Marcine Matar M 05/03/2014, 11:54 AM

## 2014-05-05 LAB — URINE CULTURE: Colony Count: >=100000

## 2014-05-06 ENCOUNTER — Encounter (HOSPITAL_COMMUNITY): Payer: Self-pay | Admitting: Urology

## 2014-05-24 ENCOUNTER — Ambulatory Visit (INDEPENDENT_AMBULATORY_CARE_PROVIDER_SITE_OTHER): Payer: Medicare Other | Admitting: Urology

## 2014-05-24 DIAGNOSIS — N201 Calculus of ureter: Secondary | ICD-10-CM

## 2014-05-24 DIAGNOSIS — N12 Tubulo-interstitial nephritis, not specified as acute or chronic: Secondary | ICD-10-CM

## 2014-05-25 ENCOUNTER — Other Ambulatory Visit: Payer: Self-pay | Admitting: Urology

## 2014-06-02 ENCOUNTER — Encounter (HOSPITAL_BASED_OUTPATIENT_CLINIC_OR_DEPARTMENT_OTHER): Payer: Self-pay | Admitting: *Deleted

## 2014-06-03 NOTE — Progress Notes (Signed)
Unable to reach pt and obtain hx. Left message for pt to be npo p mn Sunday, including no candy , mint, or gum.  No water.  To [facility] [address] 4/25 @ 0715.  Needs istat on arrival.

## 2014-06-05 NOTE — H&P (Signed)
Urology History and Physical Exam  CC: Kidney stones  HPI: 44 year old male presents at this time for cystoscopy, bilateral ureteroscopy with holmium laser and extraction of bilateral ureteral and renal calculi. He underwent urgent bilateral ureteral stenting on 05/01/2014 by Dr. Isabel Caprice at [facility] at the time of presentation with lateral flank pain and urinary tract infection/fever. Following urgent stenting, I saw him in follow-up. He had been adequately treated for infection at that point his infection was cleared. At this point, he presents for definitive ureteroscopy and management of his large stone burden-at least 2-3 stones in each ureter as well as 1-2 stones in each kidney. Ureteroscopy, risks and complications were discussed with the patient. He understands the procedure and desires to proceed  PMH: Past Medical History  Diagnosis Date  . Retinal vasculitis   . Condylomata acuminata in male     multiple procedures --  penile, peri-rectal , perineum  . GERD (gastroesophageal reflux disease)   . Chronic back pain   . Multiple sclerosis     pt. states has 4 small brain lesions  . Arthritis   . Depression   . Hypertension     was on medication for short time, htn was caused by prednisone per pt  . Anxiety     Disabled due to panic attacks  . Headache(784.0)   . Fibromyalgia   . Emphysematous COPD   . Dyspnea on exertion   . Borderline type 2 diabetes mellitus   . History of suicidal ideation     2006   adx  . History of panic attacks   . History of esophageal dilatation   . History of chronic pancreatitis     severeal adx for this /  2008  dx alcoholic pancreatitis  . Bilateral ureteral calculi   . Nephrolithiasis     bilateral   . History of kidney stones   . History of sepsis     adx 05-01-2014--  urosepsis due to kidney stones obstruction/ hydronephrosis  . BPH (benign prostatic hypertrophy)   . DDD (degenerative disc disease), cervical   . History of  hiatal hernia     PSH: Past Surgical History  Procedure Laterality Date  . Wisdom tooth extraction    . Multiple extractions with alveoloplasty N/A 04/22/2014    Procedure: MULTIPLE EXTRACTIONS ( 2,3,5,6,7,8,9,10,11,13,14,15,21,28  WITH ALVEOLOPLASTY;  Surgeon: Ocie Doyne, DDS;  Location: [facility] OR;  Service: Oral Surgery;  Laterality: N/A;  . Colonoscopy  10/08/2011    Jenkins:Normal colon/Anal condyloma without extension proximal to dentate line  . Flexible bronchoscopy N/A 08/12/2012    Procedure: FLEXIBLE BRONCHOSCOPY;  Surgeon: Fredirick Maudlin, MD;  Location: [facility]S;  Service: Pulmonary;  Laterality: N/A;  . 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 Abnormal gastric mucosa-status post biopsy  . Maloney dilation N/A 11/12/2012    Procedure: MALONEY DILATION (54mm);  Surgeon: Corbin Ade, MD;  Location: [facility];  Service: Endoscopy;  Laterality: N/A;  . Esophageal biopsy  11/12/2012    Procedure: GASTRIC AND ESOPHAGEAL BIOPSIES;  Surgeon: Corbin Ade, MD;  Location: [facility];  Service: Endoscopy;;  . Cystoscopy w/ ureteral stent placement Bilateral 05/01/2014    Procedure: CYSTOSCOPY WITH BILATERAL RETROGRADE PYELOGRAM; BILATERAL URETERAL STENT PLACEMENT;  Surgeon: Barron Alvine, MD;  Location: [facility];  Service: Urology;  Laterality: Bilateral;  . Electrocautery/ desiccation of condyloma lesions  01-15-2008  &  12-29-2009  PENIS, PERI-RECTAL , PERINUEM  . Cataract extraction w/ intraocular lens  implant, bilateral      Allergies: No Known Allergies  Medications: No prescriptions prior to admission     Social History: History   Social History  . Marital Status: Single    Spouse Name: N/A  . Number of Children: 0  . Years of Education: N/A   Occupational History  . disabled     anxiety   Social History Main Topics  . Smoking status: Current Every Day Smoker -- 1.00 packs/day for 20  years    Types: Cigarettes  . Smokeless tobacco: Not on file  . Alcohol Use: No     Comment:  none since 08/2011  . Drug Use: No  . Sexual Activity: Not on file   Other Topics Concern  . Not on file   Social History Narrative   ** Merged History Encounter **        Family History: Family History  Problem Relation Age of Onset  . Hypertension Mother   . Breast cancer Mother   . Melanoma Mother   . Stroke Mother   . Lung cancer Father   . Lung cancer Father 32  . Colon cancer Neg Hx   . Colitis Neg Hx   . Cirrhosis Neg Hx   . Liver disease Neg Hx   . Pancreatic cancer Neg Hx   . Pancreatitis Neg Hx   . Anesthesia problems Neg Hx   . Hypotension Neg Hx   . Malignant hyperthermia Neg Hx   . Pseudochol deficiency Neg Hx     Review of Systems: Positive: Urinary frequency, gross hematuria Negative:  A further 10 point review of systems was negative except what is listed in the HPI.                  Physical Exam: @ General: No acute distress.  Awake. Head:  Normocephalic.  Atraumatic. ENT:  EOMI.  Mucous membranes moist Neck:  Supple.  No lymphadenopathy. CV:  S1 present. S2 present. Regular rate. Pulmonary: Equal effort bilaterally.  Clear to auscultation bilaterally. Abdomen: Soft.  Non- tender to palpation. Skin:  Normal turgor.  No visible rash. Extremity: No gross deformity of bilateral upper extremities.  No gross deformity of                             lower extremities. Neurologic: Alert. Appropriate mood.   Studies:  No results for input(s): HGB, WBC, PLT in the last 72 hours.  No results for input(s): NA, K, CL, CO2, BUN, CREATININE, CALCIUM, GFRNONAA, GFRAA in the last 72 hours.  Invalid input(s): MAGNESIUM   No results for input(s): INR, APTT in the last 72 hours.  Invalid input(s): PT   Invalid input(s): ABG    Assessment:   bilateral ureteral and renal calculi, status post bilateral stenting  Plan:  cystoscopy, bilateral stent  extraction, bilateral ureteroscopy with holmium laser and extraction of bilateral ureteral renal calculi, double-J stent placement

## 2014-06-06 ENCOUNTER — Encounter (HOSPITAL_BASED_OUTPATIENT_CLINIC_OR_DEPARTMENT_OTHER): Payer: Self-pay | Admitting: Anesthesiology

## 2014-06-06 ENCOUNTER — Encounter (HOSPITAL_BASED_OUTPATIENT_CLINIC_OR_DEPARTMENT_OTHER)
Admission: RE | Disposition: A | Discharge status: Home / Self Care | Payer: Self-pay | Source: Ambulatory Visit | Attending: Urology

## 2014-06-06 ENCOUNTER — Ambulatory Visit (HOSPITAL_BASED_OUTPATIENT_CLINIC_OR_DEPARTMENT_OTHER): Payer: Medicare Other | Admitting: Anesthesiology

## 2014-06-06 ENCOUNTER — Ambulatory Visit (HOSPITAL_BASED_OUTPATIENT_CLINIC_OR_DEPARTMENT_OTHER)
Admission: RE | Admit: 2014-06-06 | Discharge: 2014-06-06 | Disposition: A | Discharge status: Home / Self Care | Payer: Medicare Other | Source: Ambulatory Visit | Attending: Urology | Admitting: Urology

## 2014-06-06 DIAGNOSIS — K861 Other chronic pancreatitis: Secondary | ICD-10-CM | POA: Insufficient documentation

## 2014-06-06 DIAGNOSIS — K219 Gastro-esophageal reflux disease without esophagitis: Secondary | ICD-10-CM | POA: Insufficient documentation

## 2014-06-06 DIAGNOSIS — F17210 Nicotine dependence, cigarettes, uncomplicated: Secondary | ICD-10-CM | POA: Insufficient documentation

## 2014-06-06 DIAGNOSIS — F419 Anxiety disorder, unspecified: Secondary | ICD-10-CM | POA: Diagnosis not present

## 2014-06-06 DIAGNOSIS — K449 Diaphragmatic hernia without obstruction or gangrene: Secondary | ICD-10-CM | POA: Insufficient documentation

## 2014-06-06 DIAGNOSIS — F329 Major depressive disorder, single episode, unspecified: Secondary | ICD-10-CM | POA: Insufficient documentation

## 2014-06-06 DIAGNOSIS — M1990 Unspecified osteoarthritis, unspecified site: Secondary | ICD-10-CM | POA: Diagnosis not present

## 2014-06-06 DIAGNOSIS — J449 Chronic obstructive pulmonary disease, unspecified: Secondary | ICD-10-CM | POA: Insufficient documentation

## 2014-06-06 DIAGNOSIS — N400 Enlarged prostate without lower urinary tract symptoms: Secondary | ICD-10-CM | POA: Diagnosis not present

## 2014-06-06 DIAGNOSIS — G35 Multiple sclerosis: Secondary | ICD-10-CM | POA: Insufficient documentation

## 2014-06-06 DIAGNOSIS — N201 Calculus of ureter: Secondary | ICD-10-CM | POA: Insufficient documentation

## 2014-06-06 DIAGNOSIS — N200 Calculus of kidney: Secondary | ICD-10-CM | POA: Diagnosis present

## 2014-06-06 DIAGNOSIS — M797 Fibromyalgia: Secondary | ICD-10-CM | POA: Insufficient documentation

## 2014-06-06 DIAGNOSIS — I10 Essential (primary) hypertension: Secondary | ICD-10-CM | POA: Diagnosis not present

## 2014-06-06 HISTORY — DX: Personal history of other mental and behavioral disorders: Z86.59

## 2014-06-06 HISTORY — DX: Other forms of dyspnea: R06.09

## 2014-06-06 HISTORY — DX: Benign prostatic hyperplasia without lower urinary tract symptoms: N40.0

## 2014-06-06 HISTORY — PX: CYSTOSCOPY W/ URETERAL STENT REMOVAL: SHX1430

## 2014-06-06 HISTORY — DX: Dyspnea, unspecified: R06.00

## 2014-06-06 HISTORY — DX: Personal history of other infectious and parasitic diseases: Z86.19

## 2014-06-06 HISTORY — DX: Prediabetes: R73.03

## 2014-06-06 HISTORY — DX: Calculus of ureter: N20.1

## 2014-06-06 HISTORY — DX: Other specified postprocedural states: Z98.890

## 2014-06-06 HISTORY — DX: Other cervical disc degeneration, unspecified cervical region: M50.30

## 2014-06-06 HISTORY — PX: CYSTOSCOPY WITH URETEROSCOPY AND STENT PLACEMENT: SHX6377

## 2014-06-06 HISTORY — DX: Personal history of other diseases of the digestive system: Z87.19

## 2014-06-06 HISTORY — PX: CYSTOSCOPY WITH URETEROSCOPY: SHX5123

## 2014-06-06 HISTORY — DX: Emphysema, unspecified: J43.9

## 2014-06-06 HISTORY — DX: Calculus of kidney: N20.0

## 2014-06-06 HISTORY — DX: Personal history of urinary calculi: Z87.442

## 2014-06-06 LAB — POCT I-STAT, CHEM 8
BUN: 16 mg/dL (ref 6–23)
CALCIUM ION: 1.27 mmol/L — AB (ref 1.12–1.23)
Chloride: 97 mmol/L (ref 96–112)
Creatinine, Ser: 0.7 mg/dL (ref 0.50–1.35)
Glucose, Bld: 83 mg/dL (ref 70–99)
HEMATOCRIT: 45 % (ref 39.0–52.0)
HEMOGLOBIN: 15.3 g/dL (ref 13.0–17.0)
POTASSIUM: 3.8 mmol/L (ref 3.5–5.1)
Sodium: 137 mmol/L (ref 135–145)
TCO2: 25 mmol/L (ref 0–100)

## 2014-06-06 SURGERY — CYSTOURETEROSCOPY, WITH STENT INSERTION
Anesthesia: General | Site: Ureter | Laterality: Bilateral

## 2014-06-06 MED ORDER — ONDANSETRON HCL 4 MG/2ML IJ SOLN
INTRAMUSCULAR | Status: DC | PRN
  Administered 2014-06-06: 4 mg via INTRAVENOUS

## 2014-06-06 MED ORDER — HYDROMORPHONE HCL 1 MG/ML IJ SOLN
INTRAMUSCULAR | Status: AC
  Filled 2014-06-06: qty 1

## 2014-06-06 MED ORDER — OXYCODONE HCL 5 MG PO TABS
5.0000 mg | ORAL_TABLET | Freq: Once | ORAL | Status: AC
  Administered 2014-06-06: 5 mg via ORAL
  Filled 2014-06-06: qty 1

## 2014-06-06 MED ORDER — OXYCODONE-ACETAMINOPHEN 5-325 MG PO TABS
ORAL_TABLET | ORAL | Status: AC
  Filled 2014-06-06: qty 1

## 2014-06-06 MED ORDER — HYDROMORPHONE HCL 1 MG/ML IJ SOLN
0.2500 mg | INTRAMUSCULAR | Status: DC | PRN
  Administered 2014-06-06: 0.5 mg via INTRAVENOUS
  Filled 2014-06-06: qty 1

## 2014-06-06 MED ORDER — CEFAZOLIN SODIUM 1-5 GM-% IV SOLN
INTRAVENOUS | Status: AC
  Filled 2014-06-06: qty 100

## 2014-06-06 MED ORDER — LACTATED RINGERS IV SOLN
INTRAVENOUS | Status: DC
  Administered 2014-06-06 (×2): via INTRAVENOUS
  Filled 2014-06-06: qty 1000

## 2014-06-06 MED ORDER — DEXAMETHASONE SODIUM PHOSPHATE 10 MG/ML IJ SOLN
INTRAMUSCULAR | Status: DC | PRN
  Administered 2014-06-06: 10 mg via INTRAVENOUS

## 2014-06-06 MED ORDER — SODIUM CHLORIDE 0.9 % IR SOLN
Status: DC | PRN
  Administered 2014-06-06: 6000 mL

## 2014-06-06 MED ORDER — STERILE WATER FOR IRRIGATION IR SOLN
Status: DC | PRN
  Administered 2014-06-06: 500 mL

## 2014-06-06 MED ORDER — FENTANYL CITRATE (PF) 100 MCG/2ML IJ SOLN
INTRAMUSCULAR | Status: DC | PRN
  Administered 2014-06-06: 50 ug via INTRAVENOUS
  Administered 2014-06-06: 25 ug via INTRAVENOUS
  Administered 2014-06-06: 50 ug via INTRAVENOUS
  Administered 2014-06-06: 25 ug via INTRAVENOUS

## 2014-06-06 MED ORDER — CEFAZOLIN SODIUM-DEXTROSE 2-3 GM-% IV SOLR
2.0000 g | INTRAVENOUS | Status: AC
  Administered 2014-06-06: 2 g via INTRAVENOUS
  Filled 2014-06-06: qty 50

## 2014-06-06 MED ORDER — OXYCODONE HCL 5 MG PO TABS
ORAL_TABLET | ORAL | Status: AC
Start: 2014-06-06 — End: 2014-06-06
  Filled 2014-06-06: qty 1

## 2014-06-06 MED ORDER — PROPOFOL 10 MG/ML IV BOLUS
INTRAVENOUS | Status: DC | PRN
  Administered 2014-06-06: 30 mg via INTRAVENOUS

## 2014-06-06 MED ORDER — CEFAZOLIN SODIUM 1-5 GM-% IV SOLN
1.0000 g | INTRAVENOUS | Status: DC
  Filled 2014-06-06: qty 50

## 2014-06-06 MED ORDER — OXYCODONE-ACETAMINOPHEN 5-325 MG PO TABS
1.0000 | ORAL_TABLET | ORAL | Status: DC | PRN
  Administered 2014-06-06: 1 via ORAL
  Filled 2014-06-06: qty 1

## 2014-06-06 MED ORDER — MIDAZOLAM HCL 2 MG/2ML IJ SOLN
INTRAMUSCULAR | Status: AC
  Filled 2014-06-06: qty 2

## 2014-06-06 MED ORDER — MIDAZOLAM HCL 5 MG/5ML IJ SOLN
INTRAMUSCULAR | Status: DC | PRN
  Administered 2014-06-06: 2 mg via INTRAVENOUS

## 2014-06-06 MED ORDER — LIDOCAINE HCL (CARDIAC) 20 MG/ML IV SOLN
INTRAVENOUS | Status: DC | PRN
  Administered 2014-06-06: 50 mg via INTRAVENOUS

## 2014-06-06 MED ORDER — FENTANYL CITRATE (PF) 100 MCG/2ML IJ SOLN
INTRAMUSCULAR | Status: AC
  Filled 2014-06-06: qty 4

## 2014-06-06 MED ORDER — CIPROFLOXACIN HCL 250 MG PO TABS: 250.0000 mg | ORAL_TABLET | Freq: Two times a day (BID) | ORAL | Status: DC

## 2014-06-06 SURGICAL SUPPLY — 37 items
ADAPTER CATH URET PLST 4-6FR (CATHETERS) IMPLANT
ADPR CATH URET STRL DISP 4-6FR (CATHETERS)
BAG DRAIN URO-CYSTO SKYTR STRL (DRAIN) ×5 IMPLANT
BAG DRN UROCATH (DRAIN) ×3
BASKET LASER NITINOL 1.9FR (BASKET) IMPLANT
BASKET STNLS GEMINI 4WIRE 3FR (BASKET) IMPLANT
BASKET ZERO TIP NITINOL 2.4FR (BASKET) ×3 IMPLANT
BSKT STON RTRVL 120 1.9FR (BASKET)
BSKT STON RTRVL GEM 120X11 3FR (BASKET)
BSKT STON RTRVL ZERO TP 2.4FR (BASKET) ×3
CANISTER SUCT LVC 12 LTR MEDI- (MISCELLANEOUS) ×3 IMPLANT
CATH INTERMIT  6FR 70CM (CATHETERS) ×5 IMPLANT
CATH URET 5FR 28IN CONE TIP (BALLOONS)
CATH URET 5FR 28IN OPEN ENDED (CATHETERS) IMPLANT
CATH URET 5FR 70CM CONE TIP (BALLOONS) IMPLANT
CLOTH BEACON ORANGE TIMEOUT ST (SAFETY) ×5 IMPLANT
ELECT REM PT RETURN 9FT ADLT (ELECTROSURGICAL)
ELECTRODE REM PT RTRN 9FT ADLT (ELECTROSURGICAL) IMPLANT
GLOVE BIO SURGEON STRL SZ 6.5 (GLOVE) ×4 IMPLANT
GLOVE BIO SURGEON STRL SZ8 (GLOVE) ×5 IMPLANT
GLOVE BIO SURGEONS STRL SZ 6.5 (GLOVE) ×2
GLOVE INDICATOR 6.5 STRL GRN (GLOVE) ×6 IMPLANT
GOWN STRL REUS W/ TWL LRG LVL3 (GOWN DISPOSABLE) ×3 IMPLANT
GOWN STRL REUS W/ TWL XL LVL3 (GOWN DISPOSABLE) ×3 IMPLANT
GOWN STRL REUS W/TWL LRG LVL3 (GOWN DISPOSABLE) ×5
GOWN STRL REUS W/TWL XL LVL3 (GOWN DISPOSABLE) ×5
GUIDEWIRE 0.038 PTFE COATED (WIRE) IMPLANT
GUIDEWIRE ANG ZIPWIRE 038X150 (WIRE) IMPLANT
GUIDEWIRE STR DUAL SENSOR (WIRE) ×3 IMPLANT
IV NS IRRIG 3000ML ARTHROMATIC (IV SOLUTION) ×10 IMPLANT
KIT BALLIN UROMAX 15FX10 (LABEL) IMPLANT
KIT BALLN UROMAX 15FX4 (MISCELLANEOUS) IMPLANT
KIT BALLN UROMAX 26 75X4 (MISCELLANEOUS)
LASER FIBER DISP (UROLOGICAL SUPPLIES) IMPLANT
PACK CYSTO (CUSTOM PROCEDURE TRAY) ×5 IMPLANT
SET HIGH PRES BAL DIL (LABEL)
SHEATH ACCESS URETERAL 38CM (SHEATH) ×3 IMPLANT

## 2014-06-06 NOTE — Anesthesia Procedure Notes (Signed)
Procedure Name: LMA Insertion Date/Time: 06/06/2014 9:01 AM Performed by: Tyrone Nine Pre-anesthesia Checklist: Patient identified, Timeout performed, Emergency Drugs available, Suction available and Patient being monitored Patient Re-evaluated:Patient Re-evaluated prior to inductionOxygen Delivery Method: Circle system utilized Preoxygenation: Pre-oxygenation with 100% oxygen Intubation Type: IV induction Ventilation: Mask ventilation without difficulty LMA: LMA inserted LMA Size: 4.0 Number of attempts: 1 Placement Confirmation: positive ETCO2 and breath sounds checked- equal and bilateral Tube secured with: Tape Dental Injury: Teeth and Oropharynx as per pre-operative assessment

## 2014-06-06 NOTE — Progress Notes (Signed)
Bathroom door opened, pt crouching on top of commode seat. Voided in specipan, bloody urine on floor, under commode. Gown changed. Reminded not to climb on objects because its unsafe. Reminded he just had anesthesia.the patient states " it hurts".

## 2014-06-06 NOTE — Op Note (Signed)
PATIENT:  Brent [name]  PRE-OPERATIVE DIAGNOSIS: Bilateral ureteral and renal calculi, status post bilateral ureteral stenting  POST-OPERATIVE DIAGNOSIS: Same  PROCEDURE: Cystoscopy, bilateral double-J stent extraction, bilateral ureterorenoscopy, extraction of bilateral ureteral and renal calculi  SURGEON:  Bertram Millard. Peggy Monk, M.D.  ANESTHESIA:  General  EBL:  Minimal  DRAINS: None  LOCAL MEDICATIONS USED:  None  SPECIMEN:  Stones, the patient's family  INDICATION: Brent [name] is a 44 year old male recently treated with cystoscopy and bilateral double-J stent placement for bilateral ureteral calculi as well as infection. He presents at this time for cystoscopy, bilateral ureteroscopy, removal of bilateral ureteral and renal calculi. The patient has been on suppressive antibiotics. He is been instructed on ureteroscopy, risks and complications. He desires to proceed  Description of procedure: The patient was properly identified and marked (if applicable) in the holding area. They were then  taken to the operating room and placed on the table in a supine position. General anesthesia was then administered. Once fully anesthetized the patient was moved to the dorsolithotomy position and the genitalia and perineum were sterilely prepped and draped in standard fashion. An official timeout was then performed.  A 22 French panendoscope was passed directly into his bladder through his urethra which was normal. Bladder was inspected. Bilateral ureteral stents were placed. No other bladder lesions were seen. Some erythema around both ureteral orifices secondary to stent placement. The right ureteral stent was grasped and brought through the urethral meatus. A guidewire was placed through the stent, and the and the stent was removed over the guidewire once a guidewire had been in negotiated into the right renal pelvis. The cystoscope was removed. I used the rigid ureteroscope to go through the  urethra, and entered the right ureteral orifice. 2 stones were seen within the right ureter. There are grasped with the Nitinol basket, being small enough to be extracted without laser lithotripsy. There were then brought out through the urethra separately and saved. The entire ureter was then inspected-no further stones were seen.  I then grasped the left ureteral orifice with the cystoscope, and brought up to the meatus. Again, the guidewire is advanced through the stent up into the left renal pelvis. At this point, the stent was removed. I then passed the rigid ureteroscope through the urethra and up into the left ureter under direct visualization. 3 stones were seen on the left side, all distal. They were grasped separately, being small enough to be negotiated through the ureter without lithotripsy. There were then saved for specimen. The entire left ureter was then reinspected with the rigid scope and no further stones were seen.  I then passed the medium length 10/12 ureteral access sheath over the guidewire up to the proximal left ureter. The core was removed. The flexible digital ureteroscope was then advanced through this. Pyelocalyceal system was inspected. 2 medium size stones were grasped and the lower pole calyx and removed. These were small enough to be brought through the access sheath. 2 tiny stones in the lower pole calyx were also extracted. The entire pyelocalyceal system was inspected, no further calculi were seen. I removed the access sheath, and I felt no stent needed to be placed due to the patient's long-term stent placement previous.  A similar procedure with the digital access sheath was performed on the right side. Careful inspection of all calyces revealed 2 small Randall's plaques. The larger of these was able to be snared after he had been knocked loose by the  tip of the scope. The other small stone seen in the lower pole calyx could not be snared because of its small size. No  further calculi were seen in the right kidney. Careful inspection was performed throughout all calyces. The scope was then extracted. The digital access sheath was removed. The bladder was drained. I did not feel stents needed to be placed.  At this point, the procedure was terminated. He was awakened and taken to the PACU in stable condition. Fragments were given to the patient's family member.    PLAN OF CARE: Discharge to home after PACU  PATIENT DISPOSITION:  PACU - hemodynamically stable.

## 2014-06-06 NOTE — Anesthesia Preprocedure Evaluation (Addendum)
Anesthesia Evaluation  Patient identified by MRN, date of birth, ID band Patient awake    Reviewed: Allergy & Precautions, H&P , NPO status , Patient's Chart, lab work & pertinent test results  Airway Mallampati: II  TM Distance: >3 FB Neck ROM: Full    Dental no notable dental hx. (+) Edentulous Upper, Missing, Dental Advisory Given   Pulmonary shortness of breath, COPDCurrent Smoker,  breath sounds clear to auscultation  Pulmonary exam normal       Cardiovascular hypertension, Pt. on medications Rhythm:Regular Rate:Normal     Neuro/Psych  Headaches, PSYCHIATRIC DISORDERS Anxiety Depression    GI/Hepatic hiatal hernia, GERD-  Medicated and Controlled,(+)     substance abuse  alcohol use,   Endo/Other  diabetes  Renal/GU Renal disease  negative genitourinary   Musculoskeletal  (+) Arthritis -, Osteoarthritis and on steriods ,  Fibromyalgia -, narcotic dependent  Abdominal   Peds  Hematology negative hematology ROS (+)   Anesthesia Other Findings   Reproductive/Obstetrics negative OB ROS                            Anesthesia Physical Anesthesia Plan  ASA: III  Anesthesia Plan: General   Post-op Pain Management:    Induction: Intravenous  Airway Management Planned: LMA  Additional Equipment:   Intra-op Plan:   Post-operative Plan: Extubation in OR  Informed Consent: I have reviewed the patients History and Physical, chart, labs and discussed the procedure including the risks, benefits and alternatives for the proposed anesthesia with the patient or authorized representative who has indicated his/her understanding and acceptance.   Dental advisory given  Plan Discussed with: CRNA  Anesthesia Plan Comments:         Anesthesia Quick Evaluation

## 2014-06-06 NOTE — Anesthesia Postprocedure Evaluation (Signed)
  Anesthesia Post-op Note  Patient: Brent [name]  Procedure(s) Performed: Procedure(s): CYSTOSCOPY WITH J2 STENT EXTRACTION,,URETEROSCOPY WITH EXTRACTION OF STONES, (Bilateral) CYSTOSCOPY WITH URETEROSCOPY CYSTOSCOPY WITH STENT REMOVAL (Bilateral)  Patient Location: PACU  Anesthesia Type:General  Level of Consciousness: awake and alert   Airway and Oxygen Therapy: Patient Spontanous Breathing  Post-op Pain: moderate  Post-op Assessment: Post-op Vital signs reviewed, Patient's Cardiovascular Status Stable and Respiratory Function Stable  Post-op Vital Signs: Reviewed  Filed Vitals:   06/06/14 1100  BP: 145/95  Pulse: 96  Temp:   Resp: 14    Complications: No apparent anesthesia complications

## 2014-06-06 NOTE — Transfer of Care (Signed)
Immediate Anesthesia Transfer of Care Note  Patient: Brent [name]  Procedure(s) Performed: Procedure(s): CYSTOSCOPY WITH J2 STENT EXTRACTION,,URETEROSCOPY WITH EXTRACTION OF STONES, (Bilateral) CYSTOSCOPY WITH URETEROSCOPY CYSTOSCOPY WITH STENT REMOVAL (Bilateral)  Patient Location: PACU  Anesthesia Type:General  Level of Consciousness: awake, alert , oriented and patient cooperative  Airway & Oxygen Therapy: Patient Spontanous Breathing and Patient connected to nasal cannula oxygen  Post-op Assessment: Report given to RN and Post -op Vital signs reviewed and stable  Post vital signs: Reviewed and stable  Last Vitals:  Filed Vitals:   06/06/14 0814  BP: 105/65  Pulse: 89  Temp: 36.9 C  Resp: 16    Complications: No apparent anesthesia complications

## 2014-06-06 NOTE — Discharge Instructions (Signed)
1. You may see some blood in the urine and may have some burning with urination for 48-72 hours. You also may notice that you have to urinate more frequently or urgently after your procedure which is normal.  2. You should call should you develop an inability urinate, fever > 101, persistent nausea and vomiting that prevents you from eating or drinking to stay hydrated.  3. If you have a stent, you will likely urinate more frequently and urgently until the stent is removed and you may experience some discomfort/pain in the lower abdomen and flank especially when urinating. You may take pain medication prescribed to you if needed for pain. You may also intermittently have blood in the urine until the stent is removed. 4. If you have a catheter, you will be taught how to take care of the catheter by the nursing staff prior to discharge from the hospital.  You may periodically feel a strong urge to void with the catheter in place.  This is a bladder spasm and most often can occur when having a bowel movement or moving around. It is typically self-limited and usually will stop after a few minutes.  You may use some Vaseline or Neosporin around the tip of the catheter to reduce friction at the tip of the penis. You may also see some blood in the urine.  A very small amount of blood can make the urine look quite red.  As long as the catheter is draining well, there usually is not a problem.  However, if the catheter is not draining well and is bloody, you should call the office [phone]) to notify us.   Post Anesthesia Home Care Instructions  Activity: Get plenty of rest for the remainder of the day. A responsible adult should stay with you for 24 hours following the procedure.  For the next 24 hours, DO NOT: -Drive a car -Advertising copywriter -Drink alcoholic beverages -Take any medication unless instructed by your physician -Make any legal decisions or sign important papers.  Meals: Start with liquid  foods such as gelatin or soup. Progress to regular foods as tolerated. Avoid greasy, spicy, heavy foods. If nausea and/or vomiting occur, drink only clear liquids until the nausea and/or vomiting subsides. Call your physician if vomiting continues.  Special Instructions/Symptoms: Your throat may feel dry or sore from the anesthesia or the breathing tube placed in your throat during surgery. If this causes discomfort, gargle with warm salt water. The discomfort should disappear within 24 hours.  If you had a scopolamine patch placed behind your ear for the management of post- operative nausea and/or vomiting:  1. The medication in the patch is effective for 72 hours, after which it should be removed.  Wrap patch in a tissue and discard in the trash. Wash hands thoroughly with soap and water. 2. You may remove the patch earlier than 72 hours if you experience unpleasant side effects which may include dry mouth, dizziness or visual disturbances. 3. Avoid touching the patch. Wash your hands with soap and water after contact with the patch.    [facility] [phone] Post Ureteroscopy With or Without Stent Instructions  Definitions:  Ureter: The duct that transports urine from the kidney to the bladder. Stent:   A plastic hollow tube that is placed into the ureter, from the kidney to the                 bladder to prevent the ureter from swelling shut.  GENERAL  INSTRUCTIONS:  Despite the fact that no skin incisions were used, the area around the ureter and bladder is raw and irritated. The stent is a foreign body which will further irritate the bladder wall. This irritation is manifested by increased frequency of urination, both day and night, and by an increase in the urge to urinate. In some, the urge to urinate is present almost always. Sometimes the urge is strong enough that you may not be able to stop yourself from urinating. The only real cure is to remove the stent and  then give time for the bladder wall to heal which can't be done until the danger of the ureter swelling shut has passed, which varies.  You may see some blood in your urine while the stent is in place and a few days afterwards. Do not be alarmed, even if the urine was clear for a while. Get off your feet and drink lots of fluids until clearing occurs. If you start to pass clots or don't improve, call us.  DIET: You may return to your normal diet immediately. Because of the raw surface of your bladder, alcohol, spicy foods, acid type foods and drinks with caffeine may cause irritation or frequency and should be used in moderation. To keep your urine flowing freely and to avoid constipation, drink plenty of fluids during the day ( 8-10 glasses ). Tip: Avoid cranberry juice because it is very acidic.  ACTIVITY: Your physical activity doesn't need to be restricted. However, if you are very active, you may see some blood in your urine. We suggest that you reduce your activity under these circumstances until the bleeding has stopped.  BOWELS: It is important to keep your bowels regular during the postoperative period. Straining with bowel movements can cause bleeding. A bowel movement every other day is reasonable. Use a mild laxative if needed, such as Milk of Magnesia 2-3 tablespoons, or 2 Dulcolax tablets. Call if you continue to have problems. If you have been taking narcotics for pain, before, during or after your surgery, you may be constipated. Take a laxative if necessary.   MEDICATION: You should resume your pre-surgery medications unless told not to. In addition you will often be given an antibiotic to prevent infection. These should be taken as prescribed until the bottles are finished unless you are having an unusual reaction to one of the drugs.  PROBLEMS YOU SHOULD REPORT TO [place]:  Fevers over 100.5 Fahrenheit.  Heavy bleeding, or clots ( See above notes about blood in urine  ).  Inability to urinate.  Drug reactions ( hives, rash, nausea, vomiting, diarrhea ).  Severe burning or pain with urination that is not improving.  FOLLOW-UP: You will need a follow-up appointment to monitor your progress. Call for this appointment at the number listed above. Usually the first appointment will be about three to fourteen days after your surgery.

## 2014-06-07 ENCOUNTER — Encounter (HOSPITAL_BASED_OUTPATIENT_CLINIC_OR_DEPARTMENT_OTHER): Payer: Self-pay | Admitting: Urology

## 2014-06-28 ENCOUNTER — Ambulatory Visit: Payer: Self-pay | Admitting: Urology

## 2014-07-05 ENCOUNTER — Ambulatory Visit: Payer: Self-pay | Admitting: Urology

## 2014-07-08 ENCOUNTER — Emergency Department (HOSPITAL_COMMUNITY): Payer: Medicare Other

## 2014-07-08 ENCOUNTER — Encounter (HOSPITAL_COMMUNITY): Payer: Self-pay

## 2014-07-08 ENCOUNTER — Emergency Department (HOSPITAL_COMMUNITY)
Admission: EM | Admit: 2014-07-08 | Discharge: 2014-07-08 | Disposition: A | Discharge status: Home / Self Care | Payer: Medicare Other | Attending: Emergency Medicine | Admitting: Emergency Medicine

## 2014-07-08 DIAGNOSIS — Z789 Other specified health status: Secondary | ICD-10-CM

## 2014-07-08 DIAGNOSIS — Y998 Other external cause status: Secondary | ICD-10-CM | POA: Insufficient documentation

## 2014-07-08 DIAGNOSIS — M1990 Unspecified osteoarthritis, unspecified site: Secondary | ICD-10-CM | POA: Insufficient documentation

## 2014-07-08 DIAGNOSIS — G8929 Other chronic pain: Secondary | ICD-10-CM | POA: Insufficient documentation

## 2014-07-08 DIAGNOSIS — K219 Gastro-esophageal reflux disease without esophagitis: Secondary | ICD-10-CM | POA: Insufficient documentation

## 2014-07-08 DIAGNOSIS — S3991XA Unspecified injury of abdomen, initial encounter: Secondary | ICD-10-CM | POA: Diagnosis not present

## 2014-07-08 DIAGNOSIS — I10 Essential (primary) hypertension: Secondary | ICD-10-CM | POA: Diagnosis not present

## 2014-07-08 DIAGNOSIS — Z720 Tobacco use: Secondary | ICD-10-CM | POA: Diagnosis not present

## 2014-07-08 DIAGNOSIS — S50311A Abrasion of right elbow, initial encounter: Secondary | ICD-10-CM | POA: Diagnosis not present

## 2014-07-08 DIAGNOSIS — S50312A Abrasion of left elbow, initial encounter: Secondary | ICD-10-CM | POA: Diagnosis not present

## 2014-07-08 DIAGNOSIS — Y9289 Other specified places as the place of occurrence of the external cause: Secondary | ICD-10-CM | POA: Diagnosis not present

## 2014-07-08 DIAGNOSIS — Y9389 Activity, other specified: Secondary | ICD-10-CM | POA: Insufficient documentation

## 2014-07-08 DIAGNOSIS — M797 Fibromyalgia: Secondary | ICD-10-CM | POA: Diagnosis not present

## 2014-07-08 DIAGNOSIS — S3992XA Unspecified injury of lower back, initial encounter: Secondary | ICD-10-CM | POA: Insufficient documentation

## 2014-07-08 DIAGNOSIS — S50812A Abrasion of left forearm, initial encounter: Secondary | ICD-10-CM | POA: Diagnosis not present

## 2014-07-08 DIAGNOSIS — Z87448 Personal history of other diseases of urinary system: Secondary | ICD-10-CM | POA: Insufficient documentation

## 2014-07-08 DIAGNOSIS — S50811A Abrasion of right forearm, initial encounter: Secondary | ICD-10-CM | POA: Diagnosis not present

## 2014-07-08 DIAGNOSIS — S0990XA Unspecified injury of head, initial encounter: Secondary | ICD-10-CM | POA: Insufficient documentation

## 2014-07-08 DIAGNOSIS — F410 Panic disorder [episodic paroxysmal anxiety] without agoraphobia: Secondary | ICD-10-CM | POA: Insufficient documentation

## 2014-07-08 DIAGNOSIS — Z87442 Personal history of urinary calculi: Secondary | ICD-10-CM | POA: Diagnosis not present

## 2014-07-08 DIAGNOSIS — Z8619 Personal history of other infectious and parasitic diseases: Secondary | ICD-10-CM | POA: Insufficient documentation

## 2014-07-08 DIAGNOSIS — J449 Chronic obstructive pulmonary disease, unspecified: Secondary | ICD-10-CM | POA: Diagnosis not present

## 2014-07-08 DIAGNOSIS — W1839XA Other fall on same level, initial encounter: Secondary | ICD-10-CM | POA: Diagnosis not present

## 2014-07-08 DIAGNOSIS — S0001XA Abrasion of scalp, initial encounter: Secondary | ICD-10-CM | POA: Diagnosis not present

## 2014-07-08 DIAGNOSIS — Z79899 Other long term (current) drug therapy: Secondary | ICD-10-CM | POA: Insufficient documentation

## 2014-07-08 DIAGNOSIS — F10129 Alcohol abuse with intoxication, unspecified: Secondary | ICD-10-CM | POA: Insufficient documentation

## 2014-07-08 DIAGNOSIS — F329 Major depressive disorder, single episode, unspecified: Secondary | ICD-10-CM | POA: Diagnosis not present

## 2014-07-08 MED ORDER — OXYCODONE-ACETAMINOPHEN 5-325 MG PO TABS
1.0000 | ORAL_TABLET | Freq: Once | ORAL | Status: AC
Start: 2014-07-08 — End: 2014-07-08
  Administered 2014-07-08: 1 via ORAL
  Filled 2014-07-08: qty 1
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
## 2014-07-08 NOTE — Discharge Instructions (Signed)
Alcohol and Nutrition °Nutrition serves two purposes. It provides energy. It also maintains body structure and function. Food supplies energy. It also provides the building blocks needed to replace worn or damaged cells. Alcoholics often eat poorly. This limits their supply of essential nutrients. This affects energy supply and structure maintenance. Alcohol also affects the body's nutrients in: °· Digestion. °· Storage. °· Using and getting rid of waste products. °IMPAIRMENT OF NUTRIENT DIGESTION AND UTILIZATION  °· Once ingested, food must be broken down into small components (digested). Then it is available for energy. It helps maintain body structure and function. Digestion begins in the mouth. It continues in the stomach and intestines, with help from the pancreas. The nutrients from digested food are absorbed from the intestines into the blood. Then they are carried to the liver. The liver prepares nutrients for: °· Immediate use. °· Storage and future use. °· Alcohol inhibits the breakdown of nutrients into usable molecules. °· It decreases secretion of digestive enzymes from the pancreas. °· Alcohol impairs nutrient absorption by damaging the cells lining the stomach and intestines. °· It also interferes with moving some nutrients into the blood. °· In addition, nutritional deficiencies themselves may lead to further absorption problems. °· For example, folate deficiency changes the cells that line the small intestine. This impairs how water is absorbed. It also affects absorbed nutrients. These include glucose, sodium, and additional folate. °· Even if nutrients are digested and absorbed, alcohol can prevent them from being fully used. It changes their transport, storage, and excretion. Impaired utilization of nutrients by alcoholics is indicated by: °· Decreased liver stores of vitamins, such as vitamin A. °· Increased excretion of nutrients such as fat. °ALCOHOL AND ENERGY SUPPLY  °· Three basic  nutritional components found in food are: °· Carbohydrates. °· Proteins. °· Fats. °· These are used as energy. Some alcoholics take in as much as 50% of their total daily calories from alcohol. They often neglect important foods. °· Even when enough food is eaten, alcohol can impair the ways the body controls blood sugar (glucose) levels. It may either increase or decrease blood sugar. °· In non-diabetic alcoholics, increased blood sugar (hyperglycemia) is caused by poor insulin secretion. It is usually temporary. °· Decreased blood sugar (hypoglycemia) can cause serious injury even if this condition is short-lived. Low blood sugar can happen when a fasting or malnourished person drinks alcohol. When there is no food to supply energy, stored sugar is used up. The products of alcohol inhibit forming glucose from other compounds such as amino acids. As a result, alcohol causes the brain and other body tissue to lack glucose. It is needed for energy and function. °· Alcohol is an energy source. But how the body processes and uses the energy from alcohol is complex. Also, when alcohol is substituted for carbohydrates, subjects tend to lose weight. This indicates that they get less energy from alcohol than from food. °ALCOHOL - MAINTAINING CELL STRUCTURE AND FUNCTION  °Structure °Cells are made mostly of protein. So an adequate protein diet is important for maintaining cell structure. This is especially true if cells are being damaged. Research indicates that alcohol affects protein nutrition by causing impaired: °· Digestion of proteins to amino acids. °· Processing of amino acids by the small intestine and liver. °· Synthesis of proteins from amino acids. °· Protein secretion by the liver. °Function °Nutrients are essential for the body to function well. They provide the tools that the body needs to work well:  °·   Proteins. °· Vitamins. °· Minerals. °Alcohol can disrupt body function. It may cause nutrient  deficiencies. And it may interfere with the way nutrients are processed. °Vitamins °· Vitamins are essential to maintain growth and normal metabolism. They regulate many of the body`s processes. Chronic heavy drinking causes deficiencies in many vitamins. This is caused by eating less. And, in some cases, vitamins may be poorly absorbed. For example, alcohol inhibits fat absorption. It impairs how the vitamins A, E, and D are normally absorbed along with dietary fats. Not enough vitamin A may cause night blindness. Not enough vitamin D may cause softening of the bones. °· Some alcoholics lack vitamins A, C, D, E, K, and the B vitamins. These are all involved in wound healing and cell maintenance. In particular, because vitamin K is necessary for blood clotting, lacking that vitamin can cause delayed clotting. The result is excess bleeding. Lacking other vitamins involved in brain function may cause severe neurological damage. °Minerals °Deficiencies of minerals such as calcium, magnesium, iron, and zinc are common in alcoholics. The alcohol itself does not seem to affect how these minerals are absorbed. Rather, they seem to occur secondary to other alcohol-related problems, such as: °· Less calcium absorbed. °· Not enough magnesium. °· More urinary excretion. °· Vomiting. °· Diarrhea. °· Not enough iron due to gastrointestinal bleeding. °· Not enough zinc or losses related to other nutrient deficiencies. °· Mineral deficiencies can cause a variety of medical consequences. These range from calcium-related bone disease to zinc-related night blindness and skin lesions. °ALCOHOL, MALNUTRITION, AND MEDICAL COMPLICATIONS  °Liver Disease  °· Alcoholic liver damage is caused primarily by alcohol itself. But poor nutrition may increase the risk of alcohol-related liver damage. For example, nutrients normally found in the liver are known to be affected by drinking alcohol. These include carotenoids, which are the major  sources of vitamin A, and vitamin E compounds. Decreases in such nutrients may play some role in alcohol-related liver damage. °Pancreatitis °· Research suggests that malnutrition may increase the risk of developing alcoholic pancreatitis. Research suggests that a diet lacking in protein may increase alcohol's damaging effect on the pancreas. °Brain °· Nutritional deficiencies may have severe effects on brain function. These may be permanent. Specifically, thiamine deficiencies are often seen in alcoholics. They can cause severe neurological problems. These include: °¨ Impaired movement. °¨ Memory loss seen in Wernicke-Korsakoff syndrome. °Pregnancy °· Alcohol has toxic effects on fetal development. It causes alcohol-related birth defects. They include fetal alcohol syndrome. Alcohol itself is toxic to the fetus. Also, the nutritional deficiency can affect how the fetus develops. That may compound the risk of developmental damage. °· Nutritional needs during pregnancy are 10% to 30% greater than normal. Food intake can increase by as much as 140% to cover the needs of both mother and fetus. An alcoholic mother`s nutritional problems may adversely affect the nutrition of the fetus. And alcohol itself can also restrict nutrition flow to the fetus. °NUTRITIONAL STATUS OF ALCOHOLICS  °Techniques for assessing nutritional status include: °· Taking body measurements to estimate fat reserves. They include: °¨ Weight. °¨ Height. °¨ Mass. °¨ Skin fold thickness. °· Performing blood analysis to provide measurements of circulating: °¨ Proteins. °¨ Vitamins. °¨ Minerals. °· These techniques tend to be imprecise. For many nutrients, there is no clear "cut-off" point that would allow an accurate definition of deficiency. So assessing the nutritional status of alcoholics is limited by these techniques. Dietary status may provide information about the risk of developing nutritional problems.   Dietary status is assessed by:  Taking  patients' dietary histories.  Evaluating the amount and types of food they are eating.  It is difficult to determine what exact amount of alcohol begins to have damaging effects on nutrition. In general, moderate drinkers have 2 drinks or less per day. They seem to be at little risk for nutritional problems. Various medical disorders begin to appear at greater levels.  Research indicates that the majority of even the heaviest drinkers have few obvious nutritional deficiencies. Many alcoholics who are hospitalized for medical complications of their disease do have severe malnutrition. Alcoholics tend to eat poorly. Often they eat less than the amounts of food necessary to provide enough:  Carbohydrates.  Protein.  Fat.  Vitamins A and C.  B vitamins.  Minerals like calcium and iron. Of major concern is alcohol's effect on digesting food and use of nutrients. It may shift a mildly malnourished person toward severe malnutrition. Document Released: 11/22/2004 Document Revised: 04/22/2011 Document Reviewed: 05/08/2005 [facility] Patient Information 2015 [place], [place]. This information is not intended to replace advice given to you by your health care provider. Make sure you discuss any questions you have with your health care provider.  Head Injury You have a head injury. Headaches and throwing up (vomiting) are common after a head injury. It should be easy to wake up from sleeping. Sometimes you must stay in the hospital. Most problems happen within the first 24 hours. Side effects may occur up to 7-10 days after the injury.  WHAT ARE THE TYPES OF HEAD INJURIES? Head injuries can be as minor as a bump. Some head injuries can be more severe. More severe head injuries include:  A jarring injury to the brain (concussion).  A bruise of the brain (contusion). This mean there is bleeding in the brain that can cause swelling.  A cracked skull (skull fracture).  Bleeding in the brain that  collects, clots, and forms a bump (hematoma). WHEN SHOULD I GET HELP RIGHT AWAY?   You are confused or sleepy.  You cannot be woken up.  You feel sick to your stomach (nauseous) or keep throwing up (vomiting).  Your dizziness or unsteadiness is getting worse.  You have very bad, lasting headaches that are not helped by medicine. Take medicines only as told by your doctor.  You cannot use your arms or legs like normal.  You cannot walk.  You notice changes in the black spots in the center of the colored part of your eye (pupil).  You have clear or bloody fluid coming from your nose or ears.  You have trouble seeing. During the next 24 hours after the injury, you must stay with someone who can watch you. This person should get help right away (call 911 in the [place] if you start to shake and are not able to control it (have seizures), you pass out, or you are unable to wake up. HOW CAN I PREVENT A HEAD INJURY IN THE FUTURE?  Wear seat belts.  Wear a helmet while bike riding and playing sports like football.  Stay away from dangerous activities around the house. WHEN CAN I RETURN TO NORMAL ACTIVITIES AND ATHLETICS? See your doctor before doing these activities. You should not do normal activities or play contact sports until 1 week after the following symptoms have stopped:  Headache that does not go away.  Dizziness.  Poor attention.  Confusion.  Memory problems.  Sickness to your stomach or throwing up.  Tiredness.  Fussiness.  Bothered by bright lights or loud noises.  Anxiousness or depression.  Restless sleep. MAKE SURE YOU:   Understand these instructions.  Will watch your condition.  Will get help right away if you are not doing well or get worse. Document Released: 01/11/2008 Document Revised: 06/14/2013 Document Reviewed: 10/05/2012 [facility] Patient Information 2015 [place], [place]. This information is not intended to replace advice given to you by  your health care provider. Make sure you discuss any questions you have with your health care provider.

## 2014-07-08 NOTE — ED Provider Notes (Signed)
CSN: [id number]     Arrival date & time 07/08/14  4098 History  This chart was scribed for Raeford Razor, MD by Ronney Lion, ED Scribe. This patient was seen in room APA09/APA09 and the patient's care was started at 11:15 AM.    Chief Complaint  Patient presents with  . Fall   Patient is a 44 y.o. male presenting with fall. The history is provided by the patient and a parent. No language interpreter was used.  Fall This is a new problem. The current episode started 1 to 2 hours ago. The problem occurs rarely. The problem has not changed since onset.Associated symptoms include abdominal pain. Pertinent negatives include no chest pain, no headaches and no shortness of breath. Nothing aggravates the symptoms. Nothing relieves the symptoms. He has tried nothing for the symptoms.    HPI Comments: Brent [name] is a 44 y.o. male who presents to the Emergency Department S/P falling when drunk. His mom states he is still intoxicated as he was drinking all night and this morning, and was brought in by a family member out of concern when he fell. He complains of back pain and generalized abdominal pain. He also complains of nausea and vomiting but states that began before the fall. Patient reports a history of macular edema, for which he took prednisone. He states he has "4 lesions in his brain - T2 is worse than T1." Patient denies frequent EtOH consumption. He denies blood thinner use.  He states he is due for a tetanus shot.   Past Medical History  Diagnosis Date  . Retinal vasculitis   . Condylomata acuminata in male     multiple procedures --  penile, peri-rectal , perineum  . GERD (gastroesophageal reflux disease)   . Chronic back pain   . Multiple sclerosis     pt. states has 4 small brain lesions  . Arthritis   . Depression   . Hypertension     was on medication for short time, htn was caused by prednisone per pt  . Anxiety     Disabled due to panic attacks  . Headache(784.0)   .  Fibromyalgia   . Emphysematous COPD   . Dyspnea on exertion   . Borderline type 2 diabetes mellitus   . History of suicidal ideation     2006   adx  . History of panic attacks   . History of esophageal dilatation   . History of chronic pancreatitis     severeal adx for this /  2008  dx alcoholic pancreatitis  . Bilateral ureteral calculi   . Nephrolithiasis     bilateral   . History of kidney stones   . History of sepsis     adx 05-01-2014--  urosepsis due to kidney stones obstruction/ hydronephrosis  . BPH (benign prostatic hypertrophy)   . DDD (degenerative disc disease), cervical   . History of hiatal hernia    Past Surgical History  Procedure Laterality Date  . Wisdom tooth extraction    . Multiple extractions with alveoloplasty N/A 04/22/2014    Procedure: MULTIPLE EXTRACTIONS ( 2,3,5,6,7,8,9,10,11,13,14,15,21,28  WITH ALVEOLOPLASTY;  Surgeon: Ocie Doyne, DDS;  Location: [facility];  Service: Oral Surgery;  Laterality: N/A;  . Colonoscopy  10/08/2011    Jenkins:Normal colon/Anal condyloma without extension proximal to dentate line  . Flexible bronchoscopy N/A 08/12/2012    Procedure: FLEXIBLE BRONCHOSCOPY;  Surgeon: Fredirick Maudlin, MD;  Location: [facility];  Service: Pulmonary;  Laterality: N/A;  .  Esophagogastroduodenoscopy (egd) with propofol N/A 11/12/2012    WUJ:WJXBJY esophagus s/p  passage of a Maloney dilator and biopsy. Abnormal gastric mucosa-status post biopsy  . Maloney dilation N/A 11/12/2012    Procedure: MALONEY DILATION (54mm);  Surgeon: Corbin Ade, MD;  Location: [facility];  Service: Endoscopy;  Laterality: N/A;  . Esophageal biopsy  11/12/2012    Procedure: GASTRIC AND ESOPHAGEAL BIOPSIES;  Surgeon: Corbin Ade, MD;  Location: [facility];  Service: Endoscopy;;  . Cystoscopy w/ ureteral stent placement Bilateral 05/01/2014    Procedure: CYSTOSCOPY WITH BILATERAL RETROGRADE PYELOGRAM; BILATERAL URETERAL STENT PLACEMENT;  Surgeon: Barron Alvine, MD;  Location: [facility];   Service: Urology;  Laterality: Bilateral;  . Electrocautery/ desiccation of condyloma lesions  01-15-2008  &  12-29-2009    PENIS, PERI-RECTAL , PERINUEM  . Cataract extraction w/ intraocular lens  implant, bilateral    . Cystoscopy with ureteroscopy and stent placement Bilateral 06/06/2014    Procedure: CYSTOSCOPY WITH J2 STENT EXTRACTION,,URETEROSCOPY WITH EXTRACTION OF STONES,;  Surgeon: Marcine Matar, MD;  Location: [facility];  Service: Urology;  Laterality: Bilateral;  . Cystoscopy with ureteroscopy  06/06/2014    Procedure: CYSTOSCOPY WITH URETEROSCOPY;  Surgeon: Marcine Matar, MD;  Location: [facility];  Service: Urology;;  . Cystoscopy w/ ureteral stent removal Bilateral 06/06/2014    Procedure: CYSTOSCOPY WITH STENT REMOVAL;  Surgeon: Marcine Matar, MD;  Location: [facility];  Service: Urology;  Laterality: Bilateral;   Family History  Problem Relation Age of Onset  . Hypertension Mother   . Breast cancer Mother   . Melanoma Mother   . Stroke Mother   . Lung cancer Father   . Lung cancer Father 74  . Colon cancer Neg Hx   . Colitis Neg Hx   . Cirrhosis Neg Hx   . Liver disease Neg Hx   . Pancreatic cancer Neg Hx   . Pancreatitis Neg Hx   . Anesthesia problems Neg Hx   . Hypotension Neg Hx   . Malignant hyperthermia Neg Hx   . Pseudochol deficiency Neg Hx    History  Substance Use Topics  . Smoking status: Current Every Day Smoker -- 1.00 packs/day for 20 years    Types: Cigarettes  . Smokeless tobacco: Not on file  . Alcohol Use: No     Comment:  none since 08/2011    Review of Systems  Respiratory: Negative for shortness of breath.   Cardiovascular: Negative for chest pain.  Gastrointestinal: Positive for nausea (likely due to EtOH consumption), vomiting (likely due to EtOH consumption) and abdominal pain.  Musculoskeletal: Positive for back pain.  Neurological: Negative for headaches.  All other systems  reviewed and are negative.     Allergies  Review of patient's allergies indicates no known allergies.  Home Medications   Prior to Admission medications   Medication Sig Start Date End Date Taking? Authorizing Provider  ALPRAZolam Prudy Feeler) 1 MG tablet Take 1 mg by mouth 4 (four) times daily.   Yes Historical Provider, MD  calcium carbonate (OS-CAL) 600 MG TABS tablet Take 600 mg by mouth daily with breakfast.   Yes Historical Provider, MD  Cholecalciferol (VITAMIN D3) 5000 UNITS CAPS Take 1 capsule by mouth daily.   Yes Historical Provider, MD  Cyanocobalamin (VITAMIN B-12 PO) Take 1 tablet by mouth daily.   Yes Historical Provider, MD  fluticasone (FLONASE) 50 MCG/ACT nasal spray Place 1 spray into both nostrils daily as needed (congestion).  Yes Historical Provider, MD  HYDROcodone-acetaminophen (NORCO) 10-325 MG per tablet Take 1-2 tablets by mouth every 6 (six) hours as needed for moderate pain. 05/03/14  Yes Erick Blinks, MD  hydroxypropyl methylcellulose / hypromellose (ISOPTO TEARS / GONIOVISC) 2.5 % ophthalmic solution Place 1 drop into both eyes as needed for dry eyes.   Yes Historical Provider, MD  Multiple Vitamin (MULTIVITAMIN) tablet Take 1 tablet by mouth daily.   Yes Historical Provider, MD  omeprazole (PRILOSEC) 40 MG capsule Take 40 mg by mouth at bedtime.    Yes Historical Provider, MD  polyethylene glycol (MIRALAX / GLYCOLAX) packet Take 17 g by mouth daily. Patient taking differently: Take 17 g by mouth daily as needed for mild constipation.  05/03/14  Yes Erick Blinks, MD  zolpidem (AMBIEN) 10 MG tablet Take 10 mg by mouth at bedtime.   Yes Historical Provider, MD  ciprofloxacin (CIPRO) 250 MG tablet Take 1 tablet (250 mg total) by mouth 2 (two) times daily. 06/06/14   Marcine Matar, MD  Homeopathic Products [facility] ALLERGY EYE RELIEF) SOLN Place 1 drop into both eyes 4 (four) times daily. Margot Ables, Hepar sulphuris    Historical Provider, MD   oxyCODONE-acetaminophen (PERCOCET) 10-325 MG per tablet Take 1-2 tablets by mouth every 4 (four) hours as needed for pain. Patient not taking: Reported on 07/08/2014 04/22/14   Ocie Doyne, DDS  promethazine (PHENERGAN) 25 MG tablet Take 1 tablet (25 mg total) by mouth every 6 (six) hours as needed. Patient not taking: Reported on 07/08/2014 05/03/14   Erick Blinks, MD   Pulse 120  Temp(Src) 98.6 F (37 C) (Oral)  Resp 20  Ht 5' (1.524 m)  Wt 116 lb (52.617 kg)  BMI 22.65 kg/m2  SpO2 97% Physical Exam  Constitutional: He is oriented to person, place, and time. He appears well-developed and well-nourished. No distress.  Cachetic and chronically ill-appearing.   HENT:  Head: Normocephalic and atraumatic.  Eyes: Conjunctivae are normal. Right eye exhibits no discharge. Left eye exhibits no discharge.  Neck: Neck supple.  Cardiovascular: Normal rate, regular rhythm and normal heart sounds.  Exam reveals no gallop and no friction rub.   No murmur heard. Pulmonary/Chest: Effort normal and breath sounds normal. No respiratory distress.  Abdominal: Soft. He exhibits no distension. There is tenderness. There is no rebound and no guarding.  Mild diffuse abdominal tenderness. No rebound or guarding.  Musculoskeletal: He exhibits tenderness. He exhibits no edema.  No midline cervical tenderness. Scattered abrasions to forearms and elbows.  Neurological: He is alert and oriented to person, place, and time. No cranial nerve deficit. He exhibits normal muscle tone. Coordination normal.  Making inappropriate comments. Somewhat slow to respond to questioning. Follows commands.  Skin: Skin is warm and dry.  Scalp abrasion near the vertex of the skull.  Psychiatric: He has a normal mood and affect. His behavior is normal. Thought content normal.  Nursing note and vitals reviewed.   ED Course  Procedures (including critical care time)  DIAGNOSTIC STUDIES: Oxygen Saturation is 97% on RA, normal by  my interpretation.    COORDINATION OF CARE: 11:23 AM - Discussed treatment plan with pt at bedside which includes CT scan, nausea medication, and pain medication, and pt agreed to plan.   Medications  oxyCODONE-acetaminophen (PERCOCET/ROXICET) 5-325 MG per tablet 1 tablet (1 tablet Oral Given 07/08/14 1156)     Labs Review Labs Reviewed - No data to display  Imaging Review Ct Head Wo Contrast  07/08/2014  CLINICAL DATA:  Fall.  Uncertain if loss of consciousness.  EXAM: CT HEAD WITHOUT CONTRAST  TECHNIQUE: Contiguous axial images were obtained from the base of the skull through the vertex without intravenous contrast.  COMPARISON:  None.  FINDINGS: Bony calvarium appears intact. No mass effect or midline shift is noted. Ventricular size is within normal limits. There is no evidence of mass lesion, hemorrhage or acute infarction.  IMPRESSION: Normal head CT.   Electronically Signed   By: Lupita Raider, M.D.   On: 07/08/2014 12:16     EKG Interpretation None      MDM   Final diagnoses:  Closed head injury, initial encounter  Alcohol ingestion   I personally preformed the services scribed in my presence. The recorded information has been reviewed is accurate. Raeford Razor, MD.     Raeford Razor, MD 07/14/14 [phone]

## 2014-11-06 ENCOUNTER — Emergency Department (HOSPITAL_COMMUNITY): Payer: Medicare Other

## 2014-11-06 ENCOUNTER — Inpatient Hospital Stay (HOSPITAL_COMMUNITY)
Admission: EM | Admit: 2014-11-06 | Discharge: 2014-11-10 | DRG: 440 | Disposition: A | Discharge status: Home / Self Care | Payer: Medicare Other | Attending: Family Medicine | Admitting: Family Medicine

## 2014-11-06 ENCOUNTER — Encounter (HOSPITAL_COMMUNITY): Payer: Self-pay | Admitting: Emergency Medicine

## 2014-11-06 DIAGNOSIS — Z803 Family history of malignant neoplasm of breast: Secondary | ICD-10-CM

## 2014-11-06 DIAGNOSIS — M797 Fibromyalgia: Secondary | ICD-10-CM | POA: Diagnosis present

## 2014-11-06 DIAGNOSIS — K852 Alcohol induced acute pancreatitis without necrosis or infection: Secondary | ICD-10-CM | POA: Diagnosis present

## 2014-11-06 DIAGNOSIS — J449 Chronic obstructive pulmonary disease, unspecified: Secondary | ICD-10-CM | POA: Diagnosis present

## 2014-11-06 DIAGNOSIS — F17210 Nicotine dependence, cigarettes, uncomplicated: Secondary | ICD-10-CM | POA: Diagnosis present

## 2014-11-06 DIAGNOSIS — Z808 Family history of malignant neoplasm of other organs or systems: Secondary | ICD-10-CM

## 2014-11-06 DIAGNOSIS — G35 Multiple sclerosis: Secondary | ICD-10-CM | POA: Diagnosis present

## 2014-11-06 DIAGNOSIS — N400 Enlarged prostate without lower urinary tract symptoms: Secondary | ICD-10-CM | POA: Diagnosis present

## 2014-11-06 DIAGNOSIS — Z8249 Family history of ischemic heart disease and other diseases of the circulatory system: Secondary | ICD-10-CM

## 2014-11-06 DIAGNOSIS — F1010 Alcohol abuse, uncomplicated: Secondary | ICD-10-CM | POA: Diagnosis present

## 2014-11-06 DIAGNOSIS — K219 Gastro-esophageal reflux disease without esophagitis: Secondary | ICD-10-CM | POA: Diagnosis present

## 2014-11-06 DIAGNOSIS — Z801 Family history of malignant neoplasm of trachea, bronchus and lung: Secondary | ICD-10-CM

## 2014-11-06 DIAGNOSIS — K859 Acute pancreatitis without necrosis or infection, unspecified: Secondary | ICD-10-CM | POA: Diagnosis present

## 2014-11-06 DIAGNOSIS — M1990 Unspecified osteoarthritis, unspecified site: Secondary | ICD-10-CM | POA: Diagnosis present

## 2014-11-06 DIAGNOSIS — Z720 Tobacco use: Secondary | ICD-10-CM | POA: Diagnosis present

## 2014-11-06 DIAGNOSIS — J189 Pneumonia, unspecified organism: Secondary | ICD-10-CM

## 2014-11-06 DIAGNOSIS — Z823 Family history of stroke: Secondary | ICD-10-CM

## 2014-11-06 DIAGNOSIS — F410 Panic disorder [episodic paroxysmal anxiety] without agoraphobia: Secondary | ICD-10-CM | POA: Diagnosis present

## 2014-11-06 DIAGNOSIS — R1013 Epigastric pain: Secondary | ICD-10-CM | POA: Diagnosis present

## 2014-11-06 DIAGNOSIS — I10 Essential (primary) hypertension: Secondary | ICD-10-CM | POA: Diagnosis present

## 2014-11-06 LAB — CBC
HCT: 41.5 % (ref 39.0–52.0)
Hemoglobin: 13.5 g/dL (ref 13.0–17.0)
MCH: 29.1 pg (ref 26.0–34.0)
MCHC: 32.5 g/dL (ref 30.0–36.0)
MCV: 89.4 fL (ref 78.0–100.0)
PLATELETS: 388 10*3/uL (ref 150–400)
RBC: 4.64 MIL/uL (ref 4.22–5.81)
RDW: 16.6 % — AB (ref 11.5–15.5)
WBC: 22.9 10*3/uL — ABNORMAL HIGH (ref 4.0–10.5)

## 2014-11-06 LAB — COMPREHENSIVE METABOLIC PANEL
ALT: 17 U/L (ref 17–63)
AST: 16 U/L (ref 15–41)
Albumin: 3.3 g/dL — ABNORMAL LOW (ref 3.5–5.0)
Alkaline Phosphatase: 87 U/L (ref 38–126)
Anion gap: 7 (ref 5–15)
BUN: 10 mg/dL (ref 6–20)
CO2: 26 mmol/L (ref 22–32)
CREATININE: 0.65 mg/dL (ref 0.61–1.24)
Calcium: 7.5 mg/dL — ABNORMAL LOW (ref 8.9–10.3)
Chloride: 108 mmol/L (ref 101–111)
GFR calc non Af Amer: >60 mL/min (ref >60)
Glucose, Bld: 102 mg/dL — ABNORMAL HIGH (ref 65–99)
Potassium: 3.4 mmol/L — ABNORMAL LOW (ref 3.5–5.1)
SODIUM: 141 mmol/L (ref 135–145)
Total Bilirubin: 0.8 mg/dL (ref 0.3–1.2)
Total Protein: 5.7 g/dL — ABNORMAL LOW (ref 6.5–8.1)

## 2014-11-06 MED ORDER — MAGNESIUM SULFATE IN D5W 10-5 MG/ML-% IV SOLN
INTRAVENOUS | Status: AC
  Filled 2014-11-06: qty 100

## 2014-11-06 MED ORDER — ONDANSETRON HCL 4 MG/2ML IJ SOLN
4.0000 mg | Freq: Once | INTRAMUSCULAR | Status: AC
Start: 2014-11-06 — End: 2014-11-06
  Administered 2014-11-06: 4 mg via INTRAVENOUS

## 2014-11-06 MED ORDER — MAGNESIUM SULFATE 2 GM/50ML IV SOLN
1.0000 g | Freq: Once | INTRAVENOUS | Status: DC
  Filled 2014-11-06: qty 50

## 2014-11-06 MED ORDER — SODIUM CHLORIDE 0.9 % IV BOLUS (SEPSIS)
1000.0000 mL | Freq: Once | INTRAVENOUS | Status: AC
  Administered 2014-11-06: 1000 mL via INTRAVENOUS

## 2014-11-06 MED ORDER — ONDANSETRON HCL 4 MG/2ML IJ SOLN
INTRAMUSCULAR | Status: AC
Start: 2014-11-06 — End: 2014-11-06
  Administered 2014-11-06: 4 mg via INTRAVENOUS
  Filled 2014-11-06: qty 2

## 2014-11-06 MED ORDER — MAGNESIUM SULFATE IN D5W 10-5 MG/ML-% IV SOLN: 1.0000 g | Freq: Once | INTRAVENOUS | Status: DC

## 2014-11-06 MED ORDER — ONDANSETRON HCL 4 MG/2ML IJ SOLN
4.0000 mg | Freq: Once | INTRAMUSCULAR | Status: DC | PRN
  Filled 2014-11-06: qty 2

## 2014-11-06 MED ORDER — MORPHINE SULFATE (PF) 4 MG/ML IV SOLN
4.0000 mg | Freq: Once | INTRAVENOUS | Status: AC
  Administered 2014-11-06: 4 mg via INTRAVENOUS
  Filled 2014-11-06: qty 1

## 2014-11-06 MED ORDER — LORAZEPAM 2 MG/ML IJ SOLN
1.0000 mg | Freq: Once | INTRAMUSCULAR | Status: AC
  Administered 2014-11-06: 1 mg via INTRAVENOUS
  Filled 2014-11-06: qty 1

## 2014-11-06 MED ORDER — THIAMINE HCL 100 MG/ML IJ SOLN
Freq: Once | INTRAVENOUS | Status: AC
  Administered 2014-11-07: via INTRAVENOUS
  Filled 2014-11-06: qty 1000

## 2014-11-06 MED ORDER — M.V.I. ADULT IV INJ
INJECTION | Freq: Once | INTRAVENOUS | Status: DC
  Filled 2014-11-06: qty 1000

## 2014-11-06 MED ORDER — M.V.I. ADULT IV INJ
INJECTION | INTRAVENOUS | Status: AC
  Filled 2014-11-06: qty 10

## 2014-11-06 MED ORDER — FOLIC ACID 5 MG/ML IJ SOLN
INTRAMUSCULAR | Status: AC
  Filled 2014-11-06: qty 0.2

## 2014-11-06 MED ORDER — THIAMINE HCL 100 MG/ML IJ SOLN
INTRAMUSCULAR | Status: AC
  Filled 2014-11-06: qty 2

## 2014-11-06 NOTE — ED Notes (Signed)
Pt has hx of pancreatitis, drank 18 beers yesterday, has not ate or drank anything today.  Now having abd pain/nausea/vomiting

## 2014-11-06 NOTE — ED Provider Notes (Signed)
CSN: [id number]     Arrival date & time 11/06/14  2204 History   First MD Initiated Contact with Patient 11/06/14 2228     Chief Complaint  Patient presents with  . Abdominal Pain     (Consider location/radiation/quality/duration/timing/severity/associated sxs/prior Treatment) HPI Comments: Patient has a history of pancreatitis. He states that he drank 18 beers on yesterday. His mother states he has not been eating very well recently, mostly drinking milk.  Patient is a 44 y.o. male presenting with abdominal pain. The history is provided by the patient.  Abdominal Pain Pain location:  Epigastric Pain quality: aching   Pain radiates to:  Back Pain severity:  Severe Onset quality:  Gradual Duration:  1 day Timing:  Intermittent Progression:  Worsening Chronicity:  Chronic Context: alcohol use   Relieved by:  Nothing Worsened by:  Nothing tried Ineffective treatments:  None tried Associated symptoms: nausea and vomiting   Associated symptoms: no constipation, no diarrhea, no hematemesis, no hematochezia and no hematuria   Risk factors: alcohol abuse     Past Medical History  Diagnosis Date  . Retinal vasculitis   . Condylomata acuminata in male     multiple procedures --  penile, peri-rectal , perineum  . GERD (gastroesophageal reflux disease)   . Chronic back pain   . Multiple sclerosis     pt. states has 4 small brain lesions  . Arthritis   . Depression   . Hypertension     was on medication for short time, htn was caused by prednisone per pt  . Anxiety     Disabled due to panic attacks  . Headache(784.0)   . Fibromyalgia   . Emphysematous COPD   . Dyspnea on exertion   . Borderline type 2 diabetes mellitus   . History of suicidal ideation     2006   adx  . History of panic attacks   . History of esophageal dilatation   . History of chronic pancreatitis     severeal adx for this /  2008  dx alcoholic pancreatitis  . Bilateral ureteral calculi   .  Nephrolithiasis     bilateral   . History of kidney stones   . History of sepsis     adx 05-01-2014--  urosepsis due to kidney stones obstruction/ hydronephrosis  . BPH (benign prostatic hypertrophy)   . DDD (degenerative disc disease), cervical   . History of hiatal hernia    Past Surgical History  Procedure Laterality Date  . Wisdom tooth extraction    . Multiple extractions with alveoloplasty N/A 04/22/2014    Procedure: MULTIPLE EXTRACTIONS ( 2,3,5,6,7,8,9,10,11,13,14,15,21,28  WITH ALVEOLOPLASTY;  Surgeon: Ocie Doyne, DDS;  Location: [facility];  Service: Oral Surgery;  Laterality: N/A;  . Colonoscopy  10/08/2011    Jenkins:Normal colon/Anal condyloma without extension proximal to dentate line  . Flexible bronchoscopy N/A 08/12/2012    Procedure: FLEXIBLE BRONCHOSCOPY;  Surgeon: Fredirick Maudlin, MD;  Location: [facility];  Service: Pulmonary;  Laterality: N/A;  . Esophagogastroduodenoscopy (egd) with propofol N/A 11/12/2012    YQM:VHQION esophagus s/p  passage of a Maloney dilator and biopsy. Abnormal gastric mucosa-status post biopsy  . Maloney dilation N/A 11/12/2012    Procedure: MALONEY DILATION (54mm);  Surgeon: Corbin Ade, MD;  Location: [facility];  Service: Endoscopy;  Laterality: N/A;  . Esophageal biopsy  11/12/2012    Procedure: GASTRIC AND ESOPHAGEAL BIOPSIES;  Surgeon: Corbin Ade, MD;  Location: [facility];  Service: Endoscopy;;  .  Cystoscopy w/ ureteral stent placement Bilateral 05/01/2014    Procedure: CYSTOSCOPY WITH BILATERAL RETROGRADE PYELOGRAM; BILATERAL URETERAL STENT PLACEMENT;  Surgeon: Barron Alvine, MD;  Location: [facility];  Service: Urology;  Laterality: Bilateral;  . Electrocautery/ desiccation of condyloma lesions  01-15-2008  &  12-29-2009    PENIS, PERI-RECTAL , PERINUEM  . Cataract extraction w/ intraocular lens  implant, bilateral    . Cystoscopy with ureteroscopy and stent placement Bilateral 06/06/2014    Procedure: CYSTOSCOPY WITH J2 STENT EXTRACTION,,URETEROSCOPY  WITH EXTRACTION OF STONES,;  Surgeon: Marcine Matar, MD;  Location: [facility];  Service: Urology;  Laterality: Bilateral;  . Cystoscopy with ureteroscopy  06/06/2014    Procedure: CYSTOSCOPY WITH URETEROSCOPY;  Surgeon: Marcine Matar, MD;  Location: [facility];  Service: Urology;;  . Cystoscopy w/ ureteral stent removal Bilateral 06/06/2014    Procedure: CYSTOSCOPY WITH STENT REMOVAL;  Surgeon: Marcine Matar, MD;  Location[facility];  Service: Urology;  Laterality: Bilateral;   Family History  Problem Relation Age of Onset  . Hypertension Mother   . Breast cancer Mother   . Melanoma Mother   . Stroke Mother   . Lung cancer Father   . Lung cancer Father 74  . Colon cancer Neg Hx   . Colitis Neg Hx   . Cirrhosis Neg Hx   . Liver disease Neg Hx   . Pancreatic cancer Neg Hx   . Pancreatitis Neg Hx   . Anesthesia problems Neg Hx   . Hypotension Neg Hx   . Malignant hyperthermia Neg Hx   . Pseudochol deficiency Neg Hx    Social History  Substance Use Topics  . Smoking status: Current Every Day Smoker -- 1.00 packs/day for 20 years    Types: Cigarettes  . Smokeless tobacco: None  . Alcohol Use: Yes    Review of Systems  Gastrointestinal: Positive for nausea, vomiting and abdominal pain. Negative for diarrhea, constipation, hematochezia and hematemesis.  Genitourinary: Negative for hematuria.  Musculoskeletal: Positive for back pain.  All other systems reviewed and are negative.     Allergies  Review of patient's allergies indicates no known allergies.  Home Medications   Prior to Admission medications   Medication Sig Start Date End Date Taking? Authorizing Provider  ALPRAZolam Prudy Feeler) 1 MG tablet Take 1 mg by mouth 4 (four) times daily.   Yes Historical Provider, MD  HYDROcodone-acetaminophen (NORCO/VICODIN) 5-325 MG per tablet Take 1 tablet by mouth 3 (three) times daily.   Yes Historical Provider, MD   ketorolac (ACULAR) 0.4 % SOLN Place 1 drop into both eyes 4 (four) times daily. 06/24/14  Yes Historical Provider, MD  omeprazole (PRILOSEC) 40 MG capsule Take 40 mg by mouth at bedtime.    Yes Historical Provider, MD  zolpidem (AMBIEN) 10 MG tablet Take 10 mg by mouth at bedtime.   Yes Historical Provider, MD  ciprofloxacin (CIPRO) 250 MG tablet Take 1 tablet (250 mg total) by mouth 2 (two) times daily. Patient not taking: Reported on 07/08/2014 06/06/14   Marcine Matar, MD  HYDROcodone-acetaminophen [facility]) 10-325 MG per tablet Take 1-2 tablets by mouth every 6 (six) hours as needed for moderate pain. 05/03/14   Erick Blinks, MD  oxyCODONE-acetaminophen (PERCOCET) 10-325 MG per tablet Take 1-2 tablets by mouth every 4 (four) hours as needed for pain. Patient not taking: Reported on 07/08/2014 04/22/14   Ocie Doyne, DDS  polyethylene glycol (MIRALAX / GLYCOLAX) packet Take 17 g by mouth daily. Patient taking  differently: Take 17 g by mouth daily as needed for mild constipation.  05/03/14   Erick Blinks, MD  promethazine (PHENERGAN) 25 MG tablet Take 1 tablet (25 mg total) by mouth every 6 (six) hours as needed. Patient not taking: Reported on 07/08/2014 05/03/14   Erick Blinks, MD   BP 142/93 mmHg  Pulse 79  Temp(Src) 98.3 F (36.8 C) (Oral)  Resp 24  Ht  (1.727 m)  Wt 120 lb (54.432 kg)  BMI 18.25 kg/m2  SpO2 99% Physical Exam  Constitutional: He is oriented to person, place, and time. He appears well-developed and well-nourished.  Non-toxic appearance.  HENT:  Head: Normocephalic.  Right Ear: Tympanic membrane and external ear normal.  Left Ear: Tympanic membrane and external ear normal.  Eyes: EOM and lids are normal. Pupils are equal, round, and reactive to light.  Neck: Normal range of motion. Neck supple. Carotid bruit is not present.  Cardiovascular: Normal rate, regular rhythm, normal heart sounds, intact distal pulses and normal pulses.   Pulmonary/Chest: Breath  sounds normal. No respiratory distress.  Abdominal: Soft. Bowel sounds are normal. There is tenderness. There is guarding.  Musculoskeletal: Normal range of motion.  Lymphadenopathy:       Head (right side): No submandibular adenopathy present.       Head (left side): No submandibular adenopathy present.    He has no cervical adenopathy.  Neurological: He is alert and oriented to person, place, and time. He has normal strength. No cranial nerve deficit or sensory deficit.  Skin: Skin is warm and dry.  Psychiatric: He has a normal mood and affect. His speech is normal.  Nursing note and vitals reviewed.   ED Course  Procedures (including critical care time) Labs Review Labs Reviewed  LIPASE, BLOOD  COMPREHENSIVE METABOLIC PANEL  CBC  URINALYSIS, ROUTINE W REFLEX MICROSCOPIC (NOT AT [facility])    Imaging Review No results found. I have personally reviewed and evaluated these images and lab results as part of my medical decision-making.   EKG Interpretation None      MDM  Vital signs are well within normal limits. Patient received IV fluids and anti-medic, and IV pain medication prior to my arrival, but states he is still having rectal pain. The vomiting has improved. No audible or visual hallucinations noted. No evidence of alcohol withdrawal at this time.  Urinalysis was well within normal limits. Urine drug screen is positive for opiates and benzodiazepine's. The lipase is significantly elevated at 585. The conference of metabolic panel shows the potassium to be slightly low at 3.4, calcium to be low at 7.5, the albumen low at 3.3. The anion gap is within normal limits 7. The complete blood count shows the white blood cells to be elevated at 22,900. The lactic acid is within normal limits at 0.9. Chest x-ray shows possible pneumonia of the median right base. There is some emphysema changes are also present. IV Rocephin started.  Case discussed with the patient. Concern for the need  for an admission was discussed with the patient in terms which he understands, and he is in agreement with being admitted. Case discussed with the hospitalist. The patient will be admitted to telemetry. CT scan of the abdomen and pelvis is pending.    Final diagnoses:  None    **I have reviewed nursing notes, vital signs, and all appropriate lab and imaging results for this patient.Ivery Quale, PA-C 11/07/14 4540  Donnetta Hutching, MD 11/07/14 [phone]

## 2014-11-07 ENCOUNTER — Emergency Department (HOSPITAL_COMMUNITY): Payer: Medicare Other

## 2014-11-07 DIAGNOSIS — Z720 Tobacco use: Secondary | ICD-10-CM | POA: Diagnosis not present

## 2014-11-07 DIAGNOSIS — F1010 Alcohol abuse, uncomplicated: Secondary | ICD-10-CM | POA: Diagnosis present

## 2014-11-07 DIAGNOSIS — Z803 Family history of malignant neoplasm of breast: Secondary | ICD-10-CM | POA: Diagnosis not present

## 2014-11-07 DIAGNOSIS — J449 Chronic obstructive pulmonary disease, unspecified: Secondary | ICD-10-CM | POA: Diagnosis present

## 2014-11-07 DIAGNOSIS — I10 Essential (primary) hypertension: Secondary | ICD-10-CM | POA: Diagnosis present

## 2014-11-07 DIAGNOSIS — K859 Acute pancreatitis without necrosis or infection, unspecified: Secondary | ICD-10-CM | POA: Diagnosis present

## 2014-11-07 DIAGNOSIS — Z808 Family history of malignant neoplasm of other organs or systems: Secondary | ICD-10-CM | POA: Diagnosis not present

## 2014-11-07 DIAGNOSIS — G35 Multiple sclerosis: Secondary | ICD-10-CM | POA: Diagnosis present

## 2014-11-07 DIAGNOSIS — Z8249 Family history of ischemic heart disease and other diseases of the circulatory system: Secondary | ICD-10-CM | POA: Diagnosis not present

## 2014-11-07 DIAGNOSIS — N400 Enlarged prostate without lower urinary tract symptoms: Secondary | ICD-10-CM | POA: Diagnosis present

## 2014-11-07 DIAGNOSIS — F17210 Nicotine dependence, cigarettes, uncomplicated: Secondary | ICD-10-CM | POA: Diagnosis present

## 2014-11-07 DIAGNOSIS — J189 Pneumonia, unspecified organism: Secondary | ICD-10-CM | POA: Diagnosis not present

## 2014-11-07 DIAGNOSIS — M1990 Unspecified osteoarthritis, unspecified site: Secondary | ICD-10-CM | POA: Diagnosis present

## 2014-11-07 DIAGNOSIS — Z823 Family history of stroke: Secondary | ICD-10-CM | POA: Diagnosis not present

## 2014-11-07 DIAGNOSIS — K852 Alcohol induced acute pancreatitis without necrosis or infection: Secondary | ICD-10-CM | POA: Diagnosis present

## 2014-11-07 DIAGNOSIS — F410 Panic disorder [episodic paroxysmal anxiety] without agoraphobia: Secondary | ICD-10-CM | POA: Diagnosis present

## 2014-11-07 DIAGNOSIS — K219 Gastro-esophageal reflux disease without esophagitis: Secondary | ICD-10-CM | POA: Diagnosis present

## 2014-11-07 DIAGNOSIS — Z801 Family history of malignant neoplasm of trachea, bronchus and lung: Secondary | ICD-10-CM | POA: Diagnosis not present

## 2014-11-07 DIAGNOSIS — M797 Fibromyalgia: Secondary | ICD-10-CM | POA: Diagnosis present

## 2014-11-07 DIAGNOSIS — R1013 Epigastric pain: Secondary | ICD-10-CM | POA: Diagnosis not present

## 2014-11-07 LAB — CBC
HCT: 43 % (ref 39.0–52.0)
HEMOGLOBIN: 14 g/dL (ref 13.0–17.0)
MCH: 28.9 pg (ref 26.0–34.0)
MCHC: 32.6 g/dL (ref 30.0–36.0)
MCV: 88.8 fL (ref 78.0–100.0)
PLATELETS: 379 10*3/uL (ref 150–400)
RBC: 4.84 MIL/uL (ref 4.22–5.81)
RDW: 16.5 % — ABNORMAL HIGH (ref 11.5–15.5)
WBC: 15.9 10*3/uL — AB (ref 4.0–10.5)

## 2014-11-07 LAB — COMPREHENSIVE METABOLIC PANEL
ALK PHOS: 93 U/L (ref 38–126)
ALT: 15 U/L — AB (ref 17–63)
AST: 16 U/L (ref 15–41)
Albumin: 3.4 g/dL — ABNORMAL LOW (ref 3.5–5.0)
Anion gap: 7 (ref 5–15)
BUN: 7 mg/dL (ref 6–20)
CALCIUM: 8.1 mg/dL — AB (ref 8.9–10.3)
CHLORIDE: 104 mmol/L (ref 101–111)
CO2: 24 mmol/L (ref 22–32)
CREATININE: 0.53 mg/dL — AB (ref 0.61–1.24)
GFR calc Af Amer: >60 mL/min (ref >60)
Glucose, Bld: 125 mg/dL — ABNORMAL HIGH (ref 65–99)
Potassium: 3.5 mmol/L (ref 3.5–5.1)
SODIUM: 135 mmol/L (ref 135–145)
Total Bilirubin: 0.8 mg/dL (ref 0.3–1.2)
Total Protein: 6.1 g/dL — ABNORMAL LOW (ref 6.5–8.1)

## 2014-11-07 LAB — URINALYSIS, ROUTINE W REFLEX MICROSCOPIC
Bilirubin Urine: NEGATIVE
Glucose, UA: NEGATIVE mg/dL
Hgb urine dipstick: NEGATIVE
KETONES UR: NEGATIVE mg/dL
Leukocytes, UA: NEGATIVE
NITRITE: NEGATIVE
PH: 7.5 (ref 5.0–8.0)
Protein, ur: NEGATIVE mg/dL
Specific Gravity, Urine: 1.015 (ref 1.005–1.030)
UROBILINOGEN UA: 0.2 mg/dL (ref 0.0–1.0)

## 2014-11-07 LAB — RAPID URINE DRUG SCREEN, HOSP PERFORMED
Amphetamines: NOT DETECTED
Barbiturates: NOT DETECTED
Benzodiazepines: POSITIVE — AB
COCAINE: NOT DETECTED
OPIATES: POSITIVE — AB
Tetrahydrocannabinol: NOT DETECTED

## 2014-11-07 LAB — LACTIC ACID, PLASMA: Lactic Acid, Venous: 0.9 mmol/L (ref 0.5–2.0)

## 2014-11-07 LAB — LIPASE, BLOOD
Lipase: 585 U/L — ABNORMAL HIGH (ref 22–51)
Lipase: 647 U/L — ABNORMAL HIGH (ref 22–51)

## 2014-11-07 MED ORDER — DEXTROSE 5 % IV SOLN
1.0000 g | Freq: Once | INTRAVENOUS | Status: AC
  Administered 2014-11-07: 1 g via INTRAVENOUS
  Filled 2014-11-07: qty 10

## 2014-11-07 MED ORDER — THIAMINE HCL 100 MG/ML IJ SOLN: Freq: Once | INTRAVENOUS | Status: DC

## 2014-11-07 MED ORDER — LORAZEPAM 1 MG PO TABS
1.0000 mg | ORAL_TABLET | Freq: Four times a day (QID) | ORAL | Status: DC | PRN
  Administered 2014-11-07 – 2014-11-09 (×4): 1 mg via ORAL
  Filled 2014-11-07 (×4): qty 1

## 2014-11-07 MED ORDER — IOHEXOL 300 MG/ML  SOLN
100.0000 mL | Freq: Once | INTRAMUSCULAR | Status: AC | PRN
  Administered 2014-11-07: 100 mL via INTRAVENOUS

## 2014-11-07 MED ORDER — DEXTROSE 5 % IV SOLN
INTRAVENOUS | Status: AC
  Filled 2014-11-07: qty 500

## 2014-11-07 MED ORDER — IOHEXOL 300 MG/ML  SOLN
50.0000 mL | Freq: Once | INTRAMUSCULAR | Status: AC | PRN
  Administered 2014-11-07: 50 mL via ORAL

## 2014-11-07 MED ORDER — POTASSIUM CHLORIDE 10 MEQ/100ML IV SOLN
10.0000 meq | Freq: Once | INTRAVENOUS | Status: AC
  Administered 2014-11-07: 10 meq via INTRAVENOUS
  Filled 2014-11-07: qty 100

## 2014-11-07 MED ORDER — HYDROMORPHONE HCL 1 MG/ML IJ SOLN
1.0000 mg | Freq: Once | INTRAMUSCULAR | Status: AC
  Administered 2014-11-07: 1 mg via INTRAVENOUS
  Filled 2014-11-07: qty 1

## 2014-11-07 MED ORDER — ONDANSETRON HCL 4 MG PO TABS
4.0000 mg | ORAL_TABLET | Freq: Four times a day (QID) | ORAL | Status: DC | PRN
  Administered 2014-11-09: 4 mg via ORAL
  Filled 2014-11-07: qty 1

## 2014-11-07 MED ORDER — FOLIC ACID 1 MG PO TABS: 1.0000 mg | ORAL_TABLET | Freq: Every day | ORAL | Status: DC

## 2014-11-07 MED ORDER — ONDANSETRON HCL 4 MG/2ML IJ SOLN
4.0000 mg | Freq: Four times a day (QID) | INTRAMUSCULAR | Status: DC | PRN
  Administered 2014-11-07 – 2014-11-08 (×2): 4 mg via INTRAVENOUS
  Filled 2014-11-07: qty 2

## 2014-11-07 MED ORDER — PANTOPRAZOLE SODIUM 40 MG IV SOLR
40.0000 mg | INTRAVENOUS | Status: DC
  Administered 2014-11-07 – 2014-11-09 (×3): 40 mg via INTRAVENOUS
  Filled 2014-11-07 (×3): qty 40

## 2014-11-07 MED ORDER — SODIUM CHLORIDE 0.9 % IV SOLN
INTRAVENOUS | Status: DC
  Administered 2014-11-07 – 2014-11-09 (×5): via INTRAVENOUS

## 2014-11-07 MED ORDER — AZITHROMYCIN 500 MG IV SOLR
500.0000 mg | INTRAVENOUS | Status: DC
  Administered 2014-11-07 – 2014-11-08 (×2): 500 mg via INTRAVENOUS
  Filled 2014-11-07 (×3): qty 500

## 2014-11-07 MED ORDER — ADULT MULTIVITAMIN W/MINERALS CH: 1.0000 | ORAL_TABLET | Freq: Every day | ORAL | Status: DC

## 2014-11-07 MED ORDER — MORPHINE SULFATE (PF) 4 MG/ML IV SOLN
4.0000 mg | INTRAVENOUS | Status: DC | PRN
  Administered 2014-11-07 – 2014-11-09 (×4): 4 mg via INTRAVENOUS
  Filled 2014-11-07 (×4): qty 1

## 2014-11-07 MED ORDER — THIAMINE HCL 100 MG/ML IJ SOLN: INTRAVENOUS | Status: DC

## 2014-11-07 MED ORDER — MAGNESIUM SULFATE IN D5W 10-5 MG/ML-% IV SOLN
1.0000 g | Freq: Once | INTRAVENOUS | Status: AC
  Administered 2014-11-07: 1 g via INTRAVENOUS
  Filled 2014-11-07: qty 100

## 2014-11-07 MED ORDER — ENOXAPARIN SODIUM 40 MG/0.4ML ~~LOC~~ SOLN
40.0000 mg | SUBCUTANEOUS | Status: DC
  Administered 2014-11-07 – 2014-11-10 (×4): 40 mg via SUBCUTANEOUS
  Filled 2014-11-07 (×5): qty 0.4

## 2014-11-07 MED ORDER — PROCHLORPERAZINE EDISYLATE 5 MG/ML IJ SOLN
5.0000 mg | Freq: Once | INTRAMUSCULAR | Status: AC
  Administered 2014-11-07: 5 mg via INTRAVENOUS
  Filled 2014-11-07: qty 2

## 2014-11-07 MED ORDER — KETOROLAC TROMETHAMINE 0.5 % OP SOLN
1.0000 [drp] | Freq: Four times a day (QID) | OPHTHALMIC | Status: DC
  Administered 2014-11-07 – 2014-11-10 (×12): 1 [drp] via OPHTHALMIC
  Filled 2014-11-07: qty 5

## 2014-11-07 MED ORDER — MORPHINE SULFATE (PF) 2 MG/ML IV SOLN
2.0000 mg | INTRAVENOUS | Status: DC | PRN
  Administered 2014-11-07: 2 mg via INTRAVENOUS
  Filled 2014-11-07: qty 1

## 2014-11-07 MED ORDER — LORAZEPAM 2 MG/ML IJ SOLN
1.0000 mg | Freq: Four times a day (QID) | INTRAMUSCULAR | Status: DC | PRN
  Administered 2014-11-07 – 2014-11-08 (×3): 1 mg via INTRAVENOUS
  Filled 2014-11-07 (×3): qty 1

## 2014-11-07 MED ORDER — DEXTROSE 5 % IV SOLN
2.0000 g | INTRAVENOUS | Status: DC
  Administered 2014-11-07 – 2014-11-09 (×3): 2 g via INTRAVENOUS
  Filled 2014-11-07 (×4): qty 2

## 2014-11-07 NOTE — Progress Notes (Signed)
ANTIBIOTIC CONSULT NOTE - INITIAL  Pharmacy Consult for Rocephin Indication: pneumonia  No Known Allergies  Patient Measurements: Height: 5\' 8"  (172.7 cm) Weight: 119 lb 14.9 oz (54.4 kg) IBW/kg (Calculated) : 68.4 Adjusted Body Weight:   Vital Signs: Temp: 99.6 F (37.6 C) (09/26 0610) Temp Source: Oral (09/26 0610) BP: 137/95 mmHg (09/26 0610) Pulse Rate: 105 (09/26 0610) Intake/Output from previous day: 09/25 0701 - 09/26 0700 In: -  Out: 400 [Urine:400] Intake/Output from this shift:    Labs:  Recent Labs  11/06/14 2250 11/07/14 0615  WBC 22.9* 15.9*  HGB 13.5 14.0  PLT 388 379  CREATININE 0.65 0.53*   Estimated Creatinine Clearance: 89.7 mL/min (by C-G formula based on Cr of 0.53). No results for input(s): VANCOTROUGH, VANCOPEAK, VANCORANDOM, GENTTROUGH, GENTPEAK, GENTRANDOM, TOBRATROUGH, TOBRAPEAK, TOBRARND, AMIKACINPEAK, AMIKACINTROU, AMIKACIN in the last 72 hours.   Microbiology: No results found for this or any previous visit (from the past 720 hour(s)).  Medical History: Past Medical History  Diagnosis Date  . Retinal vasculitis   . Condylomata acuminata in male     multiple procedures --  penile, peri-rectal , perineum  . GERD (gastroesophageal reflux disease)   . Chronic back pain   . Multiple sclerosis     pt. states has 4 small brain lesions  . Arthritis   . Depression   . Hypertension     was on medication for short time, htn was caused by prednisone per pt  . Anxiety     Disabled due to panic attacks  . Headache(784.0)   . Fibromyalgia   . Emphysematous COPD   . Dyspnea on exertion   . Borderline type 2 diabetes mellitus   . History of suicidal ideation     2006   adx  . History of panic attacks   . History of esophageal dilatation   . History of chronic pancreatitis     severeal adx for this /  2008  dx alcoholic pancreatitis  . Bilateral ureteral calculi   . Nephrolithiasis     bilateral   . History of kidney stones   .  History of sepsis     adx 05-01-2014--  urosepsis due to kidney stones obstruction/ hydronephrosis  . BPH (benign prostatic hypertrophy)   . DDD (degenerative disc disease), cervical   . History of hiatal hernia     Medications:  Scheduled:  . azithromycin  500 mg Intravenous Q24H  . enoxaparin (LOVENOX) injection  40 mg Subcutaneous Q24H  . ketorolac  1 drop Both Eyes QID  . pantoprazole (PROTONIX) IV  40 mg Intravenous Q24H   Assessment: 44 yo male with N/V and severe abdominal pain. Chest x-ray show possible pneumonia. Low grade fever, leukocytosis. Treatment of CAP  Goal of Therapy:  resolution of PNA  Plan:  Rocephin 2gm IV q24h along with IV zithromax Monitor labs Follow up culture results  Dutch Quint, Lorie L 11/07/2014,8:57 AM

## 2014-11-07 NOTE — Progress Notes (Signed)
Patient was admitted by Dr. Sharl Ma earlier today  Patient seen and examined.  He has been admitted to the hospital with acute alcoholic pancreatitis. Initially treated with bowel rest, IV fluids and pain management. He's been advised to refrain from any further alcohol. He's also been placed on CIWA protocol. He does not have any signs of active withdrawal at this time. Chest x-ray also indicates pneumonia and he is on antibiotic. Continue current treatments.  MEMON,JEHANZEB

## 2014-11-07 NOTE — H&P (Signed)
PCP:   Milana Obey, MD   Chief Complaint:  Nausea and vomiting  HPI:  44 year old male who  has a past medical history of Retinal vasculitis; Condylomata acuminata in male; GERD (gastroesophageal reflux disease); Chronic back pain; Multiple sclerosis; Arthritis; Depression; Hypertension; Anxiety; Headache(784.0); Fibromyalgia; Emphysematous COPD; Dyspnea on exertion; Borderline type 2 diabetes mellitus; History of suicidal ideation; History of panic attacks; History of esophageal dilatation; History of chronic pancreatitis; Bilateral ureteral calculi; Nephrolithiasis; History of kidney stones; History of sepsis; BPH (benign prostatic hypertrophy); DDD (degenerative disc disease), cervical; and History of hiatal hernia. *Today comes to the hospital with nausea and vomiting. Patient drank 18 beers yesterday. Patient has a history of alcohol abuse and has been drinking heavily for past 3 years. He also complains of severe 10/10 abdominal pain. Found to have elevated lipase in the ED. Chest x-ray also showed possible pneumonia. Started on IV Rocephin. CT abdomen and pelvis is pending. He denies chest pain, no shortness of breath. Complains of nausea and vomiting. No diarrhea, no dysuria urgency frequency of urination.  Allergies:  No Known Allergies    Past Medical History  Diagnosis Date  . Retinal vasculitis   . Condylomata acuminata in male     multiple procedures --  penile, peri-rectal , perineum  . GERD (gastroesophageal reflux disease)   . Chronic back pain   . Multiple sclerosis     pt. states has 4 small brain lesions  . Arthritis   . Depression   . Hypertension     was on medication for short time, htn was caused by prednisone per pt  . Anxiety     Disabled due to panic attacks  . Headache(784.0)   . Fibromyalgia   . Emphysematous COPD   . Dyspnea on exertion   . Borderline type 2 diabetes mellitus   . History of suicidal ideation     2006   adx  . History of  panic attacks   . History of esophageal dilatation   . History of chronic pancreatitis     severeal adx for this /  2008  dx alcoholic pancreatitis  . Bilateral ureteral calculi   . Nephrolithiasis     bilateral   . History of kidney stones   . History of sepsis     adx 05-01-2014--  urosepsis due to kidney stones obstruction/ hydronephrosis  . BPH (benign prostatic hypertrophy)   . DDD (degenerative disc disease), cervical   . History of hiatal hernia     Past Surgical History  Procedure Laterality Date  . Wisdom tooth extraction    . Multiple extractions with alveoloplasty N/A 04/22/2014    Procedure: MULTIPLE EXTRACTIONS ( 2,3,5,6,7,8,9,10,11,13,14,15,21,28  WITH ALVEOLOPLASTY;  Surgeon: Ocie Doyne, DDS;  Location: [facility];  Service: Oral Surgery;  Laterality: N/A;  . Colonoscopy  10/08/2011    Jenkins:Normal colon/Anal condyloma without extension proximal to dentate line  . Flexible bronchoscopy N/A 08/12/2012    Procedure: FLEXIBLE BRONCHOSCOPY;  Surgeon: Fredirick Maudlin, MD;  Location: [facility];  Service: Pulmonary;  Laterality: N/A;  . 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 Abnormal gastric mucosa-status post biopsy  . Maloney dilation N/A 11/12/2012    Procedure: MALONEY DILATION (76mm);  Surgeon: Corbin Ade, MD;  Location: [facility];  Service: Endoscopy;  Laterality: N/A;  . Esophageal biopsy  11/12/2012    Procedure: GASTRIC AND ESOPHAGEAL BIOPSIES;  Surgeon: Gerrit Friends  Rourk, MD;  Location: [facility];  Service: Endoscopy;;  . Cystoscopy w/ ureteral stent placement Bilateral 05/01/2014    Procedure: CYSTOSCOPY WITH BILATERAL RETROGRADE PYELOGRAM; BILATERAL URETERAL STENT PLACEMENT;  Surgeon: Barron Alvine, MD;  Location: [facility];  Service: Urology;  Laterality: Bilateral;  . Electrocautery/ desiccation of condyloma lesions  01-15-2008  &  12-29-2009    PENIS, PERI-RECTAL , PERINUEM  . Cataract  extraction w/ intraocular lens  implant, bilateral    . Cystoscopy with ureteroscopy and stent placement Bilateral 06/06/2014    Procedure: CYSTOSCOPY WITH J2 STENT EXTRACTION,,URETEROSCOPY WITH EXTRACTION OF STONES,;  Surgeon: Marcine Matar, MD;  Location: [facility];  Service: Urology;  Laterality: Bilateral;  . Cystoscopy with ureteroscopy  06/06/2014    Procedure: CYSTOSCOPY WITH URETEROSCOPY;  Surgeon: Marcine Matar, MD;  Location: [facility];  Service: Urology;;  . Cystoscopy w/ ureteral stent removal Bilateral 06/06/2014    Procedure: CYSTOSCOPY WITH STENT REMOVAL;  Surgeon: Marcine Matar, MD;  Location: [facility];  Service: Urology;  Laterality: Bilateral;    Prior to Admission medications   Medication Sig Start Date End Date Taking? Authorizing Provider  ALPRAZolam Prudy Feeler) 1 MG tablet Take 1 mg by mouth 4 (four) times daily.   Yes Historical Provider, MD  HYDROcodone-acetaminophen (NORCO/VICODIN) 5-325 MG per tablet Take 1 tablet by mouth 3 (three) times daily.   Yes Historical Provider, MD  ketorolac (ACULAR) 0.4 % SOLN Place 1 drop into both eyes 4 (four) times daily. 06/24/14  Yes Historical Provider, MD  omeprazole (PRILOSEC) 40 MG capsule Take 40 mg by mouth at bedtime.    Yes Historical Provider, MD  zolpidem (AMBIEN) 10 MG tablet Take 10 mg by mouth at bedtime.   Yes Historical Provider, MD  ciprofloxacin (CIPRO) 250 MG tablet Take 1 tablet (250 mg total) by mouth 2 (two) times daily. Patient not taking: Reported on 07/08/2014 06/06/14   Marcine Matar, MD  HYDROcodone-acetaminophen [facility]) 10-325 MG per tablet Take 1-2 tablets by mouth every 6 (six) hours as needed for moderate pain. 05/03/14   Erick Blinks, MD  oxyCODONE-acetaminophen (PERCOCET) 10-325 MG per tablet Take 1-2 tablets by mouth every 4 (four) hours as needed for pain. Patient not taking: Reported on 07/08/2014 04/22/14   Ocie Doyne, DDS  polyethylene  glycol (MIRALAX / GLYCOLAX) packet Take 17 g by mouth daily. Patient taking differently: Take 17 g by mouth daily as needed for mild constipation.  05/03/14   Erick Blinks, MD  promethazine (PHENERGAN) 25 MG tablet Take 1 tablet (25 mg total) by mouth every 6 (six) hours as needed. Patient not taking: Reported on 07/08/2014 05/03/14   Erick Blinks, MD    Social History:  reports that he has been smoking Cigarettes.  He has a 20 pack-year smoking history. He does not have any smokeless tobacco history on file. He reports that he drinks alcohol. He reports that he does not use illicit drugs.  Family History  Problem Relation Age of Onset  . Hypertension Mother   . Breast cancer Mother   . Melanoma Mother   . Stroke Mother   . Lung cancer Father   . Lung cancer Father 108  . Colon cancer Neg Hx   . Colitis Neg Hx   . Cirrhosis Neg Hx   . Liver disease Neg Hx   . Pancreatic cancer Neg Hx   . Pancreatitis Neg Hx   . Anesthesia problems Neg Hx   . Hypotension Neg Hx   .  Malignant hyperthermia Neg Hx   . Pseudochol deficiency Neg Hx     Filed Weights   11/06/14 2200  Weight: 54.432 kg (120 lb)    All the positives are listed in BOLD  Review of Systems:  HEENT: Headache, blurred vision, runny nose, sore throat Neck: Hypothyroidism, hyperthyroidism,,lymphadenopathy Chest : Shortness of breath, history of COPD, Asthma Heart : Chest pain, history of coronary arterey disease GI:  Nausea, vomiting, diarrhea, constipation, GERD GU: Dysuria, urgency, frequency of urination, hematuria Neuro: Stroke, seizures, syncope, multiple sclerosis Psych: Depression, anxiety, hallucinations   Physical Exam: Blood pressure 145/101, pulse 83, temperature 98.3 F (36.8 C), temperature source Oral, resp. rate 18, height  (1.727 m), weight 54.432 kg (120 lb), SpO2 96 %. Constitutional:   Patient is a well-developed and well-nourished *male in no acute distress and cooperative with exam. Head:  Normocephalic and atraumatic Mouth: Mucus membranes moist Eyes: PERRL, EOMI, conjunctivae normal Neck: Supple, No Thyromegaly Cardiovascular: RRR, S1 normal, S2 normal Pulmonary/Chest: CTAB, no wheezes, rales, or rhonchi Abdominal: Soft. Positive epigastric tenderness to palpation, non-distended, bowel sounds are normal, no masses, organomegaly, or guarding present.  Neurological: A&O x3, Strength is normal and symmetric bilaterally, cranial nerve II-XII are grossly intact, no focal motor deficit, sensory intact to light touch bilaterally.  Extremities : No Cyanosis, Clubbing or Edema  Labs on Admission:  Basic Metabolic Panel:  Recent Labs Lab 11/06/14 2250  NA 141  K 3.4*  CL 108  CO2 26  GLUCOSE 102*  BUN 10  CREATININE 0.65  CALCIUM 7.5*   Liver Function Tests:  Recent Labs Lab 11/06/14 2250  AST 16  ALT 17  ALKPHOS 87  BILITOT 0.8  PROT 5.7*  ALBUMIN 3.3*    Recent Labs Lab 11/06/14 2250  LIPASE 585*   No results for input(s): AMMONIA in the last 168 hours. CBC:  Recent Labs Lab 11/06/14 2250  WBC 22.9*  HGB 13.5  HCT 41.5  MCV 89.4  PLT 388    Radiological Exams on Admission: Dg Chest Portable 1 View  11/06/2014   CLINICAL DATA:  Epigastric pain.  History pancreatitis  EXAM: PORTABLE CHEST 1 VIEW  COMPARISON:  01/16/2014  FINDINGS: Severe emphysematous change. Hazy density at the medial right base, seen previously but more prominent. Linear scar at the left apex. Normal heart size and stable mediastinal contours.  IMPRESSION: 1. Possible pneumonia at the medial right base. Followup PA and lateral chest X-ray is recommended in 3-4 weeks to ensure resolution. 2. Emphysema and lung scarring.   Electronically Signed   By: Marnee Spring M.D.   On: 11/06/2014 23:38      Assessment/Plan Active Problems:   Epigastric pain   Pancreatitis   ETOH abuse   Tobacco abuse   Acute alcoholic pancreatitis   Acute pancreatitis  Acute appendicitis Patient  has a history of alcohol abuse, likely has alcohol induced acute pancreatitis Will keep nothing by mouth, IV fluids. Zofran. For nausea vomiting. Morphine. For pain. Lipase elevated to 585, will repeat lipase in a.m.  Alcohol abuse Will start patient on CIWA protocol Folate and thiamine  Leukocytosis/pneumonia WBC 22,000 Chest x-ray shows possible pneumonia patient started on Rocephin. Will also add Zithromax 500 mg IV daily.   DVT prophylaxis Lovenox  Code status: Full code  Family discussion: Admission, patients condition and plan of care including tests being ordered have been discussed with the patient and *his mother at bedside* who indicate understanding and agree with the plan and Code  Status.   Time Spent on Admission: 60 min  LAMA,GAGAN S [facility] Pager: [phone] 11/07/2014, 1:21 AM  If 7PM-7AM, please contact night-coverage  [email or web address]  Password [email or web address]

## 2014-11-08 LAB — CBC
HCT: 40.5 % (ref 39.0–52.0)
Hemoglobin: 13.2 g/dL (ref 13.0–17.0)
MCH: 28.8 pg (ref 26.0–34.0)
MCHC: 32.6 g/dL (ref 30.0–36.0)
MCV: 88.4 fL (ref 78.0–100.0)
PLATELETS: 369 10*3/uL (ref 150–400)
RBC: 4.58 MIL/uL (ref 4.22–5.81)
RDW: 16.2 % — AB (ref 11.5–15.5)
WBC: 11.9 10*3/uL — AB (ref 4.0–10.5)

## 2014-11-08 LAB — COMPREHENSIVE METABOLIC PANEL
ALT: 14 U/L — AB (ref 17–63)
AST: 13 U/L — AB (ref 15–41)
Albumin: 3.3 g/dL — ABNORMAL LOW (ref 3.5–5.0)
Alkaline Phosphatase: 77 U/L (ref 38–126)
Anion gap: 8 (ref 5–15)
BUN: 10 mg/dL (ref 6–20)
CHLORIDE: 105 mmol/L (ref 101–111)
CO2: 22 mmol/L (ref 22–32)
CREATININE: 0.6 mg/dL — AB (ref 0.61–1.24)
Calcium: 8 mg/dL — ABNORMAL LOW (ref 8.9–10.3)
GFR calc non Af Amer: >60 mL/min (ref >60)
Glucose, Bld: 74 mg/dL (ref 65–99)
POTASSIUM: 3.4 mmol/L — AB (ref 3.5–5.1)
SODIUM: 135 mmol/L (ref 135–145)
Total Bilirubin: 1.4 mg/dL — ABNORMAL HIGH (ref 0.3–1.2)
Total Protein: 6.1 g/dL — ABNORMAL LOW (ref 6.5–8.1)

## 2014-11-08 LAB — LIPASE, BLOOD: Lipase: 125 U/L — ABNORMAL HIGH (ref 22–51)

## 2014-11-08 MED ORDER — AZITHROMYCIN 250 MG PO TABS: 500.0000 mg | ORAL_TABLET | Freq: Every day | ORAL | Status: DC

## 2014-11-08 MED ORDER — ACETAMINOPHEN 325 MG PO TABS
650.0000 mg | ORAL_TABLET | Freq: Four times a day (QID) | ORAL | Status: DC | PRN
  Administered 2014-11-08 – 2014-11-10 (×3): 650 mg via ORAL
  Filled 2014-11-08 (×3): qty 2

## 2014-11-08 MED ORDER — POTASSIUM CHLORIDE CRYS ER 20 MEQ PO TBCR
40.0000 meq | EXTENDED_RELEASE_TABLET | Freq: Once | ORAL | Status: AC
  Administered 2014-11-08: 40 meq via ORAL
  Filled 2014-11-08: qty 2

## 2014-11-08 MED ORDER — DEXTROSE 5 % IV SOLN
500.0000 mg | INTRAVENOUS | Status: DC
  Administered 2014-11-09: 500 mg via INTRAVENOUS
  Filled 2014-11-08 (×2): qty 500

## 2014-11-08 NOTE — Progress Notes (Signed)
[facility] PROGRESS NOTE  Brent [name] BJY:[id number] DOB: [birth year] DOA: 11/06/2014 PCP: Milana Obey, MD  Summary:  This is a 44 year old male who presented to the hospital with complaints of nausea and vomiting as well as abdominal pain. His son have alcohol-induced pancreatitis as well as evidence of any acquired pneumonia. He is placed on bowel rest, IV fluids, pain management and antibiotics. He is also on CIWA protocol for any developing alcohol withdrawal. Anticipate he'll be in the hospital another 1-2 days.  Assessment/Plan: 1. Acute alcoholic pancreatitis. Abdominal pain appears to be mildly improving. Lipase is trending down. He has been kept nothing by mouth since admission. Will advance to clear liquids. Continue IV fluids and pain management.   2. Epigastric pain, secondary to pancreatitis. Continue pain management. 3. Community-acquired pneumonia. Currently on Rocephin and azithromycin. Patient has been afebrile and leukocytosis is improving. Lactic acid normal upon admission.  4. EtOH abuse. He does not have signs of withdrawal at this time. Counseled on alcohol cessation. Placed on CIWA protocol. Continue to monitor. 5. Tobacco abuse. Counseled on cessation.     Code Status: Full DVT prophylaxis: Lovenox Family Communication: discussed with patient Disposition Plan: discharge home in 1-2 days   Consultants:    Procedures:    Antibiotics:  Azithromycin 9/26>>  Ceftriaxone 9/26>>  HPI/Subjective: Continues to complain of pain in abdomen. No nausea or vomiting. Has headache that he feels is related to morphine.  Objective: Filed Vitals:   11/07/14 2110  BP: 139/96  Pulse: 100  Temp: 98.4 F (36.9 C)  Resp: 18    Intake/Output Summary (Last 24 hours) at 11/08/14 0657 Last data filed at 11/08/14 0500  Gross per 24 hour  Intake 1312.5 ml  Output      0 ml  Net 1312.5 ml   Filed Weights   11/06/14 2200 11/07/14 0610  Weight: 54.432  kg (120 lb) 54.4 kg (119 lb 14.9 oz)    Exam: General: NAD, looks comfortable Cardiovascular: RRR, S1, S2  Respiratory: clear bilaterally, No wheezing, rales or rhnochi Abdomen: soft, epigastric tenderness, no distention , bowel sounds normal Musculoskeletal: No edema b/l  Data Reviewed: Basic Metabolic Panel:  Recent Labs Lab 11/06/14 2250 11/07/14 0615 11/08/14 0457  NA 141 135 135  K 3.4* 3.5 3.4*  CL 108 104 105  CO2 GLUCOSE 102* 125* 74  BUN CREATININE 0.65 0.53* 0.60*  CALCIUM 7.5* 8.1* 8.0*   Liver Function Tests:  Recent Labs Lab 11/06/14 2250 11/07/14 0615 11/08/14 0457  AST 16 16 13*  ALT 17 15* 14*  ALKPHOS 87 93 77  BILITOT 0.8 0.8 1.4*  PROT 5.7* 6.1* 6.1*  ALBUMIN 3.3* 3.4* 3.3*    Recent Labs Lab 11/06/14 2250 11/07/14 0615 11/08/14 0457  LIPASE 585* 647* 125*   No results for input(s): AMMONIA in the last 168 hours. CBC:  Recent Labs Lab 11/06/14 2250 11/07/14 0615 11/08/14 0457  WBC 22.9* 15.9* 11.9*  HGB 13.5 14.0 13.2  HCT 41.5 43.0 40.5  MCV 89.4 88.8 88.4  PLT 388 379 369   Cardiac Enzymes: No results for input(s): CKTOTAL, CKMB, CKMBINDEX, TROPONINI in the last 168 hours. BNP (last 3 results) No results for input(s): BNP in the last 8760 hours.  ProBNP (last 3 results) No results for input(s): PROBNP in the last 8760 hours.  CBG: No results for input(s): GLUCAP in the last 168 hours.  No results found for this or any previous  visit (from the past 240 hour(s)).   Studies: Ct Abdomen Pelvis W Contrast  11/07/2014   CLINICAL DATA:  Abdominal pain, nausea and vomiting. Pancreatitis. Possible lower lung pneumonia on radiograph.  EXAM: CT ABDOMEN AND PELVIS WITH CONTRAST  TECHNIQUE: Multidetector CT imaging of the abdomen and pelvis was performed using the standard protocol following bolus administration of intravenous contrast.  CONTRAST:  50mL OMNIPAQUE IOHEXOL 300 MG/ML SOLN, OMNIPAQUE IOHEXOL  300 MG/ML SOLN  COMPARISON:  Chest radiograph 1 day prior.  CT 04/2014  FINDINGS: Lower chest: There is dependent atelectasis in both lower lobes. This is more pronounced in the medial right lower lobe, likely accounting the appearance of radiograph. No confluent airspace disease. No pleural effusion.  Liver: No focal lesion.  Hepatobiliary: Gallbladder physiologically distended, no calcified stone. No biliary dilatation.  Pancreas: Diffuse peripancreatic fat stranding from the uncinate process through the tail. Associated peripancreatic fluid tracks in the lesser sac and left pericolic gutter. There is homogeneous pancreatic enhancement without findings of necrosis, pseudocyst or abscess. The splenic vein remains patent.  Spleen: Normal.  Adrenal glands: No nodule.  Kidneys: Symmetric renal enhancement. No hydronephrosis. Tiny subcentimeter cysts in the interpolar right and left kidney. Previous bilateral renal stones are not definitively seen.  Stomach/Bowel: Stomach physiologically distended. Mild walled wall thickening, likely related to pancreatic inflammation. There are no dilated or thickened small bowel loops. Small volume of stool throughout the colon without colonic wall thickening. The appendix is not confidently identified.  Vascular/Lymphatic: No retroperitoneal adenopathy. Abdominal aorta is normal in caliber. There is mild atherosclerosis, advanced for age.  Reproductive: Prostate gland is prominent for age.  Bladder: Distended without wall thickening.  Other: No pneumoperitoneum.  No pelvic free fluid.  Musculoskeletal: There are no acute or suspicious osseous abnormalities. Chronic deformity of L2 and stable degenerative chain show at L4-L5.  IMPRESSION: 1. Acute pancreatitis with moderate peripancreatic inflammatory change and free fluid tracking along the distal pancreas and left pericolic gutter. No pseudocyst formation. 2. Atelectasis at the lung bases, right greater than left. This likely  accounts for the appearance on radiograph, no consolidation to suggest pneumonia.   Electronically Signed   By: Rubye Oaks M.D.   On: 11/07/2014 01:46   Dg Chest Portable 1 View  11/06/2014   CLINICAL DATA:  Epigastric pain.  History pancreatitis  EXAM: PORTABLE CHEST 1 VIEW  COMPARISON:  01/16/2014  FINDINGS: Severe emphysematous change. Hazy density at the medial right base, seen previously but more prominent. Linear scar at the left apex. Normal heart size and stable mediastinal contours.  IMPRESSION: 1. Possible pneumonia at the medial right base. Followup PA and lateral chest X-ray is recommended in 3-4 weeks to ensure resolution. 2. Emphysema and lung scarring.   Electronically Signed   By: Marnee Spring M.D.   On: 11/06/2014 23:38    Scheduled Meds: . azithromycin  500 mg Intravenous Q24H  . cefTRIAXone (ROCEPHIN)  IV  2 g Intravenous Q24H  . enoxaparin (LOVENOX) injection  40 mg Subcutaneous Q24H  . ketorolac  1 drop Both Eyes QID  . pantoprazole (PROTONIX) IV  40 mg Intravenous Q24H   Continuous Infusions: . sodium chloride 150 mL/hr at 11/08/14 0153    Active Problems:   Epigastric pain   Pancreatitis   ETOH abuse   Tobacco abuse   Acute alcoholic pancreatitis   Acute pancreatitis   CAP (community acquired pneumonia)    Time spent:    Erick Blinks, MD  [facility] Pager [phone]. If 7PM-7AM, please contact night-coverage at [email or web address], password [facility] 11/08/2014, 6:57 AM  LOS: 1 day

## 2014-11-08 NOTE — Care Management Note (Signed)
Case Management Note  Patient Details  Name: Brent [name] MRN: [id number] Date of Birth: Apr 20, 1970  Subjective/Objective:                  Pt admitted from home with pancreatitis. Pt lives with family and will return home at discharge. Pt is independent with ADl's.  Action/Plan: No CM needs noted.  Expected Discharge Date:  11/09/14               Expected Discharge Plan:  Home/Self Care  In-House Referral:  NA  Discharge planning Services  CM Consult  Post Acute Care Choice:  NA Choice offered to:  NA  DME Arranged:    DME Agency:     HH Arranged:    HH Agency:     Status of Service:  Completed, signed off  Medicare Important Message Given:    Date Medicare IM Given:    Medicare IM give by:    Date Additional Medicare IM Given:    Additional Medicare Important Message give by:     If discussed at Long Length of Stay Meetings, dates discussed:    Additional Comments:  Cheryl Flash, RN 11/08/2014, 12:53 PM

## 2014-11-09 DIAGNOSIS — Z720 Tobacco use: Secondary | ICD-10-CM

## 2014-11-09 DIAGNOSIS — K852 Alcohol induced acute pancreatitis: Principal | ICD-10-CM

## 2014-11-09 DIAGNOSIS — F1010 Alcohol abuse, uncomplicated: Secondary | ICD-10-CM

## 2014-11-09 LAB — COMPREHENSIVE METABOLIC PANEL
ALBUMIN: 2.8 g/dL — AB (ref 3.5–5.0)
ALK PHOS: 69 U/L (ref 38–126)
ALT: 12 U/L — ABNORMAL LOW (ref 17–63)
ANION GAP: 4 — AB (ref 5–15)
AST: 14 U/L — ABNORMAL LOW (ref 15–41)
BUN: 7 mg/dL (ref 6–20)
CALCIUM: 8.2 mg/dL — AB (ref 8.9–10.3)
CHLORIDE: 110 mmol/L (ref 101–111)
CO2: 24 mmol/L (ref 22–32)
Creatinine, Ser: 0.52 mg/dL — ABNORMAL LOW (ref 0.61–1.24)
GFR calc non Af Amer: >60 mL/min (ref >60)
GLUCOSE: 88 mg/dL (ref 65–99)
Potassium: 3.7 mmol/L (ref 3.5–5.1)
SODIUM: 138 mmol/L (ref 135–145)
Total Bilirubin: 0.6 mg/dL (ref 0.3–1.2)
Total Protein: 5.5 g/dL — ABNORMAL LOW (ref 6.5–8.1)

## 2014-11-09 LAB — CBC
HEMATOCRIT: 35 % — AB (ref 39.0–52.0)
HEMOGLOBIN: 11.4 g/dL — AB (ref 13.0–17.0)
MCH: 28.7 pg (ref 26.0–34.0)
MCHC: 32.6 g/dL (ref 30.0–36.0)
MCV: 88.2 fL (ref 78.0–100.0)
Platelets: 316 10*3/uL (ref 150–400)
RBC: 3.97 MIL/uL — AB (ref 4.22–5.81)
RDW: 15.9 % — ABNORMAL HIGH (ref 11.5–15.5)
WBC: 6.1 10*3/uL (ref 4.0–10.5)

## 2014-11-09 LAB — LIPASE, BLOOD: LIPASE: 114 U/L — AB (ref 22–51)

## 2014-11-09 MED ORDER — HYDROCODONE-ACETAMINOPHEN 10-325 MG PO TABS
1.0000 | ORAL_TABLET | Freq: Four times a day (QID) | ORAL | Status: DC | PRN
  Administered 2014-11-09: 1 via ORAL
  Administered 2014-11-10: 2 via ORAL
  Filled 2014-11-09: qty 1
  Filled 2014-11-09: qty 2

## 2014-11-09 MED ORDER — HYDROMORPHONE HCL 1 MG/ML IJ SOLN: 0.5000 mg | INTRAMUSCULAR | Status: DC | PRN

## 2014-11-09 MED ORDER — PANTOPRAZOLE SODIUM 40 MG PO TBEC
40.0000 mg | DELAYED_RELEASE_TABLET | Freq: Every day | ORAL | Status: DC
  Administered 2014-11-09 – 2014-11-10 (×2): 40 mg via ORAL
  Filled 2014-11-09 (×2): qty 1

## 2014-11-09 MED ORDER — ALPRAZOLAM 1 MG PO TABS
1.0000 mg | ORAL_TABLET | Freq: Four times a day (QID) | ORAL | Status: DC
  Administered 2014-11-09 – 2014-11-10 (×4): 1 mg via ORAL
  Filled 2014-11-09 (×4): qty 1

## 2014-11-09 MED ORDER — HYDROCODONE-ACETAMINOPHEN 5-325 MG PO TABS: 1.0000 | ORAL_TABLET | Freq: Four times a day (QID) | ORAL | Status: DC | PRN

## 2014-11-09 NOTE — Progress Notes (Signed)
PROGRESS NOTE  Brent [name]:[id number] DOB: 28-May-1970 DOA: 11/06/2014 PCP: Milana Obey, MD  Summary: 44 yo male with PMH of chronic pancreatitis, GERD, alcohol abuse and hypertension who presented to the ED with abdominal pain, nausea and vomiting. Patient stated that he drank 18 beers a day prior to coming into the hospital. While in the ED CXR revealed possible pneumonia at the medial right base. Labs revealed elevated lipase and moderate leukocytosis. Abdominal CT consistent with acute pancreatitis. Patient was admitted for further evaluation.  Assessment/Plan: 1. Acute alcoholic pancreatitis. Abdominal pain continues to improve. Lipase is trending down at 114 today. Somewhat tolerating liquid diet, reports nausea. 2. Epigastric pain, secondary to pancreatitis, slightly improving. 3. Community-acquired pneumonia was considered on admission but patient has been afebrile and leukocytosis has resolved. Lactic acid WNL. F/u CT showed no evidenc eof pneumonia.  4. EtOH abuse. He does not have signs of withdrawal at this time. Counseled on alcohol cessation. Placed on CIWA protocol. Continue to monitor. 5. Tobacco abuse. Counseled on importance of cessation. 6. Hypertension. Stable 7. Borderline type 2 DM 8. Anxiety    Overall improved  Continue on clear liquid diet advance as tolerated  Anticipate discharge 1-2 days  Code Status: Full DVT prophylaxis: Lovenox Family Communication: discussed with patient Disposition Plan: discharge home in 1-2 days  Brendia Sacks, MD  [facility]  Pager [phone] If 7PM-7AM, please contact night-coverage at [email or web address], password [facility] 11/09/2014, 7:00 AM  LOS: 2 days   Consultants:    Procedures:    Antibiotics:  Azithromycin 9/26>> 9/28  Ceftriaxone 9/26>> 9/28  HPI/Subjective: Feeling better. Somewhat  tolerating clear liquid diet . Reports nausea and episodes of vomiting last night. Abdominal pain 5-6/10 but  is 50% better. Mild cough which he reports has been present for a few days. No SOB.  Objective: Filed Vitals:   11/08/14 1046 11/08/14 1421 11/08/14 2258 11/09/14 0557  BP: 118/78 120/81 111/76 104/70  Pulse: 94 85 79 72  Temp: 99.5 F (37.5 C) 98.1 F (36.7 C) 98.7 F (37.1 C) 98.4 F (36.9 C)  TempSrc: Oral Oral Oral Oral  Resp: Height:      Weight:      SpO2:  98% 96% 93%    Intake/Output Summary (Last 24 hours) at 11/09/14 0700 Last data filed at 11/08/14 1849  Gross per 24 hour  Intake 2412.5 ml  Output      0 ml  Net 2412.5 ml     Filed Weights   11/06/14 2200 11/07/14 0610  Weight: 54.432 kg (120 lb) 54.4 kg (119 lb 14.9 oz)    Exam:  General:  Appears calm and comfortable Cardiovascular: RRR, no m/r/g. No LE edema. Telemetry: SR, no arrhythmias  Respiratory: CTA bilaterally, no w/r/r. Normal respiratory effort. Abdomen: soft, mild epigastric pain to palpation. Musculoskeletal: grossly normal tone BUE/BLE Psychiatric: grossly normal mood and affect, speech fluent and appropriate Neurologic: grossly non-focal.  New data reviewed:  Lipase improved at 114, CMP otherwise unremarkable   WBC 6.1  CBC unremarkable  Pertinent data since admission:  CXR: 1. Possible pneumonia at the medial right base. Followup PA and lateral chest X-ray is recommended in 3-4 weeks to ensure resolution. 2. Emphysema and lung scarring.  CT Abdomen/Pelvis: 1. Acute pancreatitis with moderate peripancreatic inflammatory change and free fluid tracking along the distal pancreas and left pericolic gutter. No pseudocyst formation. 2. Atelectasis at the lung bases, right greater than left. This likely accounts for the  appearance on radiograph, no consolidation to suggest pneumonia.  Lipase, 647    Pending data:    Scheduled Meds: . azithromycin  500 mg Intravenous Q24H  . cefTRIAXone (ROCEPHIN)  IV  2 g Intravenous Q24H  . enoxaparin (LOVENOX) injection  40 mg  Subcutaneous Q24H  . ketorolac  1 drop Both Eyes QID  . pantoprazole (PROTONIX) IV  40 mg Intravenous Q24H   Continuous Infusions: . sodium chloride 150 mL/hr at 11/08/14 2115    Active Problems:   Epigastric pain   Pancreatitis   ETOH abuse   Tobacco abuse   Acute alcoholic pancreatitis   Acute pancreatitis   CAP (community acquired pneumonia)   Time spent 25 minutes   By signing my name below, I, Edman Circle attest that this documentation has been prepared under the direction and in the presence of Brendia Sacks, M.D.   Electronically signed: Edman Circle  11/09/2014   I personally performed the services described in this documentation. All medical record entries made by the scribe were at my direction. I have reviewed the chart and agree that the record reflects my personal performance and is accurate and complete. Brendia Sacks, MD

## 2014-11-10 DIAGNOSIS — R1013 Epigastric pain: Secondary | ICD-10-CM

## 2014-11-10 MED ORDER — HYDROCODONE-ACETAMINOPHEN 5-325 MG PO TABS: 1.0000 | ORAL_TABLET | Freq: Four times a day (QID) | ORAL | Status: DC | PRN

## 2014-11-10 MED ORDER — AZITHROMYCIN 250 MG PO TABS: 250.0000 mg | ORAL_TABLET | Freq: Every day | ORAL | Status: DC

## 2014-11-10 MED ORDER — CEFUROXIME AXETIL 250 MG PO TABS: 500.0000 mg | ORAL_TABLET | Freq: Two times a day (BID) | ORAL | Status: DC

## 2014-11-10 NOTE — Progress Notes (Signed)
PROGRESS NOTE  Brent [name] QMV:[id number] DOB: 08/08/1970 DOA: 11/06/2014 PCP: Milana Obey, MD  Summary: 44 yo male with PMH of chronic pancreatitis, GERD, alcohol abuse and hypertension who presented to the ED with abdominal pain, nausea and vomiting. Patient stated that he drank 18 beers a day prior to coming into the hospital. While in the ED CXR revealed possible pneumonia at the medial right base. Labs revealed elevated lipase and moderate leukocytosis. Abdominal CT consistent with acute pancreatitis. Patient was admitted for further evaluation.  Assessment/Plan: 1. Acute alcoholic pancreatitis. Essentially resolved. Appetite improved. Able to tolerate liquid diet and lipase trending down as of 9/28. 2. Epigastric pain, secondary to pancreatitis. Essentially resolved 3. Community-acquired pneumonia was considered on admission but patient has been afebrile and leukocytosis has resolved. Lactic acid WNL. F/u CT showed no evidence of pneumonia. Has no symptoms to suggest pneumonia. Abx stopped. 4. EtOH abuse with no signs of withdrawal at this time. Counseled on alcohol cessation. Placed on CIWA protocol.  5. Tobacco abuse. Counseled on importance of cessation. 6. Hypertension. Stable 7. Borderline type 2 DM, stable.  8. Anxiety    Overall improved  Will advance to solid diet.  Anticipated discharge today  Code Status: Full DVT prophylaxis: Lovenox Family Communication: Discussed with patient who understands and has no concerns at this time. Disposition Plan: Anticipate discharge today  Brendia Sacks, MD  [facility]  Pager [phone] If 7PM-7AM, please contact night-coverage at [email or web address], password [facility] 11/10/2014, 6:22 AM  LOS: 3 days   Consultants:    Procedures:    Antibiotics:  Azithromycin 9/26>> 9/28  Ceftriaxone 9/26>> 9/28  HPI/Subjective: Feeling better. Slept through the night. Tolerated liquids. Reports improvement of abdominal  pain. Denies cough or SOB  Objective: Filed Vitals:   11/08/14 2258 11/09/14 0557 11/09/14 1400 11/09/14 2125  BP: 111/76 104/70 100/62 105/74  Pulse: 79 72 76 69  Temp: 98.7 F (37.1 C) 98.4 F (36.9 C) 98.1 F (36.7 C) 98.9 F (37.2 C)  TempSrc: Oral Oral  Oral  Resp: 20 17 18 18   Height:      Weight:      SpO2: 96% 93% 98% 98%    Intake/Output Summary (Last 24 hours) at 11/10/14 0622 Last data filed at 11/09/14 2125  Gross per 24 hour  Intake   2570 ml  Output      0 ml  Net   2570 ml     Filed Weights   11/06/14 2200 11/07/14 0610  Weight: 54.432 kg (120 lb) 54.4 kg (119 lb 14.9 oz)    Exam:  VSS, not hypoxic, afebrile General:  Appears calm and comfortable Cardiovascular: RRR, no m/r/g. No LE edema. Respiratory: CTA bilaterally, no w/r/r. Normal respiratory effort. Abdomen: soft, minimal epigastric tenderness Musculoskeletal: grossly normal tone BUE/BLE   New data reviewed:    Pertinent data since admission:  CXR: 1. Possible pneumonia at the medial right base. Followup PA and lateral chest X-ray is recommended in 3-4 weeks to ensure resolution. 2. Emphysema and lung scarring.  CT Abdomen/Pelvis: 1. Acute pancreatitis with moderate peripancreatic inflammatory change and free fluid tracking along the distal pancreas and left pericolic gutter. No pseudocyst formation. 2. Atelectasis at the lung bases, right greater than left. This likely accounts for the appearance on radiograph, no consolidation to suggest pneumonia.  Lipase initially 647    Pending data:    Scheduled Meds: . ALPRAZolam  1 mg Oral QID  . enoxaparin (LOVENOX) injection  40 mg Subcutaneous Q24H  .  ketorolac  1 drop Both Eyes QID  . pantoprazole  40 mg Oral Daily   Continuous Infusions: . sodium chloride 150 mL/hr at 11/09/14 1757    Principal Problem:   Acute alcoholic pancreatitis Active Problems:   Epigastric pain   ETOH abuse   Tobacco abuse     By signing my name  below, I, Burnett Harry attest that this documentation has been prepared under the direction and in the presence of Brendia Sacks, MD Electronically signed: Burnett Harry, Scribe.  11/10/2014 9:30 AM  I personally performed the services described in this documentation. All medical record entries made by the scribe were at my direction. I have reviewed the chart and agree that the record reflects my personal performance and is accurate and complete. Brendia Sacks, MD

## 2014-11-10 NOTE — Discharge Summary (Signed)
Physician Discharge Summary  Brent [name]:[id number] DOB: 05/12/1970 DOA: 11/06/2014  PCP: Milana Obey, MD  Admit date: 11/06/2014 Discharge date: 11/10/2014  Recommendations for Outpatient Follow-up:  1. Follow up with PCP in 1-2 weeks for resolution of pancreatitis.  2. Continue to encourage alcohol and smoking cessation  Follow-up Information    Follow up with Milana Obey, MD In 2 weeks.   Specialty:  Family Medicine   Why:  Call office for appointment   Contact information:   [address] [place] [address] [phone]        Discharge Diagnoses:  1. Acute alcoholic pancreatitis.  2. Epigastric pain. 3. EtOH abuse. 4. Tobacco abuse. 5. Hypertension. 6. Borderline type 2 DM 7. Anxiety  Discharge Condition: Improved Disposition: Home  Diet recommendation: Normal  Filed Weights   11/06/14 2200 11/07/14 0610  Weight: 54.432 kg (120 lb) 54.4 kg (119 lb 14.9 oz)    History of present illness:  44 yo male with PMH of chronic pancreatitis, GERD, alcohol abuse and hypertension who presented to the ED with abdominal pain, nausea and vomiting. Patient stated that he drank 18 beers a day prior to coming into the hospital. While in the ED CXR revealed possible pneumonia at the medial right base. Labs revealed elevated lipase and moderate leukocytosis. Abdominal CT consistent with acute pancreatitis. Patient was admitted for further evaluation.  Hospital Course:  Acute alcoholic pancreatitis seen on abdominal CT and associated epigastric pain was resolved with IVF, analgesics and antiemetics as needed. Counseled the patient extensively on the importance of alcohol and tobacco cessation. Throughout the course of his admission, he did not show any signs of withdrawal. All other issues remained stable.   Individual issues as below:  1. Acute alcoholic pancreatitis. Essentially resolved. Appetite improved. Able to tolerate liquid diet and lipase  trending down as of 9/28. 2. Epigastric pain, secondary to pancreatitis. Essentially resolved 3. Community-acquired pneumonia was considered on admission but patient has been afebrile and leukocytosis has resolved. Lactic acid WNL. F/u CT showed no evidence of pneumonia. Has no symptoms to suggest pneumonia. Abx stopped. 4. EtOH abuse with no signs of withdrawal at this time. Counseled on alcohol cessation. Placed on CIWA protocol.  5. Tobacco abuse. Counseled on importance of cessation. 6. Hypertension. Stable 7. Borderline type 2 DM, stable.  8. Anxiety  Consultants:  None  Procedures:  None  Antibiotics:  Azithromycin 9/26>> 9/28  Ceftriaxone 9/26>> 9/28   Discharge Instructions Discharge Instructions    Activity as tolerated - No restrictions    Complete by:  As directed      Discharge instructions    Complete by:  As directed   Call your physician or seek immediate medical attention for recurrent pain, fever, vomiting, inability to eat or drink fluids or worsening of condition. Please stop drinking alcohol as this can cause pancreatitis again. Please stop smoking. Continue low fat diet for next 3 days then advance to regular diet. If you have pain, go back to low fat diet.             Current Discharge Medication List    CONTINUE these medications which have CHANGED   Details  HYDROcodone-acetaminophen (NORCO/VICODIN) 5-325 MG tablet Take 1 tablet by mouth every 6 (six) hours as needed for moderate pain.      CONTINUE these medications which have NOT CHANGED   Details  ALPRAZolam (XANAX) 1 MG tablet Take 1 mg by mouth 4 (four) times daily.  ketorolac (ACULAR) 0.4 % SOLN Place 1 drop into both eyes 4 (four) times daily.    omeprazole (PRILOSEC) 40 MG capsule Take 40 mg by mouth at bedtime.     zolpidem (AMBIEN) 10 MG tablet Take 10 mg by mouth at bedtime.    polyethylene glycol (MIRALAX / GLYCOLAX) packet Take 17 g by mouth daily. Qty: 14 each, Refills:  0      STOP taking these medications     ciprofloxacin (CIPRO) 250 MG tablet      HYDROcodone-acetaminophen (NORCO) 10-325 MG per tablet      oxyCODONE-acetaminophen (PERCOCET) 10-325 MG per tablet      promethazine (PHENERGAN) 25 MG tablet        No Rx for pain medication given, above modified to reflect how patient takes medication.  No Known Allergies  The results of significant diagnostics from this hospitalization (including imaging, microbiology, ancillary and laboratory) are listed below for reference.    Significant Diagnostic Studies: Ct Abdomen Pelvis W Contrast  11/07/2014   CLINICAL DATA:  Abdominal pain, nausea and vomiting. Pancreatitis. Possible lower lung pneumonia on radiograph.  EXAM: CT ABDOMEN AND PELVIS WITH CONTRAST  TECHNIQUE: Multidetector CT imaging of the abdomen and pelvis was performed using the standard protocol following bolus administration of intravenous contrast.  CONTRAST:  50mL OMNIPAQUE IOHEXOL 300 MG/ML SOLN, OMNIPAQUE IOHEXOL 300 MG/ML SOLN  COMPARISON:  Chest radiograph 1 day prior.  CT 04/2014  FINDINGS: Lower chest: There is dependent atelectasis in both lower lobes. This is more pronounced in the medial right lower lobe, likely accounting the appearance of radiograph. No confluent airspace disease. No pleural effusion.  Liver: No focal lesion.  Hepatobiliary: Gallbladder physiologically distended, no calcified stone. No biliary dilatation.  Pancreas: Diffuse peripancreatic fat stranding from the uncinate process through the tail. Associated peripancreatic fluid tracks in the lesser sac and left pericolic gutter. There is homogeneous pancreatic enhancement without findings of necrosis, pseudocyst or abscess. The splenic vein remains patent.  Spleen: Normal.  Adrenal glands: No nodule.  Kidneys: Symmetric renal enhancement. No hydronephrosis. Tiny subcentimeter cysts in the interpolar right and left kidney. Previous bilateral renal stones are not  definitively seen.  Stomach/Bowel: Stomach physiologically distended. Mild walled wall thickening, likely related to pancreatic inflammation. There are no dilated or thickened small bowel loops. Small volume of stool throughout the colon without colonic wall thickening. The appendix is not confidently identified.  Vascular/Lymphatic: No retroperitoneal adenopathy. Abdominal aorta is normal in caliber. There is mild atherosclerosis, advanced for age.  Reproductive: Prostate gland is prominent for age.  Bladder: Distended without wall thickening.  Other: No pneumoperitoneum.  No pelvic free fluid.  Musculoskeletal: There are no acute or suspicious osseous abnormalities. Chronic deformity of L2 and stable degenerative chain show at L4-L5.  IMPRESSION: 1. Acute pancreatitis with moderate peripancreatic inflammatory change and free fluid tracking along the distal pancreas and left pericolic gutter. No pseudocyst formation. 2. Atelectasis at the lung bases, right greater than left. This likely accounts for the appearance on radiograph, no consolidation to suggest pneumonia.   Electronically Signed   By: Rubye Oaks M.D.   On: 11/07/2014 01:46   Dg Chest Portable 1 View  11/06/2014   CLINICAL DATA:  Epigastric pain.  History pancreatitis  EXAM: PORTABLE CHEST 1 VIEW  COMPARISON:  01/16/2014  FINDINGS: Severe emphysematous change. Hazy density at the medial right base, seen previously but more prominent. Linear scar at the left apex. Normal heart size and stable mediastinal contours.  IMPRESSION: 1. Possible pneumonia at the medial right base. Followup PA and lateral chest X-ray is recommended in 3-4 weeks to ensure resolution. 2. Emphysema and lung scarring.   Electronically Signed   By: Marnee Spring M.D.   On: 11/06/2014 23:38     Labs: Basic Metabolic Panel:  Recent Labs Lab 11/06/14 2250 11/07/14 0615 11/08/14 0457 11/09/14 0530  NA 141 135 135 138  K 3.4* 3.5 3.4* 3.7  CL 108 104 105 110  CO2  GLUCOSE 102* 125* 74 88  BUN CREATININE 0.65 0.53* 0.60* 0.52*  CALCIUM 7.5* 8.1* 8.0* 8.2*   Liver Function Tests:  Recent Labs Lab 11/06/14 2250 11/07/14 0615 11/08/14 0457 11/09/14 0530  AST 16 16 13* 14*  ALT 17 15* 14* 12*  ALKPHOS 87 93 77 69  BILITOT 0.8 0.8 1.4* 0.6  PROT 5.7* 6.1* 6.1* 5.5*  ALBUMIN 3.3* 3.4* 3.3* 2.8*    Recent Labs Lab 11/06/14 2250 11/07/14 0615 11/08/14 0457 11/09/14 0530  LIPASE 585* 647* 125* 114*  CBC:  Recent Labs Lab 11/06/14 2250 11/07/14 0615 11/08/14 0457 11/09/14 0530  WBC 22.9* 15.9* 11.9* 6.1  HGB 13.5 14.0 13.2 11.4*  HCT 41.5 43.0 40.5 35.0*  MCV 89.4 88.8 88.4 88.2  PLT 388 379 369 316      Principal Problem:   Acute alcoholic pancreatitis Active Problems:   Epigastric pain   ETOH abuse   Tobacco abuse   Time coordinating discharge: 35 minutes  Signed:  Brendia Sacks, MD [facility] 11/10/2014, 6:27 AM   By signing my name below, I, Burnett Harry attest that this documentation has been prepared under the direction and in the presence of Brendia Sacks, MD Electronically signed: Burnett Harry, Scribe.  11/10/2014 9:30 AM  I personally performed the services described in this documentation. All medical record entries made by the scribe were at my direction. I have reviewed the chart and agree that the record reflects my personal performance and is accurate and complete. Brendia Sacks, MD

## 2014-11-10 NOTE — Progress Notes (Signed)
Patient has been discharged and IV access removed. Waiting on ride and he will be taken down by wheelchair.

## 2014-11-10 NOTE — Care Management Note (Signed)
Case Management Note  Patient Details  Name: Brent [name] MRN: [id number] Date of Birth: 1971-01-30  Subjective/Objective:                    Action/Plan:   Expected Discharge Date:  11/09/14               Expected Discharge Plan:  Home/Self Care  In-House Referral:  NA  Discharge planning Services  CM Consult  Post Acute Care Choice:  NA Choice offered to:  NA  DME Arranged:    DME Agency:     HH Arranged:    HH Agency:     Status of Service:  Completed, signed off  Medicare Important Message Given:  Yes-second notification given Date Medicare IM Given:    Medicare IM give by:    Date Additional Medicare IM Given:    Additional Medicare Important Message give by:     If discussed at Long Length of Stay Meetings, dates discussed:    Additional Comments: Pt discharged home today. No CM needs noted. [name], RN 11/10/2014, 10:32 AM

## 2014-11-10 NOTE — Care Management Important Message (Signed)
Important Message  Patient Details  Name: Brent [name] MRN: [id number] Date of Birth: [birth year]   Medicare Important Message Given:  Yes-second notification given    Cheryl Flash, RN 11/10/2014, 10:32 AM

## 2015-02-08 ENCOUNTER — Encounter (HOSPITAL_COMMUNITY): Payer: Self-pay | Admitting: Emergency Medicine

## 2015-02-08 ENCOUNTER — Emergency Department (HOSPITAL_COMMUNITY)
Admission: EM | Admit: 2015-02-08 | Discharge: 2015-02-09 | Disposition: A | Discharge status: Home / Self Care | Payer: Medicare Other | Attending: Emergency Medicine | Admitting: Emergency Medicine

## 2015-02-08 DIAGNOSIS — J439 Emphysema, unspecified: Secondary | ICD-10-CM | POA: Diagnosis not present

## 2015-02-08 DIAGNOSIS — G8929 Other chronic pain: Secondary | ICD-10-CM | POA: Insufficient documentation

## 2015-02-08 DIAGNOSIS — M1990 Unspecified osteoarthritis, unspecified site: Secondary | ICD-10-CM | POA: Diagnosis not present

## 2015-02-08 DIAGNOSIS — K219 Gastro-esophageal reflux disease without esophagitis: Secondary | ICD-10-CM | POA: Diagnosis not present

## 2015-02-08 DIAGNOSIS — Z87448 Personal history of other diseases of urinary system: Secondary | ICD-10-CM | POA: Diagnosis not present

## 2015-02-08 DIAGNOSIS — R1010 Upper abdominal pain, unspecified: Secondary | ICD-10-CM | POA: Diagnosis not present

## 2015-02-08 DIAGNOSIS — F329 Major depressive disorder, single episode, unspecified: Secondary | ICD-10-CM | POA: Diagnosis not present

## 2015-02-08 DIAGNOSIS — Z79899 Other long term (current) drug therapy: Secondary | ICD-10-CM | POA: Insufficient documentation

## 2015-02-08 DIAGNOSIS — F17210 Nicotine dependence, cigarettes, uncomplicated: Secondary | ICD-10-CM | POA: Insufficient documentation

## 2015-02-08 DIAGNOSIS — Z791 Long term (current) use of non-steroidal anti-inflammatories (NSAID): Secondary | ICD-10-CM | POA: Diagnosis not present

## 2015-02-08 DIAGNOSIS — F410 Panic disorder [episodic paroxysmal anxiety] without agoraphobia: Secondary | ICD-10-CM | POA: Insufficient documentation

## 2015-02-08 DIAGNOSIS — Z87438 Personal history of other diseases of male genital organs: Secondary | ICD-10-CM | POA: Insufficient documentation

## 2015-02-08 DIAGNOSIS — Z8619 Personal history of other infectious and parasitic diseases: Secondary | ICD-10-CM | POA: Diagnosis not present

## 2015-02-08 DIAGNOSIS — R112 Nausea with vomiting, unspecified: Secondary | ICD-10-CM | POA: Diagnosis present

## 2015-02-08 LAB — CBC WITH DIFFERENTIAL/PLATELET
Basophils Absolute: 0.1 10*3/uL (ref 0.0–0.1)
Basophils Relative: 1 %
Eosinophils Absolute: 0.2 10*3/uL (ref 0.0–0.7)
Eosinophils Relative: 2 %
HEMATOCRIT: 43 % (ref 39.0–52.0)
HEMOGLOBIN: 14.3 g/dL (ref 13.0–17.0)
LYMPHS ABS: 2.6 10*3/uL (ref 0.7–4.0)
Lymphocytes Relative: 25 %
MCH: 27.4 pg (ref 26.0–34.0)
MCHC: 33.3 g/dL (ref 30.0–36.0)
MCV: 82.5 fL (ref 78.0–100.0)
MONOS PCT: 10 %
Monocytes Absolute: 1 10*3/uL (ref 0.1–1.0)
NEUTROS ABS: 6.3 10*3/uL (ref 1.7–7.7)
NEUTROS PCT: 62 %
Platelets: 429 10*3/uL — ABNORMAL HIGH (ref 150–400)
RBC: 5.21 MIL/uL (ref 4.22–5.81)
RDW: 15.1 % (ref 11.5–15.5)
WBC: 10.1 10*3/uL (ref 4.0–10.5)

## 2015-02-08 MED ORDER — MORPHINE SULFATE (PF) 4 MG/ML IV SOLN
4.0000 mg | Freq: Once | INTRAVENOUS | Status: AC
  Administered 2015-02-08: 4 mg via INTRAVENOUS
  Filled 2015-02-08: qty 1

## 2015-02-08 MED ORDER — ONDANSETRON HCL 4 MG/2ML IJ SOLN
4.0000 mg | Freq: Once | INTRAMUSCULAR | Status: AC
  Administered 2015-02-08: 4 mg via INTRAVENOUS
  Filled 2015-02-08: qty 2

## 2015-02-08 MED ORDER — SODIUM CHLORIDE 0.9 % IV BOLUS (SEPSIS)
1000.0000 mL | Freq: Once | INTRAVENOUS | Status: AC
  Administered 2015-02-08: 1000 mL via INTRAVENOUS

## 2015-02-08 NOTE — ED Provider Notes (Signed)
CSN: [id number]     Arrival date & time 02/08/15  2252 History  By signing my name below, I, Brent [name], attest that this documentation has been prepared under the direction and in the presence of Brent Crease, MD. Electronically Signed: [name], ED Scribe. 02/08/2015. 11:22 PM  Chief Complaint  Patient presents with  . Emesis  . Abdominal Pain    The history is provided by the patient. No language interpreter was used.   Brent [name] is a 44 y.o. male with history of pancreatitis and GERD who presents to the Emergency Department complaining of worsened nausea and vomiting with onset 2 weeks ago. Pt states that he was treated in the hospital for pancreatitis in September and has had nausea and vomiting since then. Today he has been unable to hold down anything including water.  Associated symptoms include burning upper abdominal pain. Pt denies diarrhea. He states that he has not used alcohol since being discharged from the hospital 3 months ago.   Past Medical History  Diagnosis Date  . Retinal vasculitis   . Condylomata acuminata in male     multiple procedures --  penile, peri-rectal , perineum  . GERD (gastroesophageal reflux disease)   . Chronic back pain   . Multiple sclerosis (HCC)     pt. states has 4 small brain lesions  . Arthritis   . Depression   . Hypertension     was on medication for short time, htn was caused by prednisone per pt  . Anxiety     Disabled due to panic attacks  . Headache(784.0)   . Fibromyalgia   . Emphysematous COPD (HCC)   . Dyspnea on exertion   . Borderline type 2 diabetes mellitus   . History of suicidal ideation     2006   adx  . History of panic attacks   . History of esophageal dilatation   . History of chronic pancreatitis     severeal adx for this /  2008  dx alcoholic pancreatitis  . Bilateral ureteral calculi   . Nephrolithiasis     bilateral   . History of kidney stones   . History of sepsis     adx  05-01-2014--  urosepsis due to kidney stones obstruction/ hydronephrosis  . BPH (benign prostatic hypertrophy)   . DDD (degenerative disc disease), cervical   . History of hiatal hernia    Past Surgical History  Procedure Laterality Date  . Wisdom tooth extraction    . Multiple extractions with alveoloplasty N/A 04/22/2014    Procedure: MULTIPLE EXTRACTIONS ( 2,3,5,6,7,8,9,10,11,13,14,15,21,28  WITH ALVEOLOPLASTY;  Surgeon: Ocie Doyne, DDS;  Location: [facility] OR;  Service: Oral Surgery;  Laterality: N/A;  . Colonoscopy  10/08/2011    Jenkins:Normal colon/Anal condyloma without extension proximal to dentate line  . Flexible bronchoscopy N/A 08/12/2012    Procedure: FLEXIBLE BRONCHOSCOPY;  Surgeon: Fredirick Maudlin, MD;  Location: [facility];  Service: Pulmonary;  Laterality: N/A;  . Esophagogastroduodenoscopy (egd) with propofol N/A 11/12/2012    EEF:EOFHQR esophagus s/p  passage of a Maloney dilator and biopsy. Abnormal gastric mucosa-status post biopsy  . Maloney dilation N/A 11/12/2012    Procedure: MALONEY DILATION (93mm);  Surgeon: Corbin Ade, MD;  Location: [facility];  Service: Endoscopy;  Laterality: N/A;  . Esophageal biopsy  11/12/2012    Procedure: GASTRIC AND ESOPHAGEAL BIOPSIES;  Surgeon: Corbin Ade, MD;  Location: [facility];  Service: Endoscopy;;  . Cystoscopy w/ ureteral stent placement  Bilateral 05/01/2014    Procedure: CYSTOSCOPY WITH BILATERAL RETROGRADE PYELOGRAM; BILATERAL URETERAL STENT PLACEMENT;  Surgeon: Barron Alvine, MD;  Location: [facility];  Service: Urology;  Laterality: Bilateral;  . Electrocautery/ desiccation of condyloma lesions  01-15-2008  &  12-29-2009    PENIS, PERI-RECTAL , PERINUEM  . Cataract extraction w/ intraocular lens  implant, bilateral    . Cystoscopy with ureteroscopy and stent placement Bilateral 06/06/2014    Procedure: CYSTOSCOPY WITH J2 STENT EXTRACTION,,URETEROSCOPY WITH EXTRACTION OF STONES,;  Surgeon: Marcine Matar, MD;  Location: [facility];  Service: Urology;  Laterality: Bilateral;  . Cystoscopy with ureteroscopy  06/06/2014    Procedure: CYSTOSCOPY WITH URETEROSCOPY;  Surgeon: Marcine Matar, MD;  Location: [facility];  Service: Urology;;  . Cystoscopy w/ ureteral stent removal Bilateral 06/06/2014    Procedure: CYSTOSCOPY WITH STENT REMOVAL;  Surgeon: Marcine Matar, MD;  Location[facility];  Service: Urology;  Laterality: Bilateral;   Family History  Problem Relation Age of Onset  . Hypertension Mother   . Breast cancer Mother   . Melanoma Mother   . Stroke Mother   . Lung cancer Father   . Lung cancer Father 59  . Colon cancer Neg Hx   . Colitis Neg Hx   . Cirrhosis Neg Hx   . Liver disease Neg Hx   . Pancreatic cancer Neg Hx   . Pancreatitis Neg Hx   . Anesthesia problems Neg Hx   . Hypotension Neg Hx   . Malignant hyperthermia Neg Hx   . Pseudochol deficiency Neg Hx    Social History  Substance Use Topics  . Smoking status: Current Every Day Smoker -- 3.00 packs/day for 20 years    Types: Cigarettes  . Smokeless tobacco: None  . Alcohol Use: No     Comment: Stopped since Sept 2016    Review of Systems  Gastrointestinal: Positive for nausea, vomiting and abdominal pain.  All other systems reviewed and are negative.   Allergies  Review of patient's allergies indicates no known allergies.  Home Medications   Prior to Admission medications   Medication Sig Start Date End Date Taking? Authorizing Provider  ALPRAZolam Prudy Feeler) 1 MG tablet Take 1 mg by mouth 4 (four) times daily.    Historical Provider, MD  HYDROcodone-acetaminophen (NORCO/VICODIN) 5-325 MG tablet Take 1 tablet by mouth every 6 (six) hours as needed for moderate pain. 11/10/14   Standley Brooking, MD  ketorolac (ACULAR) 0.4 % SOLN Place 1 drop into both eyes 4 (four) times daily. 06/24/14   Historical Provider, MD  omeprazole (PRILOSEC) 40 MG capsule Take 40 mg by mouth at bedtime.      Historical Provider, MD  polyethylene glycol (MIRALAX / GLYCOLAX) packet Take 17 g by mouth daily. Patient taking differently: Take 17 g by mouth daily as needed for mild constipation.  05/03/14   Erick Blinks, MD  zolpidem (AMBIEN) 10 MG tablet Take 10 mg by mouth at bedtime.    Historical Provider, MD   BP 93/72 mmHg  Pulse 96  Temp(Src) 97.7 F (36.5 C) (Oral)  Resp 18  Ht  (1.702 m)  Wt 120 lb (54.432 kg)  BMI 18.79 kg/m2  SpO2 97% Physical Exam  Constitutional: He is oriented to person, place, and time. He appears well-developed and well-nourished. No distress.  HENT:  Head: Normocephalic and atraumatic.  Right Ear: Hearing normal.  Left Ear: Hearing normal.  Nose: Nose normal.  Mouth/Throat: Oropharynx is  clear and moist and mucous membranes are normal.  Eyes: Conjunctivae and EOM are normal. Pupils are equal, round, and reactive to light.  Neck: Normal range of motion. Neck supple.  Cardiovascular: Regular rhythm, S1 normal and S2 normal.  Exam reveals no gallop and no friction rub.   No murmur heard. Pulmonary/Chest: Effort normal and breath sounds normal. No respiratory distress. He exhibits no tenderness.  Abdominal: Soft. Normal appearance and bowel sounds are normal. There is no hepatosplenomegaly. There is tenderness. There is no rebound, no guarding, no tenderness at McBurney's point and negative Murphy's sign. No hernia.  Diffuse upper abdominal pain  Musculoskeletal: Normal range of motion.  Neurological: He is alert and oriented to person, place, and time. He has normal strength. No cranial nerve deficit or sensory deficit. Coordination normal. GCS eye subscore is 4. GCS verbal subscore is 5. GCS motor subscore is 6.  Skin: Skin is warm, dry and intact. No rash noted. No cyanosis.  Psychiatric: He has a normal mood and affect. His speech is normal and behavior is normal. Thought content normal.  Nursing note and vitals reviewed.   ED Course  Procedures  (including critical care time) DIAGNOSTIC STUDIES: Oxygen Saturation is 97% on RA,  normal by my interpretation.    COORDINATION OF CARE: 11:19 PM Discussed treatment plan which includes lab work, IVF,  and Zofran with pt at bedside and pt agreed to plan.  Labs Review Labs Reviewed  LIPASE, BLOOD  CBC WITH DIFFERENTIAL/PLATELET  COMPREHENSIVE METABOLIC PANEL    Imaging Review No results found. I have personally reviewed and evaluated these lab results as part of my medical decision-making.   EKG Interpretation None      MDM   Final diagnoses:  None  abdominal pain vomiting  Patient presents to the ER for evaluation of nausea, vomiting and abdominal pain. Patient reports having an episode of pancreatitis in September and has not felt well since. Patient reports that he has had persistent pain since his hospitalization but over the last several days his pain is worse in any has had nausea and vomiting. Patient did have diffuse upper abdominal tenderness without guarding or rebound. Lipase is normal. LFTs are normal. White blood cell count is normal and he is afebrile. Patient administered IV fluids, analgesia, antiemetics here in the ER. As his workup is unremarkable, he does not require hospitalization. Will treat with analgesia and antiemetics at home, follow-up with PCP. Return if symptoms worsen.  I personally performed the services described in this documentation, which was scribed in my presence. The recorded information has been reviewed and is accurate.    Brent Crease, MD 02/09/15 [phone]

## 2015-02-08 NOTE — ED Notes (Signed)
Pt states that he was dx with pancreatitis about 2 months ago - Pt states that he hasn't been drinking and for past 7 days has been having increase in vomiting and pain. Today unable to keep even water down

## 2015-02-09 LAB — COMPREHENSIVE METABOLIC PANEL
ALBUMIN: 4 g/dL (ref 3.5–5.0)
ALK PHOS: 65 U/L (ref 38–126)
ALT: 17 U/L (ref 17–63)
AST: 16 U/L (ref 15–41)
Anion gap: 10 (ref 5–15)
BUN: 16 mg/dL (ref 6–20)
CALCIUM: 8.8 mg/dL — AB (ref 8.9–10.3)
CHLORIDE: 103 mmol/L (ref 101–111)
CO2: 23 mmol/L (ref 22–32)
CREATININE: 0.73 mg/dL (ref 0.61–1.24)
GFR calc non Af Amer: >60 mL/min (ref >60)
GLUCOSE: 83 mg/dL (ref 65–99)
Potassium: 3.3 mmol/L — ABNORMAL LOW (ref 3.5–5.1)
SODIUM: 136 mmol/L (ref 135–145)
Total Bilirubin: 0.3 mg/dL (ref 0.3–1.2)
Total Protein: 6.8 g/dL (ref 6.5–8.1)

## 2015-02-09 LAB — LIPASE, BLOOD: LIPASE: 48 U/L (ref 11–51)

## 2015-02-09 MED ORDER — HYDROCODONE-ACETAMINOPHEN 5-325 MG PO TABS: 2.0000 | ORAL_TABLET | ORAL | Status: DC | PRN

## 2015-02-09 MED ORDER — PROMETHAZINE HCL 25 MG PO TABS
25.0000 mg | ORAL_TABLET | Freq: Four times a day (QID) | ORAL | Status: DC | PRN
Start: 2015-02-09 — End: 2016-02-18

## 2015-02-09 NOTE — Discharge Instructions (Signed)

## 2015-02-09 NOTE — ED Notes (Signed)
Patient verbalizes understanding of discharge instructions, prescription medications, home care and follow up care. Patient ambulatory out of department at this time with family. 

## 2015-04-05 ENCOUNTER — Emergency Department (HOSPITAL_COMMUNITY)
Admission: EM | Admit: 2015-04-05 | Discharge: 2015-04-05 | Disposition: A | Discharge status: Home / Self Care | Payer: Medicare Other | Attending: Emergency Medicine | Admitting: Emergency Medicine

## 2015-04-05 ENCOUNTER — Emergency Department (HOSPITAL_COMMUNITY): Payer: Medicare Other

## 2015-04-05 ENCOUNTER — Encounter (HOSPITAL_COMMUNITY): Payer: Self-pay | Admitting: Emergency Medicine

## 2015-04-05 DIAGNOSIS — R7303 Prediabetes: Secondary | ICD-10-CM | POA: Insufficient documentation

## 2015-04-05 DIAGNOSIS — R197 Diarrhea, unspecified: Secondary | ICD-10-CM | POA: Diagnosis not present

## 2015-04-05 DIAGNOSIS — I10 Essential (primary) hypertension: Secondary | ICD-10-CM | POA: Insufficient documentation

## 2015-04-05 DIAGNOSIS — R112 Nausea with vomiting, unspecified: Secondary | ICD-10-CM | POA: Insufficient documentation

## 2015-04-05 DIAGNOSIS — R05 Cough: Secondary | ICD-10-CM | POA: Insufficient documentation

## 2015-04-05 DIAGNOSIS — B349 Viral infection, unspecified: Secondary | ICD-10-CM | POA: Diagnosis present

## 2015-04-05 LAB — CBC WITH DIFFERENTIAL/PLATELET
BASOS PCT: 0 %
Basophils Absolute: 0 10*3/uL (ref 0.0–0.1)
Eosinophils Absolute: 0.1 10*3/uL (ref 0.0–0.7)
Eosinophils Relative: 1 %
HEMATOCRIT: 40.9 % (ref 39.0–52.0)
HEMOGLOBIN: 13.5 g/dL (ref 13.0–17.0)
LYMPHS ABS: 2.3 10*3/uL (ref 0.7–4.0)
Lymphocytes Relative: 22 %
MCH: 27.7 pg (ref 26.0–34.0)
MCHC: 33 g/dL (ref 30.0–36.0)
MCV: 84 fL (ref 78.0–100.0)
MONOS PCT: 8 %
Monocytes Absolute: 0.8 10*3/uL (ref 0.1–1.0)
NEUTROS ABS: 7.3 10*3/uL (ref 1.7–7.7)
NEUTROS PCT: 69 %
Platelets: 360 10*3/uL (ref 150–400)
RBC: 4.87 MIL/uL (ref 4.22–5.81)
RDW: 18.3 % — ABNORMAL HIGH (ref 11.5–15.5)
WBC: 10.5 10*3/uL (ref 4.0–10.5)

## 2015-04-05 LAB — BASIC METABOLIC PANEL
Anion gap: 9 (ref 5–15)
BUN: 10 mg/dL (ref 6–20)
CHLORIDE: 103 mmol/L (ref 101–111)
CO2: 24 mmol/L (ref 22–32)
CREATININE: 0.63 mg/dL (ref 0.61–1.24)
Calcium: 8.8 mg/dL — ABNORMAL LOW (ref 8.9–10.3)
GFR calc non Af Amer: >60 mL/min (ref >60)
Glucose, Bld: 114 mg/dL — ABNORMAL HIGH (ref 65–99)
POTASSIUM: 4.3 mmol/L (ref 3.5–5.1)
Sodium: 136 mmol/L (ref 135–145)

## 2015-04-05 LAB — URINALYSIS, ROUTINE W REFLEX MICROSCOPIC
Bilirubin Urine: NEGATIVE
GLUCOSE, UA: NEGATIVE mg/dL
HGB URINE DIPSTICK: NEGATIVE
KETONES UR: NEGATIVE mg/dL
LEUKOCYTES UA: NEGATIVE
Nitrite: NEGATIVE
PROTEIN: NEGATIVE mg/dL
Specific Gravity, Urine: 1.015 (ref 1.005–1.030)
pH: 5 (ref 5.0–8.0)

## 2015-04-05 LAB — HEPATIC FUNCTION PANEL
ALBUMIN: 3.5 g/dL (ref 3.5–5.0)
ALK PHOS: 75 U/L (ref 38–126)
ALT: 17 U/L (ref 17–63)
AST: 22 U/L (ref 15–41)
BILIRUBIN TOTAL: 0.3 mg/dL (ref 0.3–1.2)
Bilirubin, Direct: <0.1 mg/dL — ABNORMAL LOW (ref 0.1–0.5)
Total Protein: 6.8 g/dL (ref 6.5–8.1)

## 2015-04-05 LAB — TROPONIN I

## 2015-04-05 LAB — LIPASE, BLOOD: LIPASE: 35 U/L (ref 11–51)

## 2015-04-05 MED ORDER — ONDANSETRON 8 MG PO TBDP
8.0000 mg | ORAL_TABLET | Freq: Once | ORAL | Status: AC | PRN
  Administered 2015-04-05: 8 mg via ORAL
  Filled 2015-04-05: qty 1

## 2015-04-05 MED ORDER — ONDANSETRON HCL 4 MG PO TABS: 4.0000 mg | ORAL_TABLET | Freq: Three times a day (TID) | ORAL | Status: DC | PRN

## 2015-04-05 MED ORDER — BENZONATATE 100 MG PO CAPS: 100.0000 mg | ORAL_CAPSULE | Freq: Three times a day (TID) | ORAL | Status: DC | PRN

## 2015-04-05 MED ORDER — IPRATROPIUM-ALBUTEROL 0.5-2.5 (3) MG/3ML IN SOLN
3.0000 mL | Freq: Once | RESPIRATORY_TRACT | Status: AC
  Administered 2015-04-05: 3 mL via RESPIRATORY_TRACT
  Filled 2015-04-05: qty 3

## 2015-04-05 MED ORDER — ALBUTEROL SULFATE HFA 108 (90 BASE) MCG/ACT IN AERS
2.0000 | INHALATION_SPRAY | RESPIRATORY_TRACT | Status: AC
  Administered 2015-04-05: 2 via RESPIRATORY_TRACT
  Filled 2015-04-05: qty 6.7

## 2015-04-05 MED ORDER — ALBUTEROL SULFATE (2.5 MG/3ML) 0.083% IN NEBU
2.5000 mg | INHALATION_SOLUTION | Freq: Once | RESPIRATORY_TRACT | Status: AC
  Administered 2015-04-05: 2.5 mg via RESPIRATORY_TRACT
  Filled 2015-04-05: qty 3

## 2015-04-05 NOTE — ED Notes (Signed)
Pt tolerating ginger ale 

## 2015-04-05 NOTE — ED Notes (Addendum)
Pt c/o sob/cough/congestion/chest pain/body aches/chills/v/d x 3 days.

## 2015-04-05 NOTE — Discharge Instructions (Signed)
°Emergency Department Resource Guide °1) Find a Doctor and Pay Out of Pocket °Although you won't have to find out who is covered by your insurance plan, it is a good idea to ask around and get recommendations. You will then need to call the office and see if the doctor you have chosen will accept you as a new patient and what types of options they offer for patients who are self-pay. Some doctors offer discounts or will set up payment plans for their patients who do not have insurance, but you will need to ask so you aren't surprised when you get to your appointment. ° °2) Contact Your Local Health Department °Not all health departments have doctors that can see patients for sick visits, but many do, so it is worth a call to see if yours does. If you don't know where your local health department is, you can check in your phone book. The CDC also has a tool to help you locate your state's health department, and many state websites also have listings of all of their local health departments. ° °3) Find a Walk-in Clinic °If your illness is not likely to be very severe or complicated, you may want to try a walk in clinic. These are popping up all over the country in pharmacies, drugstores, and shopping centers. They're usually staffed by nurse practitioners or physician assistants that have been trained to treat common illnesses and complaints. They're usually fairly quick and inexpensive. However, if you have serious medical issues or chronic medical problems, these are probably not your best option. ° °No Primary Care Doctor: °- Call [facility] at  832-8000 - they can help you locate a primary care doctor that  accepts your insurance, provides certain services, etc. °- Physician Referral Service- 1-800-533-3463 ° °Chronic Pain Problems: °Organization         Address  Phone   Notes  °[facility]  (336) 297-2271 Patients need to be referred by their primary care doctor.  ° °Medication  Assistance: °Organization         Address  Phone   Notes  °[place] [facility] [address] °[place], [place] [address] (336) 641-8030 --Must be a resident of [place] °-- Must have NO insurance coverage whatsoever (no Medicaid/ Medicare, etc.) °-- The pt. MUST have a primary care doctor that directs their care regularly and follows them in the community °  °[facility]  (866) 331-1348   °[employer]  (888) 892-1162   ° °Agencies that provide inexpensive medical care: °Organization         Address  Phone   Notes  °[facility]  (336) 832-8035   °[facility]    (336) 832-7272   °[facility] [address] °[place], [place] [address] (336) 832-4777   °[facility] [address], °[place] (336) 271-4999   °[facility]    (336) 373-0678   °[facility]    (336) 272-1050   °[facility] ° [address], [place] Phone:  (336) 832-4444, Fax:  (336) 832-4440 Hours of Operation:  9 am - 6 pm, M-F.  Also accepts Medicaid/Medicare and self-pay.  °[facility] ° [address], [place] Phone: (336) 832-3150, Fax: (336) 832-3151. Hours of Operation:  8:30 am - 5:30 pm, M-F.  Also accepts Medicaid and self-pay.  °[facility] [place] [address], [place] Phone: (336) 878-6027   °[facility] [address], [place], [place] (336)723-1848, Ext. 123 Mondays & Thursdays: 7-9 AM.  First 15 patients are seen on a first come, first serve basis. °  ° °Medicaid-accepting [place] Providers: ° °Organization         Address  Phone   Notes  °[facility] [address], [place] (336) 641-2100 Also accepts self-pay patients.  °[facility] [address], [place] ° (336) 856-9996   °[facility] [address], [place]  (336) 288-8857   °[facility] [address] [place] [address], [place] (336) 299-7000   °Veita Bland [address], [place]  ° (336) 373-1557 Only accepts [place] Access Medicaid patients after they have their name applied to their card.  ° °Self-Pay (no insurance) in [place]: ° °Organization         Address  Phone   Notes  °Sickle Cell Patients, [facility] [address], [place] (336) 832-1970   °[facility] [address], [place] (336) 832-4400   °[facility] ° [address], [place] (336) 992-4800   °[facility]/Dr. Osei-Bonsu ° [address] [place] [address], [place] or [address], [place] (336) 841-8500 Phone number for both [place] and [place] locations is the same.  °[facility] [address], [place] (336) 299-0000   °[facility] [address] [place] [address], [place] or [address] (336) 852-7530 °(336) 878-2260   °[facility] [address], [place] (336) 350-1642, phone; (336) 294-5005, fax Sees patients 1st and 3rd Saturday of every month.  Must not qualify for public or private insurance (i.e. Medicaid, Medicare, [place] Health Choice, Veterans' Benefits) • Household income should be no more than 200% of the poverty level •The clinic cannot treat you if you are pregnant or think you are pregnant • Sexually transmitted diseases are not treated at the clinic.  ° ° °Dental Care: °Organization         Address  Phone  Notes  °[place] [facility] [address], [place] (336) 641-6152 Accepts children up to age 21 who are enrolled in Medicaid or [place] Health Choice; pregnant women with a Medicaid card; and children who have applied for Medicaid or [place] Health Choice, but were declined, whose parents can pay a reduced fee at time of service.  °[place]  [facility] [place]  [address], [place] (336) 641-7733 Accepts children up to age 21 who are enrolled in Medicaid or [place] Health Choice; pregnant women with a Medicaid card; and children who have applied for Medicaid or [place] Health Choice, but were declined, whose parents can pay a reduced fee at time of service.  °[facility] ° [address], [place] (336) 641-4533 Patients are seen by appointment only. Walk-ins are not accepted. [facility] will see patients 18 years of age and older. °Monday - Tuesday (8am-5pm) °Most Wednesdays (8:30-5pm) °$30 per visit, cash only  °[facility] ° [address], [place] (336) 641-4533 Patients are seen by appointment only. Walk-ins are not accepted. [facility] will see patients 18 years of age and older. °One   Wednesday Evening (Monthly: Volunteer Based).  $30 per visit, cash only  °[facility]  (919) 537-3737 for adults; Children under age 4, call Graduate Pediatric Dentistry at ([phone]) [phone]. Children aged 4-14, please call (919) [phone] to request a pediatric application. ° Dental services are provided in all areas of dental care including fillings, crowns and bridges, complete and partial dentures, implants, gum treatment, root canals, and extractions. Preventive care is also provided. Treatment is provided to both adults and children. °Patients are selected via a lottery and there is often a waiting list. °  °[facility] [address], °[place] ° (336) 763-8833 [email or web address] °  °[facility] [address], [place], [place] (336)723-1848, Ext. 123 Second and Fourth Thursday of each month, opens at 6:30 AM; Clinic ends at 9 AM.  Patients are seen on a first-come first-served basis, and a limited number are seen during each clinic.  ° °[facility] ° [address], [place], [place] (336) 723-7904    Eligibility Requirements °You must have lived in [place], [place], or [place] counties for at least the last three months. °  You cannot be eligible for state or federal sponsored healthcare insurance, including Veterans Administration, Medicaid, or Medicare. °  You generally cannot be eligible for healthcare insurance through your employer.  °  How to apply: °Eligibility screenings are held every Tuesday and Wednesday afternoon from 1:00 pm until 4:00 pm. You do not need an appointment for the interview!  °[facility] [address], [place], [place] 336-631-2330   °[facility]  336-342-8273   °[facility]  336-703-3100   °[facility]  336-570-6415   ° °Behavioral Health Resources in the Community: °Intensive Outpatient Programs °Organization         Address  Phone  Notes  °[facility] [address], [place], [place] 336-878-6098   °[facility] [address], [place], [place] 336-832-9800   °[facility] [address], [place], [place] ° 336-882-2125   °[facility] [address],  °[place], [place] 1-800-853-5163 or 336-641-4981   °Substance Abuse Resources °Organization         Address  Phone  Notes  °[facility]  336-882-2125   °[facility]  336-784-9470   °[facility]  336-285-9073   °[facility]  336-845-3988   °Residential & Outpatient Substance Abuse Program  1-800-659-3381   °Psychological Services °Organization         Address  Phone  Notes  °[facility]  336- 832-9600   °[facility]  336- 378-7881   °[facility] [address], [place] 1-800-853-5163 or 336-641-4981   ° °Mobile Crisis Teams °Organization         Address  Phone  Notes  °[facility], Mobile Crisis Care Unit  1-877-626-1772   °Assertive °[facility] ° [address]  [place], [place] 336-834-9664   °Sharon DeEsch [address] °[place] 336-554-5454   ° °Self-Help/Support Groups °Organization         Address  Phone             Notes  °[facility]  - variety of support groups  336- 373-1402 Call for more information  °Narcotics Anonymous (NA), [facility] [address], °[place]  2 meetings at this location  ° °  Residential Treatment Programs Organization         Address  Phone  Notes  [facility] [address]    [place]  1-610-960-4540   [facility]  [address] [place] [address], [place], [place] 478-295-6213   [facility] [address] [place], [place] 086-578-4696 Admissions: 8am-3pm M-F  [facility] [address]    [place], [place] 295-284-1324   [facility] [address] [place], [place], [place] 401-027-2536   [facility] [address]  [place], [place] 644-034-7425   [facility] Intensive Outpatient [address], [place], [place] 956-387-5643   [facility] [address] [place]  [place], [place] 3-295-188-4166 or (757) 064-8215   [facility] ([facility] [address] [place], [place] 323-557-3220 Accepts Medicaid  [facility] [address],  [place] 2-542-706-2376 Substance Abuse/Addiction Treatment   [facility]         Address  Phone  Notes  [facility]  808-662-9289   Angie Fava, PhD [address] [place] [place] [place], [place]   431 420 4978 or 939-714-1146   [facility]   [address] [place], [place] (905)114-1095   [facility] [address] [place], [place] [phone] Insurance/Medicaid/sponsorship through [facility] [address]                                    [place], [place] 934-038-9942 Therapy/tele-psych/case    [facility] [address] [place], [place] 810-572-5173    Dr. Lolly Mustache  365-413-2980   [facility]  [facility] Dept. 1) [address] [place] 2) [address] [place] 3)  [address], [place] (340) [phone] 9021389891  408-820-5343   [facility] Child Abuse Hotline 352-259-9626 or 228-364-7049 (After Hours)      Take over the counter decongestant (such as sudafed), as directed on packaging, for the next week.  Use over the counter normal saline nasal sprayt, several times per day for the next 2 weeks. Take the prescription as directed.  Increase your fluid intake (ie:  Gatoraide) for the next few days.  Eat a bland diet and advance to your regular diet slowly as you can tolerate it.   Avoid full strength juices, as well as milk and milk products until your diarrhea has resolved. Use your albuterol inhaler (2 to 4 puffs) every 4 hours for the next 7 days, then as needed for cough, wheezing, or shortness of breath.  Call your regular medical doctor tomorrow morning to schedule a follow up appointment within the next 2 to 3 days.  Return to the Emergency Department immediately sooner if worsening.

## 2015-04-05 NOTE — ED Notes (Signed)
RT called

## 2015-04-05 NOTE — ED Notes (Signed)
Pt walked around the nurses station with no dizziness nor short of breath. His O2 was 96% room air walking around the nurses station.

## 2015-04-05 NOTE — ED Provider Notes (Signed)
CSN: [id number]     Arrival date & time 04/05/15  1223 History   First MD Initiated Contact with Patient 04/05/15 1620     Chief Complaint  Patient presents with  . Cough      HPI Pt was seen at 1635. Per pt, c/o gradual onset and persistence of multiple intermittent episodes of N/V/D for the past 3 days.  Describes the stools as "watery." Has been associated with cough, sore throat, runny/stuffy nose, CP, SOB, generalized body aches/fatigue. Denies abd pain, no palpitaitons, no back pain, no fevers, no black or blood in stools or emesis.     Past Medical History  Diagnosis Date  . Retinal vasculitis   . Condylomata acuminata in male     multiple procedures --  penile, peri-rectal , perineum  . GERD (gastroesophageal reflux disease)   . Chronic back pain   . Multiple sclerosis (HCC)     pt. states has 4 small brain lesions  . Arthritis   . Depression   . Hypertension     was on medication for short time, htn was caused by prednisone per pt  . Anxiety     Disabled due to panic attacks  . Headache(784.0)   . Fibromyalgia   . Emphysematous COPD (HCC)   . Dyspnea on exertion   . Borderline type 2 diabetes mellitus   . History of suicidal ideation     2006   adx  . History of panic attacks   . History of esophageal dilatation   . History of chronic pancreatitis     severeal adx for this /  2008  dx alcoholic pancreatitis  . Bilateral ureteral calculi   . Nephrolithiasis     bilateral   . History of kidney stones   . History of sepsis     adx 05-01-2014--  urosepsis due to kidney stones obstruction/ hydronephrosis  . BPH (benign prostatic hypertrophy)   . DDD (degenerative disc disease), cervical   . History of hiatal hernia    Past Surgical History  Procedure Laterality Date  . Wisdom tooth extraction    . Multiple extractions with alveoloplasty N/A 04/22/2014    Procedure: MULTIPLE EXTRACTIONS ( 2,3,5,6,7,8,9,10,11,13,14,15,21,28  WITH ALVEOLOPLASTY;  Surgeon: Ocie Doyne, DDS;  Location: [facility];  Service: Oral Surgery;  Laterality: N/A;  . Colonoscopy  10/08/2011    Jenkins:Normal colon/Anal condyloma without extension proximal to dentate line  . Flexible bronchoscopy N/A 08/12/2012    Procedure: FLEXIBLE BRONCHOSCOPY;  Surgeon: Fredirick Maudlin, MD;  Location: [facility];  Service: Pulmonary;  Laterality: N/A;  . 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 Abnormal gastric mucosa-status post biopsy  . Maloney dilation N/A 11/12/2012    Procedure: MALONEY DILATION (16mm);  Surgeon: Corbin Ade, MD;  Location: [facility];  Service: Endoscopy;  Laterality: N/A;  . Biopsy  11/12/2012    Procedure: GASTRIC AND ESOPHAGEAL BIOPSIES;  Surgeon: Corbin Ade, MD;  Location: [facility];  Service: Endoscopy;;  . Cystoscopy w/ ureteral stent placement Bilateral 05/01/2014    Procedure: CYSTOSCOPY WITH BILATERAL RETROGRADE PYELOGRAM; BILATERAL URETERAL STENT PLACEMENT;  Surgeon: Barron Alvine, MD;  Location: [facility];  Service: Urology;  Laterality: Bilateral;  . Electrocautery/ desiccation of condyloma lesions  01-15-2008  &  12-29-2009    PENIS, PERI-RECTAL , PERINUEM  . Cataract extraction w/ intraocular lens  implant, bilateral    . Cystoscopy with ureteroscopy and  stent placement Bilateral 06/06/2014    Procedure: CYSTOSCOPY WITH J2 STENT EXTRACTION,,URETEROSCOPY WITH EXTRACTION OF STONES,;  Surgeon: Marcine Matar, MD;  Location: [facility];  Service: Urology;  Laterality: Bilateral;  . Cystoscopy with ureteroscopy  06/06/2014    Procedure: CYSTOSCOPY WITH URETEROSCOPY;  Surgeon: Marcine Matar, MD;  Location: [facility];  Service: Urology;;  . Cystoscopy w/ ureteral stent removal Bilateral 06/06/2014    Procedure: CYSTOSCOPY WITH STENT REMOVAL;  Surgeon: Marcine Matar, MD;  Location: [facility];  Service: Urology;  Laterality: Bilateral;    Family History  Problem Relation Age of Onset  . Hypertension Mother   . Breast cancer Mother   . Melanoma Mother   . Stroke Mother   . Lung cancer Father   . Lung cancer Father 65  . Colon cancer Neg Hx   . Colitis Neg Hx   . Cirrhosis Neg Hx   . Liver disease Neg Hx   . Pancreatic cancer Neg Hx   . Pancreatitis Neg Hx   . Anesthesia problems Neg Hx   . Hypotension Neg Hx   . Malignant hyperthermia Neg Hx   . Pseudochol deficiency Neg Hx    Social History  Substance Use Topics  . Smoking status: Current Every Day Smoker -- 3.00 packs/day for 20 years    Types: Cigarettes  . Smokeless tobacco: None  . Alcohol Use: No     Comment: Stopped since Sept 2016    Review of Systems ROS: Statement: All systems negative except as marked or noted in the HPI; Constitutional: Negative for fever and chills. +generalized body aches/fatigue ; ; Eyes: Negative for eye pain, redness and discharge. ; ; ENMT: Negative for ear pain, hoarseness, +nasal congestion, sinus pressure and sore throat. ; ; Cardiovascular: Negative for chest pain, palpitations, diaphoresis, dyspnea and peripheral edema. ; ; Respiratory: +cough. Negative for wheezing and stridor. ; ; Gastrointestinal: +N/V/D. Negative for abdominal pain, blood in stool, hematemesis, jaundice and rectal bleeding. . ; ; Genitourinary: Negative for dysuria, flank pain and hematuria. ; ; Musculoskeletal: Negative for back pain and neck pain. Negative for swelling and trauma.; ; Skin: Negative for pruritus, rash, abrasions, blisters, bruising and skin lesion.; ; Neuro: Negative for headache, lightheadedness and neck stiffness. Negative for weakness, altered level of consciousness , altered mental status, extremity weakness, paresthesias, involuntary movement, seizure and syncope.       Allergies  Review of patient's allergies indicates no known allergies.  Home Medications   Prior to Admission medications   Medication Sig Start Date End Date  Taking? Authorizing Provider  ALPRAZolam Prudy Feeler) 1 MG tablet Take 1 mg by mouth 4 (four) times daily.   Yes Historical Provider, MD  ketorolac (ACULAR) 0.4 % SOLN Place 1 drop into both eyes 4 (four) times daily. 06/24/14  Yes Historical Provider, MD  omeprazole (PRILOSEC) 40 MG capsule Take 40 mg by mouth at bedtime.    Yes Historical Provider, MD  prednisoLONE acetate (PRED FORTE) 1 % ophthalmic suspension Apply 1 drop to eye 2 (two) times daily. 01/24/15  Yes Historical Provider, MD  predniSONE (DELTASONE) 5 MG tablet Take 5 mg by mouth daily. 02/22/15  Yes Historical Provider, MD  zolpidem (AMBIEN) 10 MG tablet Take 10 mg by mouth at bedtime.   Yes Historical Provider, MD  HYDROcodone-acetaminophen (NORCO/VICODIN) 5-325 MG tablet Take 2 tablets by mouth every 4 (four) hours as needed for moderate pain. 02/09/15   [name], MD  polyethylene glycol (  MIRALAX / GLYCOLAX) packet Take 17 g by mouth daily. Patient taking differently: Take 17 g by mouth daily as needed for mild constipation.  05/03/14   Erick Blinks, MD  promethazine (PHENERGAN) 25 MG tablet Take 1 tablet (25 mg total) by mouth every 6 (six) hours as needed for nausea or vomiting. Patient not taking: Reported on 04/05/2015 02/09/15   [name], MD   BP 99/71 mmHg  Pulse 87  Temp(Src) 98.4 F (36.9 C) (Oral)  Resp 18  Ht 5\' 7"  (1.702 m)  Wt 140 lb (63.504 kg)  BMI 21.92 kg/m2  SpO2 98%   17:12:08 Orthostatic Vital Signs SH  Orthostatic Lying  - BP- Lying: 95/80 mmHg ; Pulse- Lying: 78  Orthostatic Sitting - BP- Sitting: 107/86 mmHg ; Pulse- Sitting: 76  Orthostatic Standing at 0 minutes - BP- Standing at 0 minutes: 104/78 mmHg ; Pulse- Standing at 0 minutes: 89      Physical Exam 1640: Physical examination:  Nursing notes reviewed; Vital signs and O2 SAT reviewed;  Constitutional: Well developed, Well nourished, Well hydrated, In no acute distress; Head:  Normocephalic, atraumatic; Eyes: EOMI, PERRL,  No scleral icterus; ENMT: Mouth and pharynx normal, Mucous membranes moist. +edemetous nasal turbinates bilat with clear rhinorrhea. Mouth and pharynx without lesions. No tonsillar exudates. No intra-oral edema. No submandibular or sublingual edema. No hoarse voice, no drooling, no stridor. No pain with manipulation of larynx. No trismus. ; Neck: Supple, Full range of motion, No lymphadenopathy; Cardiovascular: Regular rate and rhythm, No gallop; Respiratory: Breath sounds coarse & equal bilaterally, No wheezes. +moist cough during exam.  Speaking full sentences with ease, Normal respiratory effort/excursion; Chest: Nontender, Movement normal; Abdomen: Soft, Nontender, Nondistended, Normal bowel sounds; Genitourinary: No CVA tenderness; Extremities: Pulses normal, No tenderness, No edema, No calf edema or asymmetry.; Neuro: AA&Ox3, Major CN grossly intact.  Speech clear. No gross focal motor or sensory deficits in extremities.; Skin: Color normal, Warm, Dry.    ED Course  Procedures (including critical care time) Labs Review  Imaging Review  I have personally reviewed and evaluated these images and lab results as part of my medical decision-making.   EKG Interpretation   Date/Time:  Wednesday April 05 2015 12:39:53 EST Ventricular Rate:  79 PR Interval:  134 QRS Duration: 96 QT Interval:  366 QTC Calculation: 419 R Axis:   61 Text Interpretation:  Normal sinus rhythm Septal infarct , age  undetermined Artifact When compared with ECG of 05/21/2010 QT has shortened  Otherwise no significant change Confirmed by [facility]  MD, Nicholos Johns [phone])  on 04/05/2015 4:59:26 PM      MDM  MDM Reviewed: nursing note, previous chart and vitals Reviewed previous: labs and ECG Interpretation: labs, ECG and x-ray      Results for orders placed or performed during the hospital encounter of 04/05/15  CBC with Differential  Result Value Ref Range   WBC 10.5 4.0 - 10.5 K/uL   RBC 4.87 4.22 - 5.81  MIL/uL   Hemoglobin 13.5 13.0 - 17.0 g/dL   HCT 60.4 54.0 - 98.1 %   MCV 84.0 78.0 - 100.0 fL   MCH 27.7 26.0 - 34.0 pg   MCHC 33.0 30.0 - 36.0 g/dL   RDW 19.1 (H) 47.8 - 29.5 %   Platelets 360 150 - 400 K/uL   Neutrophils Relative % 69 %   Neutro Abs 7.3 1.7 - 7.7 K/uL   Lymphocytes Relative 22 %   Lymphs Abs 2.3 0.7 - 4.0  K/uL   Monocytes Relative 8 %   Monocytes Absolute 0.8 0.1 - 1.0 K/uL   Eosinophils Relative 1 %   Eosinophils Absolute 0.1 0.0 - 0.7 K/uL   Basophils Relative 0 %   Basophils Absolute 0.0 0.0 - 0.1 K/uL  Basic metabolic panel  Result Value Ref Range   Sodium 136 135 - 145 mmol/L   Potassium 4.3 3.5 - 5.1 mmol/L   Chloride 103 101 - 111 mmol/L   CO2 24 22 - 32 mmol/L   Glucose, Bld 114 (H) 65 - 99 mg/dL   BUN 10 6 - 20 mg/dL   Creatinine, Ser 1.61 0.61 - 1.24 mg/dL   Calcium 8.8 (L) 8.9 - 10.3 mg/dL   GFR calc non Af Amer >60 >60 mL/min   GFR calc Af Amer >60 >60 mL/min   Anion gap 9 5 - 15  Hepatic function panel  Result Value Ref Range   Total Protein 6.8 6.5 - 8.1 g/dL   Albumin 3.5 3.5 - 5.0 g/dL   AST 22 15 - 41 U/L   ALT 17 17 - 63 U/L   Alkaline Phosphatase 75 38 - 126 U/L   Total Bilirubin 0.3 0.3 - 1.2 mg/dL   Bilirubin, Direct <0.9 (L) 0.1 - 0.5 mg/dL   Indirect Bilirubin NOT CALCULATED 0.3 - 0.9 mg/dL  Lipase, blood  Result Value Ref Range   Lipase 35 11 - 51 U/L  Troponin I  Result Value Ref Range   Troponin I <0.03 <0.031 ng/mL   Dg Chest 2 View 04/05/2015  CLINICAL DATA:  Chest pain with worsening shortness of breath and cough over the last 2 weeks. Smoking history. EXAM: CHEST  2 VIEW COMPARISON:  11/06/2014 FINDINGS: There is emphysema diffusely with focal scarring in the left upper lobe. No sign of active infiltrate, mass, effusion or collapse. No pneumothorax. No bone abnormality. IMPRESSION: Pronounced emphysema. Left upper lobe pulmonary scarring. No focal active process. Electronically Signed   By: Paulina Fusi M.D.   On:  04/05/2015 13:22    2100:  VS near baseline. Pt has tol PO well while in the ED without N/V.  No stooling while in the ED.  Abd remains benign, VSS. Short neb given with improvement in cough, lungs CTA bilat, no wheezing, Sats 98% R/A. Pt has ambulated around the nurses' station with steady gait, easy resps, NAD, Sats 96% R/A. Pt states he feels better and wants to go home now. Will dose MDI before d/c. Tx symptomatically at this time. Dx and testing d/w pt.  Questions answered.  Verb understanding, agreeable to d/c home with outpt f/u.    Samuel Jester, DO 04/08/15 2027

## 2015-10-07 IMAGING — CT CT HEAD W/O CM
1 series · 16 of 30 positions shown, 20 images · non-contrast
Comparison: None.

CLINICAL DATA: Fall.  Uncertain if loss of consciousness.

EXAM:
CT HEAD WITHOUT CONTRAST
TECHNIQUE: Contiguous axial images were obtained from the base of the skull
through the vertex without intravenous contrast.

[Series 2: headtrauma 4.8 h37s · axial · 0.47mm/px · z∈[+148,+283]mm · 16 of 30 slices shown, 20 images]
[im 2/30  brain]
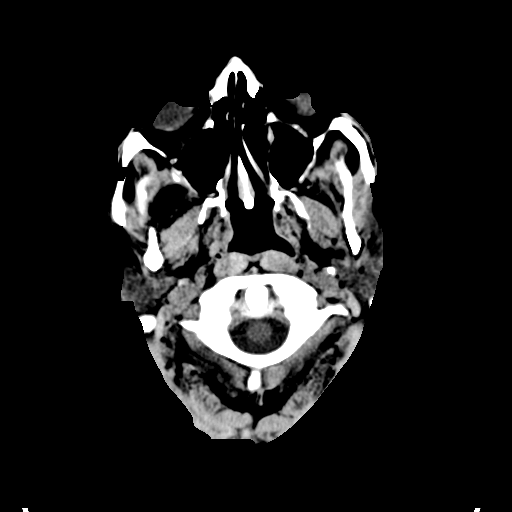
[im 2/30  bone]
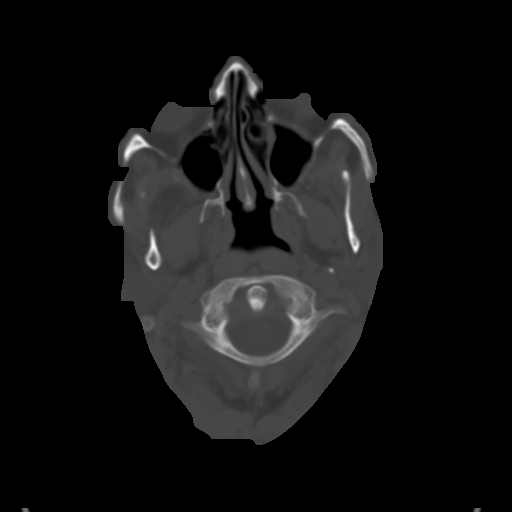
[im 4/30  brain]
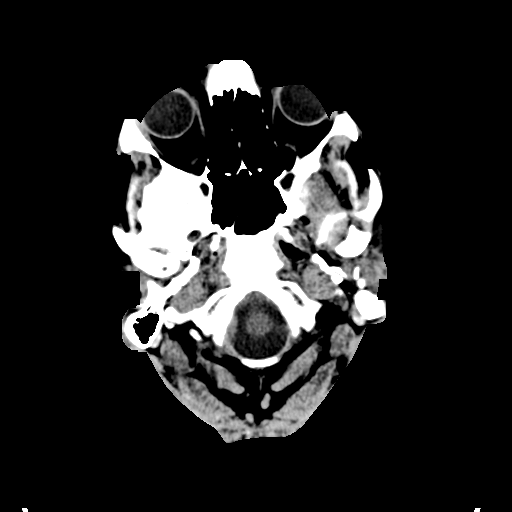
[im 6/30  brain]
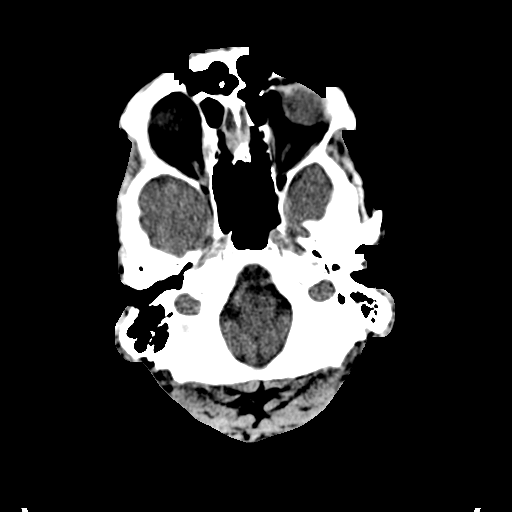
[im 8/30  brain]
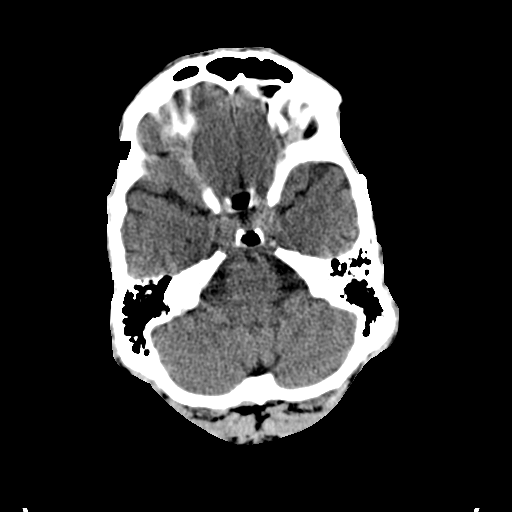
[im 9/30  brain]
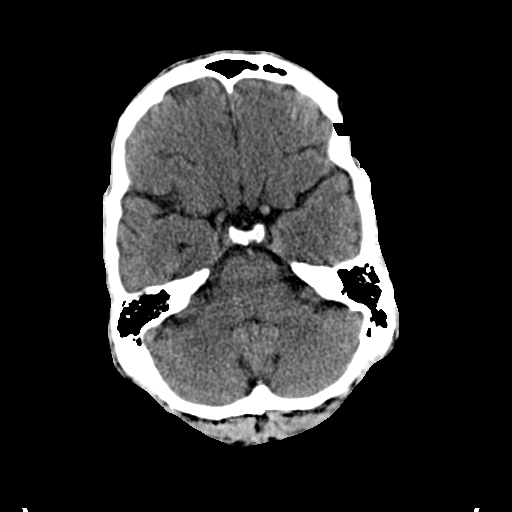
[im 9/30  bone]
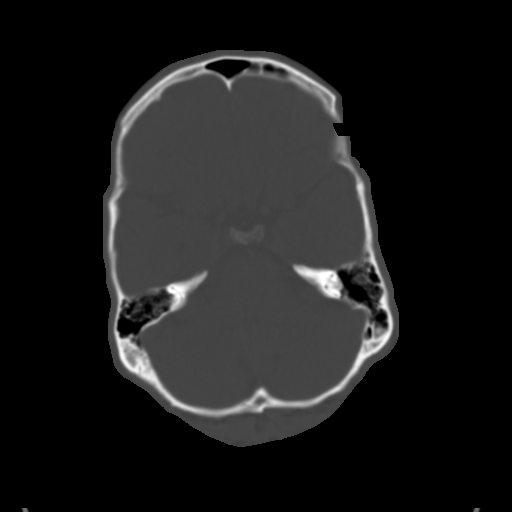
[im 11/30  brain]
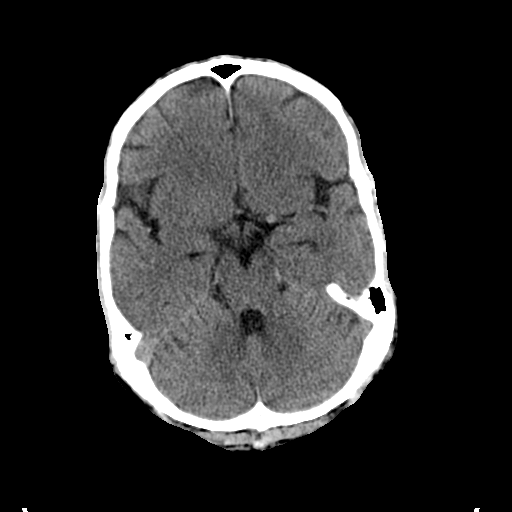
[im 13/30  brain]
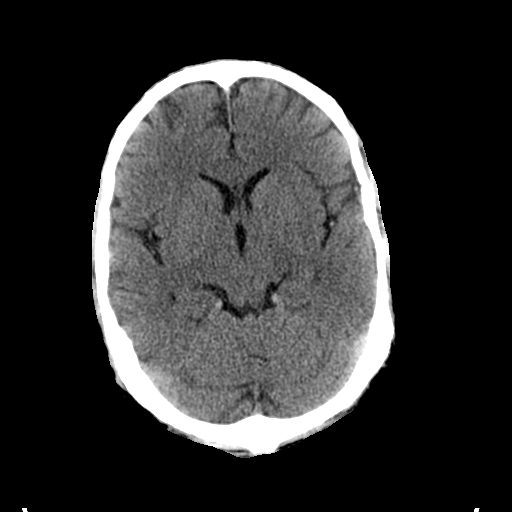
[im 15/30  brain]
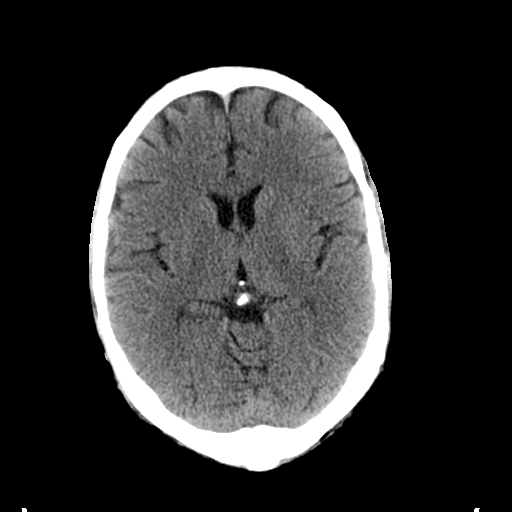
[im 16/30  brain]
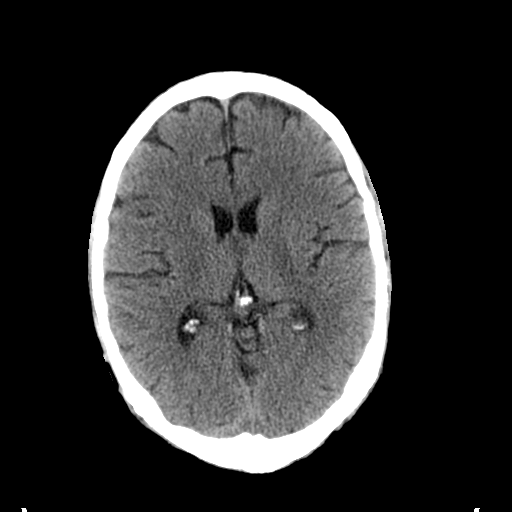
[im 16/30  bone]
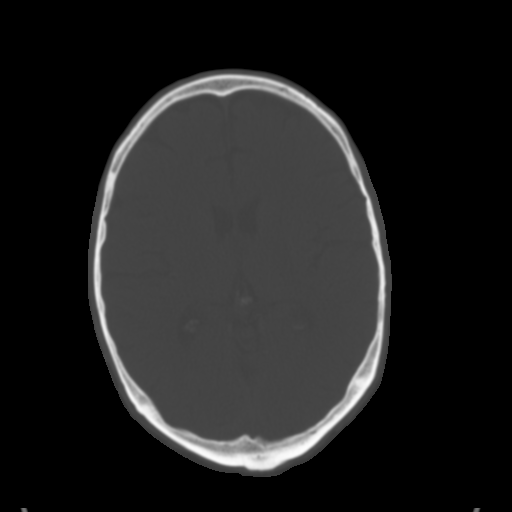
[im 18/30  brain]
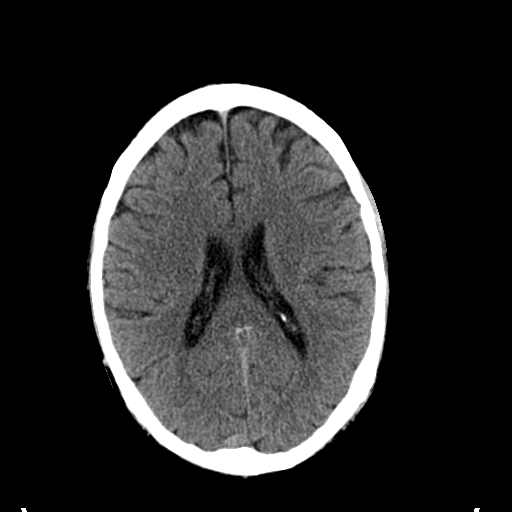
[im 20/30  brain]
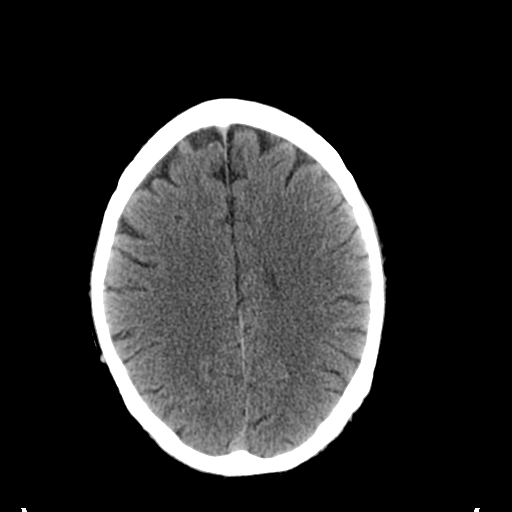
[im 22/30  brain]
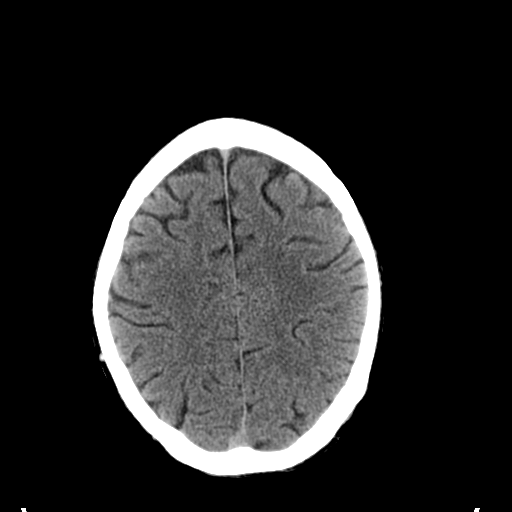
[im 23/30  brain]
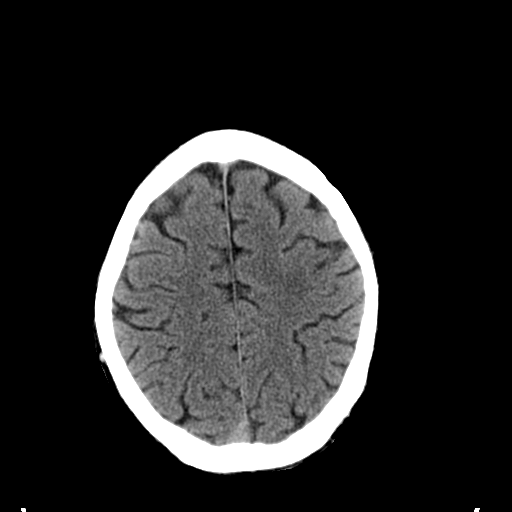
[im 23/30  bone]
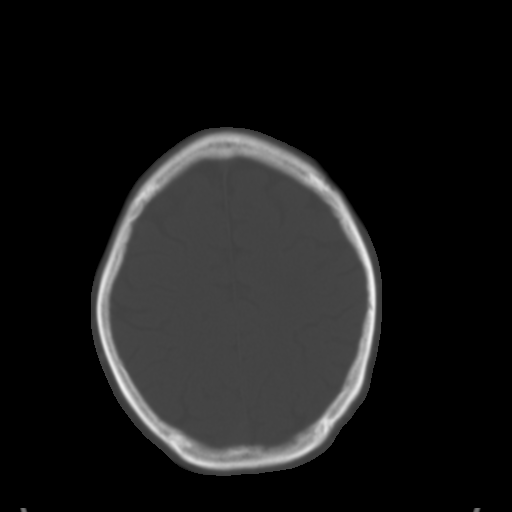
[im 25/30  brain]
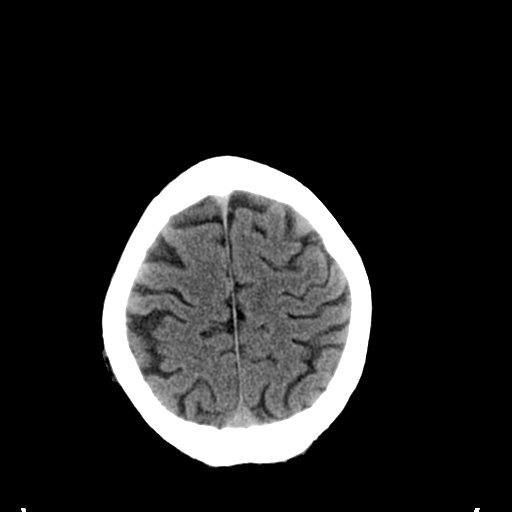
[im 27/30  brain]
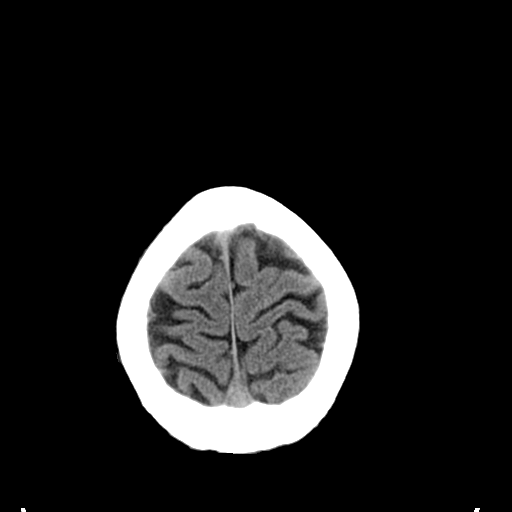
[im 29/30  brain]
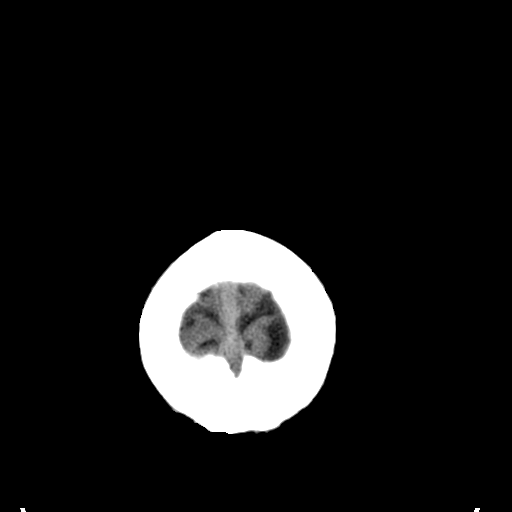

[16 of 30 positions shown; findings below may reference images not displayed]

FINDINGS: Bony calvarium appears intact. No mass effect or midline shift is
noted. Ventricular size is within normal limits. There is no
evidence of mass lesion, hemorrhage or acute infarction.
IMPRESSION: Normal head CT.

## 2015-11-06 ENCOUNTER — Other Ambulatory Visit: Payer: Self-pay | Admitting: Family Medicine

## 2015-11-06 DIAGNOSIS — G8929 Other chronic pain: Secondary | ICD-10-CM

## 2015-11-06 DIAGNOSIS — M545 Low back pain: Principal | ICD-10-CM

## 2016-01-24 ENCOUNTER — Ambulatory Visit
Admission: RE | Admit: 2016-01-24 | Discharge: 2016-01-24 | Disposition: A | Discharge status: Home / Self Care | Payer: Medicare Other | Source: Ambulatory Visit | Attending: Family Medicine | Admitting: Family Medicine

## 2016-01-24 DIAGNOSIS — G8929 Other chronic pain: Secondary | ICD-10-CM

## 2016-01-24 DIAGNOSIS — M545 Low back pain: Principal | ICD-10-CM

## 2016-01-24 MED ORDER — IOPAMIDOL (ISOVUE-M 200) INJECTION 41%
1.0000 mL | Freq: Once | INTRAMUSCULAR | Status: AC
Start: 2016-01-24 — End: 2016-01-24
  Administered 2016-01-24: 1 mL via EPIDURAL

## 2016-01-24 MED ORDER — METHYLPREDNISOLONE ACETATE 40 MG/ML INJ SUSP (RADIOLOG
120.0000 mg | Freq: Once | INTRAMUSCULAR | Status: AC
Start: 2016-01-24 — End: 2016-01-24
  Administered 2016-01-24: 120 mg via EPIDURAL

## 2016-01-24 NOTE — Discharge Instructions (Signed)

## 2016-02-18 ENCOUNTER — Encounter (HOSPITAL_COMMUNITY): Payer: Self-pay | Admitting: *Deleted

## 2016-02-18 ENCOUNTER — Emergency Department (HOSPITAL_COMMUNITY): Payer: Medicare Other

## 2016-02-18 ENCOUNTER — Inpatient Hospital Stay (HOSPITAL_COMMUNITY)
Admission: EM | Admit: 2016-02-18 | Discharge: 2016-02-21 | DRG: 440 | Discharge status: Against Medical Advice | Payer: Medicare Other | Attending: Internal Medicine | Admitting: Internal Medicine

## 2016-02-18 DIAGNOSIS — G35 Multiple sclerosis: Secondary | ICD-10-CM | POA: Diagnosis present

## 2016-02-18 DIAGNOSIS — F1010 Alcohol abuse, uncomplicated: Secondary | ICD-10-CM | POA: Diagnosis present

## 2016-02-18 DIAGNOSIS — K859 Acute pancreatitis without necrosis or infection, unspecified: Secondary | ICD-10-CM

## 2016-02-18 DIAGNOSIS — M797 Fibromyalgia: Secondary | ICD-10-CM | POA: Diagnosis present

## 2016-02-18 DIAGNOSIS — M549 Dorsalgia, unspecified: Secondary | ICD-10-CM

## 2016-02-18 DIAGNOSIS — Z7952 Long term (current) use of systemic steroids: Secondary | ICD-10-CM

## 2016-02-18 DIAGNOSIS — K852 Alcohol induced acute pancreatitis without necrosis or infection: Principal | ICD-10-CM | POA: Diagnosis present

## 2016-02-18 DIAGNOSIS — Z79899 Other long term (current) drug therapy: Secondary | ICD-10-CM

## 2016-02-18 DIAGNOSIS — F419 Anxiety disorder, unspecified: Secondary | ICD-10-CM | POA: Diagnosis present

## 2016-02-18 DIAGNOSIS — G8929 Other chronic pain: Secondary | ICD-10-CM | POA: Diagnosis present

## 2016-02-18 DIAGNOSIS — R1013 Epigastric pain: Secondary | ICD-10-CM | POA: Diagnosis not present

## 2016-02-18 DIAGNOSIS — K219 Gastro-esophageal reflux disease without esophagitis: Secondary | ICD-10-CM | POA: Diagnosis present

## 2016-02-18 DIAGNOSIS — Z720 Tobacco use: Secondary | ICD-10-CM | POA: Diagnosis present

## 2016-02-18 DIAGNOSIS — R1010 Upper abdominal pain, unspecified: Secondary | ICD-10-CM

## 2016-02-18 LAB — COMPREHENSIVE METABOLIC PANEL
ALT: 33 U/L (ref 17–63)
ANION GAP: 7 (ref 5–15)
AST: 22 U/L (ref 15–41)
Albumin: 3.6 g/dL (ref 3.5–5.0)
Alkaline Phosphatase: 92 U/L (ref 38–126)
BUN: 7 mg/dL (ref 6–20)
CALCIUM: 8.7 mg/dL — AB (ref 8.9–10.3)
CO2: 27 mmol/L (ref 22–32)
Chloride: 103 mmol/L (ref 101–111)
Creatinine, Ser: 0.67 mg/dL (ref 0.61–1.24)
GFR calc non Af Amer: >60 mL/min (ref >60)
GLUCOSE: 104 mg/dL — AB (ref 65–99)
Potassium: 3.5 mmol/L (ref 3.5–5.1)
SODIUM: 137 mmol/L (ref 135–145)
TOTAL PROTEIN: 6.1 g/dL — AB (ref 6.5–8.1)
Total Bilirubin: 0.7 mg/dL (ref 0.3–1.2)

## 2016-02-18 LAB — CBC WITH DIFFERENTIAL/PLATELET
BASOS ABS: 0 10*3/uL (ref 0.0–0.1)
BASOS PCT: 0 %
EOS PCT: 1 %
Eosinophils Absolute: 0.1 10*3/uL (ref 0.0–0.7)
HEMATOCRIT: 45.6 % (ref 39.0–52.0)
Hemoglobin: 15.3 g/dL (ref 13.0–17.0)
Lymphocytes Relative: 13 %
Lymphs Abs: 1.6 10*3/uL (ref 0.7–4.0)
MCH: 32.5 pg (ref 26.0–34.0)
MCHC: 33.6 g/dL (ref 30.0–36.0)
MCV: 96.8 fL (ref 78.0–100.0)
MONO ABS: 0.7 10*3/uL (ref 0.1–1.0)
Monocytes Relative: 6 %
NEUTROS ABS: 10.2 10*3/uL — AB (ref 1.7–7.7)
Neutrophils Relative %: 80 %
PLATELETS: 274 10*3/uL (ref 150–400)
RBC: 4.71 MIL/uL (ref 4.22–5.81)
RDW: 13.9 % (ref 11.5–15.5)
WBC: 12.6 10*3/uL — ABNORMAL HIGH (ref 4.0–10.5)

## 2016-02-18 LAB — LIPASE, BLOOD: LIPASE: 141 U/L — AB (ref 11–51)

## 2016-02-18 MED ORDER — ONDANSETRON HCL 4 MG/2ML IJ SOLN
4.0000 mg | Freq: Once | INTRAMUSCULAR | Status: AC
  Administered 2016-02-18: 4 mg via INTRAVENOUS
  Filled 2016-02-18: qty 2

## 2016-02-18 MED ORDER — SODIUM CHLORIDE 0.9 % IV BOLUS (SEPSIS)
1000.0000 mL | Freq: Once | INTRAVENOUS | Status: AC
  Administered 2016-02-18: 1000 mL via INTRAVENOUS

## 2016-02-18 MED ORDER — HYDROMORPHONE HCL 1 MG/ML IJ SOLN
0.5000 mg | Freq: Once | INTRAMUSCULAR | Status: AC
  Administered 2016-02-18: 0.5 mg via INTRAVENOUS
  Filled 2016-02-18: qty 1

## 2016-02-18 MED ORDER — PANTOPRAZOLE SODIUM 40 MG IV SOLR
40.0000 mg | Freq: Once | INTRAVENOUS | Status: AC
  Administered 2016-02-18: 40 mg via INTRAVENOUS
  Filled 2016-02-18: qty 40

## 2016-02-18 MED ORDER — HYDROMORPHONE HCL 1 MG/ML IJ SOLN
1.0000 mg | Freq: Once | INTRAMUSCULAR | Status: AC
  Administered 2016-02-18: 1 mg via INTRAVENOUS
  Filled 2016-02-18: qty 1

## 2016-02-18 NOTE — H&P (Signed)
History and Physical    NYCERE PRESLEY UJW:[id number] DOB: Jan 10, 1971 DOA: 02/18/2016  PCP: Milana Obey, MD  Patient coming from: Home.    Chief Complaint: epigastric pain, nausea and vomiting for several days after drinking alcohol.   HPI: DRAYVEN MARCHENA is an 46 y.o. male with hx of prior alcohol pancreatitis, uveitis on steroid eye drops and previously on longterm steroid, but off for several years, hx of anxiety, tobacco abuse, presented to the ER with epigastic pain, nausea, vomiting for several days.  He also takes one oxycodone 10mg  per day for his low back pain.  He has not been able to eat or drink.  Evaluation in the ER included a lipase of 145, WBC of 12K, calcium of 8.7, Hb of 15.3.  He denied daily alcohol use, but said he drank for xmas and Newyear.  He has no other complaints.  Hospitalist was asked to admit him for acute alcoholic pancreatitis.     ED Course:  See above.  Rewiew of Systems:  Constitutional: Negative for malaise, fever and chills. No significant weight loss or weight gain Eyes: Negative for eye pain, redness and discharge, diplopia, visual changes, or flashes of light. ENMT: Negative for ear pain, hoarseness, nasal congestion, sinus pressure and sore throat. No headaches; tinnitus, drooling, or problem swallowing. Cardiovascular: Negative for chest pain, palpitations, diaphoresis, dyspnea and peripheral edema. ; No orthopnea, PND Respiratory: Negative for cough, hemoptysis, wheezing and stridor. No pleuritic chestpain. Gastrointestinal: Negative for diarrhea, constipation,  melena, blood in stool, hematemesis, jaundice and rectal bleeding.    Genitourinary: Negative for frequency, dysuria, incontinence,flank pain and hematuria; Musculoskeletal: Negative for back pain and neck pain. Negative for swelling and trauma.;  Skin: . Negative for pruritus, rash, abrasions, bruising and skin lesion.; ulcerations Neuro: Negative for headache, lightheadedness and  neck stiffness. Negative for weakness, altered level of consciousness , altered mental status, extremity weakness, burning feet, involuntary movement, seizure and syncope.  Psych: negative for anxiety, depression, insomnia, tearfulness, panic attacks, hallucinations, paranoia, suicidal or homicidal ideation   Past Medical History:  Diagnosis Date  . Anxiety    Disabled due to panic attacks  . Arthritis   . Bilateral ureteral calculi   . Borderline type 2 diabetes mellitus   . BPH (benign prostatic hypertrophy)   . Chronic back pain   . Condylomata acuminata in male    multiple procedures --  penile, peri-rectal , perineum  . DDD (degenerative disc disease), cervical   . Depression   . Dyspnea on exertion   . Emphysematous COPD (HCC)   . Fibromyalgia   . GERD (gastroesophageal reflux disease)   . Headache(784.0)   . History of chronic pancreatitis    severeal adx for this /  2008  dx alcoholic pancreatitis  . History of esophageal dilatation   . History of hiatal hernia   . History of kidney stones   . History of panic attacks   . History of sepsis    adx 05-01-2014--  urosepsis due to kidney stones obstruction/ hydronephrosis  . History of suicidal ideation    2006   adx  . Hypertension    was on medication for short time, htn was caused by prednisone per pt  . Multiple sclerosis (HCC)    pt. states has 4 small brain lesions  . Nephrolithiasis    bilateral   . Retinal vasculitis     Past Surgical History:  Procedure Laterality Date  . BIOPSY  11/12/2012  Procedure: GASTRIC AND ESOPHAGEAL BIOPSIES;  Surgeon: Corbin Ade, MD;  Location: [facility];  Service: Endoscopy;;  . CATARACT EXTRACTION W/ INTRAOCULAR LENS  IMPLANT, BILATERAL    . COLONOSCOPY  10/08/2011   Jenkins:Normal colon/Anal condyloma without extension proximal to dentate line  . CYSTOSCOPY W/ URETERAL STENT PLACEMENT Bilateral 05/01/2014   Procedure: CYSTOSCOPY WITH BILATERAL RETROGRADE PYELOGRAM; BILATERAL  URETERAL STENT PLACEMENT;  Surgeon: Barron Alvine, MD;  Location: [facility];  Service: Urology;  Laterality: Bilateral;  . CYSTOSCOPY W/ URETERAL STENT REMOVAL Bilateral 06/06/2014   Procedure: CYSTOSCOPY WITH STENT REMOVAL;  Surgeon: Marcine Matar, MD;  Location: [facility];  Service: Urology;  Laterality: Bilateral;  . CYSTOSCOPY WITH URETEROSCOPY  06/06/2014   Procedure: CYSTOSCOPY WITH URETEROSCOPY;  Surgeon: Marcine Matar, MD;  Location: [facility];  Service: Urology;;  . Bluford Kaufmann WITH URETEROSCOPY AND STENT PLACEMENT Bilateral 06/06/2014   Procedure: CYSTOSCOPY WITH J2 STENT EXTRACTION,,URETEROSCOPY WITH EXTRACTION OF STONES,;  Surgeon: Marcine Matar, MD;  Location: [facility];  Service: Urology;  Laterality: Bilateral;  . ELECTROCAUTERY/ DESICCATION OF CONDYLOMA LESIONS  01-15-2008  &  12-29-2009   PENIS, PERI-RECTAL , PERINUEM  . ESOPHAGOGASTRODUODENOSCOPY (EGD) WITH PROPOFOL N/A 11/12/2012   [id number] esophagus s/p  passage of a Maloney dilator and biopsy. Abnormal gastric mucosa-status post biopsy  . FLEXIBLE BRONCHOSCOPY N/A 08/12/2012   Procedure: FLEXIBLE BRONCHOSCOPY;  Surgeon: Fredirick Maudlin, MD;  Location: [facility];  Service: Pulmonary;  Laterality: N/A;  . MALONEY DILATION N/A 11/12/2012   Procedure: MALONEY DILATION (65mm);  Surgeon: Corbin Ade, MD;  Location: [facility];  Service: Endoscopy;  Laterality: N/A;  . MULTIPLE EXTRACTIONS WITH ALVEOLOPLASTY N/A 04/22/2014   Procedure: MULTIPLE EXTRACTIONS ( 2,3,5,6,7,8,9,10,11,13,14,15,21,28  WITH ALVEOLOPLASTY;  Surgeon: Ocie Doyne, DDS;  Location: [facility];  Service: Oral Surgery;  Laterality: N/A;  . WISDOM TOOTH EXTRACTION       reports that he has been smoking Cigarettes.  He has a 60.00 pack-year smoking history. He has never used smokeless tobacco. He reports that he drinks alcohol. He reports that he does not use drugs.  No Known Allergies  Family History  Problem  Relation Age of Onset  . Hypertension Mother   . Breast cancer Mother   . Melanoma Mother   . Stroke Mother   . Lung cancer Father 60  . Colon cancer Neg Hx   . Colitis Neg Hx   . Cirrhosis Neg Hx   . Liver disease Neg Hx   . Pancreatic cancer Neg Hx   . Pancreatitis Neg Hx   . Anesthesia problems Neg Hx   . Hypotension Neg Hx   . Malignant hyperthermia Neg Hx   . Pseudochol deficiency Neg Hx      Prior to Admission medications   Medication Sig Start Date End Date Taking? Authorizing Provider  ALPRAZolam Prudy Feeler) 1 MG tablet Take 1 mg by mouth 4 (four) times daily.   Yes Historical Provider, MD  HYDROcodone-acetaminophen (NORCO) 10-325 MG tablet Take 1 tablet by mouth daily. 01/03/16  Yes Historical Provider, MD  ketorolac (ACULAR) 0.4 % SOLN Place 1 drop into both eyes 4 (four) times daily. 06/24/14  Yes Historical Provider, MD  omeprazole (PRILOSEC) 40 MG capsule Take 40 mg by mouth at bedtime.    Yes Historical Provider, MD  prednisoLONE acetate (PRED FORTE) 1 % ophthalmic suspension Apply 1 drop to eye 2 (two) times daily. 01/24/15  Yes Historical Provider, MD  predniSONE (DELTASONE) 5 MG tablet Take  5 mg by mouth daily. 02/22/15  Yes Historical Provider, MD  zolpidem (AMBIEN) 10 MG tablet Take 10 mg by mouth at bedtime.   Yes Historical Provider, MD    Physical Exam: Vitals:   02/18/16 2115 02/18/16 2130 02/18/16 2200 02/18/16 2215  BP:   136/89   Pulse: 68 82 73 71  Resp:      Temp:      SpO2: 97% 96% 96% 95%  Weight:      Height:          Constitutional: NAD, calm, comfortable Vitals:   02/18/16 2115 02/18/16 2130 02/18/16 2200 02/18/16 2215  BP:   136/89   Pulse: 68 82 73 71  Resp:      Temp:      SpO2: 97% 96% 96% 95%  Weight:      Height:       Eyes: PERRL, lids and conjunctivae normal ENMT: Mucous membranes are moist. Posterior pharynx clear of any exudate or lesions.Normal dentition.  Neck: normal, supple, no masses, no thyromegaly Respiratory: clear  to auscultation bilaterally, no wheezing, no crackles. Normal respiratory effort. No accessory muscle use.  Cardiovascular: Regular rate and rhythm, no murmurs / rubs / gallops. No extremity edema. 2+ pedal pulses. No carotid bruits.  Abdomen: epigastric tenderness.  no masses palpated. No hepatosplenomegaly. Bowel sounds positive.  Musculoskeletal: no clubbing / cyanosis. No joint deformity upper and lower extremities. Good ROM, no contractures. Normal muscle tone.  Skin: no rashes, lesions, ulcers. No induration Neurologic: CN 2-12 grossly intact. Sensation intact, DTR normal. Strength 5/5 in all 4.  Psychiatric: Normal judgment and insight. Alert and oriented x 3. Normal mood.     Labs on Admission: I have personally reviewed following labs and imaging studies  CBC:  Recent Labs Lab 02/18/16 2102  WBC 12.6*  NEUTROABS 10.2*  HGB 15.3  HCT 45.6  MCV 96.8  PLT 274   Basic Metabolic Panel:  Recent Labs Lab 02/18/16 2102  NA 137  K 3.5  CL 103  CO2 27  GLUCOSE 104*  BUN 7  CREATININE 0.67  CALCIUM 8.7*   GFR: Estimated Creatinine Clearance: 91.5 mL/min (by C-G formula based on SCr of 0.67 mg/dL). Liver Function Tests:  Recent Labs Lab 02/18/16 2102  AST 22  ALT 33  ALKPHOS 92  BILITOT 0.7  PROT 6.1*  ALBUMIN 3.6    Recent Labs Lab 02/18/16 2102  LIPASE 141*   Urine analysis:    Component Value Date/Time   COLORURINE YELLOW 04/05/2015 1950   APPEARANCEUR CLEAR 04/05/2015 1950   LABSPEC 1.015 04/05/2015 1950   PHURINE 5.0 04/05/2015 1950   GLUCOSEU NEGATIVE 04/05/2015 1950   HGBUR NEGATIVE 04/05/2015 1950   BILIRUBINUR NEGATIVE 04/05/2015 1950   KETONESUR NEGATIVE 04/05/2015 1950   PROTEINUR NEGATIVE 04/05/2015 1950   UROBILINOGEN 0.2 11/06/2014 2345   NITRITE NEGATIVE 04/05/2015 1950   LEUKOCYTESUR NEGATIVE 04/05/2015 1950     Radiological Exams on Admission: Dg Abd Acute W/chest  Result Date: 02/18/2016 CLINICAL DATA:  Left upper abdominal  pain. EXAM: DG ABDOMEN ACUTE W/ 1V CHEST COMPARISON:  Chest x-ray April 05, 2015 FINDINGS: Scarring is seen in the left apex, unchanged. No pneumothorax. The lungs are otherwise clear. The cardiomediastinal silhouette is normal. No free air, portal venous gas, or pneumatosis. The bowel gas pattern is nonobstructive. IMPRESSION: Negative abdominal radiographs.  No acute cardiopulmonary disease. Electronically Signed   By: Gerome Sam III M.D   On: 02/18/2016 21:52  EKG: Independently reviewed.  Assessment/Plan Principal Problem:   Acute alcoholic pancreatitis Active Problems:   ETOH abuse   Chronic back pain   Anxiety   Tobacco abuse   Pancreatitis, alcoholic, acute    PLAN:   Acute alcoholic pancreatitis:  Will make NPO, give ample of IVF, and will give antiemetic and analgesic.  Follow clinically and advance his diet starting tomorrow as tolerated.   Anxiety:  Will give IV Ativan PRN.  GERD:  Will continue with PPI given IV for now.  Alcohol and tobacco use:  Advised stop.   DVT prophylaxis: Lovenox. Code Status: FULL CODE.  Family Communication: None at bedside.   Disposition Plan: To home when better.  Consults called: None.  Admission status: OBS.   Vanilla Heatherington MD FACP. [facility]  If 7PM-7AM, please contact night-coverage [email or web address] Password [email or web address]  02/18/2016, 11:10 PM

## 2016-02-18 NOTE — ED Triage Notes (Signed)
Pt c/o left upper abd pain that started yesterday with n/v, has hx of pancreatitis and admits to etoh use on new years eve

## 2016-02-18 NOTE — ED Provider Notes (Signed)
[facility] Provider Note   CSN: [id number] Arrival date & time: 02/18/16  2056  By signing my name below, I, Rosario Adie, attest that this documentation has been prepared under the direction and in the presence of Bethann Berkshire, MD. Electronically Signed: Rosario Adie, ED Scribe. 02/18/16. 9:13 PM.  History   Chief Complaint Chief Complaint  Patient presents with  . Abdominal Pain   The history is provided by the patient and medical records. No language interpreter was used.  Abdominal Pain   This is a recurrent problem. The current episode started yesterday. The problem occurs constantly. The problem has been gradually worsening. The pain is associated with alcohol use. The pain is located in the epigastric region. The quality of the pain is sharp. The pain is at a severity of 10/10. Associated symptoms include nausea and vomiting. Pertinent negatives include diarrhea, frequency, hematuria and headaches. Nothing relieves the symptoms. Past workup includes CT scan.    HPI Comments: Brent [name] is a 46 y.o. male with a PMHx of acoholic pancreatitis, MS, and alcohol abuse, who presents to the Emergency Department complaining of gradually worsening, persistent epigastric abdominal pain beginning yesterday. He reports associated nausea and several episodes of emesis secondary to his abdominal pain. Pt reports that he has a h/o alcoholic pancreatitis and his pain today is similar to this. Per prior chart review, pt was last admitted for his pancreatitis on 11/06/14 (approximately 1.25 years ago). He states that his last alcoholic drink was consumed yesterday. Pt has been taking analgesics at home without relief. No other associated symptoms or complaints at this time.   Past Medical History:  Diagnosis Date  . Anxiety    Disabled due to panic attacks  . Arthritis   . Bilateral ureteral calculi   . Borderline type 2 diabetes mellitus   . BPH (benign prostatic  hypertrophy)   . Chronic back pain   . Condylomata acuminata in male    multiple procedures --  penile, peri-rectal , perineum  . DDD (degenerative disc disease), cervical   . Depression   . Dyspnea on exertion   . Emphysematous COPD (HCC)   . Fibromyalgia   . GERD (gastroesophageal reflux disease)   . Headache(784.0)   . History of chronic pancreatitis    severeal adx for this /  2008  dx alcoholic pancreatitis  . History of esophageal dilatation   . History of hiatal hernia   . History of kidney stones   . History of panic attacks   . History of sepsis    adx 05-01-2014--  urosepsis due to kidney stones obstruction/ hydronephrosis  . History of suicidal ideation    2006   adx  . Hypertension    was on medication for short time, htn was caused by prednisone per pt  . Multiple sclerosis (HCC)    pt. states has 4 small brain lesions  . Nephrolithiasis    bilateral   . Retinal vasculitis    Patient Active Problem List   Diagnosis Date Noted  . Acute alcoholic pancreatitis 11/07/2014  . Bilateral ureteral calculi   . UTI (lower urinary tract infection)   . Ureteral calculi 05/02/2014  . Ureterolithiasis 05/01/2014  . UTI (urinary tract infection) 05/01/2014  . Sepsis (HCC) 05/01/2014  . Hydronephrosis 05/01/2014  . Tobacco abuse 05/01/2014  . Failure to thrive (0-17) 01/22/2014  . Melena 11/03/2012  . Odynophagia 11/03/2012  . Esophageal dysphagia 11/03/2012  . ETOH abuse 08/31/2011  .  Chronic back pain   . Anxiety   . Weight loss, abnormal 05/04/2010  . Epigastric pain 05/04/2010  . Rectal bleeding 05/04/2010  . Rectal pain 05/04/2010   Past Surgical History:  Procedure Laterality Date  . BIOPSY  11/12/2012   Procedure: GASTRIC AND ESOPHAGEAL BIOPSIES;  Surgeon: Corbin Ade, MD;  Location: [facility];  Service: Endoscopy;;  . CATARACT EXTRACTION W/ INTRAOCULAR LENS  IMPLANT, BILATERAL    . COLONOSCOPY  10/08/2011   Jenkins:Normal colon/Anal condyloma without  extension proximal to dentate line  . CYSTOSCOPY W/ URETERAL STENT PLACEMENT Bilateral 05/01/2014   Procedure: CYSTOSCOPY WITH BILATERAL RETROGRADE PYELOGRAM; BILATERAL URETERAL STENT PLACEMENT;  Surgeon: Barron Alvine, MD;  Location: [facility];  Service: Urology;  Laterality: Bilateral;  . CYSTOSCOPY W/ URETERAL STENT REMOVAL Bilateral 06/06/2014   Procedure: CYSTOSCOPY WITH STENT REMOVAL;  Surgeon: Marcine Matar, MD;  Location[facility];  Service: Urology;  Laterality: Bilateral;  . CYSTOSCOPY WITH URETEROSCOPY  06/06/2014   Procedure: CYSTOSCOPY WITH URETEROSCOPY;  Surgeon: Marcine Matar, MD;  Location: [facility];  Service: Urology;;  . Bluford Kaufmann WITH URETEROSCOPY AND STENT PLACEMENT Bilateral 06/06/2014   Procedure: CYSTOSCOPY WITH J2 STENT EXTRACTION,,URETEROSCOPY WITH EXTRACTION OF STONES,;  Surgeon: Marcine Matar, MD;  Location[facility];  Service: Urology;  Laterality: Bilateral;  . ELECTROCAUTERY/ DESICCATION OF CONDYLOMA LESIONS  01-15-2008  &  12-29-2009   PENIS, PERI-RECTAL , PERINUEM  . 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 Abnormal gastric mucosa-status post biopsy  . FLEXIBLE BRONCHOSCOPY N/A 08/12/2012   Procedure: FLEXIBLE BRONCHOSCOPY;  Surgeon: Fredirick Maudlin, MD;  Location: [facility];  Service: Pulmonary;  Laterality: N/A;  . MALONEY DILATION N/A 11/12/2012   Procedure: MALONEY DILATION (54mm);  Surgeon: Corbin Ade, MD;  Location: [facility];  Service: Endoscopy;  Laterality: N/A;  . MULTIPLE EXTRACTIONS WITH ALVEOLOPLASTY N/A 04/22/2014   Procedure: MULTIPLE EXTRACTIONS ( 2,3,5,6,7,8,9,10,11,13,14,15,21,28  WITH ALVEOLOPLASTY;  Surgeon: Ocie Doyne, DDS;  Location: [facility];  Service: Oral Surgery;  Laterality: N/A;  . WISDOM TOOTH EXTRACTION      Home Medications    Prior to Admission medications   Medication Sig Start Date End  Date Taking? Authorizing Provider  ALPRAZolam Prudy Feeler) 1 MG tablet Take 1 mg by mouth 4 (four) times daily.    Historical Provider, MD  benzonatate (TESSALON) 100 MG capsule Take 1 capsule (100 mg total) by mouth 3 (three) times daily as needed for cough. 04/05/15   Samuel Jester, DO  HYDROcodone-acetaminophen (NORCO/VICODIN) 5-325 MG tablet Take 2 tablets by mouth every 4 (four) hours as needed for moderate pain. 02/09/15   [name], MD  ketorolac (ACULAR) 0.4 % SOLN Place 1 drop into both eyes 4 (four) times daily. 06/24/14   Historical Provider, MD  omeprazole (PRILOSEC) 40 MG capsule Take 40 mg by mouth at bedtime.     Historical Provider, MD  ondansetron (ZOFRAN) 4 MG tablet Take 1 tablet (4 mg total) by mouth every 8 (eight) hours as needed for nausea or vomiting. 04/05/15   Samuel Jester, DO  polyethylene glycol (MIRALAX / GLYCOLAX) packet Take 17 g by mouth daily. Patient taking differently: Take 17 g by mouth daily as needed for mild constipation.  05/03/14   Erick Blinks, MD  prednisoLONE acetate (PRED FORTE) 1 % ophthalmic suspension Apply 1 drop to eye 2 (two) times daily. 01/24/15   Historical Provider, MD  predniSONE (  DELTASONE) 5 MG tablet Take 5 mg by mouth daily. 02/22/15   Historical Provider, MD  promethazine (PHENERGAN) 25 MG tablet Take 1 tablet (25 mg total) by mouth every 6 (six) hours as needed for nausea or vomiting. Patient not taking: Reported on 04/05/2015 02/09/15   [name], MD  zolpidem (AMBIEN) 10 MG tablet Take 10 mg by mouth at bedtime.    Historical Provider, MD   Family History Family History  Problem Relation Age of Onset  . Hypertension Mother   . Breast cancer Mother   . Melanoma Mother   . Stroke Mother   . Lung cancer Father 19  . Colon cancer Neg Hx   . Colitis Neg Hx   . Cirrhosis Neg Hx   . Liver disease Neg Hx   . Pancreatic cancer Neg Hx   . Pancreatitis Neg Hx   . Anesthesia problems Neg Hx   . Hypotension Neg Hx    . Malignant hyperthermia Neg Hx   . Pseudochol deficiency Neg Hx    Social History Social History  Substance Use Topics  . Smoking status: Current Every Day Smoker    Packs/day: 3.00    Years: 20.00    Types: Cigarettes  . Smokeless tobacco: Never Used  . Alcohol use Yes   Allergies   Patient has no known allergies.  Review of Systems Review of Systems  Constitutional: Negative for appetite change and fatigue.  HENT: Negative for congestion, ear discharge and sinus pressure.   Eyes: Negative for discharge.  Respiratory: Negative for cough.   Cardiovascular: Negative for chest pain.  Gastrointestinal: Positive for abdominal pain, nausea and vomiting. Negative for diarrhea.  Genitourinary: Negative for frequency and hematuria.  Musculoskeletal: Negative for back pain.  Skin: Negative for rash.  Neurological: Negative for seizures and headaches.  Psychiatric/Behavioral: Negative for hallucinations.  All other systems reviewed and are negative.  Physical Exam Updated Vital Signs BP 136/87 (BP Location: Left Arm)   Pulse 72   Temp 98.9 F (37.2 C)   Resp 18   Ht 5\' 7"  (1.702 m)   Wt 125 lb (56.7 kg)   SpO2 99%   BMI 19.58 kg/m   Physical Exam  Constitutional: He is oriented to person, place, and time. He appears well-developed.  HENT:  Head: Normocephalic.  Eyes: Conjunctivae and EOM are normal. No scleral icterus.  Neck: Neck supple. No thyromegaly present.  Cardiovascular: Normal rate and regular rhythm.  Exam reveals no gallop and no friction rub.   No murmur heard. Pulmonary/Chest: No stridor. He has no wheezes. He has no rales. He exhibits no tenderness.  Abdominal: Soft. He exhibits no distension. There is tenderness. There is no rebound.  Moderate epigastric tenderness.   Musculoskeletal: Normal range of motion. He exhibits no edema.  Lymphadenopathy:    He has no cervical adenopathy.  Neurological: He is oriented to person, place, and time. He exhibits  normal muscle tone. Coordination normal.  Skin: No rash noted. No erythema.  Psychiatric: He has a normal mood and affect. His behavior is normal.   ED Treatments / Results  DIAGNOSTIC STUDIES: Oxygen Saturation is 99% on RA, normal by my interpretation.   COORDINATION OF CARE: 9:11 PM-Discussed next steps with pt. Pt verbalized understanding and is agreeable with the plan.   Labs (all labs ordered are listed, but only abnormal results are displayed) Labs Reviewed  LIPASE, BLOOD  COMPREHENSIVE METABOLIC PANEL  URINALYSIS, ROUTINE W REFLEX MICROSCOPIC  CBC WITH DIFFERENTIAL/PLATELET  EKG  EKG Interpretation None      Radiology No results found.  Procedures Procedures  Medications Ordered in ED Medications - No data to display  Initial Impression / Assessment and Plan / ED Course  I have reviewed the triage vital signs and the nursing notes.  Pertinent labs & imaging results that were available during my care of the patient were reviewed by me and considered in my medical decision making (see chart for details).  Clinical Course    Patient with moderate pancreatitis he will be admitted for pain control nausea medicine and IV fluids  Final Clinical Impressions(s) / ED Diagnoses   Final diagnoses:  None   New Prescriptions New Prescriptions   No medications on file   The chart was scribed for me under my direct supervision.  I personally performed the history, physical, and medical decision making and all procedures in the evaluation of this patient.Bethann Berkshire, MD 02/18/16 2244

## 2016-02-19 ENCOUNTER — Encounter (HOSPITAL_COMMUNITY): Payer: Self-pay | Admitting: *Deleted

## 2016-02-19 ENCOUNTER — Observation Stay (HOSPITAL_COMMUNITY): Payer: Medicare Other

## 2016-02-19 DIAGNOSIS — F419 Anxiety disorder, unspecified: Secondary | ICD-10-CM | POA: Diagnosis present

## 2016-02-19 DIAGNOSIS — K8521 Alcohol induced acute pancreatitis with uninfected necrosis: Secondary | ICD-10-CM | POA: Diagnosis not present

## 2016-02-19 DIAGNOSIS — Z7952 Long term (current) use of systemic steroids: Secondary | ICD-10-CM | POA: Diagnosis not present

## 2016-02-19 DIAGNOSIS — F1010 Alcohol abuse, uncomplicated: Secondary | ICD-10-CM | POA: Diagnosis not present

## 2016-02-19 DIAGNOSIS — M797 Fibromyalgia: Secondary | ICD-10-CM | POA: Diagnosis present

## 2016-02-19 DIAGNOSIS — G8929 Other chronic pain: Secondary | ICD-10-CM | POA: Diagnosis present

## 2016-02-19 DIAGNOSIS — R1013 Epigastric pain: Secondary | ICD-10-CM | POA: Diagnosis present

## 2016-02-19 DIAGNOSIS — K219 Gastro-esophageal reflux disease without esophagitis: Secondary | ICD-10-CM | POA: Diagnosis present

## 2016-02-19 DIAGNOSIS — Z79899 Other long term (current) drug therapy: Secondary | ICD-10-CM | POA: Diagnosis not present

## 2016-02-19 DIAGNOSIS — G35 Multiple sclerosis: Secondary | ICD-10-CM | POA: Diagnosis present

## 2016-02-19 DIAGNOSIS — K852 Alcohol induced acute pancreatitis without necrosis or infection: Secondary | ICD-10-CM | POA: Diagnosis not present

## 2016-02-19 LAB — CBC
HEMATOCRIT: 43.6 % (ref 39.0–52.0)
HEMOGLOBIN: 14.5 g/dL (ref 13.0–17.0)
MCH: 32.4 pg (ref 26.0–34.0)
MCHC: 33.3 g/dL (ref 30.0–36.0)
MCV: 97.3 fL (ref 78.0–100.0)
Platelets: 309 10*3/uL (ref 150–400)
RBC: 4.48 MIL/uL (ref 4.22–5.81)
RDW: 13.8 % (ref 11.5–15.5)
WBC: 10.9 10*3/uL — ABNORMAL HIGH (ref 4.0–10.5)

## 2016-02-19 LAB — COMPREHENSIVE METABOLIC PANEL
ALBUMIN: 3.2 g/dL — AB (ref 3.5–5.0)
ALK PHOS: 80 U/L (ref 38–126)
ALT: 27 U/L (ref 17–63)
ANION GAP: 6 (ref 5–15)
AST: 18 U/L (ref 15–41)
BUN: 6 mg/dL (ref 6–20)
CALCIUM: 8.3 mg/dL — AB (ref 8.9–10.3)
CHLORIDE: 103 mmol/L (ref 101–111)
CO2: 26 mmol/L (ref 22–32)
Creatinine, Ser: 0.65 mg/dL (ref 0.61–1.24)
GFR calc Af Amer: >60 mL/min (ref >60)
GFR calc non Af Amer: >60 mL/min (ref >60)
GLUCOSE: 96 mg/dL (ref 65–99)
POTASSIUM: 3.5 mmol/L (ref 3.5–5.1)
SODIUM: 135 mmol/L (ref 135–145)
Total Bilirubin: 0.9 mg/dL (ref 0.3–1.2)
Total Protein: 5.5 g/dL — ABNORMAL LOW (ref 6.5–8.1)

## 2016-02-19 LAB — URINALYSIS, ROUTINE W REFLEX MICROSCOPIC
Bilirubin Urine: NEGATIVE
Glucose, UA: NEGATIVE mg/dL
Hgb urine dipstick: NEGATIVE
KETONES UR: 20 mg/dL — AB
LEUKOCYTES UA: NEGATIVE
NITRITE: NEGATIVE
PROTEIN: NEGATIVE mg/dL
Specific Gravity, Urine: 1.01 (ref 1.005–1.030)
pH: 7 (ref 5.0–8.0)

## 2016-02-19 LAB — TSH: TSH: 0.77 u[IU]/mL (ref 0.350–4.500)

## 2016-02-19 MED ORDER — SODIUM CHLORIDE 0.9% FLUSH
3.0000 mL | Freq: Two times a day (BID) | INTRAVENOUS | Status: DC
  Administered 2016-02-19 – 2016-02-20 (×4): 3 mL via INTRAVENOUS

## 2016-02-19 MED ORDER — THIAMINE HCL 100 MG/ML IJ SOLN: 100.0000 mg | Freq: Every day | INTRAMUSCULAR | Status: DC

## 2016-02-19 MED ORDER — ADULT MULTIVITAMIN W/MINERALS CH
1.0000 | ORAL_TABLET | Freq: Every day | ORAL | Status: DC
  Administered 2016-02-19 – 2016-02-21 (×3): 1 via ORAL
  Filled 2016-02-19 (×3): qty 1

## 2016-02-19 MED ORDER — LORAZEPAM 1 MG PO TABS: 1.0000 mg | ORAL_TABLET | Freq: Four times a day (QID) | ORAL | Status: DC | PRN

## 2016-02-19 MED ORDER — HYDROMORPHONE HCL 1 MG/ML IJ SOLN
1.0000 mg | INTRAMUSCULAR | Status: DC | PRN
  Administered 2016-02-19 – 2016-02-21 (×11): 1 mg via INTRAVENOUS
  Filled 2016-02-19 (×12): qty 1

## 2016-02-19 MED ORDER — THIAMINE HCL 100 MG/ML IJ SOLN
INTRAMUSCULAR | Status: AC
  Filled 2016-02-19: qty 2

## 2016-02-19 MED ORDER — THIAMINE HCL 100 MG/ML IJ SOLN
Freq: Once | INTRAVENOUS | Status: AC
  Administered 2016-02-19: 02:00:00 via INTRAVENOUS
  Filled 2016-02-19: qty 1000

## 2016-02-19 MED ORDER — LORAZEPAM 2 MG/ML IJ SOLN
1.0000 mg | Freq: Four times a day (QID) | INTRAMUSCULAR | Status: DC | PRN
  Filled 2016-02-19 (×2): qty 1

## 2016-02-19 MED ORDER — VITAMIN B-1 100 MG PO TABS
100.0000 mg | ORAL_TABLET | Freq: Every day | ORAL | Status: DC
  Administered 2016-02-19 – 2016-02-21 (×3): 100 mg via ORAL
  Filled 2016-02-19 (×3): qty 1

## 2016-02-19 MED ORDER — ONDANSETRON HCL 4 MG/2ML IJ SOLN
4.0000 mg | Freq: Four times a day (QID) | INTRAMUSCULAR | Status: DC | PRN
  Administered 2016-02-19 – 2016-02-21 (×5): 4 mg via INTRAVENOUS
  Filled 2016-02-19 (×6): qty 2

## 2016-02-19 MED ORDER — DEXTROSE-NACL 5-0.9 % IV SOLN
INTRAVENOUS | Status: DC
  Administered 2016-02-19 – 2016-02-21 (×7): via INTRAVENOUS

## 2016-02-19 MED ORDER — FOLIC ACID 5 MG/ML IJ SOLN
INTRAMUSCULAR | Status: AC
  Filled 2016-02-19: qty 0.2

## 2016-02-19 MED ORDER — ONDANSETRON HCL 4 MG PO TABS: 4.0000 mg | ORAL_TABLET | Freq: Four times a day (QID) | ORAL | Status: DC | PRN

## 2016-02-19 MED ORDER — ENOXAPARIN SODIUM 40 MG/0.4ML ~~LOC~~ SOLN
40.0000 mg | SUBCUTANEOUS | Status: DC
  Filled 2016-02-19: qty 0.4

## 2016-02-19 MED ORDER — LORAZEPAM 2 MG/ML IJ SOLN
1.0000 mg | INTRAMUSCULAR | Status: DC | PRN
  Administered 2016-02-19 – 2016-02-21 (×10): 1 mg via INTRAVENOUS
  Filled 2016-02-19 (×10): qty 1

## 2016-02-19 MED ORDER — PANTOPRAZOLE SODIUM 40 MG IV SOLR
40.0000 mg | Freq: Two times a day (BID) | INTRAVENOUS | Status: DC
  Administered 2016-02-19 – 2016-02-21 (×5): 40 mg via INTRAVENOUS
  Filled 2016-02-19 (×5): qty 40

## 2016-02-19 MED ORDER — FOLIC ACID 1 MG PO TABS
1.0000 mg | ORAL_TABLET | Freq: Every day | ORAL | Status: DC
  Administered 2016-02-19 – 2016-02-21 (×3): 1 mg via ORAL
  Filled 2016-02-19 (×3): qty 1

## 2016-02-19 MED ORDER — M.V.I. ADULT IV INJ
INJECTION | INTRAVENOUS | Status: AC
  Filled 2016-02-19: qty 10

## 2016-02-19 NOTE — Care Management Obs Status (Signed)
MEDICARE OBSERVATION STATUS NOTIFICATION   Patient Details  Name: MICCAH COCKERELL MRN: [id number] Date of Birth: [birth year]   Medicare Observation Status Notification Given:  Yes    Malcolm Metro, RN 02/19/2016, 2:10 PM

## 2016-02-19 NOTE — Plan of Care (Signed)
Problem: Tissue Perfusion: Goal: Risk factors for ineffective tissue perfusion will decrease Outcome: Not Progressing Pt refuses Lovenox at this time.

## 2016-02-19 NOTE — Care Management Note (Signed)
Case Management Note  Patient Details  Name: Brent [name] MRN: [id number] Date of Birth: July 28, 1970  Subjective/Objective:                  Pt is from home, admitted with alcoholic pancreatitis. He lives with his mother and is ind with ADL's. He has PCP, transportation and insurance with drug coverage. He plans to return home with self care at DC.   Action/Plan: No CM needs anticipated.   Expected Discharge Date:    02/20/2015              Expected Discharge Plan:  Home/Self Care  In-House Referral:  NA  Discharge planning Services  CM Consult  Post Acute Care Choice:  NA Choice offered to:  NA  Status of Service:  Completed, signed off    Malcolm Metro, RN 02/19/2016, 2:11 PM

## 2016-02-19 NOTE — Progress Notes (Signed)
PROGRESS NOTE    EAGLE KILDUFF  HKV:[id number] DOB: 02-Jan-1971 DOA: 02/18/2016 PCP: Milana Obey, MD   Brief Narrative: 46 year old male with history of prior alcohol pancreatitis, UV 80s, anxiety, tobacco abuse, presented with nausea vomiting, abdominal pain and unable to tolerate oral diet. Patient has been drinking alcohol for several days especially since Christmas and new year. He reported drinking about 20 bottles of beer every day. Patient with elevated lipase level.  Assessment & Plan:   Principal Problem:   Acute alcoholic pancreatitis Active Problems:   ETOH abuse   Chronic back pain   Anxiety   Tobacco abuse   Pancreatitis, alcoholic, acute  #Acute alcoholic pancreatitis: -Patient was alert awake and is still complaining of abdominal pain. We will start clear liquid and advance as tolerated. Continue IV fluid, vitamin, pain medications. On Protonix. -I will obtain right upper quadrant ultrasound. -Continue to monitor clinically.  #Alcohol abuse: Patient denied withdrawal from alcohol in the past. He reported that he does not drink chronically but has been drinking since Christmas and more from new year. -We will observe with CIWA protocol for possible withdrawal. Continue folic acid, thiamine, IV fluid and Ativan as needed. -I'll consult social worker for outpatient resources for detox program. Education provided to the patient to cut down alcohol use.  #Nausea vomiting and epigastric pain: Treatment as above. DVT prophylaxis: Lovenox subcutaneous Code Status: Full code Family Communication: No family present at bedside Disposition Plan: Likely discharge home in 1-2 days.  Consultants:   None  Procedures: None Antimicrobials: None  Subjective: Patient was seen and examined at bedside. Patient reported epigastric pain initiated nausea. No abdominal pain today. No diarrhea constipation. No headache, chest pain or shortness of breath.   Objective: Vitals:     02/18/16 2313 02/18/16 2344 02/19/16 0019 02/19/16 0618  BP:  105/82 134/85 134/88  Pulse:  82 80 82  Resp: 18 18 18 18   Temp:   99.3 F (37.4 C) 99.2 F (37.3 C)  TempSrc:   Oral Oral  SpO2:  91% 95% 92%  Weight:   55.5 kg (122 lb 5.7 oz)   Height:        Intake/Output Summary (Last 24 hours) at 02/19/16 1601 Last data filed at 02/19/16 1021  Gross per 24 hour  Intake             1000 ml  Output              100 ml  Net              900 ml   Filed Weights   02/18/16 2059 02/18/16 2100 02/19/16 0019  Weight: 56.7 kg (125 lb) 56.7 kg (125 lb) 55.5 kg (122 lb 5.7 oz)    Examination:  General exam: Appears calm and comfortable  Respiratory system: Clear to auscultation. Respiratory effort normal. No wheezing or crackle Cardiovascular system: S1 & S2 heard, RRR.  No pedal edema. Gastrointestinal system: Abdomen is nondistended, soft and Epigastric tenderness. Normal bowel sounds heard. Central nervous system: Alert and oriented. No focal neurological deficits. No upper extremity tremors noticed today. Extremities: Symmetric 5 x 5 power. Skin: No rashes, lesions or ulcers Psychiatry: Judgement and insight appear normal. Mood & affect appropriate.     Data Reviewed: I have personally reviewed following labs and imaging studies  CBC:  Recent Labs Lab 02/18/16 2102 02/19/16 0537  WBC 12.6* 10.9*  NEUTROABS 10.2*  --   HGB 15.3 14.5  HCT 45.6  43.6  MCV 96.8 97.3  PLT 274 309   Basic Metabolic Panel:  Recent Labs Lab 02/18/16 2102 02/19/16 0537  NA 137 135  K 3.5 3.5  CL 103 103  CO2 27 26  GLUCOSE 104* 96  BUN 7 6  CREATININE 0.67 0.65  CALCIUM 8.7* 8.3*   GFR: Estimated Creatinine Clearance: 89.6 mL/min (by C-G formula based on SCr of 0.65 mg/dL). Liver Function Tests:  Recent Labs Lab 02/18/16 2102 02/19/16 0537  AST 22 18  ALT 33 27  ALKPHOS 92 80  BILITOT 0.7 0.9  PROT 6.1* 5.5*  ALBUMIN 3.6 3.2*    Recent Labs Lab 02/18/16 2102   LIPASE 141*   No results for input(s): AMMONIA in the last 168 hours. Coagulation Profile: No results for input(s): INR, PROTIME in the last 168 hours. Cardiac Enzymes: No results for input(s): CKTOTAL, CKMB, CKMBINDEX, TROPONINI in the last 168 hours. BNP (last 3 results) No results for input(s): PROBNP in the last 8760 hours. HbA1C: No results for input(s): HGBA1C in the last 72 hours. CBG: No results for input(s): GLUCAP in the last 168 hours. Lipid Profile: No results for input(s): CHOL, HDL, LDLCALC, TRIG, CHOLHDL, LDLDIRECT in the last 72 hours. Thyroid Function Tests:  Recent Labs  02/19/16 0537  TSH 0.770   Anemia Panel: No results for input(s): VITAMINB12, FOLATE, FERRITIN, TIBC, IRON, RETICCTPCT in the last 72 hours. Sepsis Labs: No results for input(s): PROCALCITON, LATICACIDVEN in the last 168 hours.  No results found for this or any previous visit (from the past 240 hour(s)).       Radiology Studies: Dg Abd Acute W/chest  Result Date: 02/18/2016 CLINICAL DATA:  Left upper abdominal pain. EXAM: DG ABDOMEN ACUTE W/ 1V CHEST COMPARISON:  Chest x-ray April 05, 2015 FINDINGS: Scarring is seen in the left apex, unchanged. No pneumothorax. The lungs are otherwise clear. The cardiomediastinal silhouette is normal. No free air, portal venous gas, or pneumatosis. The bowel gas pattern is nonobstructive. IMPRESSION: Negative abdominal radiographs.  No acute cardiopulmonary disease. Electronically Signed   By: Gerome Sam III M.D   On: 02/18/2016 21:52        Scheduled Meds: . enoxaparin (LOVENOX) injection  40 mg Subcutaneous Q24H  . folic acid  1 mg Oral Daily  . multivitamin with minerals  1 tablet Oral Daily  . pantoprazole (PROTONIX) IV  40 mg Intravenous Q12H  . sodium chloride flush  3 mL Intravenous Q12H  . thiamine  100 mg Oral Daily   Or  . thiamine  100 mg Intravenous Daily   Continuous Infusions: . dextrose 5 % and 0.9% NaCl 125 mL/hr at  02/19/16 0812     LOS: 0 days    Dron Jaynie Collins, MD [facility] Pager [phone]  If 7PM-7AM, please contact night-coverage [email or web address] Password [email or web address] 02/19/2016, 4:01 PM

## 2016-02-20 NOTE — Progress Notes (Signed)
PROGRESS NOTE    Brent [name]  QMV:[id number] DOB: 05/28/1970 DOA: 02/18/2016 PCP: Milana Obey, MD   Brief Narrative: 46 year old male with history of prior alcohol pancreatitis, UV 80s, anxiety, tobacco abuse, presented with nausea vomiting, abdominal pain and unable to tolerate oral diet. Patient has been drinking alcohol for several days especially since Christmas and new year. He reported drinking about 20 bottles of beer every day. Patient with elevated lipase level.  Assessment & Plan:   Principal Problem:   Acute alcoholic pancreatitis Active Problems:   ETOH abuse   Chronic back pain   Anxiety   Tobacco abuse   Pancreatitis, alcoholic, acute  #Acute alcoholic pancreatitis: -Patient is still complaining of abdominal pain. Vomited this morning. Currently on clear liquid, we will advance to full liquid today. Continue IV fluid and supportive care. Pain medications as needed.  -on Protonix. -Right upper quadrant ultrasound result reviewed. No acute finding to suggest inpatient workup. Patient needed to cut back on alcohol use. Education provided to the patient. Once he is able to eat good he can be discharged home with outpatient follow-up.  #Alcohol abuse: Patient denied withdrawal from alcohol in the past. He reported that he does not drink chronically but has been drinking since Christmas and more from new year. -Continue to observe with CIWA protocol for possible withdrawal. Continue folic acid, thiamine, IV fluid and Ativan as needed. -Social worker consulted for outpatient resources for detox program. Education provided to the patient to cut down alcohol use.  #Nausea vomiting and epigastric pain: Treatment as above.  DVT prophylaxis: Lovenox subcutaneous Code Status: Full code Family Communication: No family present at bedside Disposition Plan: Likely discharge home in 1-2 days. When able to tolerate diet.  Consultants:   None  Procedures:  None Antimicrobials: None  Subjective: Patient was seen and examined at bedside. Patient reported still having abdominal pain especially while eating. Had one episode of vomiting today. Having nausea. Denied chest pain, shortness of breath, fever or chills.   Objective: Vitals:   02/19/16 2304 02/20/16 0629 02/20/16 1039 02/20/16 1325  BP: 117/79 102/71  97/71  Pulse: 100 (!) 102  96  Resp: 20 20  20   Temp: 99.5 F (37.5 C) 99.5 F (37.5 C)  98.6 F (37 C)  TempSrc: Oral Oral  Oral  SpO2: 97% 94% 94% 95%  Weight:      Height:        Intake/Output Summary (Last 24 hours) at 02/20/16 1556 Last data filed at 02/20/16 1300  Gross per 24 hour  Intake          3244.59 ml  Output              900 ml  Net          2344.59 ml   Filed Weights   02/18/16 2059 02/18/16 2100 02/19/16 0019  Weight: 56.7 kg (125 lb) 56.7 kg (125 lb) 55.5 kg (122 lb 5.7 oz)    Examination:  General exam: Alert awake, not in distress Respiratory system: Clear to auscultation. Respiratory effort normal. No wheezing or crackle Cardiovascular system: S1 & S2 heard, RRR.  No pedal edema. Gastrointestinal system: Abdomen soft, epigastric tenderness, not distended. Bowel sound positive. Central nervous system: Alert and oriented. No focal neurological deficits. No upper extremities tremor to suggest withdrawal today. Extremities: Symmetric 5 x 5 power. Skin: No rashes, lesions or ulcers Psychiatry: Alert, oriented,     Data Reviewed: I have personally reviewed following labs  and imaging studies  CBC:  Recent Labs Lab 02/18/16 2102 02/19/16 0537  WBC 12.6* 10.9*  NEUTROABS 10.2*  --   HGB 15.3 14.5  HCT 45.6 43.6  MCV 96.8 97.3  PLT 274 309   Basic Metabolic Panel:  Recent Labs Lab 02/18/16 2102 02/19/16 0537  NA 137 135  K 3.5 3.5  CL 103 103  CO2 27 26  GLUCOSE 104* 96  BUN 7 6  CREATININE 0.67 0.65  CALCIUM 8.7* 8.3*   GFR: Estimated Creatinine Clearance: 89.6 mL/min (by C-G  formula based on SCr of 0.65 mg/dL). Liver Function Tests:  Recent Labs Lab 02/18/16 2102 02/19/16 0537  AST 22 18  ALT 33 27  ALKPHOS 92 80  BILITOT 0.7 0.9  PROT 6.1* 5.5*  ALBUMIN 3.6 3.2*    Recent Labs Lab 02/18/16 2102  LIPASE 141*   No results for input(s): AMMONIA in the last 168 hours. Coagulation Profile: No results for input(s): INR, PROTIME in the last 168 hours. Cardiac Enzymes: No results for input(s): CKTOTAL, CKMB, CKMBINDEX, TROPONINI in the last 168 hours. BNP (last 3 results) No results for input(s): PROBNP in the last 8760 hours. HbA1C: No results for input(s): HGBA1C in the last 72 hours. CBG: No results for input(s): GLUCAP in the last 168 hours. Lipid Profile: No results for input(s): CHOL, HDL, LDLCALC, TRIG, CHOLHDL, LDLDIRECT in the last 72 hours. Thyroid Function Tests:  Recent Labs  02/19/16 0537  TSH 0.770   Anemia Panel: No results for input(s): VITAMINB12, FOLATE, FERRITIN, TIBC, IRON, RETICCTPCT in the last 72 hours. Sepsis Labs: No results for input(s): PROCALCITON, LATICACIDVEN in the last 168 hours.  No results found for this or any previous visit (from the past 240 hour(s)).       Radiology Studies: Dg Abd Acute W/chest  Result Date: 02/18/2016 CLINICAL DATA:  Left upper abdominal pain. EXAM: DG ABDOMEN ACUTE W/ 1V CHEST COMPARISON:  Chest x-ray April 05, 2015 FINDINGS: Scarring is seen in the left apex, unchanged. No pneumothorax. The lungs are otherwise clear. The cardiomediastinal silhouette is normal. No free air, portal venous gas, or pneumatosis. The bowel gas pattern is nonobstructive. IMPRESSION: Negative abdominal radiographs.  No acute cardiopulmonary disease. Electronically Signed   By: Gerome Sam III M.D   On: 02/18/2016 21:52   US Abdomen Limited Ruq  Result Date: 02/19/2016 CLINICAL DATA:  Pancreatitis. EXAM: US ABDOMEN LIMITED - RIGHT UPPER QUADRANT COMPARISON:  02/18/2016 radiographs FINDINGS:  Gallbladder: Small amount of pericholecystic fluid along the gallbladder fossa. No gallbladder wall thickening. Sonographic Murphy's sign absent. No gallstones seen. Common bile duct: Diameter: CBD 5 mm in diameter, without well-defined choledocholithiasis. Liver: No focal lesion identified.  Mild intrahepatic biliary dilatation. Other: Several images include parts of the right kidney with a suggestion that right renal echogenicity is higher than echogenicity in the liver, for example image 37. IMPRESSION: 1. Mild intrahepatic biliary dilatation with upper normal sized common bile duct, but without directly visualized choledocholithiasis. 2. Small amount of ascites adjacent to the gallbladder. 3. The right kidney is slightly echogenic compared to the liver. This is nonspecific but usually considered abnormal, and can be seen in the setting of diabetic nephropathy or chronic medical renal disease. Electronically Signed   By: Gaylyn Rong M.D.   On: 02/19/2016 17:17        Scheduled Meds: . enoxaparin (LOVENOX) injection  40 mg Subcutaneous Q24H  . folic acid  1 mg Oral Daily  . multivitamin with minerals  1 tablet Oral Daily  . pantoprazole (PROTONIX) IV  40 mg Intravenous Q12H  . sodium chloride flush  3 mL Intravenous Q12H  . thiamine  100 mg Oral Daily   Or  . thiamine  100 mg Intravenous Daily   Continuous Infusions: . dextrose 5 % and 0.9% NaCl 125 mL/hr at 02/20/16 1222     LOS: 1 day    Brandilee Pies Jaynie Collins, MD [facility] Pager [phone]  If 7PM-7AM, please contact night-coverage [email or web address] Password [email or web address] 02/20/2016, 3:56 PM

## 2016-02-21 DIAGNOSIS — K852 Alcohol induced acute pancreatitis without necrosis or infection: Principal | ICD-10-CM

## 2016-02-21 LAB — COMPREHENSIVE METABOLIC PANEL
ALT: 24 U/L (ref 17–63)
ANION GAP: 5 (ref 5–15)
AST: 24 U/L (ref 15–41)
Albumin: 3.2 g/dL — ABNORMAL LOW (ref 3.5–5.0)
Alkaline Phosphatase: 84 U/L (ref 38–126)
BILIRUBIN TOTAL: 0.8 mg/dL (ref 0.3–1.2)
BUN: 5 mg/dL — AB (ref 6–20)
CO2: 23 mmol/L (ref 22–32)
Calcium: 8.4 mg/dL — ABNORMAL LOW (ref 8.9–10.3)
Chloride: 107 mmol/L (ref 101–111)
Creatinine, Ser: 0.62 mg/dL (ref 0.61–1.24)
GFR calc Af Amer: >60 mL/min (ref >60)
Glucose, Bld: 97 mg/dL (ref 65–99)
Potassium: 3.4 mmol/L — ABNORMAL LOW (ref 3.5–5.1)
Sodium: 135 mmol/L (ref 135–145)
TOTAL PROTEIN: 5.7 g/dL — AB (ref 6.5–8.1)

## 2016-02-21 NOTE — Discharge Summary (Signed)
AMA  Patient at this time expresses desire to leave the Hospital immidiately, patient has been warned that this is not Medically advisable at this time, and can result in Medical complications like Death and Disability, patient understands and accepts the risks involved and assumes full responsibilty of this decision.   Leroy Sea M.D on 02/21/2016 at 4:07 PM  [facility]  Time < 30 mins  Last Note Below   PROGRESS NOTE    Brent [name]  ZOX:[id number] DOB: [birth year] DOA: 02/18/2016 PCP: Milana Obey, MD   Brief Narrative: 46 year old male with history of prior alcohol pancreatitis, UV 80s, anxiety, tobacco abuse, presented with nausea vomiting, abdominal pain and unable to tolerate oral diet. Patient has been drinking alcohol for several days especially since Christmas and new year. He reported drinking about 20 bottles of beer every day. Patient with elevated lipase level.  Assessment & Plan:    #Acute alcoholic pancreatitis: -Eating better with conservative treatment tolerating liquid diet which will be continued as he still has some pain,. Continue IV fluid and supportive care. Pain medications as needed.  -on Protonix. -Right upper quadrant ultrasound result reviewed. No acute finding to suggest inpatient workup. Patient needed to cut back on alcohol use. Education provided to the patient. Once he is able to eat good he can be discharged home with outpatient follow-up.  #Alcohol abuse: Patient denied withdrawal from alcohol in the past. He reported that he does not drink chronically but has been drinking since Christmas and more from new year. Also to quit alcohol. -Continue to observe with CIWA protocol for possible withdrawal. Continue folic acid, thiamine, IV fluid and Ativan as needed. -Social worker consulted for  outpatient resources for detox program. Education provided to the patient to cut down alcohol use.  #Nausea vomiting and epigastric pain: Treatment as above.    DVT prophylaxis: Lovenox subcutaneous Code Status: Full code Family Communication: No family present at bedside Disposition Plan: Likely discharge home in 1-2 days. When able to tolerate diet.  Consultants:   None  Procedures: None Antimicrobials: None  Subjective: Patient in bed, abdominal pain is better and denies any fever or chills, no chest pain or shortness of breath.   Objective: Vitals:   02/20/16 1039 02/20/16 1325 02/20/16 2159 02/21/16 0417  BP:  97/71 99/72 103/72  Pulse:  96 96 82  Resp:  20 20 18   Temp:  98.6 F (37 C) 98.8 F (37.1 C) 99.5 F (37.5 C)  TempSrc:  Oral Oral   SpO2: 94% 95% 96% 94%  Weight:      Height:        Intake/Output Summary (Last 24 hours) at 02/21/16 1607 Last data filed at 02/21/16 0639  Gross per 24 hour  Intake          3521.67 ml  Output              400 ml  Net          3121.67 ml   Filed Weights   02/18/16 2059 02/18/16 2100 02/19/16 0019  Weight: 56.7 kg (125 lb) 56.7 kg (125 lb) 55.5 kg (122 lb 5.7 oz)  Examination:  General exam: Alert awake, not in distress Respiratory system: Clear to auscultation. Respiratory effort normal. No wheezing or crackle Cardiovascular system: S1 & S2 heard, RRR.  No pedal edema. Gastrointestinal system: Abdomen soft, epigastric tenderness, not distended. Bowel sound positive. Central nervous system: Alert and oriented. No focal neurological deficits. No upper extremities tremor to suggest withdrawal today. Extremities: Symmetric 5 x 5 power. Skin: No rashes, lesions or ulcers Psychiatry: Alert, oriented,     Data Reviewed: I have personally reviewed following labs and imaging studies  CBC:  Recent Labs Lab 02/18/16 2102 02/19/16 0537  WBC 12.6* 10.9*  NEUTROABS 10.2*  --   HGB 15.3 14.5  HCT 45.6 43.6  MCV  96.8 97.3  PLT 274 309   Basic Metabolic Panel:  Recent Labs Lab 02/18/16 2102 02/19/16 0537 02/21/16 1243  NA 137 135 135  K 3.5 3.5 3.4*  CL 103 103 107  CO2 27 26 23   GLUCOSE 104* 96 97  BUN 7 6 5*  CREATININE 0.67 0.65 0.62  CALCIUM 8.7* 8.3* 8.4*   GFR: Estimated Creatinine Clearance: 89.6 mL/min (by C-G formula based on SCr of 0.62 mg/dL). Liver Function Tests:  Recent Labs Lab 02/18/16 2102 02/19/16 0537 02/21/16 1243  AST 22 18 24   ALT 33 27 24  ALKPHOS 92 80 84  BILITOT 0.7 0.9 0.8  PROT 6.1* 5.5* 5.7*  ALBUMIN 3.6 3.2* 3.2*    Recent Labs Lab 02/18/16 2102  LIPASE 141*   No results for input(s): AMMONIA in the last 168 hours. Coagulation Profile: No results for input(s): INR, PROTIME in the last 168 hours. Cardiac Enzymes: No results for input(s): CKTOTAL, CKMB, CKMBINDEX, TROPONINI in the last 168 hours. BNP (last 3 results) No results for input(s): PROBNP in the last 8760 hours. HbA1C: No results for input(s): HGBA1C in the last 72 hours. CBG: No results for input(s): GLUCAP in the last 168 hours. Lipid Profile: No results for input(s): CHOL, HDL, LDLCALC, TRIG, CHOLHDL, LDLDIRECT in the last 72 hours. Thyroid Function Tests:  Recent Labs  02/19/16 0537  TSH 0.770   Anemia Panel: No results for input(s): VITAMINB12, FOLATE, FERRITIN, TIBC, IRON, RETICCTPCT in the last 72 hours. Sepsis Labs: No results for input(s): PROCALCITON, LATICACIDVEN in the last 168 hours.  No results found for this or any previous visit (from the past 240 hour(s)).       Radiology Studies: US Abdomen Limited Ruq  Result Date: 02/19/2016 CLINICAL DATA:  Pancreatitis. EXAM: US ABDOMEN LIMITED - RIGHT UPPER QUADRANT COMPARISON:  02/18/2016 radiographs FINDINGS: Gallbladder: Small amount of pericholecystic fluid along the gallbladder fossa. No gallbladder wall thickening. Sonographic Murphy's sign absent. No gallstones seen. Common bile duct: Diameter: CBD 5 mm  in diameter, without well-defined choledocholithiasis. Liver: No focal lesion identified.  Mild intrahepatic biliary dilatation. Other: Several images include parts of the right kidney with a suggestion that right renal echogenicity is higher than echogenicity in the liver, for example image 37. IMPRESSION: 1. Mild intrahepatic biliary dilatation with upper normal sized common bile duct, but without directly visualized choledocholithiasis. 2. Small amount of ascites adjacent to the gallbladder. 3. The right kidney is slightly echogenic compared to the liver. This is nonspecific but usually considered abnormal, and can be seen in the setting of diabetic nephropathy or chronic medical renal disease. Electronically Signed   By: Gaylyn Rong M.D.   On: 02/19/2016 17:17        Scheduled Meds: . enoxaparin (LOVENOX) injection  40 mg  Subcutaneous Q24H  . folic acid  1 mg Oral Daily  . multivitamin with minerals  1 tablet Oral Daily  . pantoprazole (PROTONIX) IV  40 mg Intravenous Q12H  . sodium chloride flush  3 mL Intravenous Q12H  . thiamine  100 mg Oral Daily   Or  . thiamine  100 mg Intravenous Daily   Continuous Infusions: . dextrose 5 % and 0.9% NaCl 75 mL/hr at 02/21/16 1317     LOS: 2 days    Leroy Sea, MD [facility] Pager [phone]  If 7PM-7AM, please contact night-coverage [email or web address] Password [email or web address] 02/21/2016, 4:07 PM

## 2016-02-21 NOTE — Progress Notes (Signed)
Pt signed out against Medical advice. Dr Thedore Mins is aware

## 2016-02-21 NOTE — Progress Notes (Signed)
PROGRESS NOTE    Brent [name]  YBW:[id number] DOB: [birth year] DOA: 02/18/2016 PCP: Milana Obey, MD   Brief Narrative: 46 year old male with history of prior alcohol pancreatitis, UV 80s, anxiety, tobacco abuse, presented with nausea vomiting, abdominal pain and unable to tolerate oral diet. Patient has been drinking alcohol for several days especially since Christmas and new year. He reported drinking about 20 bottles of beer every day. Patient with elevated lipase level.  Assessment & Plan:    #Acute alcoholic pancreatitis: -Eating better with conservative treatment tolerating liquid diet which will be continued as he still has some pain,. Continue IV fluid and supportive care. Pain medications as needed.  -on Protonix. -Right upper quadrant ultrasound result reviewed. No acute finding to suggest inpatient workup. Patient needed to cut back on alcohol use. Education provided to the patient. Once he is able to eat good he can be discharged home with outpatient follow-up.  #Alcohol abuse: Patient denied withdrawal from alcohol in the past. He reported that he does not drink chronically but has been drinking since Christmas and more from new year. Also to quit alcohol. -Continue to observe with CIWA protocol for possible withdrawal. Continue folic acid, thiamine, IV fluid and Ativan as needed. -Social worker consulted for outpatient resources for detox program. Education provided to the patient to cut down alcohol use.  #Nausea vomiting and epigastric pain: Treatment as above.    DVT prophylaxis: Lovenox subcutaneous Code Status: Full code Family Communication: No family present at bedside Disposition Plan: Likely discharge home in 1-2 days. When able to tolerate diet.  Consultants:   None  Procedures: None Antimicrobials: None  Subjective: Patient in bed, abdominal pain is better and denies any fever or chills, no chest pain or shortness of  breath.   Objective: Vitals:   02/20/16 1039 02/20/16 1325 02/20/16 2159 02/21/16 0417  BP:  97/71 99/72 103/72  Pulse:  96 96 82  Resp:  20 20 18   Temp:  98.6 F (37 C) 98.8 F (37.1 C) 99.5 F (37.5 C)  TempSrc:  Oral Oral   SpO2: 94% 95% 96% 94%  Weight:      Height:        Intake/Output Summary (Last 24 hours) at 02/21/16 1157 Last data filed at 02/21/16 0639  Gross per 24 hour  Intake          3761.67 ml  Output              400 ml  Net          3361.67 ml   Filed Weights   02/18/16 2059 02/18/16 2100 02/19/16 0019  Weight: 56.7 kg (125 lb) 56.7 kg (125 lb) 55.5 kg (122 lb 5.7 oz)    Examination:  General exam: Alert awake, not in distress Respiratory system: Clear to auscultation. Respiratory effort normal. No wheezing or crackle Cardiovascular system: S1 & S2 heard, RRR.  No pedal edema. Gastrointestinal system: Abdomen soft, epigastric tenderness, not distended. Bowel sound positive. Central nervous system: Alert and oriented. No focal neurological deficits. No upper extremities tremor to suggest withdrawal today. Extremities: Symmetric 5 x 5 power. Skin: No rashes, lesions or ulcers Psychiatry: Alert, oriented,     Data Reviewed: I have personally reviewed following labs and imaging studies  CBC:  Recent Labs Lab 02/18/16 2102 02/19/16 0537  WBC 12.6* 10.9*  NEUTROABS 10.2*  --   HGB 15.3 14.5  HCT 45.6 43.6  MCV 96.8 97.3  PLT 274 309  Basic Metabolic Panel:  Recent Labs Lab 02/18/16 2102 02/19/16 0537  NA 137 135  K 3.5 3.5  CL 103 103  CO2 27 26  GLUCOSE 104* 96  BUN 7 6  CREATININE 0.67 0.65  CALCIUM 8.7* 8.3*   GFR: Estimated Creatinine Clearance: 89.6 mL/min (by C-G formula based on SCr of 0.65 mg/dL). Liver Function Tests:  Recent Labs Lab 02/18/16 2102 02/19/16 0537  AST 22 18  ALT 33 27  ALKPHOS 92 80  BILITOT 0.7 0.9  PROT 6.1* 5.5*  ALBUMIN 3.6 3.2*    Recent Labs Lab 02/18/16 2102  LIPASE 141*   No  results for input(s): AMMONIA in the last 168 hours. Coagulation Profile: No results for input(s): INR, PROTIME in the last 168 hours. Cardiac Enzymes: No results for input(s): CKTOTAL, CKMB, CKMBINDEX, TROPONINI in the last 168 hours. BNP (last 3 results) No results for input(s): PROBNP in the last 8760 hours. HbA1C: No results for input(s): HGBA1C in the last 72 hours. CBG: No results for input(s): GLUCAP in the last 168 hours. Lipid Profile: No results for input(s): CHOL, HDL, LDLCALC, TRIG, CHOLHDL, LDLDIRECT in the last 72 hours. Thyroid Function Tests:  Recent Labs  02/19/16 0537  TSH 0.770   Anemia Panel: No results for input(s): VITAMINB12, FOLATE, FERRITIN, TIBC, IRON, RETICCTPCT in the last 72 hours. Sepsis Labs: No results for input(s): PROCALCITON, LATICACIDVEN in the last 168 hours.  No results found for this or any previous visit (from the past 240 hour(s)).       Radiology Studies: US Abdomen Limited Ruq  Result Date: 02/19/2016 CLINICAL DATA:  Pancreatitis. EXAM: US ABDOMEN LIMITED - RIGHT UPPER QUADRANT COMPARISON:  02/18/2016 radiographs FINDINGS: Gallbladder: Small amount of pericholecystic fluid along the gallbladder fossa. No gallbladder wall thickening. Sonographic Murphy's sign absent. No gallstones seen. Common bile duct: Diameter: CBD 5 mm in diameter, without well-defined choledocholithiasis. Liver: No focal lesion identified.  Mild intrahepatic biliary dilatation. Other: Several images include parts of the right kidney with a suggestion that right renal echogenicity is higher than echogenicity in the liver, for example image 37. IMPRESSION: 1. Mild intrahepatic biliary dilatation with upper normal sized common bile duct, but without directly visualized choledocholithiasis. 2. Small amount of ascites adjacent to the gallbladder. 3. The right kidney is slightly echogenic compared to the liver. This is nonspecific but usually considered abnormal, and can be  seen in the setting of diabetic nephropathy or chronic medical renal disease. Electronically Signed   By: Gaylyn Rong M.D.   On: 02/19/2016 17:17        Scheduled Meds: . enoxaparin (LOVENOX) injection  40 mg Subcutaneous Q24H  . folic acid  1 mg Oral Daily  . multivitamin with minerals  1 tablet Oral Daily  . pantoprazole (PROTONIX) IV  40 mg Intravenous Q12H  . sodium chloride flush  3 mL Intravenous Q12H  . thiamine  100 mg Oral Daily   Or  . thiamine  100 mg Intravenous Daily   Continuous Infusions: . dextrose 5 % and 0.9% NaCl 125 mL/hr at 02/21/16 0416     LOS: 2 days    Leroy Sea, MD [facility] Pager [phone]  If 7PM-7AM, please contact night-coverage [email or web address] Password [email or web address] 02/21/2016, 11:57 AM

## 2017-03-17 ENCOUNTER — Encounter (HOSPITAL_COMMUNITY): Payer: Self-pay

## 2017-03-17 ENCOUNTER — Encounter (HOSPITAL_COMMUNITY)
Admission: RE | Admit: 2017-03-17 | Discharge: 2017-03-17 | Disposition: A | Discharge status: Home / Self Care | Payer: Medicare Other | Source: Ambulatory Visit | Attending: Neurology | Admitting: Neurology

## 2017-03-17 DIAGNOSIS — G35 Multiple sclerosis: Secondary | ICD-10-CM | POA: Diagnosis present

## 2017-03-17 MED ORDER — SODIUM CHLORIDE 0.9 % IV SOLN
300.0000 mg | INTRAVENOUS | Status: DC
  Administered 2017-03-17: 300 mg via INTRAVENOUS
  Filled 2017-03-17: qty 15

## 2017-03-17 MED ORDER — LORATADINE 10 MG PO TABS
ORAL_TABLET | ORAL | Status: AC
  Filled 2017-03-17: qty 1

## 2017-03-17 MED ORDER — ACETAMINOPHEN 500 MG PO TABS
1000.0000 mg | ORAL_TABLET | Freq: Once | ORAL | Status: AC
  Administered 2017-03-17: 1000 mg via ORAL

## 2017-03-17 MED ORDER — LORATADINE 10 MG PO TABS
10.0000 mg | ORAL_TABLET | Freq: Once | ORAL | Status: AC
  Administered 2017-03-17: 10 mg via ORAL

## 2017-03-17 MED ORDER — ACETAMINOPHEN 500 MG PO TABS
ORAL_TABLET | ORAL | Status: AC
Start: 2017-03-17 — End: 2017-03-17
  Filled 2017-03-17: qty 2

## 2017-03-17 MED ORDER — SODIUM CHLORIDE 0.9 % IV SOLN
Freq: Once | INTRAVENOUS | Status: AC
  Administered 2017-03-17: 12:00:00 via INTRAVENOUS

## 2017-03-17 NOTE — Progress Notes (Signed)
Patient tolerated infusion without reaction.  Kept for 1 hour post infusion. Denies any complaints.  Medication information given to patient and mother to review.  Went over s/s of reaction and what to do if this should occur.  Return appt made for 1 month.

## 2017-03-17 NOTE — Discharge Instructions (Signed)

## 2017-04-14 ENCOUNTER — Inpatient Hospital Stay (HOSPITAL_COMMUNITY): Admission: RE | Admit: 2017-04-14 | Payer: Medicare Other | Source: Ambulatory Visit

## 2017-04-15 ENCOUNTER — Encounter (HOSPITAL_COMMUNITY): Payer: Medicare Other

## 2017-04-16 ENCOUNTER — Encounter (HOSPITAL_COMMUNITY)
Admission: RE | Admit: 2017-04-16 | Discharge: 2017-04-16 | Disposition: A | Discharge status: Home / Self Care | Payer: Medicare Other | Source: Ambulatory Visit | Attending: Neurology | Admitting: Neurology

## 2017-05-27 ENCOUNTER — Encounter (HOSPITAL_COMMUNITY)
Admission: RE | Admit: 2017-05-27 | Discharge: 2017-05-27 | Disposition: A | Discharge status: Home / Self Care | Payer: Medicare Other | Source: Ambulatory Visit | Attending: Neurology | Admitting: Neurology

## 2017-05-27 NOTE — Progress Notes (Signed)
Patient has not had infusion of Tysabri since February, cancelled March appt and did not come to appointment today.  Attempted to contact him, without success.  Message left with Triage nurse at Dr Epstein's office to alert him of non compliance.

## 2017-07-08 ENCOUNTER — Encounter: Payer: Self-pay | Admitting: Orthopedic Surgery

## 2017-08-06 ENCOUNTER — Encounter (HOSPITAL_COMMUNITY)
Admission: RE | Admit: 2017-08-06 | Discharge: 2017-08-06 | Disposition: A | Discharge status: Home / Self Care | Payer: Medicare Other | Source: Ambulatory Visit | Attending: Neurology | Admitting: Neurology

## 2017-08-06 DIAGNOSIS — G35 Multiple sclerosis: Secondary | ICD-10-CM | POA: Insufficient documentation

## 2017-08-06 MED ORDER — LORATADINE 10 MG PO TABS
10.0000 mg | ORAL_TABLET | Freq: Once | ORAL | Status: AC
  Administered 2017-08-06: 10 mg via ORAL
  Filled 2017-08-06: qty 1

## 2017-08-06 MED ORDER — NATALIZUMAB 300 MG/15ML IV CONC
300.0000 mg | Freq: Once | INTRAVENOUS | Status: AC
  Administered 2017-08-06: 300 mg via INTRAVENOUS
  Filled 2017-08-06: qty 15

## 2017-08-06 MED ORDER — SODIUM CHLORIDE 0.9 % IV SOLN
Freq: Once | INTRAVENOUS | Status: AC
  Administered 2017-08-06: 250 mL via INTRAVENOUS

## 2017-08-06 MED ORDER — ACETAMINOPHEN 500 MG PO TABS
1000.0000 mg | ORAL_TABLET | Freq: Once | ORAL | Status: AC
  Administered 2017-08-06: 1000 mg via ORAL
  Filled 2017-08-06: qty 2

## 2017-09-03 ENCOUNTER — Encounter (HOSPITAL_COMMUNITY)
Admission: RE | Admit: 2017-09-03 | Discharge: 2017-09-03 | Disposition: A | Discharge status: Home / Self Care | Payer: Medicare Other | Source: Ambulatory Visit | Attending: Neurology | Admitting: Neurology

## 2017-09-03 ENCOUNTER — Encounter (HOSPITAL_COMMUNITY): Payer: Self-pay

## 2017-09-03 DIAGNOSIS — G35 Multiple sclerosis: Secondary | ICD-10-CM | POA: Diagnosis not present

## 2017-09-03 MED ORDER — SODIUM CHLORIDE 0.9 % IV SOLN
Freq: Once | INTRAVENOUS | Status: AC
  Administered 2017-09-03: 13:00:00 via INTRAVENOUS

## 2017-09-03 MED ORDER — LORATADINE 10 MG PO TABS
10.0000 mg | ORAL_TABLET | Freq: Once | ORAL | Status: AC
  Administered 2017-09-03: 10 mg via ORAL
  Filled 2017-09-03: qty 1

## 2017-09-03 MED ORDER — ACETAMINOPHEN 500 MG PO TABS
1000.0000 mg | ORAL_TABLET | Freq: Once | ORAL | Status: AC
  Administered 2017-09-03: 1000 mg via ORAL
  Filled 2017-09-03: qty 2

## 2017-09-03 MED ORDER — SODIUM CHLORIDE 0.9 % IV SOLN
300.0000 mg | Freq: Once | INTRAVENOUS | Status: AC
  Administered 2017-09-03: 300 mg via INTRAVENOUS
  Filled 2017-09-03: qty 15

## 2017-10-01 ENCOUNTER — Encounter (HOSPITAL_COMMUNITY)
Admission: RE | Admit: 2017-10-01 | Discharge: 2017-10-01 | Disposition: A | Discharge status: Home / Self Care | Payer: Medicare Other | Source: Ambulatory Visit | Attending: Neurology | Admitting: Neurology

## 2017-10-01 DIAGNOSIS — G35 Multiple sclerosis: Secondary | ICD-10-CM | POA: Insufficient documentation

## 2017-10-01 NOTE — Progress Notes (Signed)
Patient's mother cancelled appointment for today, as patient is sleeping and doesn't want to come to appointment.  Offered to reschedule.  States she will call us back.

## 2017-10-20 ENCOUNTER — Ambulatory Visit (HOSPITAL_COMMUNITY)
Admission: RE | Admit: 2017-10-20 | Discharge: 2017-10-20 | Disposition: A | Discharge status: Home / Self Care | Payer: Medicare Other | Source: Ambulatory Visit | Attending: Nurse Practitioner | Admitting: Nurse Practitioner

## 2017-10-20 ENCOUNTER — Other Ambulatory Visit (HOSPITAL_COMMUNITY): Payer: Self-pay | Admitting: Nurse Practitioner

## 2017-10-20 DIAGNOSIS — R0602 Shortness of breath: Secondary | ICD-10-CM | POA: Diagnosis present

## 2017-10-20 DIAGNOSIS — J449 Chronic obstructive pulmonary disease, unspecified: Secondary | ICD-10-CM | POA: Insufficient documentation

## 2017-11-27 ENCOUNTER — Emergency Department (HOSPITAL_COMMUNITY): Payer: Medicare Other

## 2017-11-27 ENCOUNTER — Other Ambulatory Visit: Payer: Self-pay

## 2017-11-27 ENCOUNTER — Encounter (HOSPITAL_COMMUNITY): Payer: Self-pay

## 2017-11-27 ENCOUNTER — Inpatient Hospital Stay (HOSPITAL_COMMUNITY)
Admission: EM | Admit: 2017-11-27 | Discharge: 2017-11-30 | DRG: 439 | Disposition: A | Discharge status: Home / Self Care | Payer: Medicare Other | Attending: Internal Medicine | Admitting: Internal Medicine

## 2017-11-27 DIAGNOSIS — R195 Other fecal abnormalities: Secondary | ICD-10-CM | POA: Diagnosis present

## 2017-11-27 DIAGNOSIS — J449 Chronic obstructive pulmonary disease, unspecified: Secondary | ICD-10-CM | POA: Diagnosis present

## 2017-11-27 DIAGNOSIS — K219 Gastro-esophageal reflux disease without esophagitis: Secondary | ICD-10-CM | POA: Diagnosis present

## 2017-11-27 DIAGNOSIS — F17210 Nicotine dependence, cigarettes, uncomplicated: Secondary | ICD-10-CM | POA: Diagnosis present

## 2017-11-27 DIAGNOSIS — I10 Essential (primary) hypertension: Secondary | ICD-10-CM | POA: Diagnosis present

## 2017-11-27 DIAGNOSIS — G35 Multiple sclerosis: Secondary | ICD-10-CM | POA: Diagnosis not present

## 2017-11-27 DIAGNOSIS — R112 Nausea with vomiting, unspecified: Secondary | ICD-10-CM | POA: Diagnosis not present

## 2017-11-27 DIAGNOSIS — F419 Anxiety disorder, unspecified: Secondary | ICD-10-CM | POA: Diagnosis present

## 2017-11-27 DIAGNOSIS — M549 Dorsalgia, unspecified: Secondary | ICD-10-CM

## 2017-11-27 DIAGNOSIS — F1020 Alcohol dependence, uncomplicated: Secondary | ICD-10-CM | POA: Diagnosis present

## 2017-11-27 DIAGNOSIS — K861 Other chronic pancreatitis: Secondary | ICD-10-CM | POA: Diagnosis present

## 2017-11-27 DIAGNOSIS — Z720 Tobacco use: Secondary | ICD-10-CM | POA: Diagnosis present

## 2017-11-27 DIAGNOSIS — G35D Multiple sclerosis, unspecified: Secondary | ICD-10-CM | POA: Diagnosis present

## 2017-11-27 DIAGNOSIS — N400 Enlarged prostate without lower urinary tract symptoms: Secondary | ICD-10-CM | POA: Diagnosis present

## 2017-11-27 DIAGNOSIS — Z8249 Family history of ischemic heart disease and other diseases of the circulatory system: Secondary | ICD-10-CM | POA: Diagnosis not present

## 2017-11-27 DIAGNOSIS — R748 Abnormal levels of other serum enzymes: Secondary | ICD-10-CM

## 2017-11-27 DIAGNOSIS — F410 Panic disorder [episodic paroxysmal anxiety] without agoraphobia: Secondary | ICD-10-CM | POA: Diagnosis present

## 2017-11-27 DIAGNOSIS — F1010 Alcohol abuse, uncomplicated: Secondary | ICD-10-CM | POA: Diagnosis not present

## 2017-11-27 DIAGNOSIS — R638 Other symptoms and signs concerning food and fluid intake: Secondary | ICD-10-CM

## 2017-11-27 DIAGNOSIS — T40605A Adverse effect of unspecified narcotics, initial encounter: Secondary | ICD-10-CM | POA: Diagnosis present

## 2017-11-27 DIAGNOSIS — Z7951 Long term (current) use of inhaled steroids: Secondary | ICD-10-CM

## 2017-11-27 DIAGNOSIS — K860 Alcohol-induced chronic pancreatitis: Secondary | ICD-10-CM | POA: Diagnosis present

## 2017-11-27 DIAGNOSIS — K859 Acute pancreatitis without necrosis or infection, unspecified: Secondary | ICD-10-CM | POA: Diagnosis present

## 2017-11-27 DIAGNOSIS — F1120 Opioid dependence, uncomplicated: Secondary | ICD-10-CM | POA: Diagnosis present

## 2017-11-27 DIAGNOSIS — M545 Low back pain: Secondary | ICD-10-CM | POA: Diagnosis not present

## 2017-11-27 DIAGNOSIS — R109 Unspecified abdominal pain: Secondary | ICD-10-CM | POA: Diagnosis not present

## 2017-11-27 DIAGNOSIS — I952 Hypotension due to drugs: Secondary | ICD-10-CM | POA: Diagnosis present

## 2017-11-27 DIAGNOSIS — K852 Alcohol induced acute pancreatitis without necrosis or infection: Secondary | ICD-10-CM | POA: Diagnosis not present

## 2017-11-27 DIAGNOSIS — Z23 Encounter for immunization: Secondary | ICD-10-CM

## 2017-11-27 DIAGNOSIS — Z7952 Long term (current) use of systemic steroids: Secondary | ICD-10-CM

## 2017-11-27 DIAGNOSIS — G8929 Other chronic pain: Secondary | ICD-10-CM | POA: Diagnosis present

## 2017-11-27 DIAGNOSIS — Z79899 Other long term (current) drug therapy: Secondary | ICD-10-CM

## 2017-11-27 DIAGNOSIS — M797 Fibromyalgia: Secondary | ICD-10-CM | POA: Diagnosis present

## 2017-11-27 HISTORY — DX: Other specified health status: Z78.9

## 2017-11-27 HISTORY — DX: Puckering of macula, bilateral: H35.373

## 2017-11-27 HISTORY — DX: Alcohol use, unspecified, uncomplicated: F10.90

## 2017-11-27 HISTORY — DX: Other problems related to lifestyle: Z72.89

## 2017-11-27 LAB — URINALYSIS, ROUTINE W REFLEX MICROSCOPIC
Bilirubin Urine: NEGATIVE
Glucose, UA: NEGATIVE mg/dL
HGB URINE DIPSTICK: NEGATIVE
KETONES UR: NEGATIVE mg/dL
Leukocytes, UA: NEGATIVE
Nitrite: NEGATIVE
Protein, ur: NEGATIVE mg/dL
Specific Gravity, Urine: 1.028 (ref 1.005–1.030)
pH: 7 (ref 5.0–8.0)

## 2017-11-27 LAB — CBC WITH DIFFERENTIAL/PLATELET
ABS IMMATURE GRANULOCYTES: 0.03 10*3/uL (ref 0.00–0.07)
BASOS ABS: 0 10*3/uL (ref 0.0–0.1)
Basophils Relative: 0 %
EOS PCT: 1 %
Eosinophils Absolute: 0.1 10*3/uL (ref 0.0–0.5)
HCT: 46.3 % (ref 39.0–52.0)
HEMOGLOBIN: 15.7 g/dL (ref 13.0–17.0)
Immature Granulocytes: 0 %
LYMPHS PCT: 22 %
Lymphs Abs: 2.1 10*3/uL (ref 0.7–4.0)
MCH: 32.8 pg (ref 26.0–34.0)
MCHC: 33.9 g/dL (ref 30.0–36.0)
MCV: 96.9 fL (ref 80.0–100.0)
Monocytes Absolute: 0.7 10*3/uL (ref 0.1–1.0)
Monocytes Relative: 7 %
NEUTROS ABS: 6.7 10*3/uL (ref 1.7–7.7)
NRBC: 0 % (ref 0.0–0.2)
Neutrophils Relative %: 70 %
Platelets: 297 10*3/uL (ref 150–400)
RBC: 4.78 MIL/uL (ref 4.22–5.81)
RDW: 13.2 % (ref 11.5–15.5)
WBC: 9.7 10*3/uL (ref 4.0–10.5)

## 2017-11-27 LAB — LIPID PANEL
CHOL/HDL RATIO: 3.2 ratio
Cholesterol: 93 mg/dL (ref 0–200)
HDL: 29 mg/dL — ABNORMAL LOW (ref >40)
LDL Cholesterol: 36 mg/dL (ref 0–99)
Triglycerides: 141 mg/dL (ref <150)
VLDL: 28 mg/dL (ref 0–40)

## 2017-11-27 LAB — RAPID URINE DRUG SCREEN, HOSP PERFORMED
AMPHETAMINES: NOT DETECTED
Barbiturates: NOT DETECTED
Benzodiazepines: POSITIVE — AB
Cocaine: NOT DETECTED
Opiates: POSITIVE — AB
Tetrahydrocannabinol: NOT DETECTED

## 2017-11-27 LAB — COMPREHENSIVE METABOLIC PANEL
ALT: 13 U/L (ref 0–44)
ANION GAP: 7 (ref 5–15)
AST: 16 U/L (ref 15–41)
Albumin: 3.2 g/dL — ABNORMAL LOW (ref 3.5–5.0)
Alkaline Phosphatase: 78 U/L (ref 38–126)
BUN: <5 mg/dL — ABNORMAL LOW (ref 6–20)
CO2: 26 mmol/L (ref 22–32)
Calcium: 8.5 mg/dL — ABNORMAL LOW (ref 8.9–10.3)
Chloride: 104 mmol/L (ref 98–111)
Creatinine, Ser: 0.69 mg/dL (ref 0.61–1.24)
GFR calc non Af Amer: >60 mL/min (ref >60)
Glucose, Bld: 96 mg/dL (ref 70–99)
Potassium: 3.6 mmol/L (ref 3.5–5.1)
SODIUM: 137 mmol/L (ref 135–145)
Total Bilirubin: 0.5 mg/dL (ref 0.3–1.2)
Total Protein: 6.2 g/dL — ABNORMAL LOW (ref 6.5–8.1)

## 2017-11-27 LAB — LIPASE, BLOOD: Lipase: 72 U/L — ABNORMAL HIGH (ref 11–51)

## 2017-11-27 LAB — ETHANOL

## 2017-11-27 MED ORDER — SODIUM CHLORIDE 0.9 % IV BOLUS
1000.0000 mL | Freq: Once | INTRAVENOUS | Status: AC
  Administered 2017-11-27: 1000 mL via INTRAVENOUS

## 2017-11-27 MED ORDER — HYDROCORTISONE NA SUCCINATE PF 100 MG IJ SOLR: 50.0000 mg | Freq: Four times a day (QID) | INTRAMUSCULAR | Status: DC

## 2017-11-27 MED ORDER — INFLUENZA VAC SPLIT QUAD 0.5 ML IM SUSY
0.5000 mL | PREFILLED_SYRINGE | INTRAMUSCULAR | Status: AC
  Administered 2017-11-28: 0.5 mL via INTRAMUSCULAR
  Filled 2017-11-27: qty 0.5

## 2017-11-27 MED ORDER — IOPAMIDOL (ISOVUE-300) INJECTION 61%
100.0000 mL | Freq: Once | INTRAVENOUS | Status: AC | PRN
  Administered 2017-11-27: 100 mL via INTRAVENOUS

## 2017-11-27 MED ORDER — PREDNISOLONE ACETATE 1 % OP SUSP
2.0000 [drp] | Freq: Two times a day (BID) | OPHTHALMIC | Status: DC
  Administered 2017-11-27 – 2017-11-30 (×6): 2 [drp] via OPHTHALMIC
  Filled 2017-11-27: qty 1

## 2017-11-27 MED ORDER — PREDNISONE 10 MG PO TABS: 5.0000 mg | ORAL_TABLET | Freq: Every day | ORAL | Status: DC

## 2017-11-27 MED ORDER — ONDANSETRON HCL 4 MG/2ML IJ SOLN
4.0000 mg | INTRAMUSCULAR | Status: AC | PRN
  Administered 2017-11-27 (×2): 4 mg via INTRAVENOUS
  Filled 2017-11-27 (×2): qty 2

## 2017-11-27 MED ORDER — FENTANYL CITRATE (PF) 100 MCG/2ML IJ SOLN
50.0000 ug | INTRAMUSCULAR | Status: DC | PRN
  Administered 2017-11-27: 50 ug via INTRAVENOUS
  Filled 2017-11-27: qty 2

## 2017-11-27 MED ORDER — SODIUM CHLORIDE 0.9 % IV SOLN
INTRAVENOUS | Status: DC
  Administered 2017-11-27 – 2017-11-30 (×11): via INTRAVENOUS

## 2017-11-27 MED ORDER — MOMETASONE FURO-FORMOTEROL FUM 100-5 MCG/ACT IN AERO
2.0000 | INHALATION_SPRAY | Freq: Two times a day (BID) | RESPIRATORY_TRACT | Status: DC
  Administered 2017-11-28 – 2017-11-30 (×4): 2 via RESPIRATORY_TRACT
  Filled 2017-11-27: qty 8.8

## 2017-11-27 MED ORDER — FAMOTIDINE IN NACL 20-0.9 MG/50ML-% IV SOLN
20.0000 mg | Freq: Once | INTRAVENOUS | Status: AC
  Administered 2017-11-27: 20 mg via INTRAVENOUS
  Filled 2017-11-27: qty 50

## 2017-11-27 MED ORDER — ALPRAZOLAM 0.5 MG PO TABS
0.5000 mg | ORAL_TABLET | Freq: Three times a day (TID) | ORAL | Status: DC | PRN
  Administered 2017-11-27 – 2017-11-30 (×7): 0.5 mg via ORAL
  Filled 2017-11-27 (×7): qty 1

## 2017-11-27 MED ORDER — ALBUTEROL SULFATE (2.5 MG/3ML) 0.083% IN NEBU: 2.5000 mg | INHALATION_SOLUTION | RESPIRATORY_TRACT | Status: DC | PRN

## 2017-11-27 MED ORDER — ONDANSETRON HCL 4 MG PO TABS: 4.0000 mg | ORAL_TABLET | Freq: Four times a day (QID) | ORAL | Status: DC | PRN

## 2017-11-27 MED ORDER — SODIUM CHLORIDE 0.9 % IV SOLN
INTRAVENOUS | Status: DC
  Administered 2017-11-27 (×2): via INTRAVENOUS

## 2017-11-27 MED ORDER — ONDANSETRON HCL 4 MG/2ML IJ SOLN
4.0000 mg | Freq: Four times a day (QID) | INTRAMUSCULAR | Status: DC | PRN
  Administered 2017-11-27 – 2017-11-29 (×5): 4 mg via INTRAVENOUS
  Filled 2017-11-27 (×5): qty 2

## 2017-11-27 MED ORDER — PREDNISOLONE ACETATE 1 % OP SUSP
OPHTHALMIC | Status: AC
  Filled 2017-11-27: qty 5

## 2017-11-27 MED ORDER — HYDROCORTISONE NA SUCCINATE PF 100 MG IJ SOLR
25.0000 mg | Freq: Four times a day (QID) | INTRAMUSCULAR | Status: AC
  Administered 2017-11-27 – 2017-11-28 (×3): 25 mg via INTRAVENOUS
  Filled 2017-11-27 (×3): qty 2

## 2017-11-27 MED ORDER — ENOXAPARIN SODIUM 40 MG/0.4ML ~~LOC~~ SOLN
40.0000 mg | SUBCUTANEOUS | Status: DC
  Administered 2017-11-27 – 2017-11-29 (×3): 40 mg via SUBCUTANEOUS
  Filled 2017-11-27 (×3): qty 0.4

## 2017-11-27 MED ORDER — MORPHINE SULFATE (PF) 4 MG/ML IV SOLN
4.0000 mg | INTRAVENOUS | Status: AC | PRN
  Administered 2017-11-27 (×2): 4 mg via INTRAVENOUS
  Filled 2017-11-27 (×2): qty 1

## 2017-11-27 MED ORDER — NICOTINE 14 MG/24HR TD PT24
14.0000 mg | MEDICATED_PATCH | Freq: Every day | TRANSDERMAL | Status: DC
  Administered 2017-11-27 – 2017-11-30 (×4): 14 mg via TRANSDERMAL
  Filled 2017-11-27 (×4): qty 1

## 2017-11-27 MED ORDER — PANTOPRAZOLE SODIUM 40 MG PO TBEC
40.0000 mg | DELAYED_RELEASE_TABLET | Freq: Every day | ORAL | Status: DC
  Administered 2017-11-27 – 2017-11-30 (×4): 40 mg via ORAL
  Filled 2017-11-27 (×4): qty 1

## 2017-11-27 MED ORDER — KETOROLAC TROMETHAMINE 0.5 % OP SOLN
2.0000 [drp] | Freq: Four times a day (QID) | OPHTHALMIC | Status: DC
  Administered 2017-11-28 – 2017-11-30 (×6): 2 [drp] via OPHTHALMIC
  Filled 2017-11-27: qty 5

## 2017-11-27 MED ORDER — PREDNISONE 10 MG PO TABS
5.0000 mg | ORAL_TABLET | Freq: Every day | ORAL | Status: DC
  Administered 2017-11-29 – 2017-11-30 (×2): 5 mg via ORAL
  Filled 2017-11-27 (×3): qty 1

## 2017-11-27 MED ORDER — ZOLPIDEM TARTRATE 5 MG PO TABS
5.0000 mg | ORAL_TABLET | Freq: Every evening | ORAL | Status: DC | PRN
  Administered 2017-11-29: 5 mg via ORAL
  Filled 2017-11-27: qty 1

## 2017-11-27 MED ORDER — MORPHINE SULFATE (PF) 2 MG/ML IV SOLN
1.0000 mg | INTRAVENOUS | Status: DC | PRN
  Administered 2017-11-27 – 2017-11-28 (×6): 1 mg via INTRAVENOUS
  Filled 2017-11-27 (×6): qty 1

## 2017-11-27 MED ORDER — POLYETHYLENE GLYCOL 3350 17 G PO PACK: 17.0000 g | PACK | Freq: Every day | ORAL | Status: DC | PRN

## 2017-11-27 NOTE — H&P (Addendum)
History and Physical  Brent [name] ZOX:[id number] DOB: 1970/02/21 DOA: 11/27/2017  Referring physician: Clarene Duke PCP: Gareth Morgan, MD   Chief Complaint: abdominal pain   HPI: Brent [name] is a 47 y.o. male alcoholic with chronic pancreatitis who takes pancreatic enzymes presents to ED with 3-4 days of progressive abdominal pain.  He claims that he has not had a drink of alcohol in over 6 weeks.  The symptoms have progressed and have been associated with intermittent episodes of diarrhea that started 4 days ago in addition to nausea with emesis.  The patient describes the pain as sharp epigastric pain that radiates to the back.  The patient denies chest pain or shortness of breath.  He again reiterates that he has not had alcohol to drink in 6 weeks.  The patient says he is only been able to tolerate sips of liquids but overall has had very poor oral intake over the past couple of days.  He says that he has not had diarrhea in the last 2 days but mostly he feels like because he has had no oral intake.  He mostly complains of pain 9/10 relieved by IV pain medications given in the ED.  He is a chronic opioid dependent patient that takes them due to chronic low back pain.  He was noted to have an elevated lipase level.  He is being admitted for further management.  Review of Systems: All systems reviewed and apart from history of presenting illness, are negative.  Past Medical History:  Diagnosis Date  . Alcohol use   . Anxiety    Disabled due to panic attacks  . Arthritis   . Bilateral ureteral calculi   . Borderline type 2 diabetes mellitus   . BPH (benign prostatic hypertrophy)   . Chronic back pain   . Condylomata acuminata in male    multiple procedures --  penile, peri-rectal , perineum  . DDD (degenerative disc disease), cervical   . Depression   . Dyspnea on exertion   . Emphysematous COPD (HCC)   . Epiretinal membrane (ERM) of both eyes   . Fibromyalgia   . GERD  (gastroesophageal reflux disease)   . Headache(784.0)   . History of chronic pancreatitis    severeal adx for this /  2008  dx alcoholic pancreatitis  . History of esophageal dilatation   . History of hiatal hernia   . History of kidney stones   . History of panic attacks   . History of sepsis    adx 05-01-2014--  urosepsis due to kidney stones obstruction/ hydronephrosis  . History of suicidal ideation    2006   adx  . Hypertension    was on medication for short time, htn was caused by prednisone per pt  . Multiple sclerosis (HCC)    pt. states has 4 small brain lesions  . Nephrolithiasis    bilateral   . Retinal vasculitis   . Uveitis    Past Surgical History:  Procedure Laterality Date  . BIOPSY  11/12/2012   Procedure: GASTRIC AND ESOPHAGEAL BIOPSIES;  Surgeon: Corbin Ade, MD;  Location: [facility];  Service: Endoscopy;;  . CATARACT EXTRACTION W/ INTRAOCULAR LENS  IMPLANT, BILATERAL    . COLONOSCOPY  10/08/2011   Jenkins:Normal colon/Anal condyloma without extension proximal to dentate line  . CYSTOSCOPY W/ URETERAL STENT PLACEMENT Bilateral 05/01/2014   Procedure: CYSTOSCOPY WITH BILATERAL RETROGRADE PYELOGRAM; BILATERAL URETERAL STENT PLACEMENT;  Surgeon: Barron Alvine, MD;  Location: [facility];  Service: Urology;  Laterality: Bilateral;  . CYSTOSCOPY W/ URETERAL STENT REMOVAL Bilateral 06/06/2014   Procedure: CYSTOSCOPY WITH STENT REMOVAL;  Surgeon: Marcine Matar, MD;  Location[facility];  Service: Urology;  Laterality: Bilateral;  . CYSTOSCOPY WITH URETEROSCOPY  06/06/2014   Procedure: CYSTOSCOPY WITH URETEROSCOPY;  Surgeon: Marcine Matar, MD;  Location: [facility];  Service: Urology;;  . Bluford Kaufmann WITH URETEROSCOPY AND STENT PLACEMENT Bilateral 06/06/2014   Procedure: CYSTOSCOPY WITH J2 STENT EXTRACTION,,URETEROSCOPY WITH EXTRACTION OF STONES,;  Surgeon: Marcine Matar, MD;  Location: [facility];  Service: Urology;   Laterality: Bilateral;  . ELECTROCAUTERY/ DESICCATION OF CONDYLOMA LESIONS  01-15-2008  &  12-29-2009   PENIS, PERI-RECTAL , PERINUEM  . ESOPHAGOGASTRODUODENOSCOPY (EGD) WITH PROPOFOL N/A 11/12/2012   ZTI:WPYKDX esophagus s/p  passage of a Maloney dilator and biopsy. Abnormal gastric mucosa-status post biopsy  . FLEXIBLE BRONCHOSCOPY N/A 08/12/2012   Procedure: FLEXIBLE BRONCHOSCOPY;  Surgeon: Fredirick Maudlin, MD;  Location: [facility];  Service: Pulmonary;  Laterality: N/A;  . MALONEY DILATION N/A 11/12/2012   Procedure: MALONEY DILATION (56mm);  Surgeon: Corbin Ade, MD;  Location: [facility];  Service: Endoscopy;  Laterality: N/A;  . MULTIPLE EXTRACTIONS WITH ALVEOLOPLASTY N/A 04/22/2014   Procedure: MULTIPLE EXTRACTIONS ( 2,3,5,6,7,8,9,10,11,13,14,15,21,28  WITH ALVEOLOPLASTY;  Surgeon: Ocie Doyne, DDS;  Location: [facility];  Service: Oral Surgery;  Laterality: N/A;  . WISDOM TOOTH EXTRACTION     Social History:  reports that he has been smoking cigarettes. He has a 60.00 pack-year smoking history. He has never used smokeless tobacco. He reports that he drinks alcohol. He reports that he does not use drugs.  No Known Allergies  Family History  Problem Relation Age of Onset  . Hypertension Mother   . Breast cancer Mother   . Melanoma Mother   . Stroke Mother   . Lung cancer Father 6  . Colon cancer Neg Hx   . Colitis Neg Hx   . Cirrhosis Neg Hx   . Liver disease Neg Hx   . Pancreatic cancer Neg Hx   . Pancreatitis Neg Hx   . Anesthesia problems Neg Hx   . Hypotension Neg Hx   . Malignant hyperthermia Neg Hx   . Pseudochol deficiency Neg Hx     Prior to Admission medications   Medication Sig Start Date End Date Taking? Authorizing Provider  ALPRAZolam Prudy Feeler) 1 MG tablet Take 1 mg by mouth 4 (four) times daily.   Yes [provider]  Aspirin-Caffeine (BC FAST PAIN RELIEF) 845-65 MG PACK Take 1 packet by mouth every morning.   Yes [provider]  CREON 3000-9500  units CPEP Take 1-2 capsules by mouth See admin instructions. 2 capsules with each meal and 1 capsule with each snack. 10/03/17  Yes [provider]  cyanocobalamin ([employer] VITAMIN B12) 2000 MCG tablet Take 2,000 mcg by mouth daily.    Yes [provider]  HYDROcodone-acetaminophen (NORCO) 10-325 MG tablet Take 1 tablet by mouth 2 (two) times daily.  01/03/16  Yes [provider]  ketorolac (ACULAR) 0.4 % SOLN Place 2 drops into both eyes 4 (four) times daily.  06/24/14  Yes [provider]  omeprazole (PRILOSEC) 40 MG capsule Take 40 mg by mouth at bedtime.    Yes [provider]  prednisoLONE acetate (PRED FORTE) 1 % ophthalmic suspension Place 2 drops into both eyes 2 (two) times daily.  01/24/15  Yes [provider]  predniSONE (DELTASONE) 5 MG  tablet Take 5 mg by mouth daily. 02/22/15  Yes [provider]  SYMBICORT 80-4.5 MCG/ACT inhaler Inhale 1 puff into the lungs every 4 (four) hours.  08/23/17  Yes [provider]  zolpidem (AMBIEN) 10 MG tablet Take 10 mg by mouth at bedtime.   Yes [provider]   Physical Exam: Vitals:   11/27/17 0830 11/27/17 1017 11/27/17 1030 11/27/17 1100  BP: 117/87 107/86 106/83 106/83  Pulse: 93 76 80 80  Resp:      Temp:      TempSrc:      SpO2: 95% 96% 95% 94%  Weight:      Height:         General exam: thin emaciated appearing male, appears poorly nourished, standing up in room, in no obvious distress.  Head, eyes and ENT: Nontraumatic and normocephalic. Pupils equally reacting to light and accommodation. Oral mucosa dry.  Neck: Supple. No JVD, carotid bruit or thyromegaly.  Lymphatics: No lymphadenopathy.  Respiratory system: Clear to auscultation. No increased work of breathing.  Cardiovascular system: S1 and S2 heard. No JVD, murmurs, gallops, clicks or pedal edema.  Gastrointestinal system: Abdomen is nondistended, soft and diffuse epigastric tenderness with  guarding. Normal bowel sounds heard. No organomegaly or masses appreciated.  Central nervous system: Alert and oriented. No focal neurological deficits.  Extremities: Symmetric 5 x 5 power. Peripheral pulses symmetrically felt.   Skin: No rashes or acute findings.  Musculoskeletal system: Negative exam.  Psychiatry: Pleasant and cooperative.  Labs on Admission:  Basic Metabolic Panel: Recent Labs  Lab 11/27/17 0802  NA 137  K 3.6  CL 104  CO2 26  GLUCOSE 96  BUN <5*  CREATININE 0.69  CALCIUM 8.5*   Liver Function Tests: Recent Labs  Lab 11/27/17 0802  AST 16  ALT 13  ALKPHOS 78  BILITOT 0.5  PROT 6.2*  ALBUMIN 3.2*   Recent Labs  Lab 11/27/17 0802  LIPASE 72*   No results for input(s): AMMONIA in the last 168 hours. CBC: Recent Labs  Lab 11/27/17 0802  WBC 9.7  NEUTROABS 6.7  HGB 15.7  HCT 46.3  MCV 96.9  PLT 297   Cardiac Enzymes: No results for input(s): CKTOTAL, CKMB, CKMBINDEX, TROPONINI in the last 168 hours.  BNP (last 3 results) No results for input(s): PROBNP in the last 8760 hours. CBG: No results for input(s): GLUCAP in the last 168 hours.  Radiological Exams on Admission: Dg Chest 2 View  Result Date: 11/27/2017 CLINICAL DATA:  Left lower abdominal pain EXAM: CHEST - 2 VIEW COMPARISON:  10/20/2017 FINDINGS: There is hyperinflation of the lungs compatible with COPD. Scarring in the left upper lobe, stable. No acute confluent airspace opacities or effusions. Heart is normal size. No acute bony abnormality. IMPRESSION: COPD/chronic changes.  No active disease. Electronically Signed   By: Charlett Nose M.D.   On: 11/27/2017 09:08   Ct Abdomen Pelvis W Contrast  Result Date: 11/27/2017 CLINICAL DATA:  Generalized abdominal pain, diarrhea. EXAM: CT ABDOMEN AND PELVIS WITH CONTRAST TECHNIQUE: Multidetector CT imaging of the abdomen and pelvis was performed using the standard protocol following bolus administration of intravenous contrast.  CONTRAST:  ISOVUE-300 IOPAMIDOL (ISOVUE-300) INJECTION 61% COMPARISON:  CT scan of November 07, 2014. FINDINGS: Lower chest: Mild bilateral posterior basilar subsegmental atelectasis is noted. Hepatobiliary: No focal liver abnormality is seen. No gallstones, gallbladder wall thickening, or biliary dilatation. Pancreas: Mild inflammatory changes are seen around the pancreatic tail. No significant ductal dilatation  is noted. No pseudocyst formation is noted. Spleen: Normal in size without focal abnormality. Adrenals/Urinary Tract: Adrenal glands are unremarkable. Kidneys are normal, without renal calculi, focal lesion, or hydronephrosis. Bladder is unremarkable. Stomach/Bowel: Stomach is within normal limits. Appendix appears normal. No evidence of bowel wall thickening, distention, or inflammatory changes. Vascular/Lymphatic: Aortic atherosclerosis. No enlarged abdominal or pelvic lymph nodes. Reproductive: Prostate is unremarkable. Other: No abdominal wall hernia or abnormality. No abdominopelvic ascites. Musculoskeletal: Severe degenerative disc disease is noted at L4-5. Old L2 fracture is noted. No acute osseous abnormality is noted. IMPRESSION: Mild inflammatory changes are seen around pancreatic tail suggesting acute pancreatitis. No pseudocyst formation is noted. Aortic Atherosclerosis (ICD10-I70.0). Electronically Signed   By: Lupita Raider, M.D.   On: 11/27/2017 10:13    Assessment/Plan Principal Problem:   Acute pancreatitis Active Problems:   ETOH abuse   Chronic back pain   Tobacco abuse   Pancreatitis, alcoholic, acute   Elevated lipase   Loose stools   Decreased oral intake   Nausea and vomiting  1. Acute on chronic pancreatitis- this appears to be a mild flare.  The patient adamantly denies that he has had any recent alcohol consumption in the past 6 weeks.  He is being admitted for pain management and supportive therapy with IV fluids.  IV pain and nausea medications have been  ordered.  Follow and trend lipase levels.  Keep the patient n.p.o. except sips with meds at this time.  The CT scan did not show any gallbladder wall thickening or gallstones.  Check a lipid panel.  I wonder if the patient has consumed some alcohol.  Check a serum ethanol level.  Monitor medications for those that may potentially cause pancreatitis. 2. Chronic alcoholism- patient claims no recent alcohol consumption and therefore did not order a CIWA protocol however did order his serum ethanol level. 3. Nausea and vomiting-secondary to acute on chronic pancreatitis-treating with supportive therapy, IV nausea and pain medications.  Keep n.p.o. except sips with meds for now. 4. MULTIPLE SCLEROSIS - he is on daily prednisone, will do short course of stress dose steroid for acute illness.   5. Loose stools- patient says he has had no diarrhea or loose stools in the past couple of days.  If he does have recurrence of symptoms would consider stool studies at that time. 6. Tobacco abuse-will offer nicotine patch to use while in the hospital.  Counseled the patient on the importance of tobacco cessation and he verbalized understanding. 7. Chronic back pain and opioid dependence- IV pain medication ordered while he is being treated for the acute on chronic pancreatitis.  DVT Prophylaxis: Lovenox Code Status: Full Family Communication: Patient updated fully at bedside and verbalized understanding Disposition Plan: Inpatient to MedSurg for IV fluids, IV pain management, etc.  Time spent: 58 minutes  Standley Dakins, MD [facility] Pager [phone]  If 7PM-7AM, please contact night-coverage [email or web address] Password [email or web address] 11/27/2017, 11:48 AM

## 2017-11-27 NOTE — Progress Notes (Signed)
11/27/2017 2:48 PM  Pt having hypotension after receiving IV narcotics in ED.  I came and assessed him and found him to be sedated from medication but easily arousable and mentating normally.  Will bolus IVFs.  Hold opioids until blood pressures have improved.    Maryln Manuel MD

## 2017-11-27 NOTE — ED Notes (Signed)
Advised patient again we needed urine specimen.  Patient was sleeping with head covered.

## 2017-11-27 NOTE — ED Provider Notes (Signed)
[facility] EMERGENCY DEPARTMENT Provider Note   CSN: [id number] Arrival date & time: 11/27/17  4098     History   Chief Complaint Chief Complaint  Patient presents with  . Abdominal Pain    HPI Brent [name] is a 47 y.o. male.  HPI  Pt was seen at 0710.  Per pt, c/o gradual onset and worsening of constant left sided abd "pain" for the past 3 weeks. Has been associated with multiple intermittent episodes of diarrhea x4 days. Has been associated with decreased PO intake.   Describes the abd pain as "sharp." Pt has been taking his usual narcotic pain medications without improvement. Denies N/V, no fevers, no back pain, no rash, no CP/SOB, no black or blood in stools, no dysuria/hematuria, no testicular pain/swelling.       Past Medical History:  Diagnosis Date  . Alcohol use   . Anxiety    Disabled due to panic attacks  . Arthritis   . Bilateral ureteral calculi   . Borderline type 2 diabetes mellitus   . BPH (benign prostatic hypertrophy)   . Chronic back pain   . Condylomata acuminata in male    multiple procedures --  penile, peri-rectal , perineum  . DDD (degenerative disc disease), cervical   . Depression   . Dyspnea on exertion   . Emphysematous COPD (HCC)   . Epiretinal membrane (ERM) of both eyes   . Fibromyalgia   . GERD (gastroesophageal reflux disease)   . Headache(784.0)   . History of chronic pancreatitis    severeal adx for this /  2008  dx alcoholic pancreatitis  . History of esophageal dilatation   . History of hiatal hernia   . History of kidney stones   . History of panic attacks   . History of sepsis    adx 05-01-2014--  urosepsis due to kidney stones obstruction/ hydronephrosis  . History of suicidal ideation    2006   adx  . Hypertension    was on medication for short time, htn was caused by prednisone per pt  . Multiple sclerosis (HCC)    pt. states has 4 small brain lesions  . Nephrolithiasis    bilateral   . Retinal vasculitis   .  Uveitis     Patient Active Problem List   Diagnosis Date Noted  . Pancreatitis, alcoholic, acute 02/18/2016  . Acute alcoholic pancreatitis 11/07/2014  . Bilateral ureteral calculi   . UTI (lower urinary tract infection)   . Ureteral calculi 05/02/2014  . Ureterolithiasis 05/01/2014  . UTI (urinary tract infection) 05/01/2014  . Sepsis (HCC) 05/01/2014  . Hydronephrosis 05/01/2014  . Tobacco abuse 05/01/2014  . Failure to thrive (0-17) 01/22/2014  . Melena 11/03/2012  . Odynophagia 11/03/2012  . Esophageal dysphagia 11/03/2012  . ETOH abuse 08/31/2011  . Chronic back pain   . Anxiety   . Weight loss, abnormal 05/04/2010  . Epigastric pain 05/04/2010  . Rectal bleeding 05/04/2010  . Rectal pain 05/04/2010    Past Surgical History:  Procedure Laterality Date  . BIOPSY  11/12/2012   Procedure: GASTRIC AND ESOPHAGEAL BIOPSIES;  Surgeon: Corbin Ade, MD;  Location: [facility];  Service: Endoscopy;;  . CATARACT EXTRACTION W/ INTRAOCULAR LENS  IMPLANT, BILATERAL    . COLONOSCOPY  10/08/2011   Jenkins:Normal colon/Anal condyloma without extension proximal to dentate line  . CYSTOSCOPY W/ URETERAL STENT PLACEMENT Bilateral 05/01/2014   Procedure: CYSTOSCOPY WITH BILATERAL RETROGRADE PYELOGRAM; BILATERAL URETERAL STENT PLACEMENT;  Surgeon: Onalee Hua  Isabel Caprice, MD;  Location: [facility];  Service: Urology;  Laterality: Bilateral;  . CYSTOSCOPY W/ URETERAL STENT REMOVAL Bilateral 06/06/2014   Procedure: CYSTOSCOPY WITH STENT REMOVAL;  Surgeon: Marcine Matar, MD;  Location: [facility];  Service: Urology;  Laterality: Bilateral;  . CYSTOSCOPY WITH URETEROSCOPY  06/06/2014   Procedure: CYSTOSCOPY WITH URETEROSCOPY;  Surgeon: Marcine Matar, MD;  Location: [facility];  Service: Urology;;  . Bluford Kaufmann WITH URETEROSCOPY AND STENT PLACEMENT Bilateral 06/06/2014   Procedure: CYSTOSCOPY WITH J2 STENT EXTRACTION,,URETEROSCOPY WITH EXTRACTION OF STONES,;  Surgeon: Marcine Matar, MD;  Location: [facility];  Service: Urology;  Laterality: Bilateral;  . ELECTROCAUTERY/ DESICCATION OF CONDYLOMA LESIONS  01-15-2008  &  12-29-2009   PENIS, PERI-RECTAL , PERINUEM  . 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 Abnormal gastric mucosa-status post biopsy  . FLEXIBLE BRONCHOSCOPY N/A 08/12/2012   Procedure: FLEXIBLE BRONCHOSCOPY;  Surgeon: Fredirick Maudlin, MD;  Location: [facility];  Service: Pulmonary;  Laterality: N/A;  . MALONEY DILATION N/A 11/12/2012   Procedure: MALONEY DILATION (54mm);  Surgeon: Corbin Ade, MD;  Location: [facility];  Service: Endoscopy;  Laterality: N/A;  . MULTIPLE EXTRACTIONS WITH ALVEOLOPLASTY N/A 04/22/2014   Procedure: MULTIPLE EXTRACTIONS ( 2,3,5,6,7,8,9,10,11,13,14,15,21,28  WITH ALVEOLOPLASTY;  Surgeon: Ocie Doyne, DDS;  Location: [facility];  Service: Oral Surgery;  Laterality: N/A;  . WISDOM TOOTH EXTRACTION          Home Medications    Prior to Admission medications   Medication Sig Start Date End Date Taking? Authorizing Provider  ALPRAZolam Prudy Feeler) 1 MG tablet Take 1 mg by mouth 4 (four) times daily.    [provider]  gabapentin (NEURONTIN) 100 MG capsule Take 100 mg by mouth 4 (four) times daily as needed.    [provider]  HYDROcodone-acetaminophen (NORCO) 10-325 MG tablet Take 1 tablet by mouth daily. 01/03/16   [provider]  ketorolac (ACULAR) 0.4 % SOLN Place 1 drop into both eyes 4 (four) times daily. 06/24/14   [provider]  omeprazole (PRILOSEC) 40 MG capsule Take 40 mg by mouth at bedtime.     [provider]  prednisoLONE acetate (PRED FORTE) 1 % ophthalmic suspension Apply 1 drop to eye 2 (two) times daily. 01/24/15   [provider]  predniSONE (DELTASONE) 5 MG tablet Take 5 mg by mouth daily. 02/22/15   [provider]  zolpidem (AMBIEN) 10 MG tablet  Take 10 mg by mouth at bedtime.    [provider]    Family History Family History  Problem Relation Age of Onset  . Hypertension Mother   . Breast cancer Mother   . Melanoma Mother   . Stroke Mother   . Lung cancer Father 85  . Colon cancer Neg Hx   . Colitis Neg Hx   . Cirrhosis Neg Hx   . Liver disease Neg Hx   . Pancreatic cancer Neg Hx   . Pancreatitis Neg Hx   . Anesthesia problems Neg Hx   . Hypotension Neg Hx   . Malignant hyperthermia Neg Hx   . Pseudochol deficiency Neg Hx     Social History Social History   Tobacco Use  . Smoking status: Current Every Day Smoker    Packs/day: 3.00    Years: 20.00    Pack years: 60.00    Types: Cigarettes  . Smokeless tobacco: Never Used  Substance  Use Topics  . Alcohol use: Yes  . Drug use: No     Allergies   Patient has no known allergies.   Review of Systems Review of Systems ROS: Statement: All systems negative except as marked or noted in the HPI; Constitutional: Negative for fever and chills. ; ; Eyes: Negative for eye pain, redness and discharge. ; ; ENMT: Negative for ear pain, hoarseness, nasal congestion, sinus pressure and sore throat. ; ; Cardiovascular: Negative for chest pain, palpitations, diaphoresis, dyspnea and peripheral edema. ; ; Respiratory: Negative for cough, wheezing and stridor. ; ; Gastrointestinal: +abd pain, diarrhea. Negative for nausea, vomiting, blood in stool, hematemesis, jaundice and rectal bleeding. . ; ; Genitourinary: Negative for dysuria, flank pain and hematuria. ; ; Genital:  No penile drainage or rash, no testicular pain or swelling, no scrotal rash or swelling. ;; Musculoskeletal: Negative for back pain and neck pain. Negative for swelling and trauma.; ; Skin: Negative for pruritus, rash, abrasions, blisters, bruising and skin lesion.; ; Neuro: Negative for headache, lightheadedness and neck stiffness. Negative for weakness, altered level of consciousness, altered mental  status, extremity weakness, paresthesias, involuntary movement, seizure and syncope.       Physical Exam Updated Vital Signs BP 109/87 (BP Location: Left Arm)   Pulse (!) 114   Temp 99.1 F (37.3 C) (Oral)   Resp 16   Ht 5\' 7"  (1.702 m)   Wt 46.3 kg   SpO2 95%   BMI 15.98 kg/m   Physical Exam 0715; Physical examination:  Nursing notes reviewed; Vital signs and O2 SAT reviewed;  Constitutional: Well developed, Well nourished, Well hydrated, In no acute distress; Head:  Normocephalic, atraumatic; Eyes: EOMI, PERRL, No scleral icterus; ENMT: Mouth and pharynx normal, Mucous membranes moist; Neck: Supple, Full range of motion, No lymphadenopathy; Cardiovascular: Regular rate and rhythm, No gallop; Respiratory: Breath sounds clear & equal bilaterally, No wheezes.  Speaking full sentences with ease, Normal respiratory effort/excursion; Chest: Nontender, Movement normal; Abdomen: Soft, +left sided abd tenderness to palp. No rebound or guarding. Nondistended, Normal bowel sounds; Genitourinary: No CVA tenderness; Extremities: Peripheral pulses normal, No tenderness, No edema, No calf edema or asymmetry.; Neuro: AA&Ox3, Major CN grossly intact.  Speech clear. No gross focal motor or sensory deficits in extremities.; Skin: Color normal, Warm, Dry.   ED Treatments / Results  Labs (all labs ordered are listed, but only abnormal results are displayed)   EKG None  Radiology   Procedures Procedures (including critical care time)  Medications Ordered in ED Medications  famotidine (PEPCID) IVPB 20 mg premix (has no administration in time range)  morphine 4 MG/ML injection 4 mg (has no administration in time range)  ondansetron (ZOFRAN) injection 4 mg (has no administration in time range)  0.9 %  sodium chloride infusion (has no administration in time range)     Initial Impression / Assessment and Plan / ED Course  I have reviewed the triage vital signs and the nursing  notes.  Pertinent labs & imaging results that were available during my care of the patient were reviewed by me and considered in my medical decision making (see chart for details).  MDM Reviewed: previous chart, nursing note and vitals Reviewed previous: labs Interpretation: labs, x-ray and CT scan   Results for orders placed or performed during the hospital encounter of 11/27/17  Comprehensive metabolic panel  Result Value Ref Range   Sodium 137 135 - 145 mmol/L   Potassium 3.6 3.5 - 5.1 mmol/L  Chloride 104 98 - 111 mmol/L   CO2 26 22 - 32 mmol/L   Glucose, Bld 96 70 - 99 mg/dL   BUN <5 (L) 6 - 20 mg/dL   Creatinine, Ser 8.29 0.61 - 1.24 mg/dL   Calcium 8.5 (L) 8.9 - 10.3 mg/dL   Total Protein 6.2 (L) 6.5 - 8.1 g/dL   Albumin 3.2 (L) 3.5 - 5.0 g/dL   AST 16 15 - 41 U/L   ALT 13 0 - 44 U/L   Alkaline Phosphatase 78 38 - 126 U/L   Total Bilirubin 0.5 0.3 - 1.2 mg/dL   GFR calc non Af Amer >60 >60 mL/min   GFR calc Af Amer >60 >60 mL/min   Anion gap 7 5 - 15  Lipase, blood  Result Value Ref Range   Lipase 72 (H) 11 - 51 U/L  CBC with Differential  Result Value Ref Range   WBC 9.7 4.0 - 10.5 K/uL   RBC 4.78 4.22 - 5.81 MIL/uL   Hemoglobin 15.7 13.0 - 17.0 g/dL   HCT 56.2 13.0 - 86.5 %   MCV 96.9 80.0 - 100.0 fL   MCH 32.8 26.0 - 34.0 pg   MCHC 33.9 30.0 - 36.0 g/dL   RDW 78.4 69.6 - 29.5 %   Platelets 297 150 - 400 K/uL   nRBC 0.0 0.0 - 0.2 %   Neutrophils Relative % 70 %   Neutro Abs 6.7 1.7 - 7.7 K/uL   Lymphocytes Relative 22 %   Lymphs Abs 2.1 0.7 - 4.0 K/uL   Monocytes Relative 7 %   Monocytes Absolute 0.7 0.1 - 1.0 K/uL   Eosinophils Relative 1 %   Eosinophils Absolute 0.1 0.0 - 0.5 K/uL   Basophils Relative 0 %   Basophils Absolute 0.0 0.0 - 0.1 K/uL   Immature Granulocytes 0 %   Abs Immature Granulocytes 0.03 0.00 - 0.07 K/uL   Dg Chest 2 View Result Date: 11/27/2017 CLINICAL DATA:  Left lower abdominal pain EXAM: CHEST - 2 VIEW COMPARISON:   10/20/2017 FINDINGS: There is hyperinflation of the lungs compatible with COPD. Scarring in the left upper lobe, stable. No acute confluent airspace opacities or effusions. Heart is normal size. No acute bony abnormality. IMPRESSION: COPD/chronic changes.  No active disease. Electronically Signed   By: Charlett Nose M.D.   On: 11/27/2017 09:08   Ct Abdomen Pelvis W Contrast Result Date: 11/27/2017 CLINICAL DATA:  Generalized abdominal pain, diarrhea. EXAM: CT ABDOMEN AND PELVIS WITH CONTRAST TECHNIQUE: Multidetector CT imaging of the abdomen and pelvis was performed using the standard protocol following bolus administration of intravenous contrast. CONTRAST:  ISOVUE-300 IOPAMIDOL (ISOVUE-300) INJECTION 61% COMPARISON:  CT scan of November 07, 2014. FINDINGS: Lower chest: Mild bilateral posterior basilar subsegmental atelectasis is noted. Hepatobiliary: No focal liver abnormality is seen. No gallstones, gallbladder wall thickening, or biliary dilatation. Pancreas: Mild inflammatory changes are seen around the pancreatic tail. No significant ductal dilatation is noted. No pseudocyst formation is noted. Spleen: Normal in size without focal abnormality. Adrenals/Urinary Tract: Adrenal glands are unremarkable. Kidneys are normal, without renal calculi, focal lesion, or hydronephrosis. Bladder is unremarkable. Stomach/Bowel: Stomach is within normal limits. Appendix appears normal. No evidence of bowel wall thickening, distention, or inflammatory changes. Vascular/Lymphatic: Aortic atherosclerosis. No enlarged abdominal or pelvic lymph nodes. Reproductive: Prostate is unremarkable. Other: No abdominal wall hernia or abnormality. No abdominopelvic ascites. Musculoskeletal: Severe degenerative disc disease is noted at L4-5. Old L2 fracture is noted. No acute osseous  abnormality is noted. IMPRESSION: Mild inflammatory changes are seen around pancreatic tail suggesting acute pancreatitis. No pseudocyst formation is  noted. Aortic Atherosclerosis (ICD10-I70.0). Electronically Signed   By: Lupita Raider, M.D.   On: 11/27/2017 10:13    1130:  Acute pancreatitis on CT scan. Pt states he has been taking his usual narcotic pain medications at home without improvement of his abd pain; will admit. Dx and testing d/w pt.  Questions answered.  Verb understanding, agreeable to admit. T/C returned from [facility] Dr. Laural Benes, case discussed, including:  HPI, pertinent PM/SHx, VS/PE, dx testing, ED course and treatment:  Agreeable to admit.    Final Clinical Impressions(s) / ED Diagnoses   Final diagnoses:  None    ED Discharge Orders    None       Samuel Jester, DO 11/29/17 4917

## 2017-11-27 NOTE — ED Notes (Signed)
Paged Dr. Laural Benes regarding low BP's. Pt asymptomatic at this time.

## 2017-11-27 NOTE — ED Triage Notes (Signed)
Pt in by [facility] for left lower quad abd pain x 4 days, states he has been vomiting and unable to eat x 4 days.  Pt denies diarrhea.

## 2017-11-28 ENCOUNTER — Inpatient Hospital Stay (HOSPITAL_COMMUNITY): Payer: Medicare Other

## 2017-11-28 DIAGNOSIS — M545 Low back pain: Secondary | ICD-10-CM

## 2017-11-28 DIAGNOSIS — G35 Multiple sclerosis: Secondary | ICD-10-CM

## 2017-11-28 DIAGNOSIS — F419 Anxiety disorder, unspecified: Secondary | ICD-10-CM

## 2017-11-28 DIAGNOSIS — G8929 Other chronic pain: Secondary | ICD-10-CM

## 2017-11-28 LAB — CBC WITH DIFFERENTIAL/PLATELET
Abs Immature Granulocytes: 0.05 10*3/uL (ref 0.00–0.07)
BASOS ABS: 0 10*3/uL (ref 0.0–0.1)
BASOS PCT: 0 %
EOS ABS: 0 10*3/uL (ref 0.0–0.5)
Eosinophils Relative: 0 %
HCT: 42.8 % (ref 39.0–52.0)
Hemoglobin: 14 g/dL (ref 13.0–17.0)
IMMATURE GRANULOCYTES: 1 %
Lymphocytes Relative: 6 %
Lymphs Abs: 0.7 10*3/uL (ref 0.7–4.0)
MCH: 32.6 pg (ref 26.0–34.0)
MCHC: 32.7 g/dL (ref 30.0–36.0)
MCV: 99.8 fL (ref 80.0–100.0)
Monocytes Absolute: 0.3 10*3/uL (ref 0.1–1.0)
Monocytes Relative: 2 %
NEUTROS PCT: 91 %
NRBC: 0 % (ref 0.0–0.2)
Neutro Abs: 9.3 10*3/uL — ABNORMAL HIGH (ref 1.7–7.7)
PLATELETS: 275 10*3/uL (ref 150–400)
RBC: 4.29 MIL/uL (ref 4.22–5.81)
RDW: 13 % (ref 11.5–15.5)
WBC: 10.3 10*3/uL (ref 4.0–10.5)

## 2017-11-28 LAB — COMPREHENSIVE METABOLIC PANEL
ALK PHOS: 75 U/L (ref 38–126)
ALT: 10 U/L (ref 0–44)
ANION GAP: 6 (ref 5–15)
AST: 12 U/L — ABNORMAL LOW (ref 15–41)
Albumin: 2.8 g/dL — ABNORMAL LOW (ref 3.5–5.0)
BILIRUBIN TOTAL: 0.9 mg/dL (ref 0.3–1.2)
BUN: 6 mg/dL (ref 6–20)
CALCIUM: 7.8 mg/dL — AB (ref 8.9–10.3)
CO2: 22 mmol/L (ref 22–32)
Chloride: 108 mmol/L (ref 98–111)
Creatinine, Ser: 0.62 mg/dL (ref 0.61–1.24)
GFR calc non Af Amer: >60 mL/min (ref >60)
Glucose, Bld: 105 mg/dL — ABNORMAL HIGH (ref 70–99)
Potassium: 3.8 mmol/L (ref 3.5–5.1)
SODIUM: 136 mmol/L (ref 135–145)
TOTAL PROTEIN: 5.3 g/dL — AB (ref 6.5–8.1)

## 2017-11-28 LAB — LIPASE, BLOOD: Lipase: 133 U/L — ABNORMAL HIGH (ref 11–51)

## 2017-11-28 LAB — MAGNESIUM: MAGNESIUM: 1.9 mg/dL (ref 1.7–2.4)

## 2017-11-28 MED ORDER — MORPHINE SULFATE (PF) 2 MG/ML IV SOLN
2.0000 mg | INTRAVENOUS | Status: DC | PRN
  Administered 2017-11-28 – 2017-11-30 (×11): 2 mg via INTRAVENOUS
  Filled 2017-11-28 (×11): qty 1

## 2017-11-28 NOTE — Progress Notes (Signed)
PROGRESS NOTE    Brent [name]  ZOX:[id number] DOB: 23-Oct-1970 DOA: 11/27/2017 PCP: Gareth Morgan, MD    Brief Narrative:  47 year old male with a history of alcoholic pancreatitis, presented to the hospital with abdominal pain.  Found to have acute pancreatitis.  Admitted for IV fluids, pain management and bowel rest.   Assessment & Plan:   Principal Problem:   Acute on chronic pancreatitis (HCC) Active Problems:   ETOH abuse   Chronic back pain   Anxiety   Tobacco abuse   Pancreatitis, alcoholic, acute   Acute pancreatitis   Loose stools   MS (multiple sclerosis) (HCC)   1. Acute on chronic pancreatitis.  Continues to have significant pain requiring IV pain medicine.  Currently n.p.o. with sips of liquids with oral medications.  Lipase has trended up slightly today.  We will continue n.p.o. on IV fluids as well as pain management for now.  No signs of gallstones or gallbladder wall thickening on imaging.  Triglycerides are normal range. 2. Chronic alcoholism.  Patient reports that he was drinking in the past but has not for quite some time now.  Ethanol level is negative. 3. History of multiple sclerosis.  He is on daily prednisone.  Continue on the same. 4. Chronic back pain.  He is on chronic opiates. 5. Anxiety.  Continue on as needed Xanax   DVT prophylaxis: Lovenox Code Status: Full code  Family Communication: No family present Disposition Plan: Discharge home once able to tolerate p.o.   Consultants:     Procedures:     Antimicrobials:      Subjective: Continues to have severe abdominal pain requiring IV pain medicine.  Had little sips of soda with his medications and reported worsening pain.  Objective: Vitals:   11/27/17 1700 11/27/17 1745 11/27/17 2123 11/28/17 0549  BP: 96/75 114/83 111/79 124/86  Pulse: 87 83 88 77  Resp:  16 20 18   Temp:  98.8 F (37.1 C) 98.5 F (36.9 C) 98.5 F (36.9 C)  TempSrc:  Oral Oral Oral  SpO2: 92% 95% 95%  96%  Weight:      Height:        Intake/Output Summary (Last 24 hours) at 11/28/2017 1355 Last data filed at 11/28/2017 0341 Gross per 24 hour  Intake 4049.8 ml  Output -  Net 4049.8 ml   Filed Weights   11/27/17 0644  Weight: 46.3 kg    Examination:  General exam: Appears calm and comfortable  Respiratory system: Clear to auscultation. Respiratory effort normal. Cardiovascular system: S1 & S2 heard, RRR. No JVD, murmurs, rubs, gallops or clicks. No pedal edema. Gastrointestinal system: Abdomen is nondistended, soft and tender in left abdomen and epigastrium. No organomegaly or masses felt. Normal bowel sounds heard. Central nervous system: Alert and oriented. No focal neurological deficits. Extremities: Symmetric 5 x 5 power. Skin: No rashes, lesions or ulcers Psychiatry: Judgement and insight appear normal. Mood & affect appropriate.     Data Reviewed: I have personally reviewed following labs and imaging studies  CBC: Recent Labs  Lab 11/27/17 0802 11/28/17 0521  WBC 9.7 10.3  NEUTROABS 6.7 9.3*  HGB 15.7 14.0  HCT 46.3 42.8  MCV 96.9 99.8  PLT 297 275   Basic Metabolic Panel: Recent Labs  Lab 11/27/17 0802 11/28/17 0521  NA 137 136  K 3.6 3.8  CL 104 108  CO2 26 22  GLUCOSE 96 105*  BUN <5* 6  CREATININE 0.69 0.62  CALCIUM 8.5* 7.8*  MG  --  1.9   GFR: Estimated Creatinine Clearance: 73.1 mL/min (by C-G formula based on SCr of 0.62 mg/dL). Liver Function Tests: Recent Labs  Lab 11/27/17 0802 11/28/17 0521  AST 16 12*  ALT 13 10  ALKPHOS 78 75  BILITOT 0.5 0.9  PROT 6.2* 5.3*  ALBUMIN 3.2* 2.8*   Recent Labs  Lab 11/27/17 0802 11/28/17 0521  LIPASE 72* 133*   No results for input(s): AMMONIA in the last 168 hours. Coagulation Profile: No results for input(s): INR, PROTIME in the last 168 hours. Cardiac Enzymes: No results for input(s): CKTOTAL, CKMB, CKMBINDEX, TROPONINI in the last 168 hours. BNP (last 3 results) No results for  input(s): PROBNP in the last 8760 hours. HbA1C: No results for input(s): HGBA1C in the last 72 hours. CBG: No results for input(s): GLUCAP in the last 168 hours. Lipid Profile: Recent Labs    11/27/17 1144  CHOL 93  HDL 29*  LDLCALC 36  TRIG 381  CHOLHDL 3.2   Thyroid Function Tests: No results for input(s): TSH, T4TOTAL, FREET4, T3FREE, THYROIDAB in the last 72 hours. Anemia Panel: No results for input(s): VITAMINB12, FOLATE, FERRITIN, TIBC, IRON, RETICCTPCT in the last 72 hours. Sepsis Labs: No results for input(s): PROCALCITON, LATICACIDVEN in the last 168 hours.  No results found for this or any previous visit (from the past 240 hour(s)).       Radiology Studies: Dg Chest 2 View  Result Date: 11/27/2017 CLINICAL DATA:  Left lower abdominal pain EXAM: CHEST - 2 VIEW COMPARISON:  10/20/2017 FINDINGS: There is hyperinflation of the lungs compatible with COPD. Scarring in the left upper lobe, stable. No acute confluent airspace opacities or effusions. Heart is normal size. No acute bony abnormality. IMPRESSION: COPD/chronic changes.  No active disease. Electronically Signed   By: Charlett Nose M.D.   On: 11/27/2017 09:08   Ct Abdomen Pelvis W Contrast  Result Date: 11/27/2017 CLINICAL DATA:  Generalized abdominal pain, diarrhea. EXAM: CT ABDOMEN AND PELVIS WITH CONTRAST TECHNIQUE: Multidetector CT imaging of the abdomen and pelvis was performed using the standard protocol following bolus administration of intravenous contrast. CONTRAST:  ISOVUE-300 IOPAMIDOL (ISOVUE-300) INJECTION 61% COMPARISON:  CT scan of November 07, 2014. FINDINGS: Lower chest: Mild bilateral posterior basilar subsegmental atelectasis is noted. Hepatobiliary: No focal liver abnormality is seen. No gallstones, gallbladder wall thickening, or biliary dilatation. Pancreas: Mild inflammatory changes are seen around the pancreatic tail. No significant ductal dilatation is noted. No pseudocyst formation is  noted. Spleen: Normal in size without focal abnormality. Adrenals/Urinary Tract: Adrenal glands are unremarkable. Kidneys are normal, without renal calculi, focal lesion, or hydronephrosis. Bladder is unremarkable. Stomach/Bowel: Stomach is within normal limits. Appendix appears normal. No evidence of bowel wall thickening, distention, or inflammatory changes. Vascular/Lymphatic: Aortic atherosclerosis. No enlarged abdominal or pelvic lymph nodes. Reproductive: Prostate is unremarkable. Other: No abdominal wall hernia or abnormality. No abdominopelvic ascites. Musculoskeletal: Severe degenerative disc disease is noted at L4-5. Old L2 fracture is noted. No acute osseous abnormality is noted. IMPRESSION: Mild inflammatory changes are seen around pancreatic tail suggesting acute pancreatitis. No pseudocyst formation is noted. Aortic Atherosclerosis (ICD10-I70.0). Electronically Signed   By: Lupita Raider, M.D.   On: 11/27/2017 10:13   Dg Chest Port 1 View  Result Date: 11/28/2017 CLINICAL DATA:  47 y/o  M; acute pancreatitis. Shortness of breath. EXAM: PORTABLE CHEST 1 VIEW COMPARISON:  11/27/2017 chest radiograph. FINDINGS: Stable cardiac silhouette given projection and technique. Stable hyperinflation of lungs compatible  with COPD and scarring in the left upper lobe. No consolidation, effusion, or pneumothorax. No acute osseous abnormality identified. IMPRESSION: Stable findings of COPD. No acute pulmonary process identified. Electronically Signed   By: Mitzi Hansen M.D.   On: 11/28/2017 06:26        Scheduled Meds: . enoxaparin (LOVENOX) injection  40 mg Subcutaneous Q24H  . ketorolac  2 drop Both Eyes QID  . mometasone-formoterol  2 puff Inhalation BID  . nicotine  14 mg Transdermal Daily  . pantoprazole  40 mg Oral Daily  . prednisoLONE acetate  2 drop Both Eyes BID  . [START ON 11/29/2017] predniSONE  5 mg Oral Q breakfast   Continuous Infusions: . sodium chloride Stopped  (11/28/17 0742)     LOS: 1 day    Time spent: 35 minutes    Erick Blinks, MD [facility] Pager [phone]  If 7PM-7AM, please contact night-coverage [email or web address] Password [email or web address] 11/28/2017, 1:55 PM

## 2017-11-29 LAB — CBC WITH DIFFERENTIAL/PLATELET
ABS IMMATURE GRANULOCYTES: 0.02 10*3/uL (ref 0.00–0.07)
BASOS PCT: 1 %
Basophils Absolute: 0 10*3/uL (ref 0.0–0.1)
Eosinophils Absolute: 0.1 10*3/uL (ref 0.0–0.5)
Eosinophils Relative: 1 %
HEMATOCRIT: 40.5 % (ref 39.0–52.0)
HEMOGLOBIN: 13.2 g/dL (ref 13.0–17.0)
IMMATURE GRANULOCYTES: 0 %
LYMPHS ABS: 1.9 10*3/uL (ref 0.7–4.0)
LYMPHS PCT: 24 %
MCH: 32.5 pg (ref 26.0–34.0)
MCHC: 32.6 g/dL (ref 30.0–36.0)
MCV: 99.8 fL (ref 80.0–100.0)
MONO ABS: 0.7 10*3/uL (ref 0.1–1.0)
MONOS PCT: 9 %
NEUTROS ABS: 5.1 10*3/uL (ref 1.7–7.7)
Neutrophils Relative %: 65 %
Platelets: 267 10*3/uL (ref 150–400)
RBC: 4.06 MIL/uL — ABNORMAL LOW (ref 4.22–5.81)
RDW: 12.9 % (ref 11.5–15.5)
WBC: 7.8 10*3/uL (ref 4.0–10.5)
nRBC: 0 % (ref 0.0–0.2)

## 2017-11-29 LAB — COMPREHENSIVE METABOLIC PANEL
ALBUMIN: 2.5 g/dL — AB (ref 3.5–5.0)
ALT: 8 U/L (ref 0–44)
AST: 13 U/L — ABNORMAL LOW (ref 15–41)
Alkaline Phosphatase: 60 U/L (ref 38–126)
Anion gap: 5 (ref 5–15)
BILIRUBIN TOTAL: 0.9 mg/dL (ref 0.3–1.2)
BUN: 5 mg/dL — AB (ref 6–20)
CO2: 25 mmol/L (ref 22–32)
Calcium: 7.7 mg/dL — ABNORMAL LOW (ref 8.9–10.3)
Chloride: 109 mmol/L (ref 98–111)
Creatinine, Ser: 0.61 mg/dL (ref 0.61–1.24)
GFR calc Af Amer: >60 mL/min (ref >60)
GFR calc non Af Amer: >60 mL/min (ref >60)
GLUCOSE: 80 mg/dL (ref 70–99)
POTASSIUM: 3 mmol/L — AB (ref 3.5–5.1)
Sodium: 139 mmol/L (ref 135–145)
TOTAL PROTEIN: 4.7 g/dL — AB (ref 6.5–8.1)

## 2017-11-29 LAB — LIPASE, BLOOD: LIPASE: 66 U/L — AB (ref 11–51)

## 2017-11-29 LAB — HIV ANTIBODY (ROUTINE TESTING W REFLEX): HIV Screen 4th Generation wRfx: NONREACTIVE

## 2017-11-29 MED ORDER — PANCRELIPASE (LIP-PROT-AMYL) 12000-38000 UNITS PO CPEP
24000.0000 [IU] | ORAL_CAPSULE | Freq: Three times a day (TID) | ORAL | Status: DC
  Administered 2017-11-30: 24000 [IU] via ORAL
  Filled 2017-11-29: qty 2

## 2017-11-29 NOTE — Progress Notes (Signed)
PROGRESS NOTE    Brent [name]  ZOX:[id number] DOB: 1970-06-29 DOA: 11/27/2017 PCP: Gareth Morgan, MD    Brief Narrative:  47 year old male with a history of alcoholic pancreatitis, presented to the hospital with abdominal pain.  Found to have acute pancreatitis.  Admitted for IV fluids, pain management and bowel rest.   Assessment & Plan:   Principal Problem:   Acute on chronic pancreatitis (HCC) Active Problems:   ETOH abuse   Chronic back pain   Anxiety   Tobacco abuse   Pancreatitis, alcoholic, acute   Acute pancreatitis   Loose stools   MS (multiple sclerosis) (HCC)   1. Acute on chronic pancreatitis.  Clinically, he appears to be improving.  Abdominal pain is better today.  Lipase has trended down.  We will try and clear liquids.  Continue current pain management.  If he tolerates liquids, can likely transition to oral pain medicines.  No signs of gallstones or gallbladder wall thickening on imaging.  Triglycerides are normal range. 2. Chronic alcoholism.  Patient reports that he was drinking in the past but has not for quite some time now.  Ethanol level is negative. 3. History of multiple sclerosis.  He is on daily prednisone.  Continue on the same. 4. Chronic back pain.  He is on chronic opiates. 5. Anxiety.  Continue on as needed Xanax   DVT prophylaxis: Lovenox Code Status: Full code  Family Communication: No family present Disposition Plan: Discharge home once able to tolerate p.o.   Consultants:     Procedures:     Antimicrobials:      Subjective: Reports that abdominal pain is doing better.  No further nausea or vomiting.  Still on IV pain medicine.  Wants to try some liquids  Objective: Vitals:   11/28/17 2250 11/29/17 0625 11/29/17 0726 11/29/17 1500  BP: 109/74 97/73  114/83  Pulse: 77 (!) 56  (!) 58  Resp: 18 15  15   Temp: 98.9 F (37.2 C) 98.6 F (37 C)  98.7 F (37.1 C)  TempSrc: Oral Oral  Oral  SpO2: 97% 97% 98% 97%    Weight:      Height:        Intake/Output Summary (Last 24 hours) at 11/29/2017 1835 Last data filed at 11/29/2017 1810 Gross per 24 hour  Intake 480 ml  Output 2 ml  Net 478 ml   Filed Weights   11/27/17 0644  Weight: 46.3 kg    Examination:  General exam: Alert, awake, oriented x 3 Respiratory system: Clear to auscultation. Respiratory effort normal. Cardiovascular system:RRR. No murmurs, rubs, gallops. Gastrointestinal system: Abdomen is nondistended, soft and tender on the left side. No organomegaly or masses felt. Normal bowel sounds heard. Central nervous system: Alert and oriented. No focal neurological deficits. Extremities: No C/C/E, +pedal pulses Skin: No rashes, lesions or ulcers Psychiatry: Judgement and insight appear normal. Mood & affect appropriate.      Data Reviewed: I have personally reviewed following labs and imaging studies  CBC: Recent Labs  Lab 11/27/17 0802 11/28/17 0521 11/29/17 0749  WBC 9.7 10.3 7.8  NEUTROABS 6.7 9.3* 5.1  HGB 15.7 14.0 13.2  HCT 46.3 42.8 40.5  MCV 96.9 99.8 99.8  PLT 297 275 267   Basic Metabolic Panel: Recent Labs  Lab 11/27/17 0802 11/28/17 0521 11/29/17 0749  NA 137 136 139  K 3.6 3.8 3.0*  CL 104 108 109  CO2 26 22 25   GLUCOSE 96 105* 80  BUN <5* 6  5*  CREATININE 0.69 0.62 0.61  CALCIUM 8.5* 7.8* 7.7*  MG  --  1.9  --    GFR: Estimated Creatinine Clearance: 73.1 mL/min (by C-G formula based on SCr of 0.61 mg/dL). Liver Function Tests: Recent Labs  Lab 11/27/17 0802 11/28/17 0521 11/29/17 0749  AST 16 12* 13*  ALT 13 10 8   ALKPHOS 78 75 60  BILITOT 0.5 0.9 0.9  PROT 6.2* 5.3* 4.7*  ALBUMIN 3.2* 2.8* 2.5*   Recent Labs  Lab 11/27/17 0802 11/28/17 0521 11/29/17 0749  LIPASE 72* 133* 66*   No results for input(s): AMMONIA in the last 168 hours. Coagulation Profile: No results for input(s): INR, PROTIME in the last 168 hours. Cardiac Enzymes: No results for input(s): CKTOTAL, CKMB,  CKMBINDEX, TROPONINI in the last 168 hours. BNP (last 3 results) No results for input(s): PROBNP in the last 8760 hours. HbA1C: No results for input(s): HGBA1C in the last 72 hours. CBG: No results for input(s): GLUCAP in the last 168 hours. Lipid Profile: Recent Labs    11/27/17 1144  CHOL 93  HDL 29*  LDLCALC 36  TRIG 570  CHOLHDL 3.2   Thyroid Function Tests: No results for input(s): TSH, T4TOTAL, FREET4, T3FREE, THYROIDAB in the last 72 hours. Anemia Panel: No results for input(s): VITAMINB12, FOLATE, FERRITIN, TIBC, IRON, RETICCTPCT in the last 72 hours. Sepsis Labs: No results for input(s): PROCALCITON, LATICACIDVEN in the last 168 hours.  No results found for this or any previous visit (from the past 240 hour(s)).       Radiology Studies: Dg Chest Port 1 View  Result Date: 11/28/2017 CLINICAL DATA:  47 y/o  M; acute pancreatitis. Shortness of breath. EXAM: PORTABLE CHEST 1 VIEW COMPARISON:  11/27/2017 chest radiograph. FINDINGS: Stable cardiac silhouette given projection and technique. Stable hyperinflation of lungs compatible with COPD and scarring in the left upper lobe. No consolidation, effusion, or pneumothorax. No acute osseous abnormality identified. IMPRESSION: Stable findings of COPD. No acute pulmonary process identified. Electronically Signed   By: Mitzi Hansen M.D.   On: 11/28/2017 06:26        Scheduled Meds: . enoxaparin (LOVENOX) injection  40 mg Subcutaneous Q24H  . ketorolac  2 drop Both Eyes QID  . [START ON 11/30/2017] lipase/protease/amylase  24,000 Units Oral TID WC  . mometasone-formoterol  2 puff Inhalation BID  . nicotine  14 mg Transdermal Daily  . pantoprazole  40 mg Oral Daily  . prednisoLONE acetate  2 drop Both Eyes BID  . predniSONE  5 mg Oral Q breakfast   Continuous Infusions: . sodium chloride 150 mL/hr at 11/29/17 1715     LOS: 2 days    Time spent: 30 minutes    Erick Blinks, MD [facility] Pager [phone]  If 7PM-7AM, please contact night-coverage [email or web address] Password [email or web address] 11/29/2017, 6:35 PM

## 2017-11-30 LAB — COMPREHENSIVE METABOLIC PANEL
ALT: 10 U/L (ref 0–44)
ANION GAP: 4 — AB (ref 5–15)
AST: 17 U/L (ref 15–41)
Albumin: 2.8 g/dL — ABNORMAL LOW (ref 3.5–5.0)
Alkaline Phosphatase: 68 U/L (ref 38–126)
BUN: <5 mg/dL — ABNORMAL LOW (ref 6–20)
CALCIUM: 8 mg/dL — AB (ref 8.9–10.3)
CO2: 27 mmol/L (ref 22–32)
CREATININE: 0.65 mg/dL (ref 0.61–1.24)
Chloride: 106 mmol/L (ref 98–111)
GFR calc non Af Amer: >60 mL/min (ref >60)
Glucose, Bld: 89 mg/dL (ref 70–99)
Potassium: 2.9 mmol/L — ABNORMAL LOW (ref 3.5–5.1)
SODIUM: 137 mmol/L (ref 135–145)
TOTAL PROTEIN: 5.3 g/dL — AB (ref 6.5–8.1)
Total Bilirubin: 0.4 mg/dL (ref 0.3–1.2)

## 2017-11-30 MED ORDER — ALPRAZOLAM 1 MG PO TABS
1.0000 mg | ORAL_TABLET | Freq: Three times a day (TID) | ORAL | Status: DC | PRN
  Administered 2017-11-30: 1 mg via ORAL
  Filled 2017-11-30: qty 1

## 2017-11-30 MED ORDER — HYDROCODONE-ACETAMINOPHEN 10-325 MG PO TABS
1.0000 | ORAL_TABLET | Freq: Four times a day (QID) | ORAL | Status: DC | PRN
  Administered 2017-11-30: 1 via ORAL
  Filled 2017-11-30: qty 1

## 2017-11-30 NOTE — Progress Notes (Signed)
Pt rinsed after [facility]

## 2017-11-30 NOTE — Discharge Summary (Signed)
Physician Discharge Summary  Brent [name] ZOX:[id number] DOB: [birth year] DOA: 11/27/2017  PCP: Gareth Morgan, MD  Admit date: 11/27/2017 Discharge date: 11/30/2017  Admitted From: Home Disposition: Home  Recommendations for Outpatient Follow-up:  1. Follow up with PCP in 1-2 weeks 2. Please obtain BMP/CBC in one week  Discharge Condition: Stable CODE STATUS: Full code Diet recommendation: Heart healthy  Brief/Interim Summary: 47 year old male with a history of alcoholic pancreatitis, multiple sclerosis, chronic pancreatitis, presents to the hospital with complaints of abdominal pain.  Found to have acute on chronic pancreatitis.  He was admitted for IV fluids, pain management and bowel rest.  He was initially kept n.p.o. and his lipase did trend down.  He was slowly advanced to solid food and appears to be tolerating this well.  Abdominal pain has resolved.  He does not have any vomiting.  He is continued on Creon.  Imaging did not indicate any evidence of gallstones or gallbladder wall thickening.  Triglycerides were normal.  It is possible that his pancreatitis may be related to alcohol.  He is been advised to refrain from any further alcohol drinking.  He was continued on chronic prednisone for multiple sclerosis.  Regarding his anxiety, he is continued on as needed Xanax.  Patient appears to be back to baseline level of function and feels stable for discharge home.  He has been advised to follow-up with his primary care physician in 1 to 2 weeks.  Discharge Diagnoses:  Principal Problem:   Acute on chronic pancreatitis (HCC) Active Problems:   ETOH abuse   Chronic back pain   Anxiety   Tobacco abuse   Pancreatitis, alcoholic, acute   Acute pancreatitis   Loose stools   MS (multiple sclerosis) (HCC)    Discharge Instructions  Discharge Instructions    Diet - low sodium heart healthy   Complete by:  As directed    Increase activity slowly   Complete by:  As directed       Allergies as of 11/30/2017   No Known Allergies     Medication List    STOP taking these medications   BC FAST PAIN RELIEF 845-65 MG Pack Generic drug:  Aspirin-Caffeine     TAKE these medications   ALPRAZolam 1 MG tablet Commonly known as:  XANAX Take 1 mg by mouth 4 (four) times daily.   CREON 3000-9500 units Cpep Generic drug:  Pancrelipase (Lip-Prot-Amyl) Take 1-2 capsules by mouth See admin instructions. 2 capsules with each meal and 1 capsule with each snack.   [employer] VITAMIN B12 2000 MCG tablet Generic drug:  cyanocobalamin Take 2,000 mcg by mouth daily.   HYDROcodone-acetaminophen 10-325 MG tablet Commonly known as:  NORCO Take 1 tablet by mouth 2 (two) times daily.   ketorolac 0.4 % Soln Commonly known as:  ACULAR Place 2 drops into both eyes 4 (four) times daily.   omeprazole 40 MG capsule Commonly known as:  PRILOSEC Take 40 mg by mouth at bedtime.   prednisoLONE acetate 1 % ophthalmic suspension Commonly known as:  PRED FORTE Place 2 drops into both eyes 2 (two) times daily.   predniSONE 5 MG tablet Commonly known as:  DELTASONE Take 5 mg by mouth daily.   SYMBICORT 80-4.5 MCG/ACT inhaler Generic drug:  budesonide-formoterol Inhale 1 puff into the lungs every 4 (four) hours.   zolpidem 10 MG tablet Commonly known as:  AMBIEN Take 10 mg by mouth at bedtime.      Follow-up Information    Sudie Bailey,  Brett Canales, MD. Schedule an appointment as soon as possible for a visit in 1 week(s).   Specialty:  Family Medicine Contact information: [address] [place] [address] [phone]          No Known Allergies  Consultations:     Procedures/Studies: Dg Chest 2 View  Result Date: 11/27/2017 CLINICAL DATA:  Left lower abdominal pain EXAM: CHEST - 2 VIEW COMPARISON:  10/20/2017 FINDINGS: There is hyperinflation of the lungs compatible with COPD. Scarring in the left upper lobe, stable. No acute confluent airspace opacities or  effusions. Heart is normal size. No acute bony abnormality. IMPRESSION: COPD/chronic changes.  No active disease. Electronically Signed   By: Charlett Nose M.D.   On: 11/27/2017 09:08   Ct Abdomen Pelvis W Contrast  Result Date: 11/27/2017 CLINICAL DATA:  Generalized abdominal pain, diarrhea. EXAM: CT ABDOMEN AND PELVIS WITH CONTRAST TECHNIQUE: Multidetector CT imaging of the abdomen and pelvis was performed using the standard protocol following bolus administration of intravenous contrast. CONTRAST:  ISOVUE-300 IOPAMIDOL (ISOVUE-300) INJECTION 61% COMPARISON:  CT scan of November 07, 2014. FINDINGS: Lower chest: Mild bilateral posterior basilar subsegmental atelectasis is noted. Hepatobiliary: No focal liver abnormality is seen. No gallstones, gallbladder wall thickening, or biliary dilatation. Pancreas: Mild inflammatory changes are seen around the pancreatic tail. No significant ductal dilatation is noted. No pseudocyst formation is noted. Spleen: Normal in size without focal abnormality. Adrenals/Urinary Tract: Adrenal glands are unremarkable. Kidneys are normal, without renal calculi, focal lesion, or hydronephrosis. Bladder is unremarkable. Stomach/Bowel: Stomach is within normal limits. Appendix appears normal. No evidence of bowel wall thickening, distention, or inflammatory changes. Vascular/Lymphatic: Aortic atherosclerosis. No enlarged abdominal or pelvic lymph nodes. Reproductive: Prostate is unremarkable. Other: No abdominal wall hernia or abnormality. No abdominopelvic ascites. Musculoskeletal: Severe degenerative disc disease is noted at L4-5. Old L2 fracture is noted. No acute osseous abnormality is noted. IMPRESSION: Mild inflammatory changes are seen around pancreatic tail suggesting acute pancreatitis. No pseudocyst formation is noted. Aortic Atherosclerosis (ICD10-I70.0). Electronically Signed   By: Lupita Raider, M.D.   On: 11/27/2017 10:13   Dg Chest Port 1 View  Result Date:  11/28/2017 CLINICAL DATA:  47 y/o  M; acute pancreatitis. Shortness of breath. EXAM: PORTABLE CHEST 1 VIEW COMPARISON:  11/27/2017 chest radiograph. FINDINGS: Stable cardiac silhouette given projection and technique. Stable hyperinflation of lungs compatible with COPD and scarring in the left upper lobe. No consolidation, effusion, or pneumothorax. No acute osseous abnormality identified. IMPRESSION: Stable findings of COPD. No acute pulmonary process identified. Electronically Signed   By: Mitzi Hansen M.D.   On: 11/28/2017 06:26       Subjective: Feeling better, tolerating solid foods, no vomiting. No abdominal pain  Discharge Exam: Vitals:   11/29/17 2028 11/30/17 0630 11/30/17 0721 11/30/17 1512  BP: 114/69 117/75  102/78  Pulse: (!) 58 (!) 58  (!) 57  Resp: 16   16  Temp: 99.1 F (37.3 C) 98.9 F (37.2 C)  98.6 F (37 C)  TempSrc: Oral   Oral  SpO2: 99% 96% 95% 99%  Weight:      Height:        General: Pt is alert, awake, not in acute distress Cardiovascular: RRR, S1/S2 +, no rubs, no gallops Respiratory: CTA bilaterally, no wheezing, no rhonchi Abdominal: Soft, NT, ND, bowel sounds + Extremities: no edema, no cyanosis    The results of significant diagnostics from this hospitalization (including imaging, microbiology, ancillary and laboratory) are listed below  for reference.     Microbiology: No results found for this or any previous visit (from the past 240 hour(s)).   Labs: BNP (last 3 results) No results for input(s): BNP in the last 8760 hours. Basic Metabolic Panel: Recent Labs  Lab 11/27/17 0802 11/28/17 0521 11/29/17 0749 11/30/17 0700  NA 137 136 139 137  K 3.6 3.8 3.0* 2.9*  CL 104 108 109 106  CO2 26 22 25 27   GLUCOSE 96 105* 80 89  BUN <5* 6 5* <5*  CREATININE 0.69 0.62 0.61 0.65  CALCIUM 8.5* 7.8* 7.7* 8.0*  MG  --  1.9  --   --    Liver Function Tests: Recent Labs  Lab 11/27/17 0802 11/28/17 0521 11/29/17 0749  11/30/17 0700  AST 16 12* 13* 17  ALT 13 10 8 10   ALKPHOS 78 75 60 68  BILITOT 0.5 0.9 0.9 0.4  PROT 6.2* 5.3* 4.7* 5.3*  ALBUMIN 3.2* 2.8* 2.5* 2.8*   Recent Labs  Lab 11/27/17 0802 11/28/17 0521 11/29/17 0749  LIPASE 72* 133* 66*   No results for input(s): AMMONIA in the last 168 hours. CBC: Recent Labs  Lab 11/27/17 0802 11/28/17 0521 11/29/17 0749  WBC 9.7 10.3 7.8  NEUTROABS 6.7 9.3* 5.1  HGB 15.7 14.0 13.2  HCT 46.3 42.8 40.5  MCV 96.9 99.8 99.8  PLT 297 275 267   Cardiac Enzymes: No results for input(s): CKTOTAL, CKMB, CKMBINDEX, TROPONINI in the last 168 hours. BNP: Invalid input(s): POCBNP CBG: No results for input(s): GLUCAP in the last 168 hours. D-Dimer No results for input(s): DDIMER in the last 72 hours. Hgb A1c No results for input(s): HGBA1C in the last 72 hours. Lipid Profile No results for input(s): CHOL, HDL, LDLCALC, TRIG, CHOLHDL, LDLDIRECT in the last 72 hours. Thyroid function studies No results for input(s): TSH, T4TOTAL, T3FREE, THYROIDAB in the last 72 hours.  Invalid input(s): FREET3 Anemia work up No results for input(s): VITAMINB12, FOLATE, FERRITIN, TIBC, IRON, RETICCTPCT in the last 72 hours. Urinalysis    Component Value Date/Time   COLORURINE STRAW (A) 11/27/2017 1504   APPEARANCEUR CLEAR 11/27/2017 1504   LABSPEC 1.028 11/27/2017 1504   PHURINE 7.0 11/27/2017 1504   GLUCOSEU NEGATIVE 11/27/2017 1504   HGBUR NEGATIVE 11/27/2017 1504   BILIRUBINUR NEGATIVE 11/27/2017 1504   KETONESUR NEGATIVE 11/27/2017 1504   PROTEINUR NEGATIVE 11/27/2017 1504   UROBILINOGEN 0.2 11/06/2014 2345   NITRITE NEGATIVE 11/27/2017 1504   LEUKOCYTESUR NEGATIVE 11/27/2017 1504   Sepsis Labs Invalid input(s): PROCALCITONIN,  WBC,  LACTICIDVEN Microbiology No results found for this or any previous visit (from the past 240 hour(s)).   Time coordinating discharge:  SIGNED:   Erick Blinks, MD  [facility] 11/30/2017, 4:27  PM Pager   If 7PM-7AM, please contact night-coverage [email or web address] Password [email or web address]

## 2017-11-30 NOTE — Progress Notes (Signed)
Pt discharged home today per Dr. Memon. Pt's IV site D/C'd and WDL. Pt's VSS. Pt provided with home medication list, discharge instructions and prescriptions. Verbalized understanding. Pt left floor via WC in stable condition accompanied by NT.  

## 2018-01-26 NOTE — Progress Notes (Signed)
Due to multiple No Shows and rescheduled appointments, called patient to verify that he would be able to make his appointment at 10 am tomorrow.

## 2018-01-27 ENCOUNTER — Encounter (HOSPITAL_COMMUNITY)
Admission: RE | Admit: 2018-01-27 | Discharge: 2018-01-27 | Disposition: A | Discharge status: Home / Self Care | Payer: Medicare Other | Source: Ambulatory Visit | Attending: Neurology | Admitting: Neurology

## 2018-01-27 NOTE — Progress Notes (Signed)
Pt notified no tysabri med in hospital at this time. Will have drug available after 10:00. Stated he would come at 1200. Voiced understanding.

## 2018-01-30 ENCOUNTER — Encounter (HOSPITAL_COMMUNITY): Payer: Self-pay

## 2018-01-30 ENCOUNTER — Ambulatory Visit (HOSPITAL_COMMUNITY)
Admission: RE | Admit: 2018-01-30 | Discharge: 2018-01-30 | Disposition: A | Discharge status: Home / Self Care | Payer: Medicare Other | Source: Ambulatory Visit | Attending: Neurology | Admitting: Neurology

## 2018-01-30 ENCOUNTER — Encounter (HOSPITAL_COMMUNITY): Admission: RE | Admit: 2018-01-30 | Payer: Medicare Other | Source: Ambulatory Visit

## 2018-01-30 DIAGNOSIS — G35 Multiple sclerosis: Secondary | ICD-10-CM | POA: Insufficient documentation

## 2018-01-30 MED ORDER — SODIUM CHLORIDE 0.9 % IV SOLN
Freq: Once | INTRAVENOUS | Status: AC
  Administered 2018-01-30: 12:00:00 via INTRAVENOUS

## 2018-01-30 MED ORDER — LORATADINE 10 MG PO TABS
10.0000 mg | ORAL_TABLET | Freq: Once | ORAL | Status: AC
  Administered 2018-01-30: 10 mg via ORAL
  Filled 2018-01-30: qty 1

## 2018-01-30 MED ORDER — SODIUM CHLORIDE 0.9 % IV SOLN
300.0000 mg | Freq: Once | INTRAVENOUS | Status: AC
  Administered 2018-01-30: 300 mg via INTRAVENOUS
  Filled 2018-01-30: qty 15

## 2018-01-30 MED ORDER — ACETAMINOPHEN 500 MG PO TABS
1000.0000 mg | ORAL_TABLET | Freq: Once | ORAL | Status: AC
  Administered 2018-01-30: 1000 mg via ORAL
  Filled 2018-01-30: qty 2

## 2018-02-23 ENCOUNTER — Inpatient Hospital Stay (HOSPITAL_COMMUNITY)
Admission: EM | Admit: 2018-02-23 | Discharge: 2018-02-25 | DRG: 438 | Disposition: A | Discharge status: Other Acute Inpatient Hospital | Payer: Medicare Other | Attending: Internal Medicine | Admitting: Internal Medicine

## 2018-02-23 ENCOUNTER — Emergency Department (HOSPITAL_COMMUNITY): Payer: Medicare Other

## 2018-02-23 ENCOUNTER — Other Ambulatory Visit: Payer: Self-pay

## 2018-02-23 ENCOUNTER — Encounter (HOSPITAL_COMMUNITY): Payer: Self-pay

## 2018-02-23 DIAGNOSIS — K859 Acute pancreatitis without necrosis or infection, unspecified: Principal | ICD-10-CM | POA: Diagnosis present

## 2018-02-23 DIAGNOSIS — E871 Hypo-osmolality and hyponatremia: Secondary | ICD-10-CM | POA: Diagnosis present

## 2018-02-23 DIAGNOSIS — E43 Unspecified severe protein-calorie malnutrition: Secondary | ICD-10-CM | POA: Diagnosis present

## 2018-02-23 DIAGNOSIS — K315 Obstruction of duodenum: Secondary | ICD-10-CM | POA: Diagnosis present

## 2018-02-23 DIAGNOSIS — K5900 Constipation, unspecified: Secondary | ICD-10-CM

## 2018-02-23 DIAGNOSIS — M797 Fibromyalgia: Secondary | ICD-10-CM | POA: Diagnosis present

## 2018-02-23 DIAGNOSIS — E876 Hypokalemia: Secondary | ICD-10-CM

## 2018-02-23 DIAGNOSIS — I10 Essential (primary) hypertension: Secondary | ICD-10-CM | POA: Diagnosis present

## 2018-02-23 DIAGNOSIS — Z79899 Other long term (current) drug therapy: Secondary | ICD-10-CM

## 2018-02-23 DIAGNOSIS — K863 Pseudocyst of pancreas: Secondary | ICD-10-CM | POA: Diagnosis present

## 2018-02-23 DIAGNOSIS — F410 Panic disorder [episodic paroxysmal anxiety] without agoraphobia: Secondary | ICD-10-CM | POA: Diagnosis present

## 2018-02-23 DIAGNOSIS — K219 Gastro-esophageal reflux disease without esophagitis: Secondary | ICD-10-CM | POA: Diagnosis present

## 2018-02-23 DIAGNOSIS — R9431 Abnormal electrocardiogram [ECG] [EKG]: Secondary | ICD-10-CM | POA: Diagnosis present

## 2018-02-23 DIAGNOSIS — F17210 Nicotine dependence, cigarettes, uncomplicated: Secondary | ICD-10-CM | POA: Diagnosis present

## 2018-02-23 DIAGNOSIS — R7303 Prediabetes: Secondary | ICD-10-CM | POA: Diagnosis present

## 2018-02-23 DIAGNOSIS — G35 Multiple sclerosis: Secondary | ICD-10-CM | POA: Diagnosis present

## 2018-02-23 DIAGNOSIS — Z681 Body mass index (BMI) 19 or less, adult: Secondary | ICD-10-CM

## 2018-02-23 DIAGNOSIS — K861 Other chronic pancreatitis: Secondary | ICD-10-CM | POA: Diagnosis present

## 2018-02-23 LAB — COMPREHENSIVE METABOLIC PANEL
ALK PHOS: 77 U/L (ref 38–126)
ALT: 10 U/L (ref 0–44)
AST: 14 U/L — AB (ref 15–41)
Albumin: 2.9 g/dL — ABNORMAL LOW (ref 3.5–5.0)
Anion gap: 11 (ref 5–15)
BUN: 11 mg/dL (ref 6–20)
CALCIUM: 7.9 mg/dL — AB (ref 8.9–10.3)
CHLORIDE: 93 mmol/L — AB (ref 98–111)
CO2: 27 mmol/L (ref 22–32)
CREATININE: 0.52 mg/dL — AB (ref 0.61–1.24)
Glucose, Bld: 97 mg/dL (ref 70–99)
Potassium: 2.6 mmol/L — CL (ref 3.5–5.1)
Sodium: 131 mmol/L — ABNORMAL LOW (ref 135–145)
Total Bilirubin: 0.8 mg/dL (ref 0.3–1.2)
Total Protein: 6.7 g/dL (ref 6.5–8.1)

## 2018-02-23 LAB — CBC
HEMATOCRIT: 34.7 % — AB (ref 39.0–52.0)
Hemoglobin: 11.2 g/dL — ABNORMAL LOW (ref 13.0–17.0)
MCH: 29.6 pg (ref 26.0–34.0)
MCHC: 32.3 g/dL (ref 30.0–36.0)
MCV: 91.6 fL (ref 80.0–100.0)
PLATELETS: 621 10*3/uL — AB (ref 150–400)
RBC: 3.79 MIL/uL — ABNORMAL LOW (ref 4.22–5.81)
RDW: 13.4 % (ref 11.5–15.5)
WBC: 11.6 10*3/uL — AB (ref 4.0–10.5)
nRBC: 0.3 % — ABNORMAL HIGH (ref 0.0–0.2)

## 2018-02-23 LAB — LIPASE, BLOOD: LIPASE: 363 U/L — AB (ref 11–51)

## 2018-02-23 LAB — POC OCCULT BLOOD, ED: Fecal Occult Bld: NEGATIVE

## 2018-02-23 MED ORDER — SODIUM CHLORIDE 0.9 % IV BOLUS
1000.0000 mL | Freq: Once | INTRAVENOUS | Status: AC
  Administered 2018-02-23: 1000 mL via INTRAVENOUS

## 2018-02-23 MED ORDER — POTASSIUM CHLORIDE 10 MEQ/100ML IV SOLN
10.0000 meq | INTRAVENOUS | Status: AC
  Administered 2018-02-23 – 2018-02-24 (×4): 10 meq via INTRAVENOUS
  Filled 2018-02-23 (×4): qty 100

## 2018-02-23 MED ORDER — IOHEXOL 300 MG/ML  SOLN
100.0000 mL | Freq: Once | INTRAMUSCULAR | Status: AC | PRN
  Administered 2018-02-23: 100 mL via INTRAVENOUS

## 2018-02-23 MED ORDER — ONDANSETRON HCL 4 MG/2ML IJ SOLN
4.0000 mg | Freq: Once | INTRAMUSCULAR | Status: AC
  Administered 2018-02-23: 4 mg via INTRAVENOUS
  Filled 2018-02-23: qty 2

## 2018-02-23 MED ORDER — MORPHINE SULFATE (PF) 4 MG/ML IV SOLN
4.0000 mg | Freq: Once | INTRAVENOUS | Status: AC
  Administered 2018-02-23: 4 mg via INTRAVENOUS
  Filled 2018-02-23: qty 1

## 2018-02-23 NOTE — ED Provider Notes (Addendum)
[facility] EMERGENCY DEPARTMENT Provider Note   CSN: [id number] Arrival date & time: 02/23/18  1807     History   Chief Complaint Chief Complaint  Patient presents with  . Abdominal Pain    HPI Brent [name] is a 48 y.o. male.  He has a history of MS and also pancreatitis.  He is on chronic narcotics for back pain.  He is complaining of weight loss 20 pounds over the past month constipation over the past few weeks.  He has had some nausea and vomiting and has some abdominal pain and fullness.  He tried some laxatives and enema without relief.  He lives at home.  He denies any fevers or chills.  His last admission here was about a month ago.  He says he has not drank in the last 6 weeks.  The history is provided by the patient.  Abdominal Pain  Pain location:  Generalized Pain quality: aching and cramping   Pain radiates to:  Does not radiate Pain severity:  Moderate Onset quality:  Gradual Timing:  Constant Progression:  Worsening Chronicity:  New Context: laxative use   Context: not recent travel and not trauma   Relieved by:  Nothing Worsened by:  Nothing Ineffective treatments:  None tried Associated symptoms: constipation, nausea and vomiting   Associated symptoms: no chest pain, no chills, no cough, no dysuria, no fever, no shortness of breath and no sore throat   Risk factors: recent hospitalization     Past Medical History:  Diagnosis Date  . Alcohol use   . Anxiety    Disabled due to panic attacks  . Arthritis   . Bilateral ureteral calculi   . Borderline type 2 diabetes mellitus   . BPH (benign prostatic hypertrophy)   . Chronic back pain   . Condylomata acuminata in male    multiple procedures --  penile, peri-rectal , perineum  . DDD (degenerative disc disease), cervical   . Depression   . Dyspnea on exertion   . Emphysematous COPD (HCC)   . Epiretinal membrane (ERM) of both eyes   . Fibromyalgia   . GERD (gastroesophageal reflux disease)   .  Headache(784.0)   . History of chronic pancreatitis    severeal adx for this /  2008  dx alcoholic pancreatitis  . History of esophageal dilatation   . History of hiatal hernia   . History of kidney stones   . History of panic attacks   . History of sepsis    adx 05-01-2014--  urosepsis due to kidney stones obstruction/ hydronephrosis  . History of suicidal ideation    2006   adx  . Hypertension    was on medication for short time, htn was caused by prednisone per pt  . Multiple sclerosis (HCC)    pt. states has 4 small brain lesions  . Nephrolithiasis    bilateral   . Retinal vasculitis   . Uveitis     Patient Active Problem List   Diagnosis Date Noted  . MS (multiple sclerosis) (HCC) 11/28/2017  . Acute pancreatitis 11/27/2017  . Loose stools 11/27/2017  . Acute on chronic pancreatitis (HCC) 11/27/2017  . Pancreatitis, alcoholic, acute 02/18/2016  . Acute alcoholic pancreatitis 11/07/2014  . Bilateral ureteral calculi   . UTI (lower urinary tract infection)   . Ureteral calculi 05/02/2014  . Ureterolithiasis 05/01/2014  . UTI (urinary tract infection) 05/01/2014  . Sepsis (HCC) 05/01/2014  . Hydronephrosis 05/01/2014  . Tobacco abuse 05/01/2014  .  Failure to thrive (0-17) 01/22/2014  . Melena 11/03/2012  . Odynophagia 11/03/2012  . Esophageal dysphagia 11/03/2012  . ETOH abuse 08/31/2011  . Chronic back pain   . Anxiety   . Weight loss, abnormal 05/04/2010  . Epigastric pain 05/04/2010  . Rectal bleeding 05/04/2010  . Rectal pain 05/04/2010    Past Surgical History:  Procedure Laterality Date  . BIOPSY  11/12/2012   Procedure: GASTRIC AND ESOPHAGEAL BIOPSIES;  Surgeon: Corbin Ade, MD;  Location: [facility];  Service: Endoscopy;;  . CATARACT EXTRACTION W/ INTRAOCULAR LENS  IMPLANT, BILATERAL    . COLONOSCOPY  10/08/2011   Jenkins:Normal colon/Anal condyloma without extension proximal to dentate line  . CYSTOSCOPY W/ URETERAL STENT PLACEMENT Bilateral 05/01/2014     Procedure: CYSTOSCOPY WITH BILATERAL RETROGRADE PYELOGRAM; BILATERAL URETERAL STENT PLACEMENT;  Surgeon: Barron Alvine, MD;  Location: [facility];  Service: Urology;  Laterality: Bilateral;  . CYSTOSCOPY W/ URETERAL STENT REMOVAL Bilateral 06/06/2014   Procedure: CYSTOSCOPY WITH STENT REMOVAL;  Surgeon: Marcine Matar, MD;  Location: [facility];  Service: Urology;  Laterality: Bilateral;  . CYSTOSCOPY WITH URETEROSCOPY  06/06/2014   Procedure: CYSTOSCOPY WITH URETEROSCOPY;  Surgeon: Marcine Matar, MD;  Location: [facility];  Service: Urology;;  . Bluford Kaufmann WITH URETEROSCOPY AND STENT PLACEMENT Bilateral 06/06/2014   Procedure: CYSTOSCOPY WITH J2 STENT EXTRACTION,,URETEROSCOPY WITH EXTRACTION OF STONES,;  Surgeon: Marcine Matar, MD;  Location: [facility];  Service: Urology;  Laterality: Bilateral;  . ELECTROCAUTERY/ DESICCATION OF CONDYLOMA LESIONS  01-15-2008  &  12-29-2009   PENIS, PERI-RECTAL , PERINUEM  . ESOPHAGOGASTRODUODENOSCOPY (EGD) WITH PROPOFOL N/A 11/12/2012   NFA:OZHYQM esophagus s/p  passage of a Maloney dilator and biopsy. Abnormal gastric mucosa-status post biopsy  . FLEXIBLE BRONCHOSCOPY N/A 08/12/2012   Procedure: FLEXIBLE BRONCHOSCOPY;  Surgeon: Fredirick Maudlin, MD;  Location: [facility];  Service: Pulmonary;  Laterality: N/A;  . MALONEY DILATION N/A 11/12/2012   Procedure: MALONEY DILATION (54mm);  Surgeon: Corbin Ade, MD;  Location: [facility];  Service: Endoscopy;  Laterality: N/A;  . MULTIPLE EXTRACTIONS WITH ALVEOLOPLASTY N/A 04/22/2014   Procedure: MULTIPLE EXTRACTIONS ( 2,3,5,6,7,8,9,10,11,13,14,15,21,28  WITH ALVEOLOPLASTY;  Surgeon: Ocie Doyne, DDS;  Location: [facility];  Service: Oral Surgery;  Laterality: N/A;  . WISDOM TOOTH EXTRACTION          Home Medications    Prior to Admission medications   Medication Sig Start Date End Date Taking? Authorizing Provider  ALPRAZolam Prudy Feeler) 1 MG tablet Take 1 mg by mouth 4  (four) times daily.    [provider]  CREON 3000-9500 units CPEP Take 1-2 capsules by mouth See admin instructions. 2 capsules with each meal and 1 capsule with each snack. 10/03/17   [provider]  cyanocobalamin ([employer] VITAMIN B12) 2000 MCG tablet Take 2,000 mcg by mouth daily.     [provider]  HYDROcodone-acetaminophen (NORCO) 10-325 MG tablet Take 1 tablet by mouth 2 (two) times daily.  01/03/16   [provider]  ketorolac (ACULAR) 0.4 % SOLN Place 2 drops into both eyes 4 (four) times daily.  06/24/14   [provider]  omeprazole (PRILOSEC) 40 MG capsule Take 40 mg by mouth at bedtime.     [provider]  prednisoLONE acetate (PRED FORTE) 1 % ophthalmic suspension Place 2 drops into both eyes 2 (two) times daily.  01/24/15   [provider]  predniSONE (DELTASONE) 5 MG tablet Take 5 mg by mouth daily. 02/22/15  [provider]  SYMBICORT 80-4.5 MCG/ACT inhaler Inhale 1 puff into the lungs every 4 (four) hours.  08/23/17   [provider]  zolpidem (AMBIEN) 10 MG tablet Take 10 mg by mouth at bedtime.    [provider]    Family History Family History  Problem Relation Age of Onset  . Hypertension Mother   . Breast cancer Mother   . Melanoma Mother   . Stroke Mother   . Lung cancer Father 1277  . Colon cancer Neg Hx   . Colitis Neg Hx   . Cirrhosis Neg Hx   . Liver disease Neg Hx   . Pancreatic cancer Neg Hx   . Pancreatitis Neg Hx   . Anesthesia problems Neg Hx   . Hypotension Neg Hx   . Malignant hyperthermia Neg Hx   . Pseudochol deficiency Neg Hx     Social History Social History   Tobacco Use  . Smoking status: Current Every Day Smoker    Packs/day: 3.00    Years: 20.00    Pack years: 60.00    Types: Cigarettes  . Smokeless tobacco: Never Used  Substance Use Topics  . Alcohol use: Yes  . Drug use: No     Allergies   Patient has no known allergies.   Review of  Systems Review of Systems  Constitutional: Positive for unexpected weight change. Negative for chills and fever.  HENT: Negative for sore throat.   Eyes: Negative for visual disturbance.  Respiratory: Negative for cough and shortness of breath.   Cardiovascular: Negative for chest pain.  Gastrointestinal: Positive for abdominal pain, constipation, nausea and vomiting. Negative for rectal pain.  Genitourinary: Negative for dysuria.  Musculoskeletal: Positive for back pain.  Skin: Negative for rash.  Neurological: Negative for headaches.     Physical Exam Updated Vital Signs BP 121/81 (BP Location: Left Arm)   Pulse 95   Temp 98.9 F (37.2 C) (Oral)   Resp 16   Wt 49 kg   SpO2 97%   BMI 16.92 kg/m   Physical Exam Vitals signs and nursing note reviewed.  Constitutional:      Appearance: He is cachectic.  HENT:     Head: Normocephalic and atraumatic.     Mouth/Throat:     Dentition: Abnormal dentition.  Eyes:     Conjunctiva/sclera: Conjunctivae normal.  Neck:     Musculoskeletal: Neck supple.  Cardiovascular:     Rate and Rhythm: Normal rate and regular rhythm.     Heart sounds: No murmur.  Pulmonary:     Effort: Pulmonary effort is normal. No respiratory distress.     Breath sounds: Normal breath sounds.  Abdominal:     Palpations: There is mass (?stool).     Tenderness: There is generalized abdominal tenderness. There is no guarding or rebound.  Musculoskeletal: Normal range of motion.        General: No tenderness or signs of injury.  Skin:    General: Skin is warm and dry.     Capillary Refill: Capillary refill takes less than 2 seconds.  Neurological:     General: No focal deficit present.     Mental Status: He is alert.     Motor: No weakness.      ED Treatments / Results  Labs (all labs ordered are listed, but only abnormal results are displayed) Labs Reviewed  LIPASE, BLOOD - Abnormal; Notable for the following components:      Result Value  Lipase 363 (*)    All other components within normal limits  COMPREHENSIVE METABOLIC PANEL - Abnormal; Notable for the following components:   Sodium 131 (*)    Potassium 2.6 (*)    Chloride 93 (*)    Creatinine, Ser 0.52 (*)    Calcium 7.9 (*)    Albumin 2.9 (*)    AST 14 (*)    All other components within normal limits  CBC - Abnormal; Notable for the following components:   WBC 11.6 (*)    RBC 3.79 (*)    Hemoglobin 11.2 (*)    HCT 34.7 (*)    Platelets 621 (*)    nRBC 0.3 (*)    All other components within normal limits  URINALYSIS, ROUTINE W REFLEX MICROSCOPIC  POC OCCULT BLOOD, ED    EKG EKG Interpretation  Date/Time:  Monday February 23 2018 21:49:52 EST Ventricular Rate:  97 PR Interval:    QRS Duration: 103 QT Interval:  401 QTC Calculation: 510 R Axis:   88 Text Interpretation:  Sinus rhythm Anterior infarct, old Minimal ST elevation, inferior leads Prolonged QT interval Baseline wander in lead(s) V1 V3 t wave changes since prior ECG Confirmed by Linwood Dibbles [phone]) on 02/24/2018 1:07:59 PM   Radiology Ct Abdomen Pelvis W Contrast  Result Date: 02/23/2018 CLINICAL DATA:  Ongoing abdominal pain. Nausea and vomiting. Recent admissions for the same thing. EXAM: CT ABDOMEN AND PELVIS WITH CONTRAST TECHNIQUE: Multidetector CT imaging of the abdomen and pelvis was performed using the standard protocol following bolus administration of intravenous contrast. CONTRAST:  OMNIPAQUE IOHEXOL 300 MG/ML  SOLN COMPARISON:  02/05/2018 FINDINGS: Lower chest: Atelectasis or infiltration in the lung bases, improving since previous study. Hepatobiliary: No focal liver abnormality is seen. No gallstones, gallbladder wall thickening, or biliary dilatation. Pancreas: Hypoenhancing pancreatic parenchyma in the body and tail with pancreatic ductal dilatation and small cyst in the tail. These findings were present previously and are consistent with pancreatitis. There is persistent edema and  stranding around the pancreas. Small pseudo cysts which were seen previously in the left upper quadrant have demonstrated marked enlargement since the previous study. Left upper quadrant and left abdominal pseudo cyst now measures 18.3 x 15.9 x 9.6 cm. The pseudocyst compresses the stomach and abdominal organs. Additional smaller cystic collections demonstrated anterior to the pancreatic body and tail. Splenic veins appear patent. Spleen: Normal in size without focal abnormality. Adrenals/Urinary Tract: Adrenal glands are unremarkable. Kidneys are normal, without renal calculi, focal lesion, or hydronephrosis. Bladder is unremarkable. Stomach/Bowel: Stomach is not abnormally distended but is displaced by the large left upper quadrant pseudo cyst. There is a markedly dilated bowel loop in the right upper quadrant, measuring up to about 5.8 cm in diameter. This appears to represent the proximal duodenum. Remainder of small bowel are decompressed. Contrast material is present throughout the colon. No colonic distention or wall thickening. Appendix is not identified. Vascular/Lymphatic: Aortic atherosclerosis. No enlarged abdominal or pelvic lymph nodes. Reproductive: Prostate gland is enlarged, measuring 5.3 cm diameter. Other: No free air or free fluid in the abdomen. Abdominal wall musculature appears intact. Musculoskeletal: Degenerative changes in the spine. Superior endplate compression with Schmorl's node at L2. No change since prior study. IMPRESSION: 1. Changes of acute pancreatitis with pancreatic ductal dilatation and small cyst in the tail. No evidence of pancreatic necrosis. 2. Marked enlargement of left upper quadrant pseudo cyst, now measuring 18 cm in maximal diameter. Mass effect from the pseudocyst causes displacement of  the stomach and abdominal organs. 3. New markedly dilated bowel loop in the right upper quadrant probably representing proximal duodenum. This is consistent with obstruction, likely  from extrinsic compression. 4. Enlarged prostate gland. Aortic Atherosclerosis (ICD10-I70.0). Electronically Signed   By: Burman Nieves M.D.   On: 02/23/2018 23:53   Dg Chest Portable 1 View  Result Date: 02/24/2018 CLINICAL DATA:  NG tube placement EXAM: PORTABLE CHEST 1 VIEW COMPARISON:  11/28/2017 FINDINGS: Lungs are clear.  No pleural effusion or pneumothorax. The heart is normal in size. Enteric tube terminates in the gastric cardia. IMPRESSION: Enteric tube terminates in the gastric cardia. Electronically Signed   By: Charline Bills M.D.   On: 02/24/2018 02:37    Procedures .Critical Care Performed by: Terrilee Files, MD Authorized by: Terrilee Files, MD   Critical care provider statement:    Critical care time (minutes):  45   Critical care was necessary to treat or prevent imminent or life-threatening deterioration of the following conditions:  Metabolic crisis   Critical care was time spent personally by me on the following activities:  Discussions with consultants, evaluation of patient's response to treatment, examination of patient, ordering and performing treatments and interventions, ordering and review of laboratory studies, ordering and review of radiographic studies, pulse oximetry, re-evaluation of patient's condition, obtaining history from patient or surrogate, review of old charts and development of treatment plan with patient or surrogate   I assumed direction of critical care for this patient from another provider in my specialty: no     (including critical care time)  Medications Ordered in ED Medications  potassium chloride 10 mEq in 100 mL IVPB (10 mEq Intravenous New Bag/Given 02/23/18 1957)  sodium chloride 0.9 % bolus 1,000 mL (0 mLs Intravenous Stopped 02/23/18 1930)  ondansetron (ZOFRAN) injection 4 mg (4 mg Intravenous Given 02/23/18 1923)  morphine 4 MG/ML injection 4 mg (4 mg Intravenous Given 02/23/18 1953)  iohexol (OMNIPAQUE) 300 MG/ML solution  100 mL (100 mLs Intravenous Contrast Given 02/23/18 2102)     Initial Impression / Assessment and Plan / ED Course  I have reviewed the triage vital signs and the nursing notes.  Pertinent labs & imaging results that were available during my care of the patient were reviewed by me and considered in my medical decision making (see chart for details).  Clinical Course as of Feb 24 2127  Sheral Flow Feb 23, 2018  2016 Patient's lab work significant for a drop in his hemoglobin from priors.  He also has a very low potassium and an elevated lipase.  Chaperone was present during exam. Patient has normal sphincter tone empty rectal vault.  Stool was sent for guaiac.   [MB]  2112 Brought patient over to CAT scan but then they want him to come back and take oral contrast because of his weight.   [MB]  2119 I reviewed the current work-up with Dr. Sharl Ma from the hospitalist service.  He states he wants the results of the CAT scan before agreeing to admit the patient but he will wait for my partner to let him know when the scan is done.   [MB]    Clinical Course User Index [MB] Terrilee Files, MD    Final Clinical Impressions(s) / ED Diagnoses   Final diagnoses:  Acute pancreatitis, unspecified complication status, unspecified pancreatitis type  Hypokalemia  Constipation, unspecified constipation type    ED Discharge Orders    None       Charm Barges,  Kayleen MemosMichael C, MD 02/24/18 1846    Terrilee FilesButler, Nakayla Rorabaugh C, MD 03/03/18 [phone]

## 2018-02-23 NOTE — ED Notes (Signed)
Pt vomited unknown amount in trash can.

## 2018-02-23 NOTE — ED Triage Notes (Signed)
Pt brought to ED via [facility] for complaints of ongoing abdominal pain. Pt states he has been admitted here and at [facility] for the same thing. Pt states he has been nauseated and vomited. Pt states he can not remember exactly when his last BM was.

## 2018-02-24 ENCOUNTER — Inpatient Hospital Stay (HOSPITAL_COMMUNITY): Payer: Medicare Other

## 2018-02-24 ENCOUNTER — Encounter (HOSPITAL_COMMUNITY): Payer: Medicare Other

## 2018-02-24 ENCOUNTER — Encounter (HOSPITAL_COMMUNITY): Payer: Self-pay

## 2018-02-24 DIAGNOSIS — K863 Pseudocyst of pancreas: Secondary | ICD-10-CM | POA: Diagnosis present

## 2018-02-24 DIAGNOSIS — E876 Hypokalemia: Secondary | ICD-10-CM | POA: Diagnosis present

## 2018-02-24 DIAGNOSIS — R7303 Prediabetes: Secondary | ICD-10-CM | POA: Diagnosis present

## 2018-02-24 DIAGNOSIS — K315 Obstruction of duodenum: Secondary | ICD-10-CM

## 2018-02-24 DIAGNOSIS — K859 Acute pancreatitis without necrosis or infection, unspecified: Secondary | ICD-10-CM | POA: Diagnosis present

## 2018-02-24 DIAGNOSIS — E43 Unspecified severe protein-calorie malnutrition: Secondary | ICD-10-CM

## 2018-02-24 DIAGNOSIS — R9431 Abnormal electrocardiogram [ECG] [EKG]: Secondary | ICD-10-CM | POA: Diagnosis present

## 2018-02-24 DIAGNOSIS — M797 Fibromyalgia: Secondary | ICD-10-CM | POA: Diagnosis present

## 2018-02-24 DIAGNOSIS — Z79899 Other long term (current) drug therapy: Secondary | ICD-10-CM | POA: Diagnosis not present

## 2018-02-24 DIAGNOSIS — K219 Gastro-esophageal reflux disease without esophagitis: Secondary | ICD-10-CM | POA: Diagnosis present

## 2018-02-24 DIAGNOSIS — E871 Hypo-osmolality and hyponatremia: Secondary | ICD-10-CM | POA: Diagnosis present

## 2018-02-24 DIAGNOSIS — K861 Other chronic pancreatitis: Secondary | ICD-10-CM | POA: Diagnosis present

## 2018-02-24 DIAGNOSIS — Z681 Body mass index (BMI) 19 or less, adult: Secondary | ICD-10-CM | POA: Diagnosis not present

## 2018-02-24 DIAGNOSIS — F17210 Nicotine dependence, cigarettes, uncomplicated: Secondary | ICD-10-CM | POA: Diagnosis present

## 2018-02-24 DIAGNOSIS — F410 Panic disorder [episodic paroxysmal anxiety] without agoraphobia: Secondary | ICD-10-CM | POA: Diagnosis present

## 2018-02-24 DIAGNOSIS — G35 Multiple sclerosis: Secondary | ICD-10-CM | POA: Diagnosis present

## 2018-02-24 DIAGNOSIS — I10 Essential (primary) hypertension: Secondary | ICD-10-CM | POA: Diagnosis present

## 2018-02-24 LAB — COMPREHENSIVE METABOLIC PANEL
ALT: 10 U/L (ref 0–44)
AST: 10 U/L — ABNORMAL LOW (ref 15–41)
Albumin: 2.6 g/dL — ABNORMAL LOW (ref 3.5–5.0)
Alkaline Phosphatase: 67 U/L (ref 38–126)
Anion gap: 12 (ref 5–15)
BILIRUBIN TOTAL: 1 mg/dL (ref 0.3–1.2)
BUN: 9 mg/dL (ref 6–20)
CO2: 30 mmol/L (ref 22–32)
Calcium: 8.3 mg/dL — ABNORMAL LOW (ref 8.9–10.3)
Chloride: 94 mmol/L — ABNORMAL LOW (ref 98–111)
Creatinine, Ser: 0.64 mg/dL (ref 0.61–1.24)
GFR calc Af Amer: >60 mL/min (ref >60)
GFR calc non Af Amer: >60 mL/min (ref >60)
Glucose, Bld: 84 mg/dL (ref 70–99)
Potassium: 3.2 mmol/L — ABNORMAL LOW (ref 3.5–5.1)
Sodium: 136 mmol/L (ref 135–145)
Total Protein: 6.1 g/dL — ABNORMAL LOW (ref 6.5–8.1)

## 2018-02-24 LAB — CBC
HCT: 34.6 % — ABNORMAL LOW (ref 39.0–52.0)
Hemoglobin: 11.2 g/dL — ABNORMAL LOW (ref 13.0–17.0)
MCH: 29.9 pg (ref 26.0–34.0)
MCHC: 32.4 g/dL (ref 30.0–36.0)
MCV: 92.5 fL (ref 80.0–100.0)
Platelets: 582 10*3/uL — ABNORMAL HIGH (ref 150–400)
RBC: 3.74 MIL/uL — ABNORMAL LOW (ref 4.22–5.81)
RDW: 13.2 % (ref 11.5–15.5)
WBC: 11.8 10*3/uL — ABNORMAL HIGH (ref 4.0–10.5)
nRBC: 0.3 % — ABNORMAL HIGH (ref 0.0–0.2)

## 2018-02-24 LAB — URINALYSIS, ROUTINE W REFLEX MICROSCOPIC
Bilirubin Urine: NEGATIVE
GLUCOSE, UA: NEGATIVE mg/dL
HGB URINE DIPSTICK: NEGATIVE
Ketones, ur: 80 mg/dL — AB
LEUKOCYTES UA: NEGATIVE
Nitrite: NEGATIVE
PROTEIN: NEGATIVE mg/dL
pH: 6 (ref 5.0–8.0)

## 2018-02-24 LAB — LIPASE, BLOOD: Lipase: 108 U/L — ABNORMAL HIGH (ref 11–51)

## 2018-02-24 LAB — MAGNESIUM: Magnesium: 2.3 mg/dL (ref 1.7–2.4)

## 2018-02-24 MED ORDER — ONDANSETRON HCL 4 MG PO TABS: 4.0000 mg | ORAL_TABLET | Freq: Four times a day (QID) | ORAL | Status: DC | PRN

## 2018-02-24 MED ORDER — LORAZEPAM 2 MG/ML IJ SOLN: 1.0000 mg | Freq: Four times a day (QID) | INTRAMUSCULAR | 0 refills | Status: DC | PRN

## 2018-02-24 MED ORDER — SODIUM CHLORIDE 0.9 % IV SOLN
INTRAVENOUS | Status: DC
  Administered 2018-02-24 – 2018-02-25 (×4): via INTRAVENOUS

## 2018-02-24 MED ORDER — ONDANSETRON HCL 4 MG/2ML IJ SOLN
4.0000 mg | Freq: Four times a day (QID) | INTRAMUSCULAR | Status: DC | PRN
  Administered 2018-02-24 – 2018-02-25 (×5): 4 mg via INTRAVENOUS
  Filled 2018-02-24 (×5): qty 2

## 2018-02-24 MED ORDER — MORPHINE SULFATE (PF) 2 MG/ML IV SOLN: 2.0000 mg | INTRAVENOUS | 0 refills | Status: DC | PRN

## 2018-02-24 MED ORDER — ONDANSETRON HCL 4 MG/2ML IJ SOLN
INTRAMUSCULAR | Status: AC
  Administered 2018-02-24: 4 mg via INTRAVENOUS
  Filled 2018-02-24: qty 2

## 2018-02-24 MED ORDER — MORPHINE SULFATE (PF) 2 MG/ML IV SOLN
2.0000 mg | INTRAVENOUS | Status: DC | PRN
  Administered 2018-02-24 – 2018-02-25 (×6): 2 mg via INTRAVENOUS
  Filled 2018-02-24 (×6): qty 1

## 2018-02-24 MED ORDER — POTASSIUM CHLORIDE 10 MEQ/100ML IV SOLN
10.0000 meq | INTRAVENOUS | Status: AC
  Administered 2018-02-24 (×6): 10 meq via INTRAVENOUS
  Filled 2018-02-24 (×2): qty 100

## 2018-02-24 MED ORDER — ENOXAPARIN SODIUM 40 MG/0.4ML ~~LOC~~ SOLN: 40.0000 mg | SUBCUTANEOUS | 0 refills | Status: DC

## 2018-02-24 MED ORDER — ONDANSETRON HCL 4 MG/2ML IJ SOLN
4.0000 mg | Freq: Once | INTRAMUSCULAR | Status: AC
  Administered 2018-02-24: 4 mg via INTRAVENOUS

## 2018-02-24 MED ORDER — ENOXAPARIN SODIUM 40 MG/0.4ML ~~LOC~~ SOLN
40.0000 mg | SUBCUTANEOUS | Status: DC
  Filled 2018-02-24 (×2): qty 0.4

## 2018-02-24 MED ORDER — LORAZEPAM 2 MG/ML IJ SOLN
1.0000 mg | Freq: Four times a day (QID) | INTRAMUSCULAR | Status: DC | PRN
  Administered 2018-02-24 (×4): 1 mg via INTRAVENOUS
  Filled 2018-02-24 (×5): qty 1

## 2018-02-24 NOTE — ED Provider Notes (Signed)
Blood pressure (!) 116/91, pulse 99, temperature 98.9 F (37.2 C), temperature source Oral, resp. rate 12, weight 49 kg, SpO2 92 %.  Assuming care from Dr. Charm Barges.  In short, Brent [name] is a 48 y.o. male with a chief complaint of Abdominal Pain .  Refer to the original H&P for additional details.  The current plan of care is to f/u CT and reassess.  Followed up CT. Will place NG tube.   Discussed patient's case with Hospitalist, Dr. Sharl Ma to request admission. Patient and family (if present) updated with plan. Care transferred to Hospitalist service.  I reviewed all nursing notes, vitals, pertinent old records, EKGs, labs, imaging (as available).     Maia Plan, MD 02/24/18 [phone]

## 2018-02-24 NOTE — H&P (Addendum)
TRH H&P    Patient Demographics:    Brent [name], is a 48 y.o. male  MRN: [id number]  DOB - [birth year]  Admit Date - 02/23/2018  Referring MD/NP/PA: Dr. Alona Bene  Outpatient Primary MD for the patient is Gareth Morgan, MD  Patient coming from: Home  Chief complaint-abdominal pain   HPI:    Brent [name]  is a 48 y.o. male, with history of MS, chronic pancreatitis, alcohol abuse, hypertension came to the ED with complaints of abdominal pain.  Patient says he was discharged from [facility] last month and has not felt right.  For the past 2 days he has been vomiting.  He has had over 20 pound weight loss over past 1 month.  Patient says that he has not been able to eat anything at home and whatever he try to eat he vomited. His last alcoholic drink was 6 weeks ago. He denies chest pain or shortness of breath Denies fever or chills. Denies dysuria. No previous history of stroke or seizures. Complains of persistent nausea and vomiting.    Review of systems:    In addition to the HPI above,    All other systems reviewed and are negative.    Past History of the following :    Past Medical History:  Diagnosis Date  . Alcohol use   . Anxiety    Disabled due to panic attacks  . Arthritis   . Bilateral ureteral calculi   . Borderline type 2 diabetes mellitus   . BPH (benign prostatic hypertrophy)   . Chronic back pain   . Condylomata acuminata in male    multiple procedures --  penile, peri-rectal , perineum  . DDD (degenerative disc disease), cervical   . Depression   . Dyspnea on exertion   . Emphysematous COPD (HCC)   . Epiretinal membrane (ERM) of both eyes   . Fibromyalgia   . GERD (gastroesophageal reflux disease)   . Headache(784.0)   . History of chronic pancreatitis    severeal adx for this /  2008  dx alcoholic pancreatitis  . History of esophageal dilatation   . History of  hiatal hernia   . History of kidney stones   . History of panic attacks   . History of sepsis    adx 05-01-2014--  urosepsis due to kidney stones obstruction/ hydronephrosis  . History of suicidal ideation    2006   adx  . Hypertension    was on medication for short time, htn was caused by prednisone per pt  . Multiple sclerosis (HCC)    pt. states has 4 small brain lesions  . Nephrolithiasis    bilateral   . Retinal vasculitis   . Uveitis       Past Surgical History:  Procedure Laterality Date  . BIOPSY  11/12/2012   Procedure: GASTRIC AND ESOPHAGEAL BIOPSIES;  Surgeon: Corbin Ade, MD;  Location: [facility];  Service: Endoscopy;;  . CATARACT EXTRACTION W/ INTRAOCULAR LENS  IMPLANT, BILATERAL    . COLONOSCOPY  10/08/2011  Jenkins:Normal colon/Anal condyloma without extension proximal to dentate line  . CYSTOSCOPY W/ URETERAL STENT PLACEMENT Bilateral 05/01/2014   Procedure: CYSTOSCOPY WITH BILATERAL RETROGRADE PYELOGRAM; BILATERAL URETERAL STENT PLACEMENT;  Surgeon: Barron Alvine, MD;  Location: [facility];  Service: Urology;  Laterality: Bilateral;  . CYSTOSCOPY W/ URETERAL STENT REMOVAL Bilateral 06/06/2014   Procedure: CYSTOSCOPY WITH STENT REMOVAL;  Surgeon: Marcine Matar, MD;  Location: [facility];  Service: Urology;  Laterality: Bilateral;  . CYSTOSCOPY WITH URETEROSCOPY  06/06/2014   Procedure: CYSTOSCOPY WITH URETEROSCOPY;  Surgeon: Marcine Matar, MD;  Location: [facility];  Service: Urology;;  . Bluford Kaufmann WITH URETEROSCOPY AND STENT PLACEMENT Bilateral 06/06/2014   Procedure: CYSTOSCOPY WITH J2 STENT EXTRACTION,,URETEROSCOPY WITH EXTRACTION OF STONES,;  Surgeon: Marcine Matar, MD;  Location: [facility];  Service: Urology;  Laterality: Bilateral;  . ELECTROCAUTERY/ DESICCATION OF CONDYLOMA LESIONS  01-15-2008  &  12-29-2009   PENIS, PERI-RECTAL , PERINUEM  . ESOPHAGOGASTRODUODENOSCOPY (EGD) WITH PROPOFOL N/A 11/12/2012    ONG:EXBMWU esophagus s/p  passage of a Maloney dilator and biopsy. Abnormal gastric mucosa-status post biopsy  . FLEXIBLE BRONCHOSCOPY N/A 08/12/2012   Procedure: FLEXIBLE BRONCHOSCOPY;  Surgeon: Fredirick Maudlin, MD;  Location: [facility];  Service: Pulmonary;  Laterality: N/A;  . MALONEY DILATION N/A 11/12/2012   Procedure: MALONEY DILATION (54mm);  Surgeon: Corbin Ade, MD;  Location: [facility];  Service: Endoscopy;  Laterality: N/A;  . MULTIPLE EXTRACTIONS WITH ALVEOLOPLASTY N/A 04/22/2014   Procedure: MULTIPLE EXTRACTIONS ( 2,3,5,6,7,8,9,10,11,13,14,15,21,28  WITH ALVEOLOPLASTY;  Surgeon: Ocie Doyne, DDS;  Location: [facility];  Service: Oral Surgery;  Laterality: N/A;  . WISDOM TOOTH EXTRACTION        Social History:      Social History   Tobacco Use  . Smoking status: Current Every Day Smoker    Packs/day: 3.00    Years: 20.00    Pack years: 60.00    Types: Cigarettes  . Smokeless tobacco: Never Used  Substance Use Topics  . Alcohol use: Yes       Family History :     Family History  Problem Relation Age of Onset  . Hypertension Mother   . Breast cancer Mother   . Melanoma Mother   . Stroke Mother   . Lung cancer Father 35  . Colon cancer Neg Hx   . Colitis Neg Hx   . Cirrhosis Neg Hx   . Liver disease Neg Hx   . Pancreatic cancer Neg Hx   . Pancreatitis Neg Hx   . Anesthesia problems Neg Hx   . Hypotension Neg Hx   . Malignant hyperthermia Neg Hx   . Pseudochol deficiency Neg Hx       Home Medications:   Prior to Admission medications   Medication Sig Start Date End Date Taking? Authorizing Provider  ALPRAZolam Prudy Feeler) 1 MG tablet Take 1 mg by mouth 4 (four) times daily.   Yes [provider]  CREON 3000-9500 units CPEP Take 3,000 Units by mouth 3 (three) times daily with meals. Or snacks 10/03/17  Yes [provider]  HYDROcodone-acetaminophen (NORCO) 10-325 MG tablet Take 1 tablet by mouth 2 (two) times daily.  01/03/16  Yes [provider]  ketorolac (ACULAR) 0.4 % SOLN Place 2 drops into both eyes 4 (four) times daily.  06/24/14  Yes [provider]  mometasone-formoterol (DULERA) 100-5 MCG/ACT AERO Inhale 2 puffs into the lungs daily.   Yes [provider]  omeprazole (PRILOSEC) 40  MG capsule Take 40 mg by mouth at bedtime.    Yes [provider]  OXcarbazepine (TRILEPTAL) 150 MG tablet Take 150 mg by mouth 2 (two) times daily.   Yes [provider]  prednisoLONE acetate (PRED FORTE) 1 % ophthalmic suspension Place 2 drops into both eyes 2 (two) times daily.  01/24/15  Yes [provider]  zolpidem (AMBIEN) 10 MG tablet Take 10 mg by mouth at bedtime.   Yes [provider]     Allergies:    No Known Allergies   Physical Exam:   Vitals  Blood pressure (!) 116/91, pulse 99, temperature 98.9 F (37.2 C), temperature source Oral, resp. rate 12, weight 49 kg, SpO2 92 %.  1.  General: Cachectic appearing male in mild distress from pain/vomiting  2. Psychiatric: Alert, oriented x3, intact judgment, clear insight  3. Neurologic: Motor strength 5/5 in all extremities, no focal neurological deficit noted  4. HEENMT:  Oral mucosa is dry, PERRLA, anicteric sclera  5. Respiratory : Clear to auscultation bilaterally, no wheezing or crackles auscultated  6. Cardiovascular : S1-S2, regular, no murmurs auscultated.  No edema in the lower extremities  7. Gastrointestinal:  Abdomen is soft, distended, mass palpable in left upper quadrant region, nontender to palpation  8. Skin:  No rashes noted  9.Musculoskeletal:  No limitation of range of motion of all joints, mild atrophy of muscles noted in both upper and lower extremities.    Data Review:    CBC Recent Labs  Lab 02/23/18 1835  WBC 11.6*  HGB 11.2*  HCT 34.7*  PLT 621*  MCV 91.6  MCH 29.6  MCHC 32.3  RDW 13.4    ------------------------------------------------------------------------------------------------------------------  Results for orders placed or performed during the hospital encounter of 02/23/18 (from the past 48 hour(s))  Lipase, blood     Status: Abnormal   Collection Time: 02/23/18  6:35 PM  Result Value Ref Range   Lipase 363 (H) 11 - 51 U/L    Comment: Performed at [facility], [address] [place], [place] [address]  Comprehensive metabolic panel     Status: Abnormal   Collection Time: 02/23/18  6:35 PM  Result Value Ref Range   Sodium 131 (L) 135 - 145 mmol/L   Potassium 2.6 (LL) 3.5 - 5.1 mmol/L    Comment: CRITICAL RESULT CALLED TO, READ BACK BY AND VERIFIED WITH: CRAWFORD,H ON 02/23/18 AT 1930 BY LOY,C    Chloride 93 (L) 98 - 111 mmol/L   CO2 27 22 - 32 mmol/L   Glucose, Bld 97 70 - 99 mg/dL   BUN 11 6 - 20 mg/dL   Creatinine, Ser 6.040.52 (L) 0.61 - 1.24 mg/dL   Calcium 7.9 (L) 8.9 - 10.3 mg/dL   Total Protein 6.7 6.5 - 8.1 g/dL   Albumin 2.9 (L) 3.5 - 5.0 g/dL   AST 14 (L) 15 - 41 U/L   ALT 10 0 - 44 U/L   Alkaline Phosphatase 77 38 - 126 U/L   Total Bilirubin 0.8 0.3 - 1.2 mg/dL   GFR calc non Af Amer >60 >60 mL/min   GFR calc Af Amer >60 >60 mL/min   Anion gap 11 5 - 15    Comment: Performed at [facility], [address] [place], [place] [address]  CBC     Status: Abnormal   Collection Time: 02/23/18  6:35 PM  Result Value Ref Range   WBC 11.6 (H) 4.0 - 10.5 K/uL   RBC 3.79 (  L) 4.22 - 5.81 MIL/uL   Hemoglobin 11.2 (L) 13.0 - 17.0 g/dL   HCT 16.134.7 (L) 09.639.0 - 04.552.0 %   MCV 91.6 80.0 - 100.0 fL   MCH 29.6 26.0 - 34.0 pg   MCHC 32.3 30.0 - 36.0 g/dL   RDW 40.913.4 81.111.5 - 91.415.5 %   Platelets 621 (H) 150 - 400 K/uL   nRBC 0.3 (H) 0.0 - 0.2 %    Comment: Performed at [facility], [address] [place], [place] [address]  POC occult blood, ED Provider will collect     Status: None   Collection Time: 02/23/18  8:15 PM  Result Value Ref Range   Fecal Occult Bld  NEGATIVE NEGATIVE    Chemistries  Recent Labs  Lab 02/23/18 1835  NA 131*  K 2.6*  CL 93*  CO2 27  GLUCOSE 97  BUN 11  CREATININE 0.52*  CALCIUM 7.9*  AST 14*  ALT 10  ALKPHOS 77  BILITOT 0.8   ------------------------------------------------------------------------------------------------------------------  ------------------------------------------------------------------------------------------------------------------ GFR: Estimated Creatinine Clearance: 77.4 mL/min (A) (by C-G formula based on SCr of 0.52 mg/dL (L)). Liver Function Tests: Recent Labs  Lab 02/23/18 1835  AST 14*  ALT 10  ALKPHOS 77  BILITOT 0.8  PROT 6.7  ALBUMIN 2.9*   Recent Labs  Lab 02/23/18 1835  LIPASE 363*    --------------------------------------------------------------------------------------------------------------- Urine analysis:    Component Value Date/Time   COLORURINE STRAW (A) 11/27/2017 1504   APPEARANCEUR CLEAR 11/27/2017 1504   LABSPEC 1.028 11/27/2017 1504   PHURINE 7.0 11/27/2017 1504   GLUCOSEU NEGATIVE 11/27/2017 1504   HGBUR NEGATIVE 11/27/2017 1504   BILIRUBINUR NEGATIVE 11/27/2017 1504   KETONESUR NEGATIVE 11/27/2017 1504   PROTEINUR NEGATIVE 11/27/2017 1504   UROBILINOGEN 0.2 11/06/2014 2345   NITRITE NEGATIVE 11/27/2017 1504   LEUKOCYTESUR NEGATIVE 11/27/2017 1504      Imaging Results:    Ct Abdomen Pelvis W Contrast  Result Date: 02/23/2018 CLINICAL DATA:  Ongoing abdominal pain. Nausea and vomiting. Recent admissions for the same thing. EXAM: CT ABDOMEN AND PELVIS WITH CONTRAST TECHNIQUE: Multidetector CT imaging of the abdomen and pelvis was performed using the standard protocol following bolus administration of intravenous contrast. CONTRAST:  100mL OMNIPAQUE IOHEXOL 300 MG/ML  SOLN COMPARISON:  02/05/2018 FINDINGS: Lower chest: Atelectasis or infiltration in the lung bases, improving since previous study. Hepatobiliary: No focal liver abnormality is  seen. No gallstones, gallbladder wall thickening, or biliary dilatation. Pancreas: Hypoenhancing pancreatic parenchyma in the body and tail with pancreatic ductal dilatation and small cyst in the tail. These findings were present previously and are consistent with pancreatitis. There is persistent edema and stranding around the pancreas. Small pseudo cysts which were seen previously in the left upper quadrant have demonstrated marked enlargement since the previous study. Left upper quadrant and left abdominal pseudo cyst now measures 18.3 x 15.9 x 9.6 cm. The pseudocyst compresses the stomach and abdominal organs. Additional smaller cystic collections demonstrated anterior to the pancreatic body and tail. Splenic veins appear patent. Spleen: Normal in size without focal abnormality. Adrenals/Urinary Tract: Adrenal glands are unremarkable. Kidneys are normal, without renal calculi, focal lesion, or hydronephrosis. Bladder is unremarkable. Stomach/Bowel: Stomach is not abnormally distended but is displaced by the large left upper quadrant pseudo cyst. There is a markedly dilated bowel loop in the right upper quadrant, measuring up to about 5.8 cm in diameter. This appears to represent the proximal duodenum. Remainder of small bowel are decompressed. Contrast material is present throughout the colon.  No colonic distention or wall thickening. Appendix is not identified. Vascular/Lymphatic: Aortic atherosclerosis. No enlarged abdominal or pelvic lymph nodes. Reproductive: Prostate gland is enlarged, measuring 5.3 cm diameter. Other: No free air or free fluid in the abdomen. Abdominal wall musculature appears intact. Musculoskeletal: Degenerative changes in the spine. Superior endplate compression with Schmorl's node at L2. No change since prior study. IMPRESSION: 1. Changes of acute pancreatitis with pancreatic ductal dilatation and small cyst in the tail. No evidence of pancreatic necrosis. 2. Marked enlargement of left  upper quadrant pseudo cyst, now measuring 18 cm in maximal diameter. Mass effect from the pseudocyst causes displacement of the stomach and abdominal organs. 3. New markedly dilated bowel loop in the right upper quadrant probably representing proximal duodenum. This is consistent with obstruction, likely from extrinsic compression. 4. Enlarged prostate gland. Aortic Atherosclerosis (ICD10-I70.0). Electronically Signed   By: Burman Nieves M.D.   On: 02/23/2018 23:53    My personal review of EKG: Rhythm NSR, QTC 510    Assessment & Plan:    Active Problems:   Pancreatitis   1. Acute on chronic pancreatitis-patient has elevated lipase, CT scan shows changes consistent with acute pancreatitis.  No evidence of pancreatic necrosis.  Will keep patient n.p.o. started on IV normal saline.  Zofran as needed for nausea vomiting.  Morphine PRN for pain.  Will consult gastroenterology in a.m.  2. Large pseudocyst compressing on the stomach and duodenum-CT scan showed large pseudocyst measuring 18 cm in maximal diameter compressing stomach and abdominal organs.  Causing dilated bowel loop in right upper quadrant slightly proximal duodenum.  Endoscopic drainage may be necessary to relieve the obstruction.  GI has been consulted as above for further recommendations.  3. Duodenal obstruction-dilated bowel loop in the right upper quadrant representing proximal duodenum seen on CT scan.  Will insert NG tube to low intermittent suction to decompress stomach and duodenum.  4. Nutrition-patient has severe malnutrition, albumin 2.9 due to above.  Patient might need PICC line placement and TPN till pseudocyst compression is relieved.  Will await GI recommendations.  5. Hypokalemia-potassium is 2.6, IV KCl 10 mEq x 4 have been ordered.  Follow BMP in a.m.  6. Prolonged QTC-likely from light abnormalities.  Will monitor closely on cardiac monitoring.  Check serum magnesium level.  7. Hyponatremia-sodium is 131,  likely from persistent vomiting, poor p.o. intake.  Started on IV normal saline.  Follow sodium level in a.m.    DVT Prophylaxis-   Lovenox   AM Labs Ordered, also please review Full Orders  Family Communication: Admission, patients condition and plan of care including tests being ordered have been discussed with the patient and his family members at bedside* who indicate understanding and agree with the plan and Code Status.  Code Status: Full code  Admission status: Inpatient: Based on patients clinical presentation and evaluation of above clinical data, I have made determination that patient meets Inpatient criteria at this time.  Patient has duodenal obstruction from large pseudocyst.  Acute pancreatitis.  Started on IV fluids.  Time spent in minutes : 60 minutes   Meredeth Ide M.D on 02/24/2018 at 12:58 AM  Between 7am to 7pm - Pager - [phone]. After 7pm go to [email or web address] - password [facility]   [facility] - Office  [phone]

## 2018-02-24 NOTE — Progress Notes (Signed)
Initial Nutrition Assessment  DOCUMENTATION CODES:   Severe malnutrition in context of acute illness/injury, Underweight  INTERVENTION:  Nutrition support as appropriate  RD will continue to follow progression  NUTRITION DIAGNOSIS:   Severe Malnutrition related to acute illness, chronic illness(acute on chronic pancreatitis- CT findings of large pseudocyst) as evidenced by energy intake < or equal to 50% for > or equal to 5 days, severe muscle depletion, severe fat depletion.   GOAL:   Patient will meet greater than or equal to 90% of their needs   MONITOR:   Diet advancement, Labs  REASON FOR ASSESSMENT:   Malnutrition Screening Tool    ASSESSMENT: Patient is an underweight 48 yo male who presents with acute on chronic pancreatitis. History includes MS, ETOH abuse and hypertension.   GI consulted. CT scan findings - large pseudocyst. Possible TPN? He is NPO currently with NGT placed.   Patient sister is at bedside. Patient says he hasn't been able to eat the past 2 weeks. Patient says he has been mostly drinking Gingerale. At baseline he eats only when hungry- which is 1-2 times daily. Patient does like oral nutrition shakes but does not consume them on a regular basis.  Medications reviewed and include: Morphine q 4hr prn  IVF-NS @ 100 ml/hr  Labs: BMP Latest Ref Rng & Units 02/24/2018 02/23/2018 11/30/2017  Glucose 70 - 99 mg/dL 84 97 89  BUN 6 - 20 mg/dL 9 11 <4(E)  Creatinine 0.61 - 1.24 mg/dL 3.95 3.20(E) 3.34  Sodium 135 - 145 mmol/L 136 131(L) 137  Potassium 3.5 - 5.1 mmol/L 3.2(L) 2.6(LL) 2.9(L)  Chloride 98 - 111 mmol/L 94(L) 93(L) 106  CO2 22 - 32 mmol/L 30 27 27   Calcium 8.9 - 10.3 mg/dL 8.3(L) 7.9(L) 8.0(L)     NUTRITION - FOCUSED PHYSICAL EXAM:    Most Recent Value  Orbital Region  Severe depletion  Upper Arm Region  Severe depletion  Buccal Region  Severe depletion  Temple Region  Moderate depletion  Clavicle Bone Region  Severe depletion   Clavicle and Acromion Bone Region  Severe depletion  Dorsal Hand  Severe depletion  Patellar Region  Severe depletion  Anterior Thigh Region  Severe depletion  Edema (RD Assessment)  None  Hair  Reviewed  Skin  Reviewed       Diet Order:   Diet Order            Diet NPO time specified  Diet effective now              EDUCATION NEEDS: none identified     Skin:  Skin Assessment: Reviewed RN Assessment  Last BM:  unknown  Height:   Ht Readings from Last 1 Encounters:  02/24/18 5\' 7"  (1.702 m)    Weight:   Wt Readings from Last 1 Encounters:  02/23/18 49 kg    Ideal Body Weight:  67 kg  BMI:  Body mass index is 16.92 kg/m.  Estimated Nutritional Needs:   Kcal:  3568-6168 (35-38 kcal/kg/bw)  Protein:  88-98 gr (1.8-2.0 gr/kg/bw)  Fluid:  >1700 ml daily   Royann Shivers MS,RD,CSG,LDN Office: [phone] Pager: [phone]

## 2018-02-24 NOTE — Progress Notes (Signed)
PHARMACY - ADULT TOTAL PARENTERAL NUTRITION CONSULT NOTE   Pharmacy Consult for TPN Indication: sever pancreatitis with obstruction  Patient Measurements: Height: 5\' 7"  (170.2 cm) Weight: 108 lb 0.4 oz (49 kg) IBW/kg (Calculated) : 66.1   Body mass index is 16.92 kg/m.   Assessment: Patient has history of MS, pancreatitis, and sever alcohol abuse.  Presents to hospital with abdominal pain and states 20 pound weight loss over past month although weights in Epic have been stable in past year.  States has been unable to eat much. Patient to have PICC line placed.  GI:  Endo:  Insulin requirements in the past 24 hours:  Lytes: potassium 3.2. corrected calcium 9.4.  Rest WNL Renal: SCr 0.64 Pulm: Cards:  Hepatobil: Neuro: ID: N/A  TPN Access: awaiting PICC line placement. TPN start date: 1/15- cannot start 1/14 since past [facility] deadline. Recommend D10W  Nutritional Goals (per RD recommendation on 1/14): KCal: 1715-1862 (35-38 kcal/kg/bw) Protein: 88-98 g (1.8-2.0 g/kg/bw) Fluid: >1700 mL daily  Goal TPN rate is 70 ml/hr   Current Nutrition:   Plan:  Due to being past deadline for TPN and patient not having PICC line yet, recommend starting D10W. Patient is potential transfer so will reassess in AM for TPN needs while here.  Tad Moore 02/24/2018,3:22 PM

## 2018-02-24 NOTE — Consult Note (Addendum)
Referring Provider: Dr. :Sharl MaLama  Primary Care Physician:  Gareth MorganKnowlton, Steve, MD Primary Gastroenterologist:  Dr. Jena Gaussourk   Date of Admission: 02/23/2018 Date of Consultation: 02/24/2018  Reason for Consultation:  Pancreatitis with pseudocyst  HPI:  Brent [name] is a 48 y.o. year old male with history of  multiple prior admissions for acute on chronic pancreatitis, most recently Oct 2019. CT abd/pelvis with contrast at that time with acute pancreatitis but no pseudocyst mentioned in report. This admission with previously seen pseudocysts now with marked enlargement, largest 18.3 X 15.9 X 9.6 causing mass effect to stomach and abdominal organs. Markedly dilated bowel loop RUQ felt to represent proximal duodenum consistent with obstruction likely from extrinsic compression. NG tube placed. Historically, felt pancreatitis was secondary to prior ETOH use.     History of chronic pancreatitis dating back to 2006 per patient. Has had recurrent episodes but not seen by [facility] since 2014. 2012 underwent EUS by Dr. Christella HartiganJacobs with normal esophagus and stomach, irregular duodenal mucosa, question of peptic duodenitis and negative celiac. No changes of chronic pancreatitis or mass at time of EUS. He notes [facility]. States was vomiting prior to discharge. Abdominal bloating, LUQ pain. Unable to tolerate liquids. No significant oral intake [facility]. No BM for 2 weeks. Tried enema first and only small round stool. Tried to drink water and vomited. Extensive weight loss over last few weeks. No flatus. Presented to ED due to abdominal distension, N/V, inability to tolerate liquids.   Had been on prednisone prior to presentation [facility] but no longer taking. Last alcohol 7 months ago. Little bit of pain now mid abdomen.   Sept 2014 in office: Weight 143 lbs March 2019 weight 105 Oct 2019  Weight 102 lbs Dec 2019 at [facility] 110 lbs    Past Medical History:   Diagnosis Date  . Alcohol use   . Anxiety    Disabled due to panic attacks  . Arthritis   . Bilateral ureteral calculi   . Borderline type 2 diabetes mellitus   . BPH (benign prostatic hypertrophy)   . Chronic back pain   . Condylomata acuminata in male    multiple procedures --  penile, peri-rectal , perineum  . DDD (degenerative disc disease), cervical   . Depression   . Dyspnea on exertion   . Emphysematous COPD (HCC)   . Epiretinal membrane (ERM) of both eyes   . Fibromyalgia   . GERD (gastroesophageal reflux disease)   . Headache(784.0)   . History of chronic pancreatitis    severeal adx for this /  2008  dx alcoholic pancreatitis  . History of esophageal dilatation   . History of hiatal hernia   . History of kidney stones   . History of panic attacks   . History of sepsis    adx 05-01-2014--  urosepsis due to kidney stones obstruction/ hydronephrosis  . History of suicidal ideation    2006   adx  . Hypertension    was on medication for short time, htn was caused by prednisone per pt  . Multiple sclerosis (HCC)    pt. states has 4 small brain lesions  . Nephrolithiasis    bilateral   . Retinal vasculitis   . Uveitis     Past Surgical History:  Procedure Laterality Date  . BIOPSY  11/12/2012   Procedure: GASTRIC AND ESOPHAGEAL BIOPSIES;  Surgeon: Corbin Adeobert M Rourk, MD;  Location: [facility];  Service: Endoscopy;;  .  CATARACT EXTRACTION W/ INTRAOCULAR LENS  IMPLANT, BILATERAL    . COLONOSCOPY  10/08/2011   Jenkins:Normal colon/Anal condyloma without extension proximal to dentate line  . CYSTOSCOPY W/ URETERAL STENT PLACEMENT Bilateral 05/01/2014   Procedure: CYSTOSCOPY WITH BILATERAL RETROGRADE PYELOGRAM; BILATERAL URETERAL STENT PLACEMENT;  Surgeon: Barron Alvine, MD;  Location: [facility];  Service: Urology;  Laterality: Bilateral;  . CYSTOSCOPY W/ URETERAL STENT REMOVAL Bilateral 06/06/2014   Procedure: CYSTOSCOPY WITH STENT REMOVAL;  Surgeon: Marcine Matar, MD;   Location: [facility];  Service: Urology;  Laterality: Bilateral;  . CYSTOSCOPY WITH URETEROSCOPY  06/06/2014   Procedure: CYSTOSCOPY WITH URETEROSCOPY;  Surgeon: Marcine Matar, MD;  Location: [facility];  Service: Urology;;  . Bluford Kaufmann WITH URETEROSCOPY AND STENT PLACEMENT Bilateral 06/06/2014   Procedure: CYSTOSCOPY WITH J2 STENT EXTRACTION,,URETEROSCOPY WITH EXTRACTION OF STONES,;  Surgeon: Marcine Matar, MD;  Location: [facility];  Service: Urology;  Laterality: Bilateral;  . ELECTROCAUTERY/ DESICCATION OF CONDYLOMA LESIONS  01-15-2008  &  12-29-2009   PENIS, PERI-RECTAL , PERINUEM  . ESOPHAGOGASTRODUODENOSCOPY (EGD) WITH PROPOFOL N/A 11/12/2012   EVO:[id number] esophagus s/p  passage of a Maloney dilator and biopsy. Abnormal gastric mucosa-status post biopsy  . FLEXIBLE BRONCHOSCOPY N/A 08/12/2012   Procedure: FLEXIBLE BRONCHOSCOPY;  Surgeon: Fredirick Maudlin, MD;  Location: [facility];  Service: Pulmonary;  Laterality: N/A;  . MALONEY DILATION N/A 11/12/2012   Procedure: MALONEY DILATION (60mm);  Surgeon: Corbin Ade, MD;  Location: [facility];  Service: Endoscopy;  Laterality: N/A;  . MULTIPLE EXTRACTIONS WITH ALVEOLOPLASTY N/A 04/22/2014   Procedure: MULTIPLE EXTRACTIONS ( 2,3,5,6,7,8,9,10,11,13,14,15,21,28  WITH ALVEOLOPLASTY;  Surgeon: Ocie Doyne, DDS;  Location: [facility];  Service: Oral Surgery;  Laterality: N/A;  . WISDOM TOOTH EXTRACTION      Prior to Admission medications   Medication Sig Start Date End Date Taking? Authorizing Provider  ALPRAZolam Prudy Feeler) 1 MG tablet Take 1 mg by mouth 4 (four) times daily.   Yes [provider]  CREON 3000-9500 units CPEP Take 3,000 Units by mouth 3 (three) times daily with meals. Or snacks 10/03/17  Yes [provider]  HYDROcodone-acetaminophen (NORCO) 10-325 MG tablet Take 1 tablet by mouth 2 (two) times daily.  01/03/16  Yes [provider]  ketorolac (ACULAR) 0.4 % SOLN  Place 2 drops into both eyes 4 (four) times daily.  06/24/14  Yes [provider]  mometasone-formoterol (DULERA) 100-5 MCG/ACT AERO Inhale 2 puffs into the lungs daily.   Yes [provider]  omeprazole (PRILOSEC) 40 MG capsule Take 40 mg by mouth at bedtime.    Yes [provider]  OXcarbazepine (TRILEPTAL) 150 MG tablet Take 150 mg by mouth 2 (two) times daily.   Yes [provider]  prednisoLONE acetate (PRED FORTE) 1 % ophthalmic suspension Place 2 drops into both eyes 2 (two) times daily.  01/24/15  Yes [provider]  zolpidem (AMBIEN) 10 MG tablet Take 10 mg by mouth at bedtime.   Yes [provider]    Current Facility-Administered Medications  Medication Dose Route Frequency Provider Last Rate Last Dose  . 0.9 %  sodium chloride infusion   Intravenous Continuous Meredeth Ide, MD 100 mL/hr at 02/24/18 0751    . enoxaparin (LOVENOX) injection 40 mg  40 mg Subcutaneous Q24H [place], Gagan S, MD      . LORazepam (ATIVAN) injection 1 mg  1 mg Intravenous Q6H PRN Meredeth Ide, MD   1 mg at 02/24/18 0835  .  morphine 2 MG/ML injection 2 mg  2 mg Intravenous Q4H PRN Meredeth IdeLama, Gagan S, MD   2 mg at 02/24/18 1053  . ondansetron (ZOFRAN) tablet 4 mg  4 mg Oral Q6H PRN Meredeth IdeLama, Gagan S, MD       Or  . ondansetron [facility](ZOFRAN) injection 4 mg  4 mg Intravenous Q6H PRN Meredeth IdeLama, Gagan S, MD   4 mg at 02/24/18 0835  . potassium chloride 10 mEq in 100 mL IVPB  10 mEq Intravenous Q1 Hr x 6 Berton Mountgbata, Sylvester I, MD 100 mL/hr at 02/24/18 1303 10 mEq at 02/24/18 1303    Allergies as of 02/23/2018  . (No Known Allergies)    Family History  Problem Relation Age of Onset  . Hypertension Mother   . Breast cancer Mother   . Melanoma Mother   . Stroke Mother   . Lung cancer Father [id number]  . Colon cancer Neg Hx   . Colitis Neg Hx   . Cirrhosis Neg Hx   . Liver disease Neg Hx   . Pancreatic cancer Neg Hx   . Pancreatitis Neg Hx   . Anesthesia problems Neg Hx   .  Hypotension Neg Hx   . Malignant hyperthermia Neg Hx   . Pseudochol deficiency Neg Hx     Social History   Socioeconomic History  . Marital status: Single    Spouse name: Not on file  . Number of children: 0  . Years of education: Not on file  . Highest education level: Not on file  Occupational History  . Occupation: disabled    Comment: anxiety  Social Needs  . Financial resource strain: Not very hard  . Food insecurity:    Worry: Never true    Inability: Never true  . Transportation needs:    Medical: No    Non-medical: No  Tobacco Use  . Smoking status: Current Every Day Smoker    Packs/day: 3.00    Years: 20.00    Pack years: 60.00    Types: Cigarettes  . Smokeless tobacco: Never Used  . Tobacco comment: 1 pack per week now as of 02/24/2018   Substance and Sexual Activity  . Alcohol use: Not Currently    Comment: no aclohol for 5-7 months   . Drug use: No  . Sexual activity: Not on file  Lifestyle  . Physical activity:    Days per week: Not on file    Minutes per session: Not on file  . Stress: Not on file  Relationships  . Social connections:    Talks on phone: Not on file    Gets together: Not on file    Attends religious service: Not on file    Active member of club or organization: Not on file    Attends meetings of clubs or organizations: Not on file    Relationship status: Not on file  . Intimate partner violence:    Fear of current or ex partner: No    Emotionally abused: No    Physically abused: No    Forced sexual activity: No  Other Topics Concern  . Not on file  Social History Narrative   ** Merged History Encounter **        Review of Systems: Gen: see HPI  CV: Denies chest pain, heart palpitations, syncope, edema  Resp: Denies shortness of breath with rest, cough, wheezing GI: see HPI GU : Denies urinary burning, urinary frequency, urinary incontinence.  MS: Denies joint pain,swelling, cramping Derm:  Denies rash, itching, dry  skin Psych: Denies depression, anxiety,confusion, or memory loss Heme: Denies bruising, bleeding, and enlarged lymph nodes.  Physical Exam: Vital signs in last 24 hours: Temp:  [98.9 F (37.2 C)-99.4 F (37.4 C)] 99.4 F (37.4 C) (01/14 0749) Pulse Rate:  [92-107] 105 (01/14 0749) Resp:  [12-19] 18 (01/14 0749) BP: (101-121)/(71-93) 107/82 (01/14 0749) SpO2:  [91 %-97 %] 95 % (01/14 0749) Weight:  [49 kg] 49 kg (01/13 1809)   General:   Alert, chronically ill-appearing and cachectic Head:  Normocephalic and atraumatic. Eyes:  Sclera clear, no icterus.   Conjunctiva pink. Ears:  Normal auditory acuity. Nose:  No deformity, discharge,  or lesions. Mouth:  No deformity or lesions, dentition normal. Lungs:  Clear throughout to auscultation.   Heart:  S1 S2 present without murmurs  Abdomen:  LUQ with fullness/distension but soft, TTP LUQ, upper abdomen, no rebound or guarding Rectal:  Deferred  Msk:  Symmetrical without gross deformities. Normal posture. Extremities:  With muscle wasting bilateral upper and lower extremities  Neurologic:  Alert and  oriented x4 Skin:  Intact without significant lesions or rashes. Psych:  Alert and cooperative. Flat affect  Intake/Output from previous day: 01/13 0701 - 01/14 0700 In: 1311.1 [IV Piggyback:1311.1] Out: 1000  Intake/Output this shift: No intake/output data recorded.  Lab Results: Recent Labs    02/23/18 1835 02/24/18 0509  WBC 11.6* 11.8*  HGB 11.2* 11.2*  HCT 34.7* 34.6*  PLT 621* 582*   BMET Recent Labs    02/23/18 1835 02/24/18 0509  NA 131* 136  K 2.6* 3.2*  CL 93* 94*  CO2 27 30  GLUCOSE 97 84  BUN 11 9  CREATININE 0.52* 0.64  CALCIUM 7.9* 8.3*   LFT Recent Labs    02/23/18 1835 02/24/18 0509  PROT 6.7 6.1*  ALBUMIN 2.9* 2.6*  AST 14* 10*  ALT 10 10  ALKPHOS 77 67  BILITOT 0.8 1.0    Studies/Results: Ct Abdomen Pelvis W Contrast  Result Date: 02/23/2018 CLINICAL DATA:  Ongoing abdominal pain.  Nausea and vomiting. Recent admissions for the same thing. EXAM: CT ABDOMEN AND PELVIS WITH CONTRAST TECHNIQUE: Multidetector CT imaging of the abdomen and pelvis was performed using the standard protocol following bolus administration of intravenous contrast. CONTRAST:  OMNIPAQUE IOHEXOL 300 MG/ML  SOLN COMPARISON:  02/05/2018 FINDINGS: Lower chest: Atelectasis or infiltration in the lung bases, improving since previous study. Hepatobiliary: No focal liver abnormality is seen. No gallstones, gallbladder wall thickening, or biliary dilatation. Pancreas: Hypoenhancing pancreatic parenchyma in the body and tail with pancreatic ductal dilatation and small cyst in the tail. These findings were present previously and are consistent with pancreatitis. There is persistent edema and stranding around the pancreas. Small pseudo cysts which were seen previously in the left upper quadrant have demonstrated marked enlargement since the previous study. Left upper quadrant and left abdominal pseudo cyst now measures 18.3 x 15.9 x 9.6 cm. The pseudocyst compresses the stomach and abdominal organs. Additional smaller cystic collections demonstrated anterior to the pancreatic body and tail. Splenic veins appear patent. Spleen: Normal in size without focal abnormality. Adrenals/Urinary Tract: Adrenal glands are unremarkable. Kidneys are normal, without renal calculi, focal lesion, or hydronephrosis. Bladder is unremarkable. Stomach/Bowel: Stomach is not abnormally distended but is displaced by the large left upper quadrant pseudo cyst. There is a markedly dilated bowel loop in the right upper quadrant, measuring up to about 5.8 cm in diameter. This appears to represent the proximal duodenum.  Remainder of small bowel are decompressed. Contrast material is present throughout the colon. No colonic distention or wall thickening. Appendix is not identified. Vascular/Lymphatic: Aortic atherosclerosis. No enlarged abdominal or pelvic  lymph nodes. Reproductive: Prostate gland is enlarged, measuring 5.3 cm diameter. Other: No free air or free fluid in the abdomen. Abdominal wall musculature appears intact. Musculoskeletal: Degenerative changes in the spine. Superior endplate compression with Schmorl's node at L2. No change since prior study. IMPRESSION: 1. Changes of acute pancreatitis with pancreatic ductal dilatation and small cyst in the tail. No evidence of pancreatic necrosis. 2. Marked enlargement of left upper quadrant pseudo cyst, now measuring 18 cm in maximal diameter. Mass effect from the pseudocyst causes displacement of the stomach and abdominal organs. 3. New markedly dilated bowel loop in the right upper quadrant probably representing proximal duodenum. This is consistent with obstruction, likely from extrinsic compression. 4. Enlarged prostate gland. Aortic Atherosclerosis (ICD10-I70.0). Electronically Signed   By: Burman Nieves M.D.   On: 02/23/2018 23:53   Dg Chest Portable 1 View  Result Date: 02/24/2018 CLINICAL DATA:  NG tube placement EXAM: PORTABLE CHEST 1 VIEW COMPARISON:  11/28/2017 FINDINGS: Lungs are clear.  No pleural effusion or pneumothorax. The heart is normal in size. Enteric tube terminates in the gastric cardia. IMPRESSION: Enteric tube terminates in the gastric cardia. Electronically Signed   By: Charline Bills M.D.   On: 02/24/2018 02:37    Impression: 49 year old male presenting with acute on chronic pancreatitis previously felt secondary to alcohol, now with marked enlargement of pseudocyst causing mass effect on stomach and abdominal organs. Clinically with slight improvement since NG tube placed for decompression, and pain has improved. Denies any alcohol use for 5-7 months. Cachectic-appearing and malnourished. Will need pseudocyst drainage and nutrition support. Endoscopic drainage anticipated but unable to be completed here at [facility]. Will need to review further options with Dr. Darrick Penna;  in interim, continue with fluid resuscitation, NG tube for decompression, and appreciate pending nutrition consult.  Weight loss: patient and niece describe 20 lbs lost in past few weeks, but I note he has been stable since March 2019 in review of records. However, he has lost considerable amounts over the past few years in setting of chronic illness.      Plan: Recommend PICC line for TPN Will need drainage due to symptomatic pseudocyst:endoscopically with cystogastrostomy and stent placement, which is not available at [facility].  Continue supportive measures Will continue to follow with you Discussed with patient continued avoidance of alcohol in the future Appreciate nutrition consult   Gelene Mink, PhD, ANP-BC [facility]     LOS: 0 days    02/24/2018, 1:59 PM

## 2018-02-24 NOTE — Discharge Summary (Signed)
Physician Discharge Summary  Patient ID: Brent [name] MRN: [id number] DOB/AGE: [birth year] 48 y.o.  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   Hypokalemia, likely secondary to nausea and vomiting.   Nausea and vomiting, likely secondary to obstruction as documented above.    Hyponatremia    Prolonged QTc interval likely secondary to electrolyte abnormality  Discharged Condition: stable for transfer to another institution capable of performing cyst-gastrostomy  Hospital Course: Brent [name]  is a  48 year old Caucasian male, with past medical history significant for MS, chronic pancreatitis, alcohol abuse and hypertension.  Patient presented with abdominal pain, persistent nausea vomiting and inability to keep anything down for the last 1 to 2 weeks.  Patient has had over 20 pound weight loss over past 1 month.  Not certain if patient has been abstinent from alcohol.  On presentation to the hospital, lipase level was 363 (lipase was 66 on 11/29/2017), and CT scan of abdomen and pelvis with contrast revealed changes of acute pancreatitis with marked left upper quadrant pseudocyst with mass-effect, displacing the stomach and abdominal organs.  Patient was admitted for further assessment and management.  Patient has been kept n.p.o.  Patient has NG tube tube to low suction.  PICC line will be placed, patient will be started on TPN.  Multiple electrolyte abnormality was noted on presentation, likely secondary to persistent nausea and vomiting.  Abnormal electrolytes are being repleted.  Patient is not in any obvious pain.  GI consult was called, and it has been advised that patient will need cyst gastrostomy.  Patient will need to be  transferred to a facility with such capability.  GI physician, Dr. Darrick Penna, has kindly spoken to [facility]) and patient has been accepted.  Patient will be transferred once a bed becomes available.  Consults: GI  Significant Diagnostic Studies:  CT scan of abdomen and pelvis with contrast done on admission revealed "Changes of acute pancreatitis with pancreatic ductal dilatation and small cyst in the tail. No evidence of pancreatic necrosis.  Marked enlargement of left upper quadrant pseudo cyst, now measuring 18 cm in maximal diameter. Mass effect from the pseudocyst causes displacement of the stomach and abdominal organs.  New markedly dilated bowel loop in the right upper quadrant probably representing proximal duodenum. This is consistent with obstruction, likely from extrinsic compression.  Enlarged prostate gland.  Aortic Atherosclerosis.  Discharge Exam: Blood pressure 107/82, pulse (!) 105, temperature 99.4 F (37.4 C), temperature source Oral, resp. rate 18, height 5\' 7"  (1.702 m), weight 49 kg, SpO2 95 %.  Disposition: Discharge disposition: 02-Transferred to [facility]   Discharge Instructions    Increase activity slowly   Complete by:  As directed      Allergies as of 02/24/2018   No Known Allergies     Medication List    STOP taking these medications   ALPRAZolam 1 MG tablet Commonly known as:  XANAX   CREON 3000-9500 units Cpep Generic drug:  Pancrelipase (Lip-Prot-Amyl)   HYDROcodone-acetaminophen 10-325 MG tablet Commonly known as:  NORCO   ketorolac 0.4 % Soln Commonly known as:  ACULAR   omeprazole 40 MG capsule Commonly known as:  PRILOSEC   zolpidem 10 MG tablet Commonly known as:  AMBIEN     TAKE these  medications   DULERA 100-5 MCG/ACT Aero Generic drug:  mometasone-formoterol Inhale 2 puffs into the lungs daily.   enoxaparin 40 MG/0.4ML injection Commonly known as:  LOVENOX Inject 0.4 mLs (40 mg total) into the skin  daily. Start taking on:  February 25, 2018   LORazepam 2 MG/ML injection Commonly known as:  ATIVAN Inject 0.5 mLs (1 mg total) into the vein every 6 (six) hours as needed for anxiety or sedation.   morphine 2 MG/ML injection Inject 1 mL (2 mg total) into the vein every 4 (four) hours as needed.   OXcarbazepine 150 MG tablet Commonly known as:  TRILEPTAL Take 150 mg by mouth 2 (two) times daily.   prednisoLONE acetate 1 % ophthalmic suspension Commonly known as:  PRED FORTE Place 2 drops into both eyes 2 (two) times daily.        SignedBarnetta Chapel 02/24/2018, 4:20 PM

## 2018-02-25 DIAGNOSIS — R7303 Prediabetes: Secondary | ICD-10-CM | POA: Insufficient documentation

## 2018-02-25 DIAGNOSIS — F1020 Alcohol dependence, uncomplicated: Secondary | ICD-10-CM | POA: Insufficient documentation

## 2018-02-25 LAB — RENAL FUNCTION PANEL
Albumin: 2.8 g/dL — ABNORMAL LOW (ref 3.5–5.0)
Anion gap: 18 — ABNORMAL HIGH (ref 5–15)
BUN: 11 mg/dL (ref 6–20)
CO2: 30 mmol/L (ref 22–32)
Calcium: 8.7 mg/dL — ABNORMAL LOW (ref 8.9–10.3)
Chloride: 90 mmol/L — ABNORMAL LOW (ref 98–111)
Creatinine, Ser: 0.7 mg/dL (ref 0.61–1.24)
GFR calc Af Amer: >60 mL/min (ref >60)
GFR calc non Af Amer: >60 mL/min (ref >60)
Glucose, Bld: 85 mg/dL (ref 70–99)
Phosphorus: 3.4 mg/dL (ref 2.5–4.6)
Potassium: 3.1 mmol/L — ABNORMAL LOW (ref 3.5–5.1)
Sodium: 138 mmol/L (ref 135–145)

## 2018-02-25 LAB — PREALBUMIN: Prealbumin: 10 mg/dL — ABNORMAL LOW (ref 18–38)

## 2018-02-25 LAB — MAGNESIUM: Magnesium: 2.3 mg/dL (ref 1.7–2.4)

## 2018-02-25 LAB — TRIGLYCERIDES: Triglycerides: 94 mg/dL (ref <150)

## 2018-02-25 LAB — LIPASE, BLOOD: Lipase: 97 U/L — ABNORMAL HIGH (ref 11–51)

## 2018-02-25 NOTE — Progress Notes (Signed)
Report called and given to staff at [facility] transport at bedside to transport patient.

## 2018-02-25 NOTE — Progress Notes (Signed)
Received phone call from transport services at [facility], patient has an available bed. Report to be called and transport to be arranged.

## 2018-02-27 ENCOUNTER — Encounter (HOSPITAL_COMMUNITY)
Admission: RE | Admit: 2018-02-27 | Discharge: 2018-02-27 | Disposition: A | Discharge status: Home / Self Care | Payer: Medicare Other | Source: Ambulatory Visit | Attending: Neurology | Admitting: Neurology

## 2018-03-18 ENCOUNTER — Encounter (HOSPITAL_COMMUNITY): Payer: Self-pay | Admitting: Emergency Medicine

## 2018-03-18 ENCOUNTER — Emergency Department (HOSPITAL_COMMUNITY)
Admission: EM | Admit: 2018-03-18 | Discharge: 2018-03-18 | Disposition: A | Discharge status: Home / Self Care | Payer: Medicare Other | Attending: Emergency Medicine | Admitting: Emergency Medicine

## 2018-03-18 ENCOUNTER — Emergency Department (HOSPITAL_COMMUNITY): Payer: Medicare Other

## 2018-03-18 ENCOUNTER — Other Ambulatory Visit: Payer: Self-pay

## 2018-03-18 DIAGNOSIS — I10 Essential (primary) hypertension: Secondary | ICD-10-CM | POA: Diagnosis not present

## 2018-03-18 DIAGNOSIS — Z789 Other specified health status: Secondary | ICD-10-CM | POA: Diagnosis not present

## 2018-03-18 DIAGNOSIS — F17210 Nicotine dependence, cigarettes, uncomplicated: Secondary | ICD-10-CM | POA: Insufficient documentation

## 2018-03-18 DIAGNOSIS — Z79899 Other long term (current) drug therapy: Secondary | ICD-10-CM | POA: Diagnosis not present

## 2018-03-18 NOTE — ED Provider Notes (Addendum)
[facility] EMERGENCY DEPARTMENT Provider Note   CSN: [id number] Arrival date & time: 03/18/18  1420     History   Chief Complaint Chief Complaint  Patient presents with  . GI Problem    HPI Brent [name] is a 48 y.o. male.  Patient was admitted at [facility] from January 15 until February 1.  Patient had gastric outlet obstruction determined probably secondary to acute pancreatitis and a pseudocyst.  Difficult to determine exactly on the notes but may have developed a necrotic thing but either way had a gastric Obstruction.  Did have by Dr. Mamie Laurel placement of a GJ tube so that he could receive nutritional feedings.  It was also department determined patient was severely malnourished.  Patient has a home health nurse that comes out every other day.  Today they noted that the jejunal portion of the GJ tube was not functioning and seem to be obstructed.  Patient is taking oral Jell-O and oral milkshakes and is able to take his crushed medications that way as well.  Otherwise patient without any acute problems.     Past Medical History:  Diagnosis Date  . Alcohol use   . Anxiety    Disabled due to panic attacks  . Arthritis   . Bilateral ureteral calculi   . Borderline type 2 diabetes mellitus   . BPH (benign prostatic hypertrophy)   . Chronic back pain   . Condylomata acuminata in male    multiple procedures --  penile, peri-rectal , perineum  . DDD (degenerative disc disease), cervical   . Depression   . Dyspnea on exertion   . Emphysematous COPD (HCC)   . Epiretinal membrane (ERM) of both eyes   . Fibromyalgia   . GERD (gastroesophageal reflux disease)   . Headache(784.0)   . History of chronic pancreatitis    severeal adx for this /  2008  dx alcoholic pancreatitis  . History of esophageal dilatation   . History of hiatal hernia   . History of kidney stones   . History of panic attacks   . History of sepsis    adx 05-01-2014--  urosepsis due to kidney  stones obstruction/ hydronephrosis  . History of suicidal ideation    2006   adx  . Hypertension    was on medication for short time, htn was caused by prednisone per pt  . Multiple sclerosis (HCC)    pt. states has 4 small brain lesions  . Nephrolithiasis    bilateral   . Retinal vasculitis   . Uveitis     Patient Active Problem List   Diagnosis Date Noted  . Pancreatitis 02/24/2018  . Protein-calorie malnutrition, severe 02/24/2018  . MS (multiple sclerosis) (HCC) 11/28/2017  . Acute pancreatitis 11/27/2017  . Loose stools 11/27/2017  . Acute on chronic pancreatitis (HCC) 11/27/2017  . Pancreatitis, alcoholic, acute 02/18/2016  . Acute alcoholic pancreatitis 11/07/2014  . Bilateral ureteral calculi   . UTI (lower urinary tract infection)   . Ureteral calculi 05/02/2014  . Ureterolithiasis 05/01/2014  . UTI (urinary tract infection) 05/01/2014  . Sepsis (HCC) 05/01/2014  . Hydronephrosis 05/01/2014  . Tobacco abuse 05/01/2014  . Failure to thrive (0-17) 01/22/2014  . Melena 11/03/2012  . Odynophagia 11/03/2012  . Esophageal dysphagia 11/03/2012  . ETOH abuse 08/31/2011  . Chronic back pain   . Anxiety   . Weight loss, abnormal 05/04/2010  . Epigastric pain 05/04/2010  . Rectal bleeding 05/04/2010  . Rectal pain 05/04/2010  Past Surgical History:  Procedure Laterality Date  . BIOPSY  11/12/2012   Procedure: GASTRIC AND ESOPHAGEAL BIOPSIES;  Surgeon: Corbin Ade, MD;  Location: [facility];  Service: Endoscopy;;  . CATARACT EXTRACTION W/ INTRAOCULAR LENS  IMPLANT, BILATERAL    . COLONOSCOPY  10/08/2011   Jenkins:Normal colon/Anal condyloma without extension proximal to dentate line  . CYSTOSCOPY W/ URETERAL STENT PLACEMENT Bilateral 05/01/2014   Procedure: CYSTOSCOPY WITH BILATERAL RETROGRADE PYELOGRAM; BILATERAL URETERAL STENT PLACEMENT;  Surgeon: Barron Alvine, MD;  Location: [facility];  Service: Urology;  Laterality: Bilateral;  . CYSTOSCOPY W/ URETERAL STENT REMOVAL  Bilateral 06/06/2014   Procedure: CYSTOSCOPY WITH STENT REMOVAL;  Surgeon: Marcine Matar, MD;  Location: [facility];  Service: Urology;  Laterality: Bilateral;  . CYSTOSCOPY WITH URETEROSCOPY  06/06/2014   Procedure: CYSTOSCOPY WITH URETEROSCOPY;  Surgeon: Marcine Matar, MD;  Location: [facility];  Service: Urology;;  . Bluford Kaufmann WITH URETEROSCOPY AND STENT PLACEMENT Bilateral 06/06/2014   Procedure: CYSTOSCOPY WITH J2 STENT EXTRACTION,,URETEROSCOPY WITH EXTRACTION OF STONES,;  Surgeon: Marcine Matar, MD;  Location: [facility];  Service: Urology;  Laterality: Bilateral;  . ELECTROCAUTERY/ DESICCATION OF CONDYLOMA LESIONS  01-15-2008  &  12-29-2009   PENIS, PERI-RECTAL , PERINUEM  . ESOPHAGOGASTRODUODENOSCOPY (EGD) WITH PROPOFOL N/A 11/12/2012   SLP:NPYYFR esophagus s/p  passage of a Maloney dilator and biopsy. Abnormal gastric mucosa-status post biopsy  . FLEXIBLE BRONCHOSCOPY N/A 08/12/2012   Procedure: FLEXIBLE BRONCHOSCOPY;  Surgeon: Fredirick Maudlin, MD;  Location: [facility];  Service: Pulmonary;  Laterality: N/A;  . MALONEY DILATION N/A 11/12/2012   Procedure: MALONEY DILATION (56mm);  Surgeon: Corbin Ade, MD;  Location: [facility];  Service: Endoscopy;  Laterality: N/A;  . MULTIPLE EXTRACTIONS WITH ALVEOLOPLASTY N/A 04/22/2014   Procedure: MULTIPLE EXTRACTIONS ( 2,3,5,6,7,8,9,10,11,13,14,15,21,28  WITH ALVEOLOPLASTY;  Surgeon: Ocie Doyne, DDS;  Location: [facility];  Service: Oral Surgery;  Laterality: N/A;  . WISDOM TOOTH EXTRACTION          Home Medications    Prior to Admission medications   Medication Sig Start Date End Date Taking? Authorizing Provider  albuterol (PROVENTIL HFA;VENTOLIN HFA) 108 (90 Base) MCG/ACT inhaler Inhale 1-2 puffs into the lungs every 6 (six) hours as needed for wheezing or shortness of breath.  07/25/15  Yes [provider]  ALPRAZolam Prudy Feeler) 1 MG tablet Take 1 mg by mouth 4 (four) times daily.    Yes [provider]  Calcium Carbonate-Vitamin D (CALCIUM HIGH POTENCY/VITAMIN D) 600-200 MG-UNIT TABS Take 1 tablet by mouth daily.   Yes [provider]  gabapentin (NEURONTIN) 300 MG capsule Take 600 mg by mouth 4 (four) times daily. 03/14/18 03/21/18 Yes [provider]  HYDROcodone-acetaminophen (NORCO) 10-325 MG tablet Take 1 tablet by mouth 2 (two) times daily as needed. For pain   Yes [provider]  mometasone-formoterol (DULERA) 100-5 MCG/ACT AERO Inhale 2 puffs into the lungs daily.   Yes [provider]  natalizumab (TYSABRI) 300 MG/15ML injection Inject 300 mg into the vein every 30 (thirty) days.    Yes [provider]  omeprazole (PRILOSEC) 40 MG capsule Take 40 mg by mouth at bedtime.   Yes [provider]  oxyCODONE (OXY IR/ROXICODONE) 5 MG immediate release tablet Take 5-10 mg by mouth every 4 (four) hours as needed. To take as directed every 4 hours as needed for 7 days then resume taking Hydrocodone for pain 03/14/18 03/21/18 Yes [provider]  prednisoLONE acetate (PRED FORTE)  1 % ophthalmic suspension Place 2 drops into both eyes 2 (two) times daily.  01/24/15  Yes [provider]  zolpidem (AMBIEN) 10 MG tablet Take 10 mg by mouth at bedtime as needed.   Yes [provider]  enoxaparin (LOVENOX) 40 MG/0.4ML injection Inject 0.4 mLs (40 mg total) into the skin daily. Patient not taking: Reported on 03/18/2018 02/25/18   Berton Mountgbata, Sylvester I, MD  LORazepam (ATIVAN) 2 MG/ML injection Inject 0.5 mLs (1 mg total) into the vein every 6 (six) hours as needed for anxiety or sedation. Patient not taking: Reported on 03/18/2018 02/24/18   Berton Mountgbata, Sylvester I, MD  morphine 2 MG/ML injection Inject 1 mL (2 mg total) into the vein every 4 (four) hours as needed. Patient not taking: Reported on 03/18/2018 02/24/18   Berton Mountgbata, Sylvester I, MD  OXcarbazepine (TRILEPTAL) 150 MG tablet Take 150 mg by mouth 2 (two) times  daily.    [provider]    Family History Family History  Problem Relation Age of Onset  . Hypertension Mother   . Breast cancer Mother   . Melanoma Mother   . Stroke Mother   . Lung cancer Father [id number]  . Colon cancer Neg Hx   . Colitis Neg Hx   . Cirrhosis Neg Hx   . Liver disease Neg Hx   . Pancreatic cancer Neg Hx   . Pancreatitis Neg Hx   . Anesthesia problems Neg Hx   . Hypotension Neg Hx   . Malignant hyperthermia Neg Hx   . Pseudochol deficiency Neg Hx     Social History Social History   Tobacco Use  . Smoking status: Current Every Day Smoker    Packs/day: 3.00    Years: 20.00    Pack years: 60.00    Types: Cigarettes  . Smokeless tobacco: Never Used  . Tobacco comment: 1 pack per week now as of 02/24/2018   Substance Use Topics  . Alcohol use: Not Currently    Comment: no aclohol for 5-7 months   . Drug use: No     Allergies   Patient has no known allergies.   Review of Systems Review of Systems  Constitutional: Positive for appetite change and unexpected weight change. Negative for chills and fever.  HENT: Negative for rhinorrhea and sore throat.   Eyes: Negative for visual disturbance.  Respiratory: Negative for cough and shortness of breath.   Cardiovascular: Negative for chest pain and leg swelling.  Gastrointestinal: Negative for abdominal pain, diarrhea, nausea and vomiting.  Genitourinary: Negative for dysuria.  Musculoskeletal: Negative for back pain and neck pain.  Skin: Negative for rash.  Neurological: Negative for dizziness, light-headedness and headaches.  Hematological: Does not bruise/bleed easily.  Psychiatric/Behavioral: Negative for confusion.     Physical Exam Updated Vital Signs BP 97/64 (BP Location: Right Arm)   Pulse 97   Temp 98.4 F (36.9 C) (Oral)   Resp 16   Ht 1.702 m (5\' 7" )   Wt 46.3 kg   SpO2 96%   BMI 15.98 kg/m   Physical Exam Vitals signs and nursing note reviewed.  Constitutional:       General: He is not in acute distress.    Appearance: He is well-developed. He is ill-appearing. He is not toxic-appearing.     Comments: Patient very emaciated.  Known to have a significant malnutrition.  HENT:     Head: Normocephalic and atraumatic.     Mouth/Throat:     Mouth: Mucous membranes are  moist.  Eyes:     Extraocular Movements: Extraocular movements intact.     Conjunctiva/sclera: Conjunctivae normal.     Pupils: Pupils are equal, round, and reactive to light.  Neck:     Musculoskeletal: Normal range of motion and neck supple.  Cardiovascular:     Rate and Rhythm: Normal rate and regular rhythm.     Heart sounds: No murmur.  Pulmonary:     Effort: Pulmonary effort is normal. No respiratory distress.     Breath sounds: Normal breath sounds.  Abdominal:     General: Abdomen is flat.     Palpations: Abdomen is soft.     Tenderness: There is no abdominal tenderness.     Comments: GJ tube left upper quadrant.  Upper epigastric midline incision intact no evidence of any erythema or infection.  Skin around the GJ tube appears healthy.  Musculoskeletal:     Comments: Very thin  Skin:    General: Skin is warm and dry.  Neurological:     General: No focal deficit present.     Mental Status: He is alert and oriented to person, place, and time.      ED Treatments / Results  Labs (all labs ordered are listed, but only abnormal results are displayed) Labs Reviewed - No data to display  EKG None  Radiology Dg Abd Acute W/chest  Result Date: 03/18/2018 CLINICAL DATA:  48 year old male who is gastrostomy tube will not flush. EXAM: DG ABDOMEN ACUTE W/ 1V CHEST COMPARISON:  Portable chest 02/24/2018. CT Abdomen and Pelvis 02/23/2018 and earlier. FINDINGS: AP upright view of the chest. NG tube has been removed. Mediastinal contours remain normal. Lungs appear hyperinflated with evidence of right upper lobe emphysema and stable chronic left upper lobe scarring. No pneumothorax or  pneumoperitoneum. No acute pulmonary opacity. Upright and supine views of the abdomen. No pneumoperitoneum. Left upper quadrant gastrostomy tube which extends through the duodenum and past the ligament of Treitz into the proximal jejunum. Superimposed left upper quadrant staple line. Nonobstructed but nonspecific appearing bowel gas pattern, with large pancreatic pseudocysts in the upper abdomen on last month CT. Mild scoliosis.  No acute osseous abnormality identified. IMPRESSION: 1. Percutaneous GJ tube with distal tip at the proximal jejunum level. 2. No free air. Nonobstructed but nonspecific appearing bowel gas pattern. 3. A left upper quadrant staple line is new since the CT last month at which time large pancreatic pseudocysts were demonstrated. 4. Chronic lung disease.  No acute cardiopulmonary abnormality. Electronically Signed   By: Odessa Fleming M.D.   On: 03/18/2018 18:53    Procedures Procedures (including critical care time)  Medications Ordered in ED Medications - No data to display   Initial Impression / Assessment and Plan / ED Course  I have reviewed the triage vital signs and the nursing notes.  Pertinent labs & imaging results that were available during my care of the patient were reviewed by me and considered in my medical decision making (see chart for details).  Abdominal x-rays were done the tip of the GJ tube appears to be in the right location.  The gastric portion of it flushes easily and well.  The jejunal portion seems to be occluded and will not flush.  The epigastric surgical wound and the site around the GJ tube all appears very healthy.  Patient without any other issues acutely main concern is getting the J-tube part working so that he can have his feeding.  In the meantime patient is  able to take oral Jell-O and milkshakes he is doing that already.   Contacted [facility] service for folks covering for Dr. Chestine Sporelark.  They will have him or his service contact patient  tomorrow at home to have the obstructed GJ tube corrected.  Or replaced.  Family aware.  [facility]  Patient nontoxic no acute distress.  Patient able to take some oral food as Jell-O and milkshake.  Cannot go long without his tube feedings but should be fine overnight.  Patient nontoxic no acute distress.  Patient clearly is very emaciated and has significant malnutrition.  And therefore cannot go a long period of time without his tube feedings.  [facility].    Final Clinical Impressions(s) / ED Diagnoses   Final diagnoses:  Encounter for gastrojejunal (GJ) tube placement    ED Discharge Orders    None       Vanetta MuldersZackowski, Rashae Rother, MD 03/18/18 2037    Vanetta MuldersZackowski, Candence Sease, MD 03/18/18 2038

## 2018-03-18 NOTE — Discharge Instructions (Addendum)
Contacted the surgical service at [facility] again to get Dr. Chestine Spore to call you at home tomorrow to make arrangements to get the tube fixed.  In the meantime do your oral feeds and give take your medications that way.  If they do not contact you early in the day I would recommend trying to call them.

## 2018-03-18 NOTE — ED Triage Notes (Signed)
Pt g tube will not flush, home health nurse tried for 1 1/2 hour.  Put in a couple of weeks ago

## 2018-03-18 NOTE — ED Notes (Signed)
Pt requesting something to drink, water provided after ok'd with edp

## 2018-06-17 NOTE — Addendum Note (Signed)
Encounter addended by: Joeseph Amor, RN on: 06/17/2018 11:13 AM  Actions taken: Clinical Note Signed

## 2018-06-17 NOTE — Progress Notes (Signed)
Dr. Gaynell Face aware of patient's continued non compliance with Tysabri.  Patient called yesterday 06/15/18 requesting to schedule next infusion.  Per Dr. Rolene Arbour, ok to proceed.  Will contact patient to make appointment.

## 2018-07-27 ENCOUNTER — Other Ambulatory Visit: Payer: Self-pay | Admitting: Family Medicine

## 2018-07-27 DIAGNOSIS — M545 Low back pain, unspecified: Secondary | ICD-10-CM

## 2018-08-20 ENCOUNTER — Inpatient Hospital Stay: Admission: RE | Admit: 2018-08-20 | Payer: Medicare Other | Source: Ambulatory Visit

## 2018-09-10 ENCOUNTER — Other Ambulatory Visit: Payer: Medicare Other

## 2018-09-23 ENCOUNTER — Inpatient Hospital Stay: Admission: RE | Admit: 2018-09-23 | Payer: Medicare Other | Source: Ambulatory Visit

## 2018-09-29 ENCOUNTER — Other Ambulatory Visit: Payer: Self-pay

## 2018-09-29 ENCOUNTER — Ambulatory Visit
Admission: RE | Admit: 2018-09-29 | Discharge: 2018-09-29 | Disposition: A | Discharge status: Home / Self Care | Payer: Medicare Other | Source: Ambulatory Visit | Attending: Family Medicine | Admitting: Family Medicine

## 2018-09-29 DIAGNOSIS — M545 Low back pain, unspecified: Secondary | ICD-10-CM

## 2018-09-29 MED ORDER — IOPAMIDOL (ISOVUE-M 200) INJECTION 41%
1.0000 mL | Freq: Once | INTRAMUSCULAR | Status: AC
  Administered 2018-09-29: 13:00:00 1 mL via EPIDURAL

## 2018-09-29 MED ORDER — METHYLPREDNISOLONE ACETATE 40 MG/ML INJ SUSP (RADIOLOG
120.0000 mg | Freq: Once | INTRAMUSCULAR | Status: AC
  Administered 2018-09-29: 13:00:00 120 mg via EPIDURAL

## 2018-09-29 NOTE — Discharge Instructions (Signed)

## 2018-12-17 ENCOUNTER — Telehealth (HOSPITAL_COMMUNITY): Payer: Self-pay | Admitting: *Deleted

## 2018-12-17 NOTE — Telephone Encounter (Signed)
Received TC from Mr. Schmuck stating that his MS symptoms have worsened and he would like to schedule an appointment for his Tysabri.  He has not had an infusion since July due to unforseen circumstances.  Unfortunately, he is no longer approved to receive drug from [employer], the maker of Tysabri and will need a new authorization.  Spoke to [employer] at [facility], who has been working with patient to have him seen by Dr. Pearletha Forge sooner than later.  Advised her that a new order and authorization would be needed for him to continue treatment.  States that she will discuss with physician and patient.  Mr. Hedman made aware of the above and verbalizes understanding.

## 2018-12-23 ENCOUNTER — Encounter (HOSPITAL_COMMUNITY): Payer: Self-pay

## 2018-12-23 ENCOUNTER — Other Ambulatory Visit: Payer: Self-pay

## 2018-12-23 ENCOUNTER — Encounter (HOSPITAL_COMMUNITY)
Admission: RE | Admit: 2018-12-23 | Discharge: 2018-12-23 | Disposition: A | Discharge status: Home / Self Care | Payer: Medicare Other | Source: Ambulatory Visit | Attending: Neurology | Admitting: Neurology

## 2018-12-23 DIAGNOSIS — G35 Multiple sclerosis: Secondary | ICD-10-CM | POA: Diagnosis present

## 2018-12-23 MED ORDER — SODIUM CHLORIDE 0.9 % IV SOLN
300.0000 mg | Freq: Once | INTRAVENOUS | Status: AC
  Administered 2018-12-23: 300 mg via INTRAVENOUS
  Filled 2018-12-23: qty 15

## 2018-12-23 MED ORDER — SODIUM CHLORIDE 0.9 % IV SOLN
Freq: Once | INTRAVENOUS | Status: AC
  Administered 2018-12-23: 11:00:00 via INTRAVENOUS

## 2018-12-23 MED ORDER — LORATADINE 10 MG PO TABS
10.0000 mg | ORAL_TABLET | Freq: Once | ORAL | Status: AC
  Administered 2018-12-23: 10 mg via ORAL

## 2018-12-23 MED ORDER — ACETAMINOPHEN 500 MG PO TABS
1000.0000 mg | ORAL_TABLET | Freq: Once | ORAL | Status: AC
  Administered 2018-12-23: 10:00:00 1000 mg via ORAL

## 2019-01-20 ENCOUNTER — Encounter (HOSPITAL_COMMUNITY)
Admission: RE | Admit: 2019-01-20 | Discharge: 2019-01-20 | Disposition: A | Discharge status: Home / Self Care | Payer: Medicare Other | Source: Ambulatory Visit | Attending: Neurology | Admitting: Neurology

## 2019-02-15 ENCOUNTER — Telehealth (HOSPITAL_COMMUNITY): Payer: Self-pay | Admitting: *Deleted

## 2019-02-15 NOTE — Telephone Encounter (Signed)
Patient has not been infused Tysabri since 12/23/18 and no showed December appointment.  Call attempts have been made without response.  [employer] made aware of patient's continued non compliance.

## 2019-03-23 ENCOUNTER — Encounter (HOSPITAL_COMMUNITY): Payer: Self-pay

## 2019-03-23 ENCOUNTER — Other Ambulatory Visit: Payer: Self-pay

## 2019-03-23 ENCOUNTER — Encounter (HOSPITAL_COMMUNITY)
Admission: RE | Admit: 2019-03-23 | Discharge: 2019-03-23 | Disposition: A | Discharge status: Home / Self Care | Payer: Medicare Other | Source: Ambulatory Visit | Attending: Neurology | Admitting: Neurology

## 2019-03-23 DIAGNOSIS — G35 Multiple sclerosis: Secondary | ICD-10-CM | POA: Insufficient documentation

## 2019-03-23 MED ORDER — SODIUM CHLORIDE 0.9 % IV SOLN
300.0000 mg | Freq: Once | INTRAVENOUS | Status: AC
  Administered 2019-03-23: 300 mg via INTRAVENOUS
  Filled 2019-03-23: qty 15

## 2019-03-23 MED ORDER — SODIUM CHLORIDE 0.9 % IV SOLN: Freq: Once | INTRAVENOUS | Status: AC

## 2019-03-23 MED ORDER — ACETAMINOPHEN 500 MG PO TABS
1000.0000 mg | ORAL_TABLET | Freq: Once | ORAL | Status: AC
  Administered 2019-03-23: 10:00:00 1000 mg via ORAL

## 2019-03-23 MED ORDER — LORATADINE 10 MG PO TABS
10.0000 mg | ORAL_TABLET | Freq: Once | ORAL | Status: AC
  Administered 2019-03-23: 10 mg via ORAL

## 2019-04-20 ENCOUNTER — Encounter (HOSPITAL_COMMUNITY)
Admission: RE | Admit: 2019-04-20 | Discharge: 2019-04-20 | Disposition: A | Discharge status: Home / Self Care | Payer: Medicare Other | Source: Ambulatory Visit | Attending: Neurology | Admitting: Neurology

## 2019-05-26 IMAGING — CR DG CHEST 1V PORT
1 series · 1 of 1 positions shown · non-contrast
Comparison: 11/28/2017

CLINICAL DATA: NG tube placement

EXAM:
PORTABLE CHEST 1 VIEW

[portable]
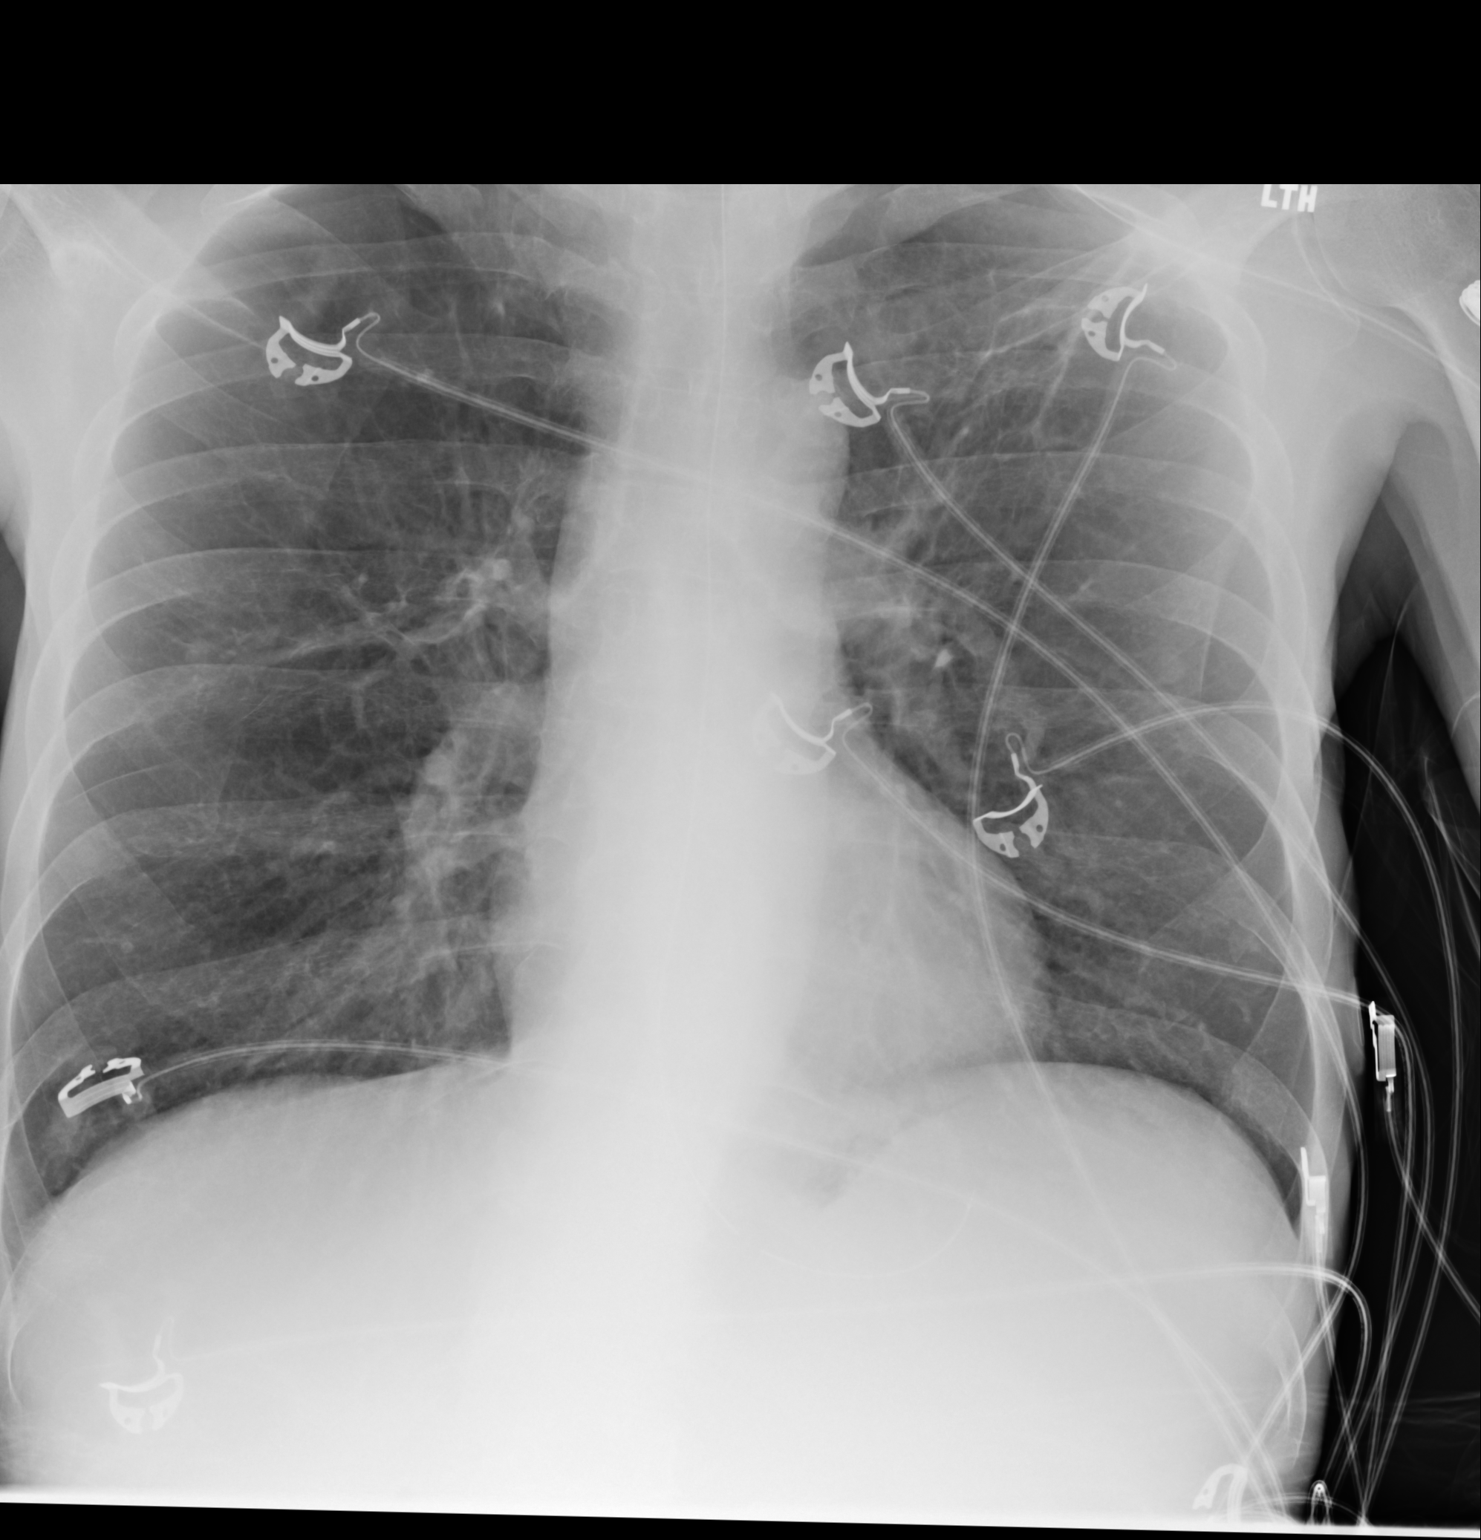

[1 of 1 positions shown; findings below may reference images not displayed]

FINDINGS: Lungs are clear.  No pleural effusion or pneumothorax.

The heart is normal in size.

Enteric tube terminates in the gastric cardia.
IMPRESSION: Enteric tube terminates in the gastric cardia.

## 2019-06-08 ENCOUNTER — Other Ambulatory Visit: Payer: Self-pay

## 2019-06-08 ENCOUNTER — Inpatient Hospital Stay (HOSPITAL_COMMUNITY)
Admission: EM | Admit: 2019-06-08 | Discharge: 2019-06-10 | DRG: 438 | Disposition: A | Discharge status: Home / Self Care | Payer: Medicare Other | Attending: Family Medicine | Admitting: Family Medicine

## 2019-06-08 ENCOUNTER — Encounter (HOSPITAL_COMMUNITY): Payer: Self-pay | Admitting: *Deleted

## 2019-06-08 DIAGNOSIS — M549 Dorsalgia, unspecified: Secondary | ICD-10-CM | POA: Diagnosis present

## 2019-06-08 DIAGNOSIS — R1310 Dysphagia, unspecified: Secondary | ICD-10-CM

## 2019-06-08 DIAGNOSIS — Z79899 Other long term (current) drug therapy: Secondary | ICD-10-CM

## 2019-06-08 DIAGNOSIS — Z720 Tobacco use: Secondary | ICD-10-CM | POA: Diagnosis present

## 2019-06-08 DIAGNOSIS — Z7952 Long term (current) use of systemic steroids: Secondary | ICD-10-CM

## 2019-06-08 DIAGNOSIS — Z7951 Long term (current) use of inhaled steroids: Secondary | ICD-10-CM

## 2019-06-08 DIAGNOSIS — F411 Generalized anxiety disorder: Secondary | ICD-10-CM | POA: Diagnosis present

## 2019-06-08 DIAGNOSIS — K219 Gastro-esophageal reflux disease without esophagitis: Secondary | ICD-10-CM | POA: Diagnosis present

## 2019-06-08 DIAGNOSIS — Z681 Body mass index (BMI) 19 or less, adult: Secondary | ICD-10-CM

## 2019-06-08 DIAGNOSIS — F17210 Nicotine dependence, cigarettes, uncomplicated: Secondary | ICD-10-CM | POA: Diagnosis present

## 2019-06-08 DIAGNOSIS — F410 Panic disorder [episodic paroxysmal anxiety] without agoraphobia: Secondary | ICD-10-CM | POA: Diagnosis present

## 2019-06-08 DIAGNOSIS — K861 Other chronic pancreatitis: Secondary | ICD-10-CM | POA: Diagnosis present

## 2019-06-08 DIAGNOSIS — N400 Enlarged prostate without lower urinary tract symptoms: Secondary | ICD-10-CM | POA: Diagnosis present

## 2019-06-08 DIAGNOSIS — E43 Unspecified severe protein-calorie malnutrition: Secondary | ICD-10-CM | POA: Diagnosis present

## 2019-06-08 DIAGNOSIS — R935 Abnormal findings on diagnostic imaging of other abdominal regions, including retroperitoneum: Secondary | ICD-10-CM

## 2019-06-08 DIAGNOSIS — G35 Multiple sclerosis: Secondary | ICD-10-CM | POA: Diagnosis present

## 2019-06-08 DIAGNOSIS — G8929 Other chronic pain: Secondary | ICD-10-CM | POA: Diagnosis present

## 2019-06-08 DIAGNOSIS — M797 Fibromyalgia: Secondary | ICD-10-CM | POA: Diagnosis present

## 2019-06-08 DIAGNOSIS — R1319 Other dysphagia: Secondary | ICD-10-CM

## 2019-06-08 DIAGNOSIS — Z20822 Contact with and (suspected) exposure to covid-19: Secondary | ICD-10-CM | POA: Diagnosis present

## 2019-06-08 DIAGNOSIS — G35D Multiple sclerosis, unspecified: Secondary | ICD-10-CM | POA: Diagnosis present

## 2019-06-08 DIAGNOSIS — F1120 Opioid dependence, uncomplicated: Secondary | ICD-10-CM | POA: Diagnosis present

## 2019-06-08 DIAGNOSIS — K859 Acute pancreatitis without necrosis or infection, unspecified: Principal | ICD-10-CM | POA: Diagnosis present

## 2019-06-08 DIAGNOSIS — K55069 Acute infarction of intestine, part and extent unspecified: Secondary | ICD-10-CM | POA: Diagnosis present

## 2019-06-08 DIAGNOSIS — D72829 Elevated white blood cell count, unspecified: Secondary | ICD-10-CM | POA: Diagnosis present

## 2019-06-08 DIAGNOSIS — I10 Essential (primary) hypertension: Secondary | ICD-10-CM | POA: Diagnosis present

## 2019-06-08 DIAGNOSIS — R1314 Dysphagia, pharyngoesophageal phase: Secondary | ICD-10-CM | POA: Diagnosis present

## 2019-06-08 LAB — CBC
HCT: 52.5 % — ABNORMAL HIGH (ref 39.0–52.0)
Hemoglobin: 17.3 g/dL — ABNORMAL HIGH (ref 13.0–17.0)
MCH: 31.6 pg (ref 26.0–34.0)
MCHC: 33 g/dL (ref 30.0–36.0)
MCV: 95.8 fL (ref 80.0–100.0)
Platelets: 369 10*3/uL (ref 150–400)
RBC: 5.48 MIL/uL (ref 4.22–5.81)
RDW: 15.1 % (ref 11.5–15.5)
WBC: 12.7 10*3/uL — ABNORMAL HIGH (ref 4.0–10.5)
nRBC: 0 % (ref 0.0–0.2)

## 2019-06-08 LAB — COMPREHENSIVE METABOLIC PANEL
ALT: 12 U/L (ref 0–44)
AST: 15 U/L (ref 15–41)
Albumin: 3.4 g/dL — ABNORMAL LOW (ref 3.5–5.0)
Alkaline Phosphatase: 94 U/L (ref 38–126)
Anion gap: 10 (ref 5–15)
BUN: 8 mg/dL (ref 6–20)
CO2: 23 mmol/L (ref 22–32)
Calcium: 8.4 mg/dL — ABNORMAL LOW (ref 8.9–10.3)
Chloride: 103 mmol/L (ref 98–111)
Creatinine, Ser: 0.61 mg/dL (ref 0.61–1.24)
GFR calc Af Amer: >60 mL/min (ref >60)
GFR calc non Af Amer: >60 mL/min (ref >60)
Glucose, Bld: 92 mg/dL (ref 70–99)
Potassium: 3.7 mmol/L (ref 3.5–5.1)
Sodium: 136 mmol/L (ref 135–145)
Total Bilirubin: 0.8 mg/dL (ref 0.3–1.2)
Total Protein: 6.5 g/dL (ref 6.5–8.1)

## 2019-06-08 LAB — LIPASE, BLOOD: Lipase: 132 U/L — ABNORMAL HIGH (ref 11–51)

## 2019-06-08 LAB — ETHANOL: Alcohol, Ethyl (B): <10 mg/dL (ref <10)

## 2019-06-08 MED ORDER — SODIUM CHLORIDE 0.9% FLUSH
3.0000 mL | Freq: Once | INTRAVENOUS | Status: AC
  Administered 2019-06-08: 3 mL via INTRAVENOUS

## 2019-06-08 NOTE — ED Notes (Signed)
Patient states that he fell last week head first on the ground and forgot to mention this in triage.

## 2019-06-08 NOTE — ED Triage Notes (Signed)
Pt with abd pain ongoing over a month, worse over last 3-4 days.  Pt vomited x 1 earlier today and was undigested food from 3 days ago per pt. + diarrhea

## 2019-06-09 ENCOUNTER — Emergency Department (HOSPITAL_COMMUNITY): Payer: Medicare Other

## 2019-06-09 DIAGNOSIS — E43 Unspecified severe protein-calorie malnutrition: Secondary | ICD-10-CM

## 2019-06-09 DIAGNOSIS — R935 Abnormal findings on diagnostic imaging of other abdominal regions, including retroperitoneum: Secondary | ICD-10-CM

## 2019-06-09 DIAGNOSIS — K859 Acute pancreatitis without necrosis or infection, unspecified: Secondary | ICD-10-CM | POA: Diagnosis present

## 2019-06-09 DIAGNOSIS — F411 Generalized anxiety disorder: Secondary | ICD-10-CM | POA: Diagnosis present

## 2019-06-09 DIAGNOSIS — Z7952 Long term (current) use of systemic steroids: Secondary | ICD-10-CM | POA: Diagnosis not present

## 2019-06-09 DIAGNOSIS — G8929 Other chronic pain: Secondary | ICD-10-CM | POA: Diagnosis present

## 2019-06-09 DIAGNOSIS — M797 Fibromyalgia: Secondary | ICD-10-CM | POA: Diagnosis present

## 2019-06-09 DIAGNOSIS — Z681 Body mass index (BMI) 19 or less, adult: Secondary | ICD-10-CM | POA: Diagnosis not present

## 2019-06-09 DIAGNOSIS — F1120 Opioid dependence, uncomplicated: Secondary | ICD-10-CM | POA: Diagnosis present

## 2019-06-09 DIAGNOSIS — F410 Panic disorder [episodic paroxysmal anxiety] without agoraphobia: Secondary | ICD-10-CM | POA: Diagnosis present

## 2019-06-09 DIAGNOSIS — M545 Low back pain: Secondary | ICD-10-CM

## 2019-06-09 DIAGNOSIS — Z79899 Other long term (current) drug therapy: Secondary | ICD-10-CM | POA: Diagnosis not present

## 2019-06-09 DIAGNOSIS — K55069 Acute infarction of intestine, part and extent unspecified: Secondary | ICD-10-CM | POA: Diagnosis present

## 2019-06-09 DIAGNOSIS — Z7951 Long term (current) use of inhaled steroids: Secondary | ICD-10-CM | POA: Diagnosis not present

## 2019-06-09 DIAGNOSIS — D72829 Elevated white blood cell count, unspecified: Secondary | ICD-10-CM | POA: Diagnosis present

## 2019-06-09 DIAGNOSIS — I10 Essential (primary) hypertension: Secondary | ICD-10-CM | POA: Diagnosis present

## 2019-06-09 DIAGNOSIS — K219 Gastro-esophageal reflux disease without esophagitis: Secondary | ICD-10-CM | POA: Diagnosis present

## 2019-06-09 DIAGNOSIS — R1310 Dysphagia, unspecified: Secondary | ICD-10-CM | POA: Diagnosis not present

## 2019-06-09 DIAGNOSIS — Z20822 Contact with and (suspected) exposure to covid-19: Secondary | ICD-10-CM | POA: Diagnosis present

## 2019-06-09 DIAGNOSIS — G35 Multiple sclerosis: Secondary | ICD-10-CM

## 2019-06-09 DIAGNOSIS — K861 Other chronic pancreatitis: Secondary | ICD-10-CM | POA: Diagnosis present

## 2019-06-09 DIAGNOSIS — R1314 Dysphagia, pharyngoesophageal phase: Secondary | ICD-10-CM | POA: Diagnosis present

## 2019-06-09 DIAGNOSIS — F17210 Nicotine dependence, cigarettes, uncomplicated: Secondary | ICD-10-CM | POA: Diagnosis present

## 2019-06-09 DIAGNOSIS — Z720 Tobacco use: Secondary | ICD-10-CM | POA: Diagnosis not present

## 2019-06-09 DIAGNOSIS — N400 Enlarged prostate without lower urinary tract symptoms: Secondary | ICD-10-CM | POA: Diagnosis present

## 2019-06-09 LAB — RAPID URINE DRUG SCREEN, HOSP PERFORMED
Amphetamines: NOT DETECTED
Barbiturates: NOT DETECTED
Benzodiazepines: POSITIVE — AB
Cocaine: NOT DETECTED
Opiates: POSITIVE — AB
Tetrahydrocannabinol: NOT DETECTED

## 2019-06-09 LAB — CREATININE, SERUM
Creatinine, Ser: 0.59 mg/dL — ABNORMAL LOW (ref 0.61–1.24)
GFR calc Af Amer: >60 mL/min (ref >60)
GFR calc non Af Amer: >60 mL/min (ref >60)

## 2019-06-09 LAB — CBC
HCT: 45.7 % (ref 39.0–52.0)
Hemoglobin: 14.8 g/dL (ref 13.0–17.0)
MCH: 31.6 pg (ref 26.0–34.0)
MCHC: 32.4 g/dL (ref 30.0–36.0)
MCV: 97.4 fL (ref 80.0–100.0)
Platelets: 318 10*3/uL (ref 150–400)
RBC: 4.69 MIL/uL (ref 4.22–5.81)
RDW: 15.2 % (ref 11.5–15.5)
WBC: 7.6 10*3/uL (ref 4.0–10.5)
nRBC: 0 % (ref 0.0–0.2)

## 2019-06-09 LAB — HEMOGLOBIN A1C
Hgb A1c MFr Bld: 5.2 % (ref 4.8–5.6)
Mean Plasma Glucose: 102.54 mg/dL

## 2019-06-09 LAB — URINALYSIS, ROUTINE W REFLEX MICROSCOPIC
Bilirubin Urine: NEGATIVE
Glucose, UA: NEGATIVE mg/dL
Hgb urine dipstick: NEGATIVE
Ketones, ur: NEGATIVE mg/dL
Leukocytes,Ua: NEGATIVE
Nitrite: NEGATIVE
Protein, ur: NEGATIVE mg/dL
Specific Gravity, Urine: 1.009 (ref 1.005–1.030)
pH: 6 (ref 5.0–8.0)

## 2019-06-09 LAB — RESPIRATORY PANEL BY RT PCR (FLU A&B, COVID)
Influenza A by PCR: NEGATIVE
Influenza B by PCR: NEGATIVE
SARS Coronavirus 2 by RT PCR: NEGATIVE

## 2019-06-09 LAB — HIV ANTIBODY (ROUTINE TESTING W REFLEX): HIV Screen 4th Generation wRfx: NONREACTIVE

## 2019-06-09 MED ORDER — IOHEXOL 300 MG/ML  SOLN
100.0000 mL | Freq: Once | INTRAMUSCULAR | Status: AC | PRN
  Administered 2019-06-09: 100 mL via INTRAVENOUS

## 2019-06-09 MED ORDER — ZOLPIDEM TARTRATE 5 MG PO TABS
5.0000 mg | ORAL_TABLET | Freq: Every evening | ORAL | Status: DC | PRN
  Administered 2019-06-09: 5 mg via ORAL
  Filled 2019-06-09: qty 1

## 2019-06-09 MED ORDER — MOMETASONE FURO-FORMOTEROL FUM 100-5 MCG/ACT IN AERO
2.0000 | INHALATION_SPRAY | Freq: Two times a day (BID) | RESPIRATORY_TRACT | Status: DC
  Administered 2019-06-09 – 2019-06-10 (×2): 2 via RESPIRATORY_TRACT
  Filled 2019-06-09: qty 8.8

## 2019-06-09 MED ORDER — MORPHINE SULFATE (PF) 4 MG/ML IV SOLN
4.0000 mg | Freq: Once | INTRAVENOUS | Status: AC
  Administered 2019-06-09: 4 mg via INTRAVENOUS
  Filled 2019-06-09: qty 1

## 2019-06-09 MED ORDER — ONDANSETRON HCL 4 MG/2ML IJ SOLN
4.0000 mg | Freq: Once | INTRAMUSCULAR | Status: AC
  Administered 2019-06-09: 4 mg via INTRAVENOUS
  Filled 2019-06-09: qty 2

## 2019-06-09 MED ORDER — FENTANYL CITRATE (PF) 100 MCG/2ML IJ SOLN
50.0000 ug | Freq: Once | INTRAMUSCULAR | Status: AC
  Administered 2019-06-09: 50 ug via INTRAVENOUS
  Filled 2019-06-09: qty 2

## 2019-06-09 MED ORDER — SODIUM CHLORIDE 0.9 % IV BOLUS
1000.0000 mL | Freq: Once | INTRAVENOUS | Status: AC
  Administered 2019-06-09: 1000 mL via INTRAVENOUS

## 2019-06-09 MED ORDER — SODIUM CHLORIDE 0.9 % IV BOLUS
500.0000 mL | Freq: Once | INTRAVENOUS | Status: AC
  Administered 2019-06-09: 500 mL via INTRAVENOUS

## 2019-06-09 MED ORDER — SODIUM CHLORIDE 0.9 % IV SOLN: 300.0000 mg | Freq: Once | INTRAVENOUS | Status: DC

## 2019-06-09 MED ORDER — ONDANSETRON HCL 4 MG/2ML IJ SOLN
4.0000 mg | Freq: Four times a day (QID) | INTRAMUSCULAR | Status: DC | PRN
  Administered 2019-06-10 (×2): 4 mg via INTRAVENOUS
  Filled 2019-06-09 (×2): qty 2

## 2019-06-09 MED ORDER — HYDROMORPHONE HCL 1 MG/ML IJ SOLN
0.5000 mg | INTRAMUSCULAR | Status: DC | PRN
  Administered 2019-06-09 – 2019-06-10 (×6): 0.5 mg via INTRAVENOUS
  Filled 2019-06-09 (×6): qty 0.5

## 2019-06-09 MED ORDER — ALPRAZOLAM 0.5 MG PO TABS
0.5000 mg | ORAL_TABLET | Freq: Four times a day (QID) | ORAL | Status: DC | PRN
  Administered 2019-06-09 – 2019-06-10 (×3): 0.5 mg via ORAL
  Filled 2019-06-09 (×3): qty 1

## 2019-06-09 MED ORDER — ENOXAPARIN SODIUM 40 MG/0.4ML ~~LOC~~ SOLN
40.0000 mg | SUBCUTANEOUS | Status: DC
  Administered 2019-06-09 – 2019-06-10 (×2): 40 mg via SUBCUTANEOUS
  Filled 2019-06-09 (×2): qty 0.4

## 2019-06-09 MED ORDER — ACETAMINOPHEN 500 MG PO TABS: 1000.0000 mg | ORAL_TABLET | Freq: Once | ORAL | Status: DC

## 2019-06-09 MED ORDER — SODIUM CHLORIDE 0.9 % IV SOLN: INTRAVENOUS | Status: DC

## 2019-06-09 MED ORDER — ONDANSETRON HCL 4 MG PO TABS: 4.0000 mg | ORAL_TABLET | Freq: Four times a day (QID) | ORAL | Status: DC | PRN

## 2019-06-09 MED ORDER — KETOROLAC TROMETHAMINE 0.5 % OP SOLN
2.0000 [drp] | Freq: Four times a day (QID) | OPHTHALMIC | Status: DC
  Administered 2019-06-09 (×2): 2 [drp] via OPHTHALMIC
  Filled 2019-06-09: qty 3

## 2019-06-09 MED ORDER — PANTOPRAZOLE SODIUM 40 MG IV SOLR
40.0000 mg | INTRAVENOUS | Status: DC
  Administered 2019-06-09 – 2019-06-10 (×2): 40 mg via INTRAVENOUS
  Filled 2019-06-09 (×2): qty 40

## 2019-06-09 MED ORDER — LORATADINE 10 MG PO TABS: 10.0000 mg | ORAL_TABLET | Freq: Once | ORAL | Status: DC

## 2019-06-09 MED ORDER — NICOTINE 21 MG/24HR TD PT24
21.0000 mg | MEDICATED_PATCH | Freq: Every day | TRANSDERMAL | Status: DC
  Administered 2019-06-09: 21 mg via TRANSDERMAL
  Filled 2019-06-09 (×2): qty 1

## 2019-06-09 MED ORDER — SODIUM CHLORIDE 0.9 % IV SOLN: Freq: Once | INTRAVENOUS | Status: DC

## 2019-06-09 MED ORDER — PREDNISOLONE ACETATE 1 % OP SUSP
2.0000 [drp] | Freq: Two times a day (BID) | OPHTHALMIC | Status: DC
  Administered 2019-06-09 (×2): 2 [drp] via OPHTHALMIC
  Filled 2019-06-09: qty 1

## 2019-06-09 NOTE — ED Notes (Signed)
ED TO INPATIENT HANDOFF REPORT  ED Nurse Name and Phone #:   S Name/Age/Gender Brent [name] 49 y.o. male Room/Bed: APA02/APA02  Code Status   Code Status: Prior  Home/SNF/Other Home Patient oriented to: self, place, time and situation Is this baseline? Yes   Triage Complete: Triage complete  Chief Complaint Acute pancreatitis [K85.90]  Triage Note Pt with abd pain ongoing over a month, worse over last 3-4 days.  Pt vomited x 1 earlier today and was undigested food from 3 days ago per pt. + diarrhea    Allergies No Known Allergies  Level of Care/Admitting Diagnosis ED Disposition    ED Disposition Condition Comment   Admit  Hospital Area: [facility] [[id number]]  Level of Care: Med-Surg [16]  Covid Evaluation: Confirmed COVID Negative  Diagnosis: Acute pancreatitis [577.0.ICD-9-CM]  Admitting Physician: Cleora Fleet [[id number]]  Attending Physician: Cleora Fleet [[id number]]  Estimated length of stay: past midnight tomorrow  Certification:: I certify this patient will need inpatient services for at least 2 midnights       B Medical/Surgery History Past Medical History:  Diagnosis Date  . Alcohol use   . Anxiety    Disabled due to panic attacks  . Arthritis   . Bilateral ureteral calculi   . Borderline type 2 diabetes mellitus   . BPH (benign prostatic hypertrophy)   . Chronic back pain   . Condylomata acuminata in male    multiple procedures --  penile, peri-rectal , perineum  . DDD (degenerative disc disease), cervical   . Depression   . Dyspnea on exertion   . Emphysematous COPD (HCC)   . Epiretinal membrane (ERM) of both eyes   . Fibromyalgia   . GERD (gastroesophageal reflux disease)   . Headache(784.0)   . History of chronic pancreatitis    severeal adx for this /  2008  dx alcoholic pancreatitis  . History of esophageal dilatation   . History of hiatal hernia   . History of kidney stones   . History of panic attacks   . History  of sepsis    adx 05-01-2014--  urosepsis due to kidney stones obstruction/ hydronephrosis  . History of suicidal ideation    2006   adx  . Hypertension    was on medication for short time, htn was caused by prednisone per pt  . Multiple sclerosis (HCC)    pt. states has 4 small brain lesions  . Nephrolithiasis    bilateral   . Retinal vasculitis   . Uveitis    Past Surgical History:  Procedure Laterality Date  . BIOPSY  11/12/2012   Procedure: GASTRIC AND ESOPHAGEAL BIOPSIES;  Surgeon: Corbin Ade, MD;  Location: [facility];  Service: Endoscopy;;  . CATARACT EXTRACTION W/ INTRAOCULAR LENS  IMPLANT, BILATERAL    . COLONOSCOPY  10/08/2011   Jenkins:Normal colon/Anal condyloma without extension proximal to dentate line  . CYSTOSCOPY W/ URETERAL STENT PLACEMENT Bilateral 05/01/2014   Procedure: CYSTOSCOPY WITH BILATERAL RETROGRADE PYELOGRAM; BILATERAL URETERAL STENT PLACEMENT;  Surgeon: Barron Alvine, MD;  Location: [facility];  Service: Urology;  Laterality: Bilateral;  . CYSTOSCOPY W/ URETERAL STENT REMOVAL Bilateral 06/06/2014   Procedure: CYSTOSCOPY WITH STENT REMOVAL;  Surgeon: Marcine Matar, MD;  Location: [facility];  Service: Urology;  Laterality: Bilateral;  . CYSTOSCOPY WITH URETEROSCOPY  06/06/2014   Procedure: CYSTOSCOPY WITH URETEROSCOPY;  Surgeon: Marcine Matar, MD;  Location: [facility];  Service: Urology;;  . Bluford Kaufmann WITH URETEROSCOPY  AND STENT PLACEMENT Bilateral 06/06/2014   Procedure: CYSTOSCOPY WITH J2 STENT EXTRACTION,,URETEROSCOPY WITH EXTRACTION OF STONES,;  Surgeon: Marcine Matar, MD;  Location: [facility];  Service: Urology;  Laterality: Bilateral;  . ELECTROCAUTERY/ DESICCATION OF CONDYLOMA LESIONS  01-15-2008  &  12-29-2009   PENIS, PERI-RECTAL , PERINUEM  . ESOPHAGOGASTRODUODENOSCOPY (EGD) WITH PROPOFOL N/A 11/12/2012   FTD:DUKGUR esophagus s/p  passage of a Maloney dilator and biopsy. Abnormal gastric  mucosa-status post biopsy  . FLEXIBLE BRONCHOSCOPY N/A 08/12/2012   Procedure: FLEXIBLE BRONCHOSCOPY;  Surgeon: Fredirick Maudlin, MD;  Location: [facility];  Service: Pulmonary;  Laterality: N/A;  . MALONEY DILATION N/A 11/12/2012   Procedure: MALONEY DILATION (58mm);  Surgeon: Corbin Ade, MD;  Location: [facility];  Service: Endoscopy;  Laterality: N/A;  . MULTIPLE EXTRACTIONS WITH ALVEOLOPLASTY N/A 04/22/2014   Procedure: MULTIPLE EXTRACTIONS ( 2,3,5,6,7,8,9,10,11,13,14,15,21,28  WITH ALVEOLOPLASTY;  Surgeon: Ocie Doyne, DDS;  Location: [facility];  Service: Oral Surgery;  Laterality: N/A;  . WISDOM TOOTH EXTRACTION       A IV Location/Drains/Wounds Patient Lines/Drains/Airways Status   Active Line/Drains/Airways    Name:   Placement date:   Placement time:   Site:   Days:   Peripheral IV 06/08/19 Right Forearm   06/08/19    2248    Forearm   1          Intake/Output Last 24 hours  Intake/Output Summary (Last 24 hours) at 06/09/2019 1322 Last data filed at 06/09/2019 1242 Gross per 24 hour  Intake 2500 ml  Output --  Net 2500 ml    Labs/Imaging Results for orders placed or performed during the hospital encounter of 06/08/19 (from the past 48 hour(s))  Lipase, blood     Status: Abnormal   Collection Time: 06/08/19  9:22 PM  Result Value Ref Range   Lipase 132 (H) 11 - 51 U/L    Comment: Performed at [facility], [address] [place], [place] [address]  Comprehensive metabolic panel     Status: Abnormal   Collection Time: 06/08/19  9:22 PM  Result Value Ref Range   Sodium 136 135 - 145 mmol/L   Potassium 3.7 3.5 - 5.1 mmol/L   Chloride 103 98 - 111 mmol/L   CO2 23 22 - 32 mmol/L   Glucose, Bld 92 70 - 99 mg/dL    Comment: Glucose reference range applies only to samples taken after fasting for at least 8 hours.   BUN 8 6 - 20 mg/dL   Creatinine, Ser 2.37 0.61 - 1.24 mg/dL   Calcium 8.4 (L) 8.9 - 10.3 mg/dL   Total Protein 6.5 6.5 - 8.1 g/dL   Albumin 3.4 (L) 3.5 - 5.0 g/dL    AST 15 15 - 41 U/L   ALT 12 0 - 44 U/L   Alkaline Phosphatase 94 38 - 126 U/L   Total Bilirubin 0.8 0.3 - 1.2 mg/dL   GFR calc non Af Amer >60 >60 mL/min   GFR calc Af Amer >60 >60 mL/min   Anion gap 10 5 - 15    Comment: Performed at [facility], [address] [place], [place] [address]  CBC     Status: Abnormal   Collection Time: 06/08/19  9:22 PM  Result Value Ref Range   WBC 12.7 (H) 4.0 - 10.5 K/uL   RBC 5.48 4.22 - 5.81 MIL/uL   Hemoglobin 17.3 (H) 13.0 - 17.0 g/dL   HCT 51.7 (H) 61.6 - 07.3 %  MCV 95.8 80.0 - 100.0 fL   MCH 31.6 26.0 - 34.0 pg   MCHC 33.0 30.0 - 36.0 g/dL   RDW 11.9 14.7 - 82.9 %   Platelets 369 150 - 400 K/uL   nRBC 0.0 0.0 - 0.2 %    Comment: Performed at [facility], [address] [place], [place] [address]  Ethanol     Status: None   Collection Time: 06/08/19  9:22 PM  Result Value Ref Range   Alcohol, Ethyl (B) <10 <10 mg/dL    Comment: (NOTE) Lowest detectable limit for serum alcohol is 10 mg/dL. For medical purposes only. Performed at [facility], [address] [place], [place] [address]   Respiratory Panel by RT PCR (Flu A&B, Covid) - Nasopharyngeal Swab     Status: None   Collection Time: 06/09/19 12:26 AM   Specimen: Nasopharyngeal Swab  Result Value Ref Range   SARS Coronavirus 2 by RT PCR NEGATIVE NEGATIVE    Comment: (NOTE) SARS-CoV-2 target nucleic acids are NOT DETECTED. The SARS-CoV-2 RNA is generally detectable in upper respiratoy specimens during the acute phase of infection. The lowest concentration of SARS-CoV-2 viral copies this assay can detect is 131 copies/mL. A negative result does not preclude SARS-Cov-2 infection and should not be used as the sole basis for treatment or other patient management decisions. A negative result may occur with  improper specimen collection/handling, submission of specimen other than nasopharyngeal swab, presence of viral mutation(s) within the areas targeted by this assay, and  inadequate number of viral copies (<131 copies/mL). A negative result must be combined with clinical observations, patient history, and epidemiological information. The expected result is Negative. Fact Sheet for Patients:  https://www.moore.com/ Fact Sheet for Healthcare Providers:  https://www.young.biz/ This test is not yet ap proved or cleared by the [place] FDA and  has been authorized for detection and/or diagnosis of SARS-CoV-2 by FDA under an Emergency Use Authorization (EUA). This EUA will remain  in effect (meaning this test can be used) for the duration of the COVID-19 declaration under Section 564(b)(1) of the Act, 21 U.S.C. section 360bbb-3(b)(1), unless the authorization is terminated or revoked sooner.    Influenza A by PCR NEGATIVE NEGATIVE   Influenza B by PCR NEGATIVE NEGATIVE    Comment: (NOTE) The Xpert Xpress SARS-CoV-2/FLU/RSV assay is intended as an aid in  the diagnosis of influenza from Nasopharyngeal swab specimens and  should not be used as a sole basis for treatment. Nasal washings and  aspirates are unacceptable for Xpert Xpress SARS-CoV-2/FLU/RSV  testing. Fact Sheet for Patients: https://www.moore.com/ Fact Sheet for Healthcare Providers: https://www.young.biz/ This test is not yet approved or cleared by the [place] FDA and  has been authorized for detection and/or diagnosis of SARS-CoV-2 by  FDA under an Emergency Use Authorization (EUA). This EUA will remain  in effect (meaning this test can be used) for the duration of the  Covid-19 declaration under Section 564(b)(1) of the Act, 21  U.S.C. section 360bbb-3(b)(1), unless the authorization is  terminated or revoked. Performed at [facility], [address], [place], [place] [address]   Urinalysis, Routine w reflex microscopic     Status: None   Collection Time: 06/09/19  4:01 AM  Result Value Ref Range    Color, Urine YELLOW YELLOW   APPearance CLEAR CLEAR   Specific Gravity, Urine 1.009 1.005 - 1.030   pH 6.0 5.0 - 8.0   Glucose, UA NEGATIVE NEGATIVE mg/dL   Hgb urine dipstick NEGATIVE  NEGATIVE   Bilirubin Urine NEGATIVE NEGATIVE   Ketones, ur NEGATIVE NEGATIVE mg/dL   Protein, ur NEGATIVE NEGATIVE mg/dL   Nitrite NEGATIVE NEGATIVE   Leukocytes,Ua NEGATIVE NEGATIVE    Comment: Performed at [facility], [address] [place], [place] [address]  Urine rapid drug screen (hosp performed)     Status: Abnormal   Collection Time: 06/09/19  4:01 AM  Result Value Ref Range   Opiates POSITIVE (A) NONE DETECTED   Cocaine NONE DETECTED NONE DETECTED   Benzodiazepines POSITIVE (A) NONE DETECTED   Amphetamines NONE DETECTED NONE DETECTED   Tetrahydrocannabinol NONE DETECTED NONE DETECTED   Barbiturates NONE DETECTED NONE DETECTED    Comment: (NOTE) DRUG SCREEN FOR MEDICAL PURPOSES ONLY.  IF CONFIRMATION IS NEEDED FOR ANY PURPOSE, NOTIFY LAB WITHIN 5 DAYS. LOWEST DETECTABLE LIMITS FOR URINE DRUG SCREEN Drug Class                     Cutoff (ng/mL) Amphetamine and metabolites    1000 Barbiturate and metabolites    200 Benzodiazepine                 200 Tricyclics and metabolites     300 Opiates and metabolites        300 Cocaine and metabolites        300 THC                            50 Performed at [facility], [address] [place], [place] [address]    CT Head Wo Contrast  Result Date: 06/09/2019 CLINICAL DATA:  Status post trauma. EXAM: CT HEAD WITHOUT CONTRAST TECHNIQUE: Contiguous axial images were obtained from the base of the skull through the vertex without intravenous contrast. COMPARISON:  None. FINDINGS: Brain: No evidence of acute infarction, hemorrhage, hydrocephalus, extra-axial collection or mass lesion/mass effect. Vascular: No hyperdense vessel or unexpected calcification. Skull: Normal. Negative for fracture or focal lesion. Sinuses/Orbits: There is marked severity  left maxillary sinus mucosal thickening. Mild to moderate severity left ethmoid sinus and very mild left-sided frontal sinus mucosal thickening is also seen. Other: None. IMPRESSION: 1. No acute intracranial abnormality. 2. Left maxillary sinus, left ethmoid sinus and left-sided frontal sinus disease. Electronically Signed   By: Aram Candela M.D.   On: 06/09/2019 03:17   CT Cervical Spine Wo Contrast  Result Date: 06/09/2019 CLINICAL DATA:  Status post trauma. EXAM: CT CERVICAL SPINE WITHOUT CONTRAST TECHNIQUE: Multidetector CT imaging of the cervical spine was performed without intravenous contrast. Multiplanar CT image reconstructions were also generated. COMPARISON:  None. FINDINGS: Alignment: Normal. Skull base and vertebrae: No acute fracture. No primary bone lesion or focal pathologic process. Soft tissues and spinal canal: No prevertebral fluid or swelling. No visible canal hematoma. Disc levels: Mild to moderate severity endplate sclerosis is seen at the levels of C5-C6 and C6-C7. Moderate severity intervertebral disc space narrowing is also seen at these levels. Mild bilateral multilevel facet joint hypertrophy is seen. Upper chest: Negative. Other: None. IMPRESSION: 1. No acute osseous abnormality. 2. Mild to moderate severity degenerative changes at the levels of C5-C6 and C6-C7. Electronically Signed   By: Aram Candela M.D.   On: 06/09/2019 03:22   CT Abdomen Pelvis W Contrast  Result Date: 06/09/2019 CLINICAL DATA:  Abdominal pain for over a month, worse over the last 3-4 days. EXAM: CT ABDOMEN AND PELVIS WITH CONTRAST TECHNIQUE: Multidetector CT imaging of the  abdomen and pelvis was performed using the standard protocol following bolus administration of intravenous contrast. CONTRAST:  151mL OMNIPAQUE IOHEXOL 300 MG/ML  SOLN COMPARISON:  May 13, 2019 FINDINGS: Lower chest: Mild areas of scarring and/or atelectasis are seen within the bilateral lung bases, right greater than left.  Hepatobiliary: No focal liver abnormality is seen. No gallstones, gallbladder wall thickening, or biliary dilatation. Pancreas: The pancreatic duct measures approximately 3.5 mm. A very mild amount of peripancreatic inflammatory fat stranding is seen within the region inferior to the junction of the body and tail of the pancreas. Spleen: Normal in size without focal abnormality. Adrenals/Urinary Tract: Adrenal glands are unremarkable. Kidneys are normal, without renal calculi, focal lesion, or hydronephrosis. Bladder is unremarkable. Stomach/Bowel: Stomach is within normal limits. The appendix is not identified. No evidence of bowel wall thickening, distention, or inflammatory changes. Vascular/Lymphatic: There is marked severity calcification of the abdominal aorta without evidence of aneurysmal dilatation or dissection. A thin, linear area of intraluminal low attenuation is seen within the mid and distal portions of the inferior mesenteric vein. This is surrounded by normal intraluminal contrast enhancement and extends to its level of anastomosis within the portal venous system. (Axial CT image 26 through 46, CT series number 2/coronal reformatted images 26 through 39, CT series number 5). This represents a new finding when compared to the prior study. No enlarged abdominal or pelvic lymph nodes. Reproductive: The prostate gland is mildly enlarged. Other: No abdominal wall hernia or abnormality. No abdominopelvic ascites. Musculoskeletal: Marked severity degenerative changes are seen at the level of L4-L5 with a chronic deformity is seen along the superior endplate of the L2 vertebral body. IMPRESSION: 1. Very mild amount of peripancreatic inflammatory fat stranding, as described above, which may represent mild acute pancreatitis. Correlation with pancreatic enzymes is recommended. 2. Findings worrisome for partial intraluminal thrombus within the mid and distal portions of the inferior mesenteric vein. Further  correlation with ultrasound of the abdomen with venous duplex is recommended. 3. Marked severity degenerative changes at the level of L4-L5 with a chronic deformity along the superior endplate of the L2 vertebral body. 4. Aortic atherosclerosis. Aortic Atherosclerosis (ICD10-I70.0). Electronically Signed   By: Virgina Norfolk M.D.   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 EXAM: DOPPLER ULTRASOUND OF THE abdominal venous structures. TECHNIQUE: Color and spectral Doppler ultrasound was utilized to evaluate blood flow of several abdominal venous structures. COMPARISON:  CT of same day. FINDINGS: This exam is limited due to overlying bowel gas. The visualized portion of IVC appears to be widely patent. There does appear to be blood flow within the proximal portion of the inferior mesenteric vein. However, no flow is noted in its middle and distal portions suggesting the possibility of thrombus. IMPRESSION: Exam is limited due to overlying bowel gas. Visualized portion of IVC is widely patent. Doppler does not demonstrate definite flow within the middle and distal portions of the inferior mesenteric vein suggesting the possibility of thrombosis. Electronically Signed   By: Marijo Conception M.D.   On: 06/09/2019 10:23    Pending Labs [employer] (From admission, onward)    Start     Ordered   Signed and Held  HIV Antibody (routine testing w rflx)  (HIV Antibody (Routine testing w reflex) panel)  Once,   R     Signed and Held   Signed and Held  CBC  (  enoxaparin (LOVENOX)    CrCl >/= 30 ml/min)  Once,   R    Comments: Baseline for enoxaparin therapy IF NOT ALREADY DRAWN.  Notify MD if PLT < 100 K.    Signed and Held   Signed and Held  Creatinine, serum  (enoxaparin (LOVENOX)    CrCl >/= 30 ml/min)  Once,   R    Comments: Baseline for enoxaparin therapy IF NOT ALREADY DRAWN.     Signed and Held   Signed and Held  Creatinine, serum  (enoxaparin (LOVENOX)    CrCl >/= 30 ml/min)  Weekly,   R    Comments: while on enoxaparin therapy    Signed and Held   Signed and Held  Comprehensive metabolic panel  Daily,   R     Signed and Held   Signed and Held  Magnesium  Daily,   R     Signed and Held   Signed and Held  CBC WITH DIFFERENTIAL  Daily,   R     Signed and Held   Signed and Held  Lipase, blood  Daily,   R     Signed and Held   Signed and Held  Hemoglobin A1c  Add-on,   R     Signed and Held          Vitals/Pain Today's Vitals   06/09/19 1100 06/09/19 1130 06/09/19 1200 06/09/19 1317  BP: 116/81 104/78 100/76 99/75  Pulse: 66 62  70  Resp:      Temp:    97.9 F (36.6 C)  TempSrc:    Oral  SpO2: 91% 91%  94%  Weight:      Height:      PainSc:        Isolation Precautions No active isolations  Medications Medications  0.9 %  sodium chloride infusion ( Intravenous New Bag/Given 06/09/19 1242)  sodium chloride flush (NS) 0.9 % injection 3 mL (3 mLs Intravenous Given 06/08/19 2247)  sodium chloride 0.9 % bolus 1,000 mL (0 mLs Intravenous Stopped 06/09/19 0127)  sodium chloride 0.9 % bolus 500 mL (0 mLs Intravenous Stopped 06/09/19 0125)  fentaNYL (SUBLIMAZE) injection 50 mcg (50 mcg Intravenous Given 06/09/19 0019)  ondansetron (ZOFRAN) injection 4 mg (4 mg Intravenous Given 06/09/19 0017)  fentaNYL (SUBLIMAZE) injection 50 mcg (50 mcg Intravenous Given 06/09/19 0125)  iohexol (OMNIPAQUE) 300 MG/ML solution 100 mL (100 mLs Intravenous Contrast Given 06/09/19 0244)  fentaNYL (SUBLIMAZE) injection 50 mcg (50 mcg Intravenous Given 06/09/19 0400)  ondansetron (ZOFRAN) injection 4 mg (4 mg Intravenous Given 06/09/19 0400)  morphine 4 MG/ML injection 4 mg (4 mg Intravenous Given 06/09/19 0509)  morphine 4 MG/ML injection 4 mg (4 mg Intravenous Given 06/09/19 0810)    Mobility walks Low fall risk   Focused Assessments    R Recommendations: See Admitting  Provider Note  Report given to:   Additional Notes:

## 2019-06-09 NOTE — ED Provider Notes (Signed)
[facility] EMERGENCY DEPARTMENT Provider Note   CSN: [id number] Arrival date & time: 06/08/19  2012   Time seen 11:50 PM  History Chief Complaint  Patient presents with  . Abdominal Pain    Brent [name] is a 49 y.o. male.  HPI   Patient states he had an intestinal blockage about a year ago that was treated at [facility] with surgery.  He states about a month ago he slowly started getting worse and it escalated over the past week.  He states he has vomited about twice a day and it is mainly undigested food.  He states that generally happens 1 to 3 hours after eating.  He has had some mild diarrhea.  He denies abdominal distention.  He states he has pain now in his whole left abdomen but it is worse in the left upper quadrant.  He denies any fever.  He denies any alcohol use in the past year  Patient states about a week ago he was playing hide and seek with his daughter and he was in the top bunk of a bunk bed and he fell off hitting the top of his head on a cement floor that had carpet on it.  He states his daughter told him he was knocked unconscious.  He states he normally has bad vision, he has some mild neck pain.  He states he has some numbness and tingling of both hands and both feet with the left being worse than the right.  He also feels like he is having some memory loss.  PCP Gareth Morgan, MD GI [facility] Dr Chestine Spore  Past Medical History:  Diagnosis Date  . Alcohol use   . Anxiety    Disabled due to panic attacks  . Arthritis   . Bilateral ureteral calculi   . Borderline type 2 diabetes mellitus   . BPH (benign prostatic hypertrophy)   . Chronic back pain   . Condylomata acuminata in male    multiple procedures --  penile, peri-rectal , perineum  . DDD (degenerative disc disease), cervical   . Depression   . Dyspnea on exertion   . Emphysematous COPD (HCC)   . Epiretinal membrane (ERM) of both eyes   . Fibromyalgia   . GERD (gastroesophageal reflux disease)    . Headache(784.0)   . History of chronic pancreatitis    severeal adx for this /  2008  dx alcoholic pancreatitis  . History of esophageal dilatation   . History of hiatal hernia   . History of kidney stones   . History of panic attacks   . History of sepsis    adx 05-01-2014--  urosepsis due to kidney stones obstruction/ hydronephrosis  . History of suicidal ideation    2006   adx  . Hypertension    was on medication for short time, htn was caused by prednisone per pt  . Multiple sclerosis (HCC)    pt. states has 4 small brain lesions  . Nephrolithiasis    bilateral   . Retinal vasculitis   . Uveitis     Patient Active Problem List   Diagnosis Date Noted  . Pancreatitis 02/24/2018  . Protein-calorie malnutrition, severe 02/24/2018  . MS (multiple sclerosis) (HCC) 11/28/2017  . Acute pancreatitis 11/27/2017  . Loose stools 11/27/2017  . Acute on chronic pancreatitis (HCC) 11/27/2017  . Pancreatitis, alcoholic, acute 02/18/2016  . Acute alcoholic pancreatitis 11/07/2014  . Bilateral ureteral calculi   . UTI (lower urinary tract infection)   .  Ureteral calculi 05/02/2014  . Ureterolithiasis 05/01/2014  . UTI (urinary tract infection) 05/01/2014  . Sepsis (HCC) 05/01/2014  . Hydronephrosis 05/01/2014  . Tobacco abuse 05/01/2014  . Failure to thrive (0-17) 01/22/2014  . Melena 11/03/2012  . Odynophagia 11/03/2012  . Esophageal dysphagia 11/03/2012  . ETOH abuse 08/31/2011  . Chronic back pain   . Anxiety   . Weight loss, abnormal 05/04/2010  . Epigastric pain 05/04/2010  . Rectal bleeding 05/04/2010  . Rectal pain 05/04/2010    Past Surgical History:  Procedure Laterality Date  . BIOPSY  11/12/2012   Procedure: GASTRIC AND ESOPHAGEAL BIOPSIES;  Surgeon: Corbin Ade, MD;  Location: [facility];  Service: Endoscopy;;  . CATARACT EXTRACTION W/ INTRAOCULAR LENS  IMPLANT, BILATERAL    . COLONOSCOPY  10/08/2011   Jenkins:Normal colon/Anal condyloma without extension  proximal to dentate line  . CYSTOSCOPY W/ URETERAL STENT PLACEMENT Bilateral 05/01/2014   Procedure: CYSTOSCOPY WITH BILATERAL RETROGRADE PYELOGRAM; BILATERAL URETERAL STENT PLACEMENT;  Surgeon: Barron Alvine, MD;  Location: [facility];  Service: Urology;  Laterality: Bilateral;  . CYSTOSCOPY W/ URETERAL STENT REMOVAL Bilateral 06/06/2014   Procedure: CYSTOSCOPY WITH STENT REMOVAL;  Surgeon: Marcine Matar, MD;  Location: [facility];  Service: Urology;  Laterality: Bilateral;  . CYSTOSCOPY WITH URETEROSCOPY  06/06/2014   Procedure: CYSTOSCOPY WITH URETEROSCOPY;  Surgeon: Marcine Matar, MD;  Location: [facility];  Service: Urology;;  . Bluford Kaufmann WITH URETEROSCOPY AND STENT PLACEMENT Bilateral 06/06/2014   Procedure: CYSTOSCOPY WITH J2 STENT EXTRACTION,,URETEROSCOPY WITH EXTRACTION OF STONES,;  Surgeon: Marcine Matar, MD;  Location: [facility];  Service: Urology;  Laterality: Bilateral;  . ELECTROCAUTERY/ DESICCATION OF CONDYLOMA LESIONS  01-15-2008  &  12-29-2009   PENIS, PERI-RECTAL , PERINUEM  . ESOPHAGOGASTRODUODENOSCOPY (EGD) WITH PROPOFOL N/A 11/12/2012   BJS:EGBTDV esophagus s/p  passage of a Maloney dilator and biopsy. Abnormal gastric mucosa-status post biopsy  . FLEXIBLE BRONCHOSCOPY N/A 08/12/2012   Procedure: FLEXIBLE BRONCHOSCOPY;  Surgeon: Fredirick Maudlin, MD;  Location: [facility];  Service: Pulmonary;  Laterality: N/A;  . MALONEY DILATION N/A 11/12/2012   Procedure: MALONEY DILATION (1mm);  Surgeon: Corbin Ade, MD;  Location: [facility];  Service: Endoscopy;  Laterality: N/A;  . MULTIPLE EXTRACTIONS WITH ALVEOLOPLASTY N/A 04/22/2014   Procedure: MULTIPLE EXTRACTIONS ( 2,3,5,6,7,8,9,10,11,13,14,15,21,28  WITH ALVEOLOPLASTY;  Surgeon: Ocie Doyne, DDS;  Location: [facility];  Service: Oral Surgery;  Laterality: N/A;  . WISDOM TOOTH EXTRACTION         Family History  Problem Relation Age of Onset  . Hypertension Mother   . Breast cancer  Mother   . Melanoma Mother   . Stroke Mother   . Lung cancer Father 24  . Colon cancer Neg Hx   . Colitis Neg Hx   . Cirrhosis Neg Hx   . Liver disease Neg Hx   . Pancreatic cancer Neg Hx   . Pancreatitis Neg Hx   . Anesthesia problems Neg Hx   . Hypotension Neg Hx   . Malignant hyperthermia Neg Hx   . Pseudochol deficiency Neg Hx     Social History   Tobacco Use  . Smoking status: Current Every Day Smoker    Packs/day: 1.00    Years: 20.00    Pack years: 20.00    Types: Cigarettes  . Smokeless tobacco: Never Used  . Tobacco comment: 1 pack per week now as of 02/24/2018   Substance Use Topics  . Alcohol use: Not Currently  .  Drug use: No  lives with brother  Home Medications Prior to Admission medications   Medication Sig Start Date End Date Taking? Authorizing Provider  albuterol (PROVENTIL HFA;VENTOLIN HFA) 108 (90 Base) MCG/ACT inhaler Inhale 1-2 puffs into the lungs every 6 (six) hours as needed for wheezing or shortness of breath.  07/25/15   [provider]  ALPRAZolam Duanne Moron) 1 MG tablet Take 1 mg by mouth 4 (four) times daily.    [provider]  Calcium Carbonate-Vitamin D (CALCIUM HIGH POTENCY/VITAMIN D) 600-200 MG-UNIT TABS Take 1 tablet by mouth daily.    [provider]  enoxaparin (LOVENOX) 40 MG/0.4ML injection Inject 0.4 mLs (40 mg total) into the skin daily. Patient not taking: Reported on 03/18/2018 02/25/18   Dana Allan I, MD  HYDROcodone-acetaminophen [facility]) 10-325 MG tablet Take 1 tablet by mouth 2 (two) times daily as needed. For pain    [provider]  ketorolac (ACULAR) 0.5 % ophthalmic solution Place 2 drops into both eyes 4 (four) times daily.    [provider]  LORazepam (ATIVAN) 2 MG/ML injection Inject 0.5 mLs (1 mg total) into the vein every 6 (six) hours as needed for anxiety or sedation. Patient not taking: Reported on 03/18/2018 02/24/18   Dana Allan I, MD  mometasone-formoterol [facility])  100-5 MCG/ACT AERO Inhale 2 puffs into the lungs daily.    [provider]  morphine 2 MG/ML injection Inject 1 mL (2 mg total) into the vein every 4 (four) hours as needed. Patient not taking: Reported on 03/18/2018 02/24/18   Dana Allan I, MD  natalizumab (TYSABRI) 300 MG/15ML injection Inject 300 mg into the vein every 30 (thirty) days.     [provider]  omeprazole (PRILOSEC) 40 MG capsule Take 40 mg by mouth at bedtime.    [provider]  OXcarbazepine (TRILEPTAL) 150 MG tablet Take 150 mg by mouth 2 (two) times daily.    [provider]  oxycodone (OXY-IR) 5 MG capsule Take 5 mg by mouth every 4 (four) hours as needed.    [provider]  prednisoLONE acetate (PRED FORTE) 1 % ophthalmic suspension Place 2 drops into both eyes 2 (two) times daily.  01/24/15   [provider]  zolpidem (AMBIEN) 10 MG tablet Take 10 mg by mouth at bedtime as needed.    [provider]    Allergies    Patient has no known allergies.  Review of Systems   Review of Systems  All other systems reviewed and are negative.   Physical Exam Updated Vital Signs BP 96/71   Pulse 77   Temp 98.9 F (37.2 C) (Oral)   Resp 18   Ht 5' 7.5" (1.715 m)   Wt 49 kg   SpO2 93%   BMI 16.67 kg/m   Physical Exam Vitals and nursing note reviewed.  Constitutional:      Comments: Thin underweight male who appears chronically ill  HENT:     Head: Normocephalic and atraumatic.     Right Ear: External ear normal.     Left Ear: External ear normal.     Nose: Nose normal.     Mouth/Throat:     Mouth: Mucous membranes are dry.  Eyes:     Extraocular Movements: Extraocular movements intact.     Conjunctiva/sclera: Conjunctivae normal.     Pupils: Pupils are equal, round, and reactive to light.  Cardiovascular:     Rate and Rhythm: Normal rate and regular rhythm.  Pulses: Normal pulses.  Pulmonary:     Effort: Pulmonary effort is normal. No  respiratory distress.     Breath sounds: Normal breath sounds.  Abdominal:     General: Abdomen is flat. Bowel sounds are normal.     Palpations: Abdomen is soft.     Tenderness: There is abdominal tenderness. There is no guarding or rebound.    Musculoskeletal:     Cervical back: Normal range of motion and neck supple.  Skin:    General: Skin is warm and dry.  Neurological:     General: No focal deficit present.     Mental Status: He is oriented to person, place, and time.     Cranial Nerves: No cranial nerve deficit.  Psychiatric:        Mood and Affect: Affect is flat.        Speech: Speech is delayed.        Behavior: Behavior normal. Behavior is cooperative.        Thought Content: Thought content normal.     ED Results / Procedures / Treatments   Labs (all labs ordered are listed, but only abnormal results are displayed) Results for orders placed or performed during the hospital encounter of 06/08/19  Respiratory Panel by RT PCR (Flu A&B, Covid) - Nasopharyngeal Swab   Specimen: Nasopharyngeal Swab  Result Value Ref Range   SARS Coronavirus 2 by RT PCR NEGATIVE NEGATIVE   Influenza A by PCR NEGATIVE NEGATIVE   Influenza B by PCR NEGATIVE NEGATIVE  Lipase, blood  Result Value Ref Range   Lipase 132 (H) 11 - 51 U/L  Comprehensive metabolic panel  Result Value Ref Range   Sodium 136 135 - 145 mmol/L   Potassium 3.7 3.5 - 5.1 mmol/L   Chloride 103 98 - 111 mmol/L   CO2 23 22 - 32 mmol/L   Glucose, Bld 92 70 - 99 mg/dL   BUN 8 6 - 20 mg/dL   Creatinine, Ser 7.62 0.61 - 1.24 mg/dL   Calcium 8.4 (L) 8.9 - 10.3 mg/dL   Total Protein 6.5 6.5 - 8.1 g/dL   Albumin 3.4 (L) 3.5 - 5.0 g/dL   AST 15 15 - 41 U/L   ALT 12 0 - 44 U/L   Alkaline Phosphatase 94 38 - 126 U/L   Total Bilirubin 0.8 0.3 - 1.2 mg/dL   GFR calc non Af Amer >60 >60 mL/min   GFR calc Af Amer >60 >60 mL/min   Anion gap 10 5 - 15  CBC  Result Value Ref Range   WBC 12.7 (H) 4.0 - 10.5 K/uL   RBC  5.48 4.22 - 5.81 MIL/uL   Hemoglobin 17.3 (H) 13.0 - 17.0 g/dL   HCT 26.3 (H) 33.5 - 45.6 %   MCV 95.8 80.0 - 100.0 fL   MCH 31.6 26.0 - 34.0 pg   MCHC 33.0 30.0 - 36.0 g/dL   RDW 25.6 38.9 - 37.3 %   Platelets 369 150 - 400 K/uL   nRBC 0.0 0.0 - 0.2 %  Urinalysis, Routine w reflex microscopic  Result Value Ref Range   Color, Urine YELLOW YELLOW   APPearance CLEAR CLEAR   Specific Gravity, Urine 1.009 1.005 - 1.030   pH 6.0 5.0 - 8.0   Glucose, UA NEGATIVE NEGATIVE mg/dL   Hgb urine dipstick NEGATIVE NEGATIVE   Bilirubin Urine NEGATIVE NEGATIVE   Ketones, ur NEGATIVE NEGATIVE mg/dL   Protein, ur NEGATIVE NEGATIVE mg/dL   Nitrite  NEGATIVE NEGATIVE   Leukocytes,Ua NEGATIVE NEGATIVE  Ethanol  Result Value Ref Range   Alcohol, Ethyl (B) <10 <10 mg/dL  Urine rapid drug screen (hosp performed)  Result Value Ref Range   Opiates POSITIVE (A) NONE DETECTED   Cocaine NONE DETECTED NONE DETECTED   Benzodiazepines POSITIVE (A) NONE DETECTED   Amphetamines NONE DETECTED NONE DETECTED   Tetrahydrocannabinol NONE DETECTED NONE DETECTED   Barbiturates NONE DETECTED NONE DETECTED   Laboratory interpretation all normal except leukocytosis, elevation of lipase, positive UDS    EKG None  Radiology CT Head Wo Contrast  Result Date: 06/09/2019 CLINICAL DATA:  Status post trauma. EXAM: CT HEAD WITHOUT CONTRAST TECHNIQUE: Contiguous axial images were obtained from the base of the skull through the vertex without intravenous contrast. COMPARISON:  None. FINDINGS: Brain: No evidence of acute infarction, hemorrhage, hydrocephalus, extra-axial collection or mass lesion/mass effect. Vascular: No hyperdense vessel or unexpected calcification. Skull: Normal. Negative for fracture or focal lesion. Sinuses/Orbits: There is marked severity left maxillary sinus mucosal thickening. Mild to moderate severity left ethmoid sinus and very mild left-sided frontal sinus mucosal thickening is also seen. Other:  None. IMPRESSION: 1. No acute intracranial abnormality. 2. Left maxillary sinus, left ethmoid sinus and left-sided frontal sinus disease. Electronically Signed   By: Aram Candela M.D.   On: 06/09/2019 03:17   CT Cervical Spine Wo Contrast  Result Date: 06/09/2019 CLINICAL DATA:  Status post trauma. EXAM: CT CERVICAL SPINE WITHOUT CONTRAST TECHNIQUE: Multidetector CT imaging of the cervical spine was performed without intravenous contrast. Multiplanar CT image reconstructions were also generated. COMPARISON:  None. FINDINGS: Alignment: Normal. Skull base and vertebrae: No acute fracture. No primary bone lesion or focal pathologic process. Soft tissues and spinal canal: No prevertebral fluid or swelling. No visible canal hematoma. Disc levels: Mild to moderate severity endplate sclerosis is seen at the levels of C5-C6 and C6-C7. Moderate severity intervertebral disc space narrowing is also seen at these levels. Mild bilateral multilevel facet joint hypertrophy is seen. Upper chest: Negative. Other: None. IMPRESSION: 1. No acute osseous abnormality. 2. Mild to moderate severity degenerative changes at the levels of C5-C6 and C6-C7. Electronically Signed   By: Aram Candela M.D.   On: 06/09/2019 03:22   CT Abdomen Pelvis W Contrast  Result Date: 06/09/2019 CLINICAL DATA:  Abdominal pain for over a month, worse over the last 3-4 days. EXAM: CT ABDOMEN AND PELVIS WITH CONTRAST TECHNIQUE: Multidetector CT imaging of the abdomen and pelvis was performed using the standard protocol following bolus administration of intravenous contrast. CONTRAST:  OMNIPAQUE IOHEXOL 300 MG/ML  SOLN COMPARISON:  May 13, 2019 FINDINGS: Lower chest: Mild areas of scarring and/or atelectasis are seen within the bilateral lung bases, right greater than left. Hepatobiliary: No focal liver abnormality is seen. No gallstones, gallbladder wall thickening, or biliary dilatation. Pancreas: The pancreatic duct measures  approximately 3.5 mm. A very mild amount of peripancreatic inflammatory fat stranding is seen within the region inferior to the junction of the body and tail of the pancreas. Spleen: Normal in size without focal abnormality. Adrenals/Urinary Tract: Adrenal glands are unremarkable. Kidneys are normal, without renal calculi, focal lesion, or hydronephrosis. Bladder is unremarkable. Stomach/Bowel: Stomach is within normal limits. The appendix is not identified. No evidence of bowel wall thickening, distention, or inflammatory changes. Vascular/Lymphatic: There is marked severity calcification of the abdominal aorta without evidence of aneurysmal dilatation or dissection. A thin, linear area of intraluminal low attenuation is seen within the mid and distal  portions of the inferior mesenteric vein. This is surrounded by normal intraluminal contrast enhancement and extends to its level of anastomosis within the portal venous system. (Axial CT image 26 through 46, CT series number 2/coronal reformatted images 26 through 39, CT series number 5). This represents a new finding when compared to the prior study. No enlarged abdominal or pelvic lymph nodes. Reproductive: The prostate gland is mildly enlarged. Other: No abdominal wall hernia or abnormality. No abdominopelvic ascites. Musculoskeletal: Marked severity degenerative changes are seen at the level of L4-L5 with a chronic deformity is seen along the superior endplate of the L2 vertebral body. IMPRESSION: 1. Very mild amount of peripancreatic inflammatory fat stranding, as described above, which may represent mild acute pancreatitis. Correlation with pancreatic enzymes is recommended. 2. Findings worrisome for partial intraluminal thrombus within the mid and distal portions of the inferior mesenteric vein. Further correlation with ultrasound of the abdomen with venous duplex is recommended. 3. Marked severity degenerative changes at the level of L4-L5 with a chronic  deformity along the superior endplate of the L2 vertebral body. 4. Aortic atherosclerosis. Aortic Atherosclerosis (ICD10-I70.0). Electronically Signed   By: Aram Candela M.D.   On: 06/09/2019 03:46    Procedures .Critical Care Performed by: Devoria Albe, MD Authorized by: Devoria Albe, MD   Critical care provider statement:    Critical care time (minutes):  33   Critical care was necessary to treat or prevent imminent or life-threatening deterioration of the following conditions:  Circulatory failure and dehydration   Critical care was time spent personally by me on the following activities:  Discussions with consultants, examination of patient, obtaining history from patient or surrogate, ordering and review of laboratory studies, ordering and review of radiographic studies, re-evaluation of patient's condition and review of old charts   (including critical care time)  Medications Ordered in ED Medications  0.9 %  sodium chloride infusion ( Intravenous New Bag/Given 06/09/19 0127)  morphine 4 MG/ML injection 4 mg (has no administration in time range)  sodium chloride flush (NS) 0.9 % injection 3 mL (3 mLs Intravenous Given 06/08/19 2247)  sodium chloride 0.9 % bolus 1,000 mL (0 mLs Intravenous Stopped 06/09/19 0127)  sodium chloride 0.9 % bolus 500 mL (0 mLs Intravenous Stopped 06/09/19 0125)  fentaNYL (SUBLIMAZE) injection 50 mcg (50 mcg Intravenous Given 06/09/19 0019)  ondansetron (ZOFRAN) injection 4 mg (4 mg Intravenous Given 06/09/19 0017)  fentaNYL (SUBLIMAZE) injection 50 mcg (50 mcg Intravenous Given 06/09/19 0125)  iohexol (OMNIPAQUE) 300 MG/ML solution 100 mL (100 mLs Intravenous Contrast Given 06/09/19 0244)  fentaNYL (SUBLIMAZE) injection 50 mcg (50 mcg Intravenous Given 06/09/19 0400)  ondansetron (ZOFRAN) injection 4 mg (4 mg Intravenous Given 06/09/19 0400)  morphine 4 MG/ML injection 4 mg (4 mg Intravenous Given 06/09/19 6301)    ED Course  I have reviewed the triage vital  signs and the nursing notes.  Pertinent labs & imaging results that were available during my care of the patient were reviewed by me and considered in my medical decision making (see chart for details).    MDM Rules/Calculators/A&P                      Patient was started on IV fluids and IV pain and nausea medication.  He had to have them repeated multiple times.  He received CT scan to look at his abdomen also CT of his head and neck due to his complaints of a fall last week.  CT scan shows a acute pancreatitis and possible partial intraluminal thrombus within the mid and distal portions of the inferior mesenteric vein.  Radiologist recommended ultrasound with venous duplex.  However that will not be available until 7 AM.  4:07 AM Dr Teodoro Kil, hospitalist, refuses to admit patient, he says he "does not know what is going on".  He states he refuses to admit the patient until he has an ultrasound.  He wanted me to send the patient to [facility], ED to get the ultrasound.  However I pointed out to him that they were holding over 20 patients for admission and the previous night they refused to take a patient with a brain bleed to the ED.  He states he will not admit the patient without an ultrasound.  4:53 AM I spoke to [facility], Press photographer at [employer].  She states they are holding 23 people for admission.  By the time I arranged to get patient down to [facility] our ultrasound will be here.  The mesenteric ultrasound was ordered.  Patient continued getting pain medication.  He was informed of his test results and that he would be admitted, the place would be determined by his ultrasound results.  Review the [facility] shows patient has a overdose risk score of 470.  Patient gets #90 hydrocodone 10/325 monthly, last filled 416, #30 Ambien 10 mg tablets April 2, and #120 alprazolam 1 mg tablets last filled April 2.  Dr Jodi Mourning made aware to look for [place] results and contact hospitalist  Final  Clinical Impression(s) / ED Diagnoses Final diagnoses:  Acute pancreatitis without infection or necrosis, unspecified pancreatitis type  Abnormal CT of the abdomen    Rx / DC Orders  Plan admission after getting abdominal US with duplex doppler  Devoria Albe, MD, Concha Pyo, MD 06/09/19 [phone]

## 2019-06-09 NOTE — H&P (Addendum)
History and Physical  Brent [name]:[id number] DOB: 1970/04/27 DOA: 06/08/2019  Referring physician: Jodi Mourning MD  PCP: Brent Morgan, MD   Chief Complaint: abdominal pain  Patient coming from: Home  HPI: Brent [name] is a 49 y.o. male with a prior history of alcohol abuse, currently denies drinking, chronic pancreatitis, multiple hospitalizations for acute exacerbation, chronic opioid dependence, DDD, insomnia, severe GAD with panic attacks, depression, GERD, chronic pancreatic insufficiency and other past medical history detailed below presented to the emergency department with complaints of epigastric abdominal pain nausea and vomiting.  He reports that the pain seems worse this time than his other bouts of acute on chronic pancreatitis.  He was seen in the emergency department and treated with IV fluids and IV pain management.  He had a CT of his abdomen done with contrast and there were findings consistent with acute on chronic pancreatitis.  He was monitored for several hours emergency department and symptoms could not be controlled and admission was requested for further management.  His lipase was noted to be elevated at 130.  His WBC was also elevated at 12.7.  His SARS 2 coronavirus test was negative negative.  His urinalysis was negative.  His urine toxicology screen was positive for benzos and opioids which he takes chronically.  He had a CT cervical spine due to complaints of hitting his head and falling at home and pain.  He also had a CT brain.  There were no acute findings.  Review of Systems: All systems reviewed and apart from history of presenting illness, are negative.  Past Medical History:  Diagnosis Date  . Alcohol use   . Anxiety    Disabled due to panic attacks  . Arthritis   . Bilateral ureteral calculi   . Borderline type 2 diabetes mellitus   . BPH (benign prostatic hypertrophy)   . Chronic back pain   . Condylomata acuminata in male    multiple  procedures --  penile, peri-rectal , perineum  . DDD (degenerative disc disease), cervical   . Depression   . Dyspnea on exertion   . Emphysematous COPD (HCC)   . Epiretinal membrane (ERM) of both eyes   . Fibromyalgia   . GERD (gastroesophageal reflux disease)   . Headache(784.0)   . History of chronic pancreatitis    severeal adx for this /  2008  dx alcoholic pancreatitis  . History of esophageal dilatation   . History of hiatal hernia   . History of kidney stones   . History of panic attacks   . History of sepsis    adx 05-01-2014--  urosepsis due to kidney stones obstruction/ hydronephrosis  . History of suicidal ideation    2006   adx  . Hypertension    was on medication for short time, htn was caused by prednisone per pt  . Multiple sclerosis (HCC)    pt. states has 4 small brain lesions  . Nephrolithiasis    bilateral   . Retinal vasculitis   . Uveitis    Past Surgical History:  Procedure Laterality Date  . BIOPSY  11/12/2012   Procedure: GASTRIC AND ESOPHAGEAL BIOPSIES;  Surgeon: Brent Ade, MD;  Location: [facility];  Service: Endoscopy;;  . CATARACT EXTRACTION W/ INTRAOCULAR LENS  IMPLANT, BILATERAL    . COLONOSCOPY  10/08/2011   Jenkins:Normal colon/Anal condyloma without extension proximal to dentate line  . CYSTOSCOPY W/ URETERAL STENT PLACEMENT Bilateral 05/01/2014   Procedure: CYSTOSCOPY WITH BILATERAL  RETROGRADE PYELOGRAM; BILATERAL URETERAL STENT PLACEMENT;  Surgeon: Rana Snare, MD;  Location: [facility];  Service: Urology;  Laterality: Bilateral;  . CYSTOSCOPY W/ URETERAL STENT REMOVAL Bilateral 06/06/2014   Procedure: CYSTOSCOPY WITH STENT REMOVAL;  Surgeon: Franchot Gallo, MD;  Location[facility];  Service: Urology;  Laterality: Bilateral;  . CYSTOSCOPY WITH URETEROSCOPY  06/06/2014   Procedure: CYSTOSCOPY WITH URETEROSCOPY;  Surgeon: Franchot Gallo, MD;  Location: [facility];  Service: Urology;;  . Consuela Mimes WITH  URETEROSCOPY AND STENT PLACEMENT Bilateral 06/06/2014   Procedure: CYSTOSCOPY WITH J2 STENT EXTRACTION,,URETEROSCOPY WITH EXTRACTION OF STONES,;  Surgeon: Franchot Gallo, MD;  Location: [facility];  Service: Urology;  Laterality: Bilateral;  . ELECTROCAUTERY/ DESICCATION OF CONDYLOMA LESIONS  01-15-2008  &  12-29-2009   PENIS, PERI-RECTAL , PERINUEM  . ESOPHAGOGASTRODUODENOSCOPY (EGD) WITH PROPOFOL N/A 11/12/2012   XBD:ZHGDJM esophagus s/p  passage of a Maloney dilator and biopsy. Abnormal gastric mucosa-status post biopsy  . FLEXIBLE BRONCHOSCOPY N/A 08/12/2012   Procedure: FLEXIBLE BRONCHOSCOPY;  Surgeon: Alonza Bogus, MD;  Location: [facility];  Service: Pulmonary;  Laterality: N/A;  . MALONEY DILATION N/A 11/12/2012   Procedure: MALONEY DILATION (75mm);  Surgeon: Daneil Dolin, MD;  Location: [facility];  Service: Endoscopy;  Laterality: N/A;  . MULTIPLE EXTRACTIONS WITH ALVEOLOPLASTY N/A 04/22/2014   Procedure: MULTIPLE EXTRACTIONS ( 2,3,5,6,7,8,9,10,11,13,14,15,21,28  WITH ALVEOLOPLASTY;  Surgeon: Diona Browner, DDS;  Location: [place];  Service: Oral Surgery;  Laterality: N/A;  . WISDOM TOOTH EXTRACTION     Social History:  reports that he has been smoking cigarettes. He has a 20.00 pack-year smoking history. He has never used smokeless tobacco. He reports previous alcohol use. He reports that he does not use drugs.  No Known Allergies  Family History  Problem Relation Age of Onset  . Hypertension Mother   . Breast cancer Mother   . Melanoma Mother   . Stroke Mother   . Lung cancer Father 37  . Colon cancer Neg Hx   . Colitis Neg Hx   . Cirrhosis Neg Hx   . Liver disease Neg Hx   . Pancreatic cancer Neg Hx   . Pancreatitis Neg Hx   . Anesthesia problems Neg Hx   . Hypotension Neg Hx   . Malignant hyperthermia Neg Hx   . Pseudochol deficiency Neg Hx     Prior to Admission medications   Medication Sig Start Date End Date Taking? Authorizing Provider  albuterol  (PROVENTIL HFA;VENTOLIN HFA) 108 (90 Base) MCG/ACT inhaler Inhale 1-2 puffs into the lungs every 6 (six) hours as needed for wheezing or shortness of breath.  07/25/15  Yes [provider]  ALPRAZolam Duanne Moron) 1 MG tablet Take 1 mg by mouth 4 (four) times daily.   Yes [provider]  Calcium Carbonate-Vitamin D (CALCIUM HIGH POTENCY/VITAMIN D) 600-200 MG-UNIT TABS Take 1 tablet by mouth daily.   Yes [provider]  CREON 3000-9500 units CPEP Take 1 capsule by mouth 3 (three) times daily. 05/17/19  Yes [provider]  gabapentin (NEURONTIN) 600 MG tablet Take 600 mg by mouth 4 (four) times daily. 05/21/19  Yes [provider]  HYDROcodone-acetaminophen (NORCO) 10-325 MG tablet Take 1 tablet by mouth 2 (two) times daily as needed. For pain   Yes [provider]  ketorolac (ACULAR) 0.5 % ophthalmic solution Place 2 drops into both eyes 4 (four) times daily.   Yes [provider]  natalizumab (TYSABRI) 300 MG/15ML injection Inject  300 mg into the vein every 30 (thirty) days.    Yes [provider]  omeprazole (PRILOSEC) 40 MG capsule Take 40 mg by mouth at bedtime.   Yes [provider]  ondansetron (ZOFRAN) 4 MG tablet SMARTSIG:1 Tablet(s) By Mouth 1 to 3 Times Daily 05/28/19  Yes [provider]  prednisoLONE acetate (PRED FORTE) 1 % ophthalmic suspension Place 2 drops into both eyes 2 (two) times daily.  01/24/15  Yes [provider]  SYMBICORT 80-4.5 MCG/ACT inhaler Inhale 1 puff into the lungs 2 (two) times daily. 04/23/19  Yes [provider]  zolpidem (AMBIEN) 10 MG tablet Take 10 mg by mouth at bedtime as needed.   Yes [provider]   Physical Exam: Vitals:   06/09/19 1030 06/09/19 1100 06/09/19 1130 06/09/19 1200  BP: 100/68 116/81 104/78 100/76  Pulse: 66 66 62   Resp:      Temp:      TempSrc:      SpO2: 91% 91% 91%   Weight:      Height:         General exam: Moderately  built and nourished patient, lying comfortably supine on the gurney in no obvious distress.  Head, eyes and ENT: Nontraumatic and normocephalic. Pupils equally reacting to light and accommodation. Oral mucosa moist.  Neck: Supple. No JVD, carotid bruit or thyromegaly.  Lymphatics: No lymphadenopathy.  Respiratory system: Clear to auscultation. No increased work of breathing.  Cardiovascular system: S1 and S2 heard, RRR. No JVD, murmurs, gallops, clicks or pedal edema.  Gastrointestinal system: Abdomen is nondistended, soft and nontender. Normal bowel sounds heard. No organomegaly or masses appreciated.  Central nervous system: Alert and oriented. No focal neurological deficits.  Extremities: Symmetric 5 x 5 power. Peripheral pulses symmetrically felt.   Skin: No rashes or acute findings.  Musculoskeletal system: Negative exam.  Psychiatry: Pleasant and cooperative.  Labs on Admission:  Basic Metabolic Panel: Recent Labs  Lab 06/08/19 2122  NA 136  K 3.7  CL 103  CO2 23  GLUCOSE 92  BUN 8  CREATININE 0.61  CALCIUM 8.4*   Liver Function Tests: Recent Labs  Lab 06/08/19 2122  AST 15  ALT 12  ALKPHOS 94  BILITOT 0.8  PROT 6.5  ALBUMIN 3.4*   Recent Labs  Lab 06/08/19 2122  LIPASE 132*   No results for input(s): AMMONIA in the last 168 hours. CBC: Recent Labs  Lab 06/08/19 2122  WBC 12.7*  HGB 17.3*  HCT 52.5*  MCV 95.8  PLT 369   Cardiac Enzymes: No results for input(s): CKTOTAL, CKMB, CKMBINDEX, TROPONINI in the last 168 hours.  BNP (last 3 results) No results for input(s): PROBNP in the last 8760 hours. CBG: No results for input(s): GLUCAP in the last 168 hours.  Radiological Exams on Admission: CT Head Wo Contrast  Result Date: 06/09/2019 CLINICAL DATA:  Status post trauma. EXAM: CT HEAD WITHOUT CONTRAST TECHNIQUE: Contiguous axial images were obtained from the base of the skull through the vertex without intravenous contrast. COMPARISON:   None. FINDINGS: Brain: No evidence of acute infarction, hemorrhage, hydrocephalus, extra-axial collection or mass lesion/mass effect. Vascular: No hyperdense vessel or unexpected calcification. Skull: Normal. Negative for fracture or focal lesion. Sinuses/Orbits: There is marked severity left maxillary sinus mucosal thickening. Mild to moderate severity left ethmoid sinus and very mild left-sided frontal sinus mucosal thickening is also seen. Other: None. IMPRESSION: 1. No acute intracranial abnormality. 2. Left maxillary sinus, left ethmoid sinus and  left-sided frontal sinus disease. Electronically Signed   By: Aram Candela M.D.   On: 06/09/2019 03:17   CT Cervical Spine Wo Contrast  Result Date: 06/09/2019 CLINICAL DATA:  Status post trauma. EXAM: CT CERVICAL SPINE WITHOUT CONTRAST TECHNIQUE: Multidetector CT imaging of the cervical spine was performed without intravenous contrast. Multiplanar CT image reconstructions were also generated. COMPARISON:  None. FINDINGS: Alignment: Normal. Skull base and vertebrae: No acute fracture. No primary bone lesion or focal pathologic process. Soft tissues and spinal canal: No prevertebral fluid or swelling. No visible canal hematoma. Disc levels: Mild to moderate severity endplate sclerosis is seen at the levels of C5-C6 and C6-C7. Moderate severity intervertebral disc space narrowing is also seen at these levels. Mild bilateral multilevel facet joint hypertrophy is seen. Upper chest: Negative. Other: None. IMPRESSION: 1. No acute osseous abnormality. 2. Mild to moderate severity degenerative changes at the levels of C5-C6 and C6-C7. Electronically Signed   By: Aram Candela M.D.   On: 06/09/2019 03:22   CT Abdomen Pelvis W Contrast  Result Date: 06/09/2019 CLINICAL DATA:  Abdominal pain for over a month, worse over the last 3-4 days. EXAM: CT ABDOMEN AND PELVIS WITH CONTRAST TECHNIQUE: Multidetector CT imaging of the abdomen and pelvis was performed using  the standard protocol following bolus administration of intravenous contrast. CONTRAST:  OMNIPAQUE IOHEXOL 300 MG/ML  SOLN COMPARISON:  May 13, 2019 FINDINGS: Lower chest: Mild areas of scarring and/or atelectasis are seen within the bilateral lung bases, right greater than left. Hepatobiliary: No focal liver abnormality is seen. No gallstones, gallbladder wall thickening, or biliary dilatation. Pancreas: The pancreatic duct measures approximately 3.5 mm. A very mild amount of peripancreatic inflammatory fat stranding is seen within the region inferior to the junction of the body and tail of the pancreas. Spleen: Normal in size without focal abnormality. Adrenals/Urinary Tract: Adrenal glands are unremarkable. Kidneys are normal, without renal calculi, focal lesion, or hydronephrosis. Bladder is unremarkable. Stomach/Bowel: Stomach is within normal limits. The appendix is not identified. No evidence of bowel wall thickening, distention, or inflammatory changes. Vascular/Lymphatic: There is marked severity calcification of the abdominal aorta without evidence of aneurysmal dilatation or dissection. A thin, linear area of intraluminal low attenuation is seen within the mid and distal portions of the inferior mesenteric vein. This is surrounded by normal intraluminal contrast enhancement and extends to its level of anastomosis within the portal venous system. (Axial CT image 26 through 46, CT series number 2/coronal reformatted images 26 through 39, CT series number 5). This represents a new finding when compared to the prior study. No enlarged abdominal or pelvic lymph nodes. Reproductive: The prostate gland is mildly enlarged. Other: No abdominal wall hernia or abnormality. No abdominopelvic ascites. Musculoskeletal: Marked severity degenerative changes are seen at the level of L4-L5 with a chronic deformity is seen along the superior endplate of the L2 vertebral body. IMPRESSION: 1. Very mild amount of  peripancreatic inflammatory fat stranding, as described above, which may represent mild acute pancreatitis. Correlation with pancreatic enzymes is recommended. 2. Findings worrisome for partial intraluminal thrombus within the mid and distal portions of the inferior mesenteric vein. Further correlation with ultrasound of the abdomen with venous duplex is recommended. 3. Marked severity degenerative changes at the level of L4-L5 with a chronic deformity along the superior endplate of the L2 vertebral body. 4. Aortic atherosclerosis. Aortic Atherosclerosis (ICD10-I70.0). Electronically Signed   By: Aram Candela M.D.   On: 06/09/2019 03:46   US ABDOMINAL PELVIC  ART/VENT FLOW DOPPLER LIMITED  Result Date: 06/09/2019 CLINICAL DATA:  Probable thrombosis of inferior mesenteric vein. EXAM: DOPPLER ULTRASOUND OF THE abdominal venous structures. TECHNIQUE: Color and spectral Doppler ultrasound was utilized to evaluate blood flow of several abdominal venous structures. COMPARISON:  CT of same day. FINDINGS: This exam is limited due to overlying bowel gas. The visualized portion of IVC appears to be widely patent. There does appear to be blood flow within the proximal portion of the inferior mesenteric vein. However, no flow is noted in its middle and distal portions suggesting the possibility of thrombus. IMPRESSION: Exam is limited due to overlying bowel gas. Visualized portion of IVC is widely patent. Doppler does not demonstrate definite flow within the middle and distal portions of the inferior mesenteric vein suggesting the possibility of thrombosis. Electronically Signed   By: Lupita Raider M.D.   On: 06/09/2019 10:23   Assessment/Plan Principal Problem:   Acute on chronic pancreatitis (HCC) Active Problems:   Chronic back pain   Esophageal dysphagia   Tobacco abuse   Acute pancreatitis   MS (multiple sclerosis) (HCC)   Protein-calorie malnutrition, severe   Question of Mesenteric thrombosis    1. Acute on chronic pancreatitis-patient presents with severe abdominal pain requiring IV pain management.  IV nausea medications ordered.  N.p.o.  Slowly advance diet as pain improves.  Monitor lipase levels.  GI consultation requested.  Temporarily holding pancreatic enzymes while n.p.o. but would resume when diet is started.  Continue IV Protonix.  Continue IV fluids. 2. Opioid dependence-IV pain medications ordered and will adjust as needed for better pain management. 3. GAD with panic episodes-resumed alprazolam slightly lower dose given that he is on IV opioids at this time. 4. Question of mesenteric venous thrombosis-I reviewed films and discussed with radiologist Dr. Pia Mau who also reviewed the films with interventional radiologist at this time they do not feel that this is a true thrombosis and likely radiological blurb given the dense thick contrast used.  Expectant management.  No need to anticoagulate. 5. Severe protein calorie malnutrition-obtain dietitian consultation when able to take p.o. 6. Esophageal dysphagia-IV Protonix ordered. 7. Leukocytosis-likely reactive will follow with daily CBC with differential.  DVT Prophylaxis: Lovenox Code Status: Full Family Communication: Updated at bedside Disposition Plan: Inpatient for noted above  Time spent: 58 minutes   Clanford Laural Benes, MD [facility] How to contact the [facility] Attending or Consulting provider 7A - 7P or covering provider during after hours 7P -7A, for this patient?  1. Check the care team in [facility] and look for a) attending/consulting [facility] provider listed and b) the [facility] team listed 2. Log into [email or web address] and use [place]'s universal password to access. If you do not have the password, please contact the hospital operator. 3. Locate the [facility] provider you are looking for under [facility] and page to a number that you can be directly reached. 4. If you still have difficulty reaching the provider, please page  the [facility] (Director on Call) for the Hospitalists listed on amion for assistance.

## 2019-06-09 NOTE — Consult Note (Signed)
Referring Provider: [facility] Primary Care Physician:  Gareth Morgan, MD Primary Gastroenterologist:  Dr. Jena Gauss  Date of Admission: 06/08/2019 Date of Consultation: 06/09/2019  Reason for Consultation:  Acute on chronic pancreatitis, mesenteric venous thrombosis  HPI:  Brent [name] is a 49 y.o. male with a past medical history of past alcohol use (none in the past year), anxiety, chronic pancreatitis, GERD, fibromyalgia, uveitis, multiple sclerosis, hypertension, chronic opioid dependence, insomnia, severe GAD with panic attacks, depression, GERD, chronic pancreatic insufficiency.    The patient has a complex medical history with multiple medical conditions.  We last saw the patient 11/03/2012 for odynophagia, epigastric pain, melena, dysphagia.  At that time noted chronic steroids due to probable MS.  Frequent aspirin powder use and history of pancreatitis likely alcohol related with no ongoing alcohol use.  Query peptic ulcer disease and esophagitis related to opportunistic infection such as CMV or Candida.  Recommended EGD.  EGD was completed 11/12/2012 which found normal esophagus status post dilation, abnormal gastric mucosa status post biopsy.  Recommended continue omeprazole 40 mg daily and Carafate 1 g 4 times a day for 10 days.  Surgical pathology found the biopsies to be ulcerated gastric body and antral mucosa negative for H. Pylori.  Colonoscopy up-to-date 10/08/2011 which found normal colon, anal condyloma without extension proximal to the dentate line.  Recommended repeat colonoscopy in 10 years (2023).  Review of care everywhere finds that on 02/25/2018 the patient was admitted to [facility] for alcoholism, pancreatic pseudocyst, acute pancreatitis.  He underwent surgery on 03/01/2018 for open cystogastrostomy, placement of GJ feeding tube, and pancreatic necrosectomy.  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  The patient continue to follow as an outpatient with surgery for postoperative course.  He presented emergency department epigastric abdominal pain, nausea, vomiting.  His pain started about over a month ago but is gotten progressively worse in the past several days.  This feels worse than his typical chronic pancreatitis.  CT of the abdomen with contrast consistent with acute on chronic pancreatitis overall felt to be mild on imaging.  Also found partial intraluminal thrombus within the mid and distal portions of the inferior mesenteric vein with recommended further evaluation with ultrasound duplex.  While in the emergency room, noted difficulty managing pain symptoms.  Lipase noted to be elevated mildly at 130.  White blood cell count elevated at 12.7.  SARS 2 coronavirus testing negative.  CMP with normal creatinine 0.61, LFTs normal, calcium low at 8.4.  Ultrasound duplex of the abdomen was completed after 7 AM which found limited due to overlying bowel gas, noted widely patent IVC in the visualized portion.  Doppler does not demonstrate definite flow within the middle and distal portions of the inferior mesenteric vein suggesting the possibility of thrombosis.  Relook of CT with no evidence of bowel wall thickening, distention, or inflammatory changes.  Stomach also normal.  Spleen normal in size.  Today he states he's been ahving pain for over a month but has gotten progressively worse over the past 1-2 weeks. In this setting, worsening severity of pain postprandially. Prior to ER presentation he had some N/V which he felt was mostly undigested food. Denies hematemesis. Abdominal pain feels a little different this  time  compared to his typical pancreatitis flare. Typically pain is mid-abdominal/periumbilical. He does have some tenderness there, but also left abdomen and LLQ. Noted decreased appetite with his pain. Denies GERD flares (on PPI), denies hematochezia. Feels stools have been darker. No ETOH in over a year. He does smoke a pack a day. No other overt GI complaints.  Of note he does take an ASA powder once a day.  Past Medical History:  Diagnosis Date  . Alcohol use   . Anxiety    Disabled due to panic attacks  . Arthritis   . Bilateral ureteral calculi   . Borderline type 2 diabetes mellitus   . BPH (benign prostatic hypertrophy)   . Chronic back pain   . Condylomata acuminata in male    multiple procedures --  penile, peri-rectal , perineum  . DDD (degenerative disc disease), cervical   . Depression   . Dyspnea on exertion   . Emphysematous COPD (HCC)   . Epiretinal membrane (ERM) of both eyes   . Fibromyalgia   . GERD (gastroesophageal reflux disease)   . Headache(784.0)   . History of chronic pancreatitis    severeal adx for this /  2008  dx alcoholic pancreatitis  . History of esophageal dilatation   . History of hiatal hernia   . History of kidney stones   . History of panic attacks   . History of sepsis    adx 05-01-2014--  urosepsis due to kidney stones obstruction/ hydronephrosis  . History of suicidal ideation    2006   adx  . Hypertension    was on medication for short time, htn was caused by prednisone per pt  . Multiple sclerosis (HCC)    pt. states has 4 small brain lesions  . Nephrolithiasis    bilateral   . Retinal vasculitis   . Uveitis     Past Surgical History:  Procedure Laterality Date  . BIOPSY  11/12/2012   Procedure: GASTRIC AND ESOPHAGEAL BIOPSIES;  Surgeon: Corbin Ade, MD;  Location: [facility];  Service: Endoscopy;;  . CATARACT EXTRACTION W/ INTRAOCULAR LENS  IMPLANT, BILATERAL    . COLONOSCOPY  10/08/2011   Jenkins:Normal colon/Anal condyloma  without extension proximal to dentate line  . CYSTOSCOPY W/ URETERAL STENT PLACEMENT Bilateral 05/01/2014   Procedure: CYSTOSCOPY WITH BILATERAL RETROGRADE PYELOGRAM; BILATERAL URETERAL STENT PLACEMENT;  Surgeon: Barron Alvine, MD;  Location: [facility];  Service: Urology;  Laterality: Bilateral;  . CYSTOSCOPY W/ URETERAL STENT REMOVAL Bilateral 06/06/2014   Procedure: CYSTOSCOPY WITH STENT REMOVAL;  Surgeon: Marcine Matar, MD;  Location: [facility];  Service: Urology;  Laterality: Bilateral;  . CYSTOSCOPY WITH URETEROSCOPY  06/06/2014   Procedure: CYSTOSCOPY WITH URETEROSCOPY;  Surgeon: Marcine Matar, MD;  Location: [facility];  Service: Urology;;  . Bluford Kaufmann WITH URETEROSCOPY AND STENT PLACEMENT Bilateral 06/06/2014   Procedure: CYSTOSCOPY WITH J2 STENT EXTRACTION,,URETEROSCOPY WITH EXTRACTION OF STONES,;  Surgeon: Marcine Matar, MD;  Location: [facility];  Service: Urology;  Laterality: Bilateral;  . ELECTROCAUTERY/ DESICCATION OF CONDYLOMA LESIONS  01-15-2008  &  12-29-2009   PENIS, PERI-RECTAL , PERINUEM  . ESOPHAGOGASTRODUODENOSCOPY (EGD) WITH PROPOFOL N/A 11/12/2012   BJS:EGBTDV esophagus s/p  passage of a Maloney dilator and biopsy. Abnormal gastric mucosa-status post biopsy  . FLEXIBLE BRONCHOSCOPY N/A 08/12/2012   Procedure: FLEXIBLE BRONCHOSCOPY;  Surgeon: Fredirick Maudlin, MD;  Location: [facility];  Service: Pulmonary;  Laterality: N/A;  . MALONEY DILATION N/A 11/12/2012  Procedure: MALONEY DILATION (20mm);  Surgeon: Corbin Ade, MD;  Location: [facility];  Service: Endoscopy;  Laterality: N/A;  . MULTIPLE EXTRACTIONS WITH ALVEOLOPLASTY N/A 04/22/2014   Procedure: MULTIPLE EXTRACTIONS ( 2,3,5,6,7,8,9,10,11,13,14,15,21,28  WITH ALVEOLOPLASTY;  Surgeon: Ocie Doyne, DDS;  Location: [facility];  Service: Oral Surgery;  Laterality: N/A;  . WISDOM TOOTH EXTRACTION      Prior to Admission medications   Medication Sig Start Date End Date Taking?  Authorizing Provider  albuterol (PROVENTIL HFA;VENTOLIN HFA) 108 (90 Base) MCG/ACT inhaler Inhale 1-2 puffs into the lungs every 6 (six) hours as needed for wheezing or shortness of breath.  07/25/15  Yes [provider]  ALPRAZolam Prudy Feeler) 1 MG tablet Take 1 mg by mouth 4 (four) times daily.   Yes [provider]  Calcium Carbonate-Vitamin D (CALCIUM HIGH POTENCY/VITAMIN D) 600-200 MG-UNIT TABS Take 1 tablet by mouth daily.   Yes [provider]  CREON 3000-9500 units CPEP Take 1 capsule by mouth 3 (three) times daily. 05/17/19  Yes [provider]  gabapentin (NEURONTIN) 600 MG tablet Take 600 mg by mouth 4 (four) times daily. 05/21/19  Yes [provider]  HYDROcodone-acetaminophen (NORCO) 10-325 MG tablet Take 1 tablet by mouth 2 (two) times daily as needed. For pain   Yes [provider]  ketorolac (ACULAR) 0.5 % ophthalmic solution Place 2 drops into both eyes 4 (four) times daily.   Yes [provider]  natalizumab (TYSABRI) 300 MG/15ML injection Inject 300 mg into the vein every 30 (thirty) days.    Yes [provider]  omeprazole (PRILOSEC) 40 MG capsule Take 40 mg by mouth at bedtime.   Yes [provider]  ondansetron (ZOFRAN) 4 MG tablet SMARTSIG:1 Tablet(s) By Mouth 1 to 3 Times Daily 05/28/19  Yes [provider]  prednisoLONE acetate (PRED FORTE) 1 % ophthalmic suspension Place 2 drops into both eyes 2 (two) times daily.  01/24/15  Yes [provider]  SYMBICORT 80-4.5 MCG/ACT inhaler Inhale 1 puff into the lungs 2 (two) times daily. 04/23/19  Yes [provider]  zolpidem (AMBIEN) 10 MG tablet Take 10 mg by mouth at bedtime as needed.   Yes [provider]    Current Facility-Administered Medications  Medication Dose Route Frequency Provider Last Rate Last Admin  . 0.9 %  sodium chloride infusion   Intravenous Continuous Devoria Albe, MD 100 mL/hr at 06/09/19 1242 New Bag at  06/09/19 1242   Current Outpatient Medications  Medication Sig Dispense Refill  . albuterol (PROVENTIL HFA;VENTOLIN HFA) 108 (90 Base) MCG/ACT inhaler Inhale 1-2 puffs into the lungs every 6 (six) hours as needed for wheezing or shortness of breath.     . ALPRAZolam (XANAX) 1 MG tablet Take 1 mg by mouth 4 (four) times daily.    . Calcium Carbonate-Vitamin D (CALCIUM HIGH POTENCY/VITAMIN D) 600-200 MG-UNIT TABS Take 1 tablet by mouth daily.    Marland Kitchen CREON 3000-9500 units CPEP Take 1 capsule by mouth 3 (three) times daily.    Marland Kitchen gabapentin (NEURONTIN) 600 MG tablet Take 600 mg by mouth 4 (four) times daily.    Marland Kitchen HYDROcodone-acetaminophen (NORCO) 10-325 MG tablet Take 1 tablet by mouth 2 (two) times daily as needed. For pain    . ketorolac (ACULAR) 0.5 % ophthalmic solution Place 2 drops into both eyes 4 (four) times daily.    . natalizumab (TYSABRI) 300 MG/15ML injection Inject 300 mg into the vein every 30 (thirty) days.     Marland Kitchen  omeprazole (PRILOSEC) 40 MG capsule Take 40 mg by mouth at bedtime.    . ondansetron (ZOFRAN) 4 MG tablet SMARTSIG:1 Tablet(s) By Mouth 1 to 3 Times Daily    . prednisoLONE acetate (PRED FORTE) 1 % ophthalmic suspension Place 2 drops into both eyes 2 (two) times daily.     . SYMBICORT 80-4.5 MCG/ACT inhaler Inhale 1 puff into the lungs 2 (two) times daily.    Marland Kitchen zolpidem (AMBIEN) 10 MG tablet Take 10 mg by mouth at bedtime as needed.      Allergies as of 06/08/2019  . (No Known Allergies)    Family History  Problem Relation Age of Onset  . Hypertension Mother   . Breast cancer Mother   . Melanoma Mother   . Stroke Mother   . Lung cancer Father 59  . Colon cancer Neg Hx   . Colitis Neg Hx   . Cirrhosis Neg Hx   . Liver disease Neg Hx   . Pancreatic cancer Neg Hx   . Pancreatitis Neg Hx   . Anesthesia problems Neg Hx   . Hypotension Neg Hx   . Malignant hyperthermia Neg Hx   . Pseudochol deficiency Neg Hx     Social History   Socioeconomic History  .  Marital status: Single    Spouse name: Not on file  . Number of children: 0  . Years of education: Not on file  . Highest education level: Not on file  Occupational History  . Occupation: disabled    Comment: anxiety  Tobacco Use  . Smoking status: Current Every Day Smoker    Packs/day: 1.00    Years: 20.00    Pack years: 20.00    Types: Cigarettes  . Smokeless tobacco: Never Used  . Tobacco comment: 1 pack per week now as of 02/24/2018   Substance and Sexual Activity  . Alcohol use: Not Currently  . Drug use: No  . Sexual activity: Not on file  Other Topics Concern  . Not on file  Social History Narrative   ** Merged History Encounter **       Social Determinants of Health   Financial Resource Strain:   . Difficulty of Paying Living Expenses:   Food Insecurity:   . Worried About Programme researcher, broadcasting/film/video in the Last Year:   . Barista in the Last Year:   Transportation Needs:   . Freight forwarder (Medical):   Marland Kitchen Lack of Transportation (Non-Medical):   Physical Activity:   . Days of Exercise per Week:   . Minutes of Exercise per Session:   Stress:   . Feeling of Stress :   Social Connections:   . Frequency of Communication with Friends and Family:   . Frequency of Social Gatherings with Friends and Family:   . Attends Religious Services:   . Active Member of Clubs or Organizations:   . Attends Banker Meetings:   Marland Kitchen Marital Status:   Intimate Partner Violence:   . Fear of Current or Ex-Partner:   . Emotionally Abused:   Marland Kitchen Physically Abused:   . Sexually Abused:     Review of Systems: General: Negative for weight loss, fever, chills, worsening fatigue. ENT: Negative for hoarseness, difficulty swallowing CV: Negative for chest pain, angina, palpitations, peripheral edema.  Respiratory: Negative for dyspnea at rest, cough, sputum, wheezing.  GI: See history of present illness. MS: Chronic pain.  Psych: History of anxiety, depression,  suicidal ideation. None acutely  currently Endo: Negative for unusual weight change.  Heme: Negative for bruising or bleeding. Allergy: Negative for rash or hives.  Physical Exam: Vital signs in last 24 hours: Temp:  [98.9 F (37.2 C)] 98.9 F (37.2 C) (04/27 2022) Pulse Rate:  [62-125] 62 (04/28 1130) Resp:  [16-18] 18 (04/28 0630) BP: (96-143)/(68-115) 100/76 (04/28 1200) SpO2:  [90 %-96 %] 91 % (04/28 1130) Weight:  [49 kg] 49 kg (04/27 2020)   General:   Alert,  Well-developed, well-nourished, pleasant and cooperative in NAD. He does appear uncomfortable. Head:  Normocephalic and atraumatic. Eyes:  Sclera clear, no icterus. Conjunctiva pink. Ears:  Normal auditory acuity. Neck:  Supple; no masses or thyromegaly. Lungs:  Clear throughout to auscultation. No wheezes, crackles, or rhonchi. No acute distress. Heart:  Regular rate and rhythm; no murmurs, clicks, rubs,  or gallops. Abdomen:  Soft, and nondistended. Noted TTP periumbilical and LLQ. No masses, hepatosplenomegaly or hernias noted. Normal bowel sounds, without guarding, and without rebound.   Rectal:  Deferred.   Msk:  Symmetrical without gross deformities. Pulses:  Normal bilateral DP pulses noted. Extremities:  Without clubbing or edema. Neurologic:  Alert and  oriented x4;  grossly normal neurologically. Psych:  Alert and cooperative. Normal mood and affect.  Intake/Output from previous day: 04/27 0701 - 04/28 0700 In: 1500 [IV Piggyback:1500] Out: -  Intake/Output this shift: Total I/O In: 1000 [I.V.:1000] Out: -   Lab Results: Recent Labs    06/08/19 2122  WBC 12.7*  HGB 17.3*  HCT 52.5*  PLT 369   BMET Recent Labs    06/08/19 2122  NA 136  K 3.7  CL 103  CO2 23  GLUCOSE 92  BUN 8  CREATININE 0.61  CALCIUM 8.4*   LFT Recent Labs    06/08/19 2122  PROT 6.5  ALBUMIN 3.4*  AST 15  ALT 12  ALKPHOS 94  BILITOT 0.8   PT/INR No results for input(s): LABPROT, INR in the last 72  hours. Hepatitis Panel No results for input(s): HEPBSAG, HCVAB, HEPAIGM, HEPBIGM in the last 72 hours. C-Diff No results for input(s): CDIFFTOX in the last 72 hours.  Studies/Results: CT Head Wo Contrast  Result Date: 06/09/2019 CLINICAL DATA:  Status post trauma. EXAM: CT HEAD WITHOUT CONTRAST TECHNIQUE: Contiguous axial images were obtained from the base of the skull through the vertex without intravenous contrast. COMPARISON:  None. FINDINGS: Brain: No evidence of acute infarction, hemorrhage, hydrocephalus, extra-axial collection or mass lesion/mass effect. Vascular: No hyperdense vessel or unexpected calcification. Skull: Normal. Negative for fracture or focal lesion. Sinuses/Orbits: There is marked severity left maxillary sinus mucosal thickening. Mild to moderate severity left ethmoid sinus and very mild left-sided frontal sinus mucosal thickening is also seen. Other: None. IMPRESSION: 1. No acute intracranial abnormality. 2. Left maxillary sinus, left ethmoid sinus and left-sided frontal sinus disease. Electronically Signed   By: Aram Candela M.D.   On: 06/09/2019 03:17   CT Cervical Spine Wo Contrast  Result Date: 06/09/2019 CLINICAL DATA:  Status post trauma. EXAM: CT CERVICAL SPINE WITHOUT CONTRAST TECHNIQUE: Multidetector CT imaging of the cervical spine was performed without intravenous contrast. Multiplanar CT image reconstructions were also generated. COMPARISON:  None. FINDINGS: Alignment: Normal. Skull base and vertebrae: No acute fracture. No primary bone lesion or focal pathologic process. Soft tissues and spinal canal: No prevertebral fluid or swelling. No visible canal hematoma. Disc levels: Mild to moderate severity endplate sclerosis is seen at the levels of C5-C6 and C6-C7. Moderate severity  intervertebral disc space narrowing is also seen at these levels. Mild bilateral multilevel facet joint hypertrophy is seen. Upper chest: Negative. Other: None. IMPRESSION: 1. No acute  osseous abnormality. 2. Mild to moderate severity degenerative changes at the levels of C5-C6 and C6-C7. Electronically Signed   By: Virgina Norfolk M.D.   On: 06/09/2019 03:22   CT Abdomen Pelvis W Contrast  Result Date: 06/09/2019 CLINICAL DATA:  Abdominal pain for over a month, worse over the last 3-4 days. EXAM: CT ABDOMEN AND PELVIS WITH CONTRAST TECHNIQUE: Multidetector CT imaging of the abdomen and pelvis was performed using the standard protocol following bolus administration of intravenous contrast. CONTRAST:  116mL OMNIPAQUE IOHEXOL 300 MG/ML  SOLN COMPARISON:  May 13, 2019 FINDINGS: Lower chest: Mild areas of scarring and/or atelectasis are seen within the bilateral lung bases, right greater than left. Hepatobiliary: No focal liver abnormality is seen. No gallstones, gallbladder wall thickening, or biliary dilatation. Pancreas: The pancreatic duct measures approximately 3.5 mm. A very mild amount of peripancreatic inflammatory fat stranding is seen within the region inferior to the junction of the body and tail of the pancreas. Spleen: Normal in size without focal abnormality. Adrenals/Urinary Tract: Adrenal glands are unremarkable. Kidneys are normal, without renal calculi, focal lesion, or hydronephrosis. Bladder is unremarkable. Stomach/Bowel: Stomach is within normal limits. The appendix is not identified. No evidence of bowel wall thickening, distention, or inflammatory changes. Vascular/Lymphatic: There is marked severity calcification of the abdominal aorta without evidence of aneurysmal dilatation or dissection. A thin, linear area of intraluminal low attenuation is seen within the mid and distal portions of the inferior mesenteric vein. This is surrounded by normal intraluminal contrast enhancement and extends to its level of anastomosis within the portal venous system. (Axial CT image 26 through 46, CT series number 2/coronal reformatted images 26 through 39, CT series number 5). This  represents a new finding when compared to the prior study. No enlarged abdominal or pelvic lymph nodes. Reproductive: The prostate gland is mildly enlarged. Other: No abdominal wall hernia or abnormality. No abdominopelvic ascites. Musculoskeletal: Marked severity degenerative changes are seen at the level of L4-L5 with a chronic deformity is seen along the superior endplate of the L2 vertebral body. IMPRESSION: 1. Very mild amount of peripancreatic inflammatory fat stranding, as described above, which may represent mild acute pancreatitis. Correlation with pancreatic enzymes is recommended. 2. Findings worrisome for partial intraluminal thrombus within the mid and distal portions of the inferior mesenteric vein. Further correlation with ultrasound of the abdomen with venous duplex is recommended. 3. Marked severity degenerative changes at the level of L4-L5 with a chronic deformity along the superior endplate of the L2 vertebral body. 4. Aortic atherosclerosis. Aortic Atherosclerosis (ICD10-I70.0). Electronically Signed   By: Virgina Norfolk M.D.   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 EXAM: DOPPLER ULTRASOUND OF THE abdominal venous structures. TECHNIQUE: Color and spectral Doppler ultrasound was utilized to evaluate blood flow of several abdominal venous structures. COMPARISON:  CT of same day. FINDINGS: This exam is limited due to overlying bowel gas. The visualized portion of IVC appears to be widely patent. There does appear to be blood flow within the proximal portion of the inferior mesenteric vein. However, no flow is noted in its middle and distal portions suggesting the possibility of thrombus. IMPRESSION: Exam is limited due to overlying bowel gas. Visualized portion of IVC is widely  patent. Doppler does not demonstrate definite flow within the middle and distal portions of  the inferior mesenteric vein suggesting the possibility of thrombosis. Electronically Signed   By: Lupita Raider M.D.   On: 06/09/2019 10:23    Impression: Pleasant 49 year old gentleman who presents for greater than 1 month of abdominal pain and progressive worsening over the past 1 to 2 weeks in the setting of chronic pancreatitis with previous large pseudocyst status post surgery at [facility].  Chronic pancreatitis and pancreatic insufficiency generally related to alcohol.  Denies any alcohol over the past year.  He developed worsening symptoms over the past 1 to 2 weeks, had one episode of nausea and vomiting.  No hematochezia, hematemesis.  Noted dark stools with daily aspirin powder use.  Acute on chronic pancreatitis- elevated lipase around 130, CT consistent with mild acute on chronic pancreatitis.  Abdominal pain in the periumbilical and left side/left lower quadrant area.  Denies alcohol in the past year.  Resolution of pseudocyst status post surgery at [facility].  No findings consistent with recurrent pseudocyst or necrosis.  Initial concern for inferior mesenteric vein thrombosis- initially there was concern for possible inferior mesenteric vein thrombosis.  CT findings related to his small bowel were unremarkable including no bowel wall edema or inflammation. I discussed the case and reviewed the images with Dr. Tyron Russell.  He feels area of concern in the IMV is likely filling defect.  Additionally other large venous vessels are widely patent. He consulted with interventional radiology who agreed with his opinion.  At this point doubtful vascular complication.  Plan: 1. NPO for now 2. Pain and nausea management per hospitalist 3. Follow Labs 4. Unlikely benefit to large volume fluids given symptoms >72 hours although given mild elevation of hct would initially set IV NS to at least 150 mL/hr 5. Monitor Hct and  BUN 6. Can consider clear liquid diet in the am based on clinical progression 7. Supportive measures    Thank you for allowing [place] to participate in the care of Clarene Reamer, DNP, AGNP-C Adult & Gerontological Nurse Practitioner [facility]   LOS: 0 days     06/09/2019, 1:17 PM

## 2019-06-09 NOTE — ED Provider Notes (Signed)
Patient care was signed out to follow-up ultrasound results and admit to the hospitalist for pancreatitis.  Ultrasound results inconclusive IVC patent.  Reviewed CT scan results showing pancreatitis and possible venous thrombosis.  Discussed with hospitalist for admission and further evaluation.  Kenton Kingfisher, MD 06/09/19 [phone]

## 2019-06-10 DIAGNOSIS — Z720 Tobacco use: Secondary | ICD-10-CM

## 2019-06-10 DIAGNOSIS — K859 Acute pancreatitis without necrosis or infection, unspecified: Principal | ICD-10-CM

## 2019-06-10 DIAGNOSIS — K861 Other chronic pancreatitis: Secondary | ICD-10-CM | POA: Diagnosis not present

## 2019-06-10 LAB — COMPREHENSIVE METABOLIC PANEL
ALT: 11 U/L (ref 0–44)
AST: 13 U/L — ABNORMAL LOW (ref 15–41)
Albumin: 2.9 g/dL — ABNORMAL LOW (ref 3.5–5.0)
Alkaline Phosphatase: 71 U/L (ref 38–126)
Anion gap: 11 (ref 5–15)
BUN: 8 mg/dL (ref 6–20)
CO2: 17 mmol/L — ABNORMAL LOW (ref 22–32)
Calcium: 7.9 mg/dL — ABNORMAL LOW (ref 8.9–10.3)
Chloride: 106 mmol/L (ref 98–111)
Creatinine, Ser: 0.57 mg/dL — ABNORMAL LOW (ref 0.61–1.24)
GFR calc Af Amer: >60 mL/min (ref >60)
GFR calc non Af Amer: >60 mL/min (ref >60)
Glucose, Bld: 53 mg/dL — ABNORMAL LOW (ref 70–99)
Potassium: 4 mmol/L (ref 3.5–5.1)
Sodium: 134 mmol/L — ABNORMAL LOW (ref 135–145)
Total Bilirubin: 1.7 mg/dL — ABNORMAL HIGH (ref 0.3–1.2)
Total Protein: 5.1 g/dL — ABNORMAL LOW (ref 6.5–8.1)

## 2019-06-10 LAB — CBC WITH DIFFERENTIAL/PLATELET
Abs Immature Granulocytes: 0.03 10*3/uL (ref 0.00–0.07)
Basophils Absolute: 0.1 10*3/uL (ref 0.0–0.1)
Basophils Relative: 1 %
Eosinophils Absolute: 0.1 10*3/uL (ref 0.0–0.5)
Eosinophils Relative: 2 %
HCT: 43.6 % (ref 39.0–52.0)
Hemoglobin: 14.1 g/dL (ref 13.0–17.0)
Immature Granulocytes: 1 %
Lymphocytes Relative: 28 %
Lymphs Abs: 1.7 10*3/uL (ref 0.7–4.0)
MCH: 31.7 pg (ref 26.0–34.0)
MCHC: 32.3 g/dL (ref 30.0–36.0)
MCV: 98 fL (ref 80.0–100.0)
Monocytes Absolute: 0.5 10*3/uL (ref 0.1–1.0)
Monocytes Relative: 9 %
Neutro Abs: 3.6 10*3/uL (ref 1.7–7.7)
Neutrophils Relative %: 59 %
Platelets: 305 10*3/uL (ref 150–400)
RBC: 4.45 MIL/uL (ref 4.22–5.81)
RDW: 14.6 % (ref 11.5–15.5)
WBC: 6 10*3/uL (ref 4.0–10.5)
nRBC: 0 % (ref 0.0–0.2)

## 2019-06-10 LAB — GLUCOSE, CAPILLARY
Glucose-Capillary: 39 mg/dL — CL (ref 70–99)
Glucose-Capillary: 185 mg/dL — ABNORMAL HIGH (ref 70–99)

## 2019-06-10 LAB — LIPASE, BLOOD: Lipase: 29 U/L (ref 11–51)

## 2019-06-10 LAB — MAGNESIUM: Magnesium: 2.1 mg/dL (ref 1.7–2.4)

## 2019-06-10 MED ORDER — ACETAMINOPHEN 325 MG PO TABS
650.0000 mg | ORAL_TABLET | Freq: Four times a day (QID) | ORAL | Status: DC | PRN
  Administered 2019-06-10 (×2): 650 mg via ORAL
  Filled 2019-06-10 (×2): qty 2

## 2019-06-10 MED ORDER — DEXTROSE-NACL 5-0.9 % IV SOLN: INTRAVENOUS | Status: DC

## 2019-06-10 MED ORDER — DEXTROSE 50 % IV SOLN
INTRAVENOUS | Status: AC
  Administered 2019-06-10: 50 mL
  Filled 2019-06-10: qty 50

## 2019-06-10 MED ORDER — HYDROMORPHONE HCL 1 MG/ML IJ SOLN
0.2500 mg | INTRAMUSCULAR | Status: DC | PRN
  Administered 2019-06-10: 0.25 mg via INTRAVENOUS
  Filled 2019-06-10: qty 0.5

## 2019-06-10 NOTE — Discharge Instructions (Signed)
Acute Pancreatitis    Acute pancreatitis happens when the pancreas gets swollen. The pancreas is a large gland in the body that helps to control blood sugar. It also makes enzymes that help to digest food.  This condition can last a few days and cause serious problems. The lungs, heart, and kidneys may stop working.  What are the causes?  Causes include:  · Alcohol abuse.  · Drug abuse.  · Gallstones.  · A tumor in the pancreas.  Other causes include:  · Some medicines.  · Some chemicals.  · Diabetes.  · An infection.  · Damage caused by an accident.  · The poison (venom) from a scorpion bite.  · Belly (abdominal) surgery.  · The body's defense system (immune system) attacking the pancreas (autoimmune pancreatitis).  · Genes that are passed from parent to child (inherited).  In some cases, the cause is not known.  What are the signs or symptoms?  · Pain in the upper belly that may be felt in the back. The pain may be very bad.  · Swelling of the belly.  · Feeling sick to your stomach (nauseous) and throwing up (vomiting).  · Fever.  How is this treated?  You will likely have to stay in the hospital. Treatment may include:  · Pain medicine.  · Fluid through an IV tube.  · Placing a tube in the stomach to take out the stomach contents. This may help you stop throwing up.  · Not eating for 3-4 days.  · Antibiotic medicines, if you have an infection.  · Treating any other problems that may be the cause.  · Steroid medicines, if your problem is caused by your defense system attacking your body's own tissues.  · Surgery.  Follow these instructions at home:  Eating and drinking    · Follow instructions from your doctor about what to eat and drink.  · Eat foods that do not have a lot of fat in them.  · Eat small meals often. Do not eat big meals.  · Drink enough fluid to keep your pee (urine) pale yellow.  · Do not drink alcohol if it caused your condition.  Medicines  · Take over-the-counter and prescription medicines only  as told by your doctor.  · Ask your doctor if the medicine prescribed to you:  ? Requires you to avoid driving or using heavy machinery.  ? Can cause trouble pooping (constipation). You may need to take steps to prevent or treat trouble pooping:  § Take over-the-counter or prescription medicines.  § Eat foods that are high in fiber. These include beans, whole grains, and fresh fruits and vegetables.  § Limit foods that are high in fat and sugar. These include fried or sweet foods.  General instructions  · Do not use any products that contain nicotine or tobacco, such as cigarettes, e-cigarettes, and chewing tobacco. If you need help quitting, ask your doctor.  · Get plenty of rest.  · Check your blood sugar at home as told by your doctor.  · Keep all follow-up visits as told by your doctor. This is important.  Contact a doctor if:  · You do not get better as quickly as expected.  · You have new symptoms.  · Your symptoms get worse.  · You have pain or weakness that lasts a long time.  · You keep feeling sick to your stomach.  · You get better and then you have pain again.  ·   You have a fever.  Get help right away if:  · You cannot eat or keep fluids down.  · Your pain gets very bad.  · Your skin or the white part of your eyes turns yellow.  · You have sudden swelling in your belly.  · You throw up.  · You feel dizzy or you pass out (faint).  · Your blood sugar is high (over 300 mg/dL).  Summary  · Acute pancreatitis happens when the pancreas gets swollen.  · This condition is often caused by alcohol abuse, drug abuse, or gallstones.  · You will likely have to stay in the hospital for treatment.  This information is not intended to replace advice given to you by your health care provider. Make sure you discuss any questions you have with your health care provider.  Document Revised: 11/17/2017 Document Reviewed: 11/17/2017  [employer] Patient Education © 2020 [employer]

## 2019-06-10 NOTE — Discharge Summary (Signed)
Physician Discharge Summary  Brent [name]:[id number] DOB: 1970/06/22 DOA: 06/08/2019  PCP: Gareth Morgan, MD  Admit date: 06/08/2019 Discharge date: 06/10/2019  Admitted From:  Home  Disposition:  Home   Recommendations for Outpatient Follow-up:  1. Follow up with PCP in 1 weeks 2. Follow up with GI in 3 weeks  Discharge Condition: STABLE   CODE STATUS: FULL    Brief Hospitalization Summary: Please see all hospital notes, images, labs for full details of the hospitalization. Brief Narrative:  49 y.o.malewith a prior history of alcohol abuse, currently denies drinking, chronic pancreatitis, multiple hospitalizations for acute exacerbation, chronic opioid dependence, DDD, insomnia, severe GAD with panic attacks, depression, GERD, chronic pancreatic insufficiency and other past medical history detailed below presented to the emergency department with complaints of epigastric abdominal pain nausea and vomiting. He reports that the pain seems worse this time than his other bouts of acute on chronic pancreatitis.   Assessment & Plan:   Principal Problem:   Acute on chronic pancreatitis (HCC) Active Problems:   Chronic back pain   Esophageal dysphagia   Tobacco abuse   Acute pancreatitis   MS (multiple sclerosis) (HCC)   Protein-calorie malnutrition, severe   Question of Mesenteric thrombosis   Abnormal CT of the abdomen   1. Acute on chronic pancreatitis-patient presented with severe abdominal pain requiring IV pain management. He improved with bowel rest.  IV nausea medications ordered. His diet was advanced.  Lipase normal today.  PT REQUESTING TO GO HOME.   HE SAYS HE FEELS MUCH BETTER AND TOLERATED SUPPER.  DC HOME WITH OUTPATIENT FOLLOW UP.  RESUME HOME CREON.  2. Opioid dependence 3. GAD with panic episodes-resumed alprazolam slightly lower dose given that he is on IV opioids at this time. 4. Question of mesenteric venous thrombosis-I reviewed films and discussed  with radiologist Dr. Pia Mau who also reviewed the films with interventional radiologist at this time they do not feel that this is a true thrombosis and likely radiological blurb given the dense thick contrast used. Expectant management. No need to anticoagulate. 5. Severe protein calorie malnutrition-obtain dietitian consultation when able to take p.o. 6. Esophageal dysphagia-IV Protonix ordered IN HOSPITAL. 7. Leukocytosis-RESOLVED.   DVT Prophylaxis:Lovenox Code Status:Full Family Communication:Updated at bedside Disposition Plan:Inpatient for noted above  Status is: Inpatient  Remains inpatient appropriate because:Ongoing active pain requiring inpatient pain management   Dispo: The patient is from: Home  Anticipated d/c is to: Home  Anticipated d/c date is: today  Patient currently is now medically stable to d/c.  Discharge Diagnoses:  Principal Problem:   Acute on chronic pancreatitis (HCC) Active Problems:   Chronic back pain   Esophageal dysphagia   Tobacco abuse   Acute pancreatitis   MS (multiple sclerosis) (HCC)   Protein-calorie malnutrition, severe   Question of Mesenteric thrombosis   Abnormal CT of the abdomen  Discharge Instructions:  Allergies as of 06/10/2019   No Known Allergies     Medication List    TAKE these medications   albuterol 108 (90 Base) MCG/ACT inhaler Commonly known as: VENTOLIN HFA Inhale 1-2 puffs into the lungs every 6 (six) hours as needed for wheezing or shortness of breath.   ALPRAZolam 1 MG tablet Commonly known as: XANAX Take 1 mg by mouth 4 (four) times daily.   Calcium High Potency/Vitamin D 600-200 MG-UNIT Tabs Generic drug: Calcium Carbonate-Vitamin D Take 1 tablet by mouth daily.   Creon 3000-9500 units Cpep Generic drug: Pancrelipase (Lip-Prot-Amyl) Take 1 capsule by  mouth 3 (three) times daily.   gabapentin 600 MG tablet Commonly known as: NEURONTIN Take 600 mg by  mouth 4 (four) times daily.   HYDROcodone-acetaminophen 10-325 MG tablet Commonly known as: NORCO Take 1 tablet by mouth 2 (two) times daily as needed. For pain   ketorolac 0.5 % ophthalmic solution Commonly known as: ACULAR Place 2 drops into both eyes 4 (four) times daily.   natalizumab 300 MG/15ML injection Commonly known as: TYSABRI Inject 300 mg into the vein every 30 (thirty) days.   omeprazole 40 MG capsule Commonly known as: PRILOSEC Take 40 mg by mouth at bedtime.   ondansetron 4 MG tablet Commonly known as: ZOFRAN SMARTSIG:1 Tablet(s) By Mouth 1 to 3 Times Daily   prednisoLONE acetate 1 % ophthalmic suspension Commonly known as: PRED FORTE Place 2 drops into both eyes 2 (two) times daily.   Symbicort 80-4.5 MCG/ACT inhaler Generic drug: budesonide-formoterol Inhale 1 puff into the lungs 2 (two) times daily.   zolpidem 10 MG tablet Commonly known as: AMBIEN Take 10 mg by mouth at bedtime as needed.      Follow-up Information    Gareth Morgan, MD. Schedule an appointment as soon as possible for a visit in 1 week(s).   Specialty: Family Medicine Contact information: [address] [place] [address] [phone]        Corbin Ade, MD. Schedule an appointment as soon as possible for a visit in 1 month(s).   Specialty: Gastroenterology Contact information: [address] [place] [address] [phone]          No Known Allergies Allergies as of 06/10/2019   No Known Allergies     Medication List    TAKE these medications   albuterol 108 (90 Base) MCG/ACT inhaler Commonly known as: VENTOLIN HFA Inhale 1-2 puffs into the lungs every 6 (six) hours as needed for wheezing or shortness of breath.   ALPRAZolam 1 MG tablet Commonly known as: XANAX Take 1 mg by mouth 4 (four) times daily.   Calcium High Potency/Vitamin D 600-200 MG-UNIT Tabs Generic drug: Calcium Carbonate-Vitamin D Take 1 tablet by mouth daily.   Creon  3000-9500 units Cpep Generic drug: Pancrelipase (Lip-Prot-Amyl) Take 1 capsule by mouth 3 (three) times daily.   gabapentin 600 MG tablet Commonly known as: NEURONTIN Take 600 mg by mouth 4 (four) times daily.   HYDROcodone-acetaminophen 10-325 MG tablet Commonly known as: NORCO Take 1 tablet by mouth 2 (two) times daily as needed. For pain   ketorolac 0.5 % ophthalmic solution Commonly known as: ACULAR Place 2 drops into both eyes 4 (four) times daily.   natalizumab 300 MG/15ML injection Commonly known as: TYSABRI Inject 300 mg into the vein every 30 (thirty) days.   omeprazole 40 MG capsule Commonly known as: PRILOSEC Take 40 mg by mouth at bedtime.   ondansetron 4 MG tablet Commonly known as: ZOFRAN SMARTSIG:1 Tablet(s) By Mouth 1 to 3 Times Daily   prednisoLONE acetate 1 % ophthalmic suspension Commonly known as: PRED FORTE Place 2 drops into both eyes 2 (two) times daily.   Symbicort 80-4.5 MCG/ACT inhaler Generic drug: budesonide-formoterol Inhale 1 puff into the lungs 2 (two) times daily.   zolpidem 10 MG tablet Commonly known as: AMBIEN Take 10 mg by mouth at bedtime as needed.       Procedures/Studies: CT Head Wo Contrast  Result Date: 06/09/2019 CLINICAL DATA:  Status post trauma. EXAM: CT HEAD WITHOUT CONTRAST TECHNIQUE: Contiguous axial images were obtained from  the base of the skull through the vertex without intravenous contrast. COMPARISON:  None. FINDINGS: Brain: No evidence of acute infarction, hemorrhage, hydrocephalus, extra-axial collection or mass lesion/mass effect. Vascular: No hyperdense vessel or unexpected calcification. Skull: Normal. Negative for fracture or focal lesion. Sinuses/Orbits: There is marked severity left maxillary sinus mucosal thickening. Mild to moderate severity left ethmoid sinus and very mild left-sided frontal sinus mucosal thickening is also seen. Other: None. IMPRESSION: 1. No acute intracranial abnormality. 2. Left  maxillary sinus, left ethmoid sinus and left-sided frontal sinus disease. Electronically Signed   By: Virgina Norfolk M.D.   On: 06/09/2019 03:17   CT Cervical Spine Wo Contrast  Result Date: 06/09/2019 CLINICAL DATA:  Status post trauma. EXAM: CT CERVICAL SPINE WITHOUT CONTRAST TECHNIQUE: Multidetector CT imaging of the cervical spine was performed without intravenous contrast. Multiplanar CT image reconstructions were also generated. COMPARISON:  None. FINDINGS: Alignment: Normal. Skull base and vertebrae: No acute fracture. No primary bone lesion or focal pathologic process. Soft tissues and spinal canal: No prevertebral fluid or swelling. No visible canal hematoma. Disc levels: Mild to moderate severity endplate sclerosis is seen at the levels of C5-C6 and C6-C7. Moderate severity intervertebral disc space narrowing is also seen at these levels. Mild bilateral multilevel facet joint hypertrophy is seen. Upper chest: Negative. Other: None. IMPRESSION: 1. No acute osseous abnormality. 2. Mild to moderate severity degenerative changes at the levels of C5-C6 and C6-C7. Electronically Signed   By: Virgina Norfolk M.D.   On: 06/09/2019 03:22   CT Abdomen Pelvis W Contrast  Result Date: 06/09/2019 CLINICAL DATA:  Abdominal pain for over a month, worse over the last 3-4 days. EXAM: CT ABDOMEN AND PELVIS WITH CONTRAST TECHNIQUE: Multidetector CT imaging of the abdomen and pelvis was performed using the standard protocol following bolus administration of intravenous contrast. CONTRAST:  140mL OMNIPAQUE IOHEXOL 300 MG/ML  SOLN COMPARISON:  May 13, 2019 FINDINGS: Lower chest: Mild areas of scarring and/or atelectasis are seen within the bilateral lung bases, right greater than left. Hepatobiliary: No focal liver abnormality is seen. No gallstones, gallbladder wall thickening, or biliary dilatation. Pancreas: The pancreatic duct measures approximately 3.5 mm. A very mild amount of peripancreatic inflammatory fat  stranding is seen within the region inferior to the junction of the body and tail of the pancreas. Spleen: Normal in size without focal abnormality. Adrenals/Urinary Tract: Adrenal glands are unremarkable. Kidneys are normal, without renal calculi, focal lesion, or hydronephrosis. Bladder is unremarkable. Stomach/Bowel: Stomach is within normal limits. The appendix is not identified. No evidence of bowel wall thickening, distention, or inflammatory changes. Vascular/Lymphatic: There is marked severity calcification of the abdominal aorta without evidence of aneurysmal dilatation or dissection. A thin, linear area of intraluminal low attenuation is seen within the mid and distal portions of the inferior mesenteric vein. This is surrounded by normal intraluminal contrast enhancement and extends to its level of anastomosis within the portal venous system. (Axial CT image 26 through 46, CT series number 2/coronal reformatted images 26 through 39, CT series number 5). This represents a new finding when compared to the prior study. No enlarged abdominal or pelvic lymph nodes. Reproductive: The prostate gland is mildly enlarged. Other: No abdominal wall hernia or abnormality. No abdominopelvic ascites. Musculoskeletal: Marked severity degenerative changes are seen at the level of L4-L5 with a chronic deformity is seen along the superior endplate of the L2 vertebral body. IMPRESSION: 1. Very mild amount of peripancreatic inflammatory fat stranding, as described above, which may  represent mild acute pancreatitis. Correlation with pancreatic enzymes is recommended. 2. Findings worrisome for partial intraluminal thrombus within the mid and distal portions of the inferior mesenteric vein. Further correlation with ultrasound of the abdomen with venous duplex is recommended. 3. Marked severity degenerative changes at the level of L4-L5 with a chronic deformity along the superior endplate of the L2 vertebral body. 4. Aortic  atherosclerosis. Aortic Atherosclerosis (ICD10-I70.0). Electronically Signed   By: Aram Candela M.D.   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 EXAM: DOPPLER ULTRASOUND OF THE abdominal venous structures. TECHNIQUE: Color and spectral Doppler ultrasound was utilized to evaluate blood flow of several abdominal venous structures. COMPARISON:  CT of same day. FINDINGS: This exam is limited due to overlying bowel gas. The visualized portion of IVC appears to be widely patent. There does appear to be blood flow within the proximal portion of the inferior mesenteric vein. However, no flow is noted in its middle and distal portions suggesting the possibility of thrombus. IMPRESSION: Exam is limited due to overlying bowel gas. Visualized portion of IVC is widely patent. Doppler does not demonstrate definite flow within the middle and distal portions of the inferior mesenteric vein suggesting the possibility of thrombosis. Electronically Signed   By: Lupita Raider M.D.   On: 06/09/2019 10:23      Subjective: PT SAYS HE FEELS BETTER AND WANTS TO GO HOME. HE TOLERATED SUPPER WELL AND HAVING NO ABDOMINAL PAIN.   Discharge Exam: Vitals:   06/10/19 0745 06/10/19 1350  BP:  103/71  Pulse:  74  Resp:  20  Temp:  98.1 F (36.7 C)  SpO2: 94% 97%   Vitals:   06/10/19 0218 06/10/19 0600 06/10/19 0745 06/10/19 1350  BP: 117/76 102/80  103/71  Pulse: 88 65  74  Resp: 20 16  20   Temp: 98.9 F (37.2 C) 98.3 F (36.8 C)  98.1 F (36.7 C)  TempSrc: Oral     SpO2: 100% 98% 94% 97%  Weight:      Height:        General: Pt is alert, awake, not in acute distress Cardiovascular: RRR, S1/S2 +, no rubs, no gallops Respiratory: CTA bilaterally, no wheezing, no rhonchi Abdominal: Soft, NT, ND, bowel sounds + Extremities: no edema, no cyanosis   The results of significant diagnostics  from this hospitalization (including imaging, microbiology, ancillary and laboratory) are listed below for reference.     Microbiology: Recent Results (from the past 240 hour(s))  Respiratory Panel by RT PCR (Flu A&B, Covid) - Nasopharyngeal Swab     Status: None   Collection Time: 06/09/19 12:26 AM   Specimen: Nasopharyngeal Swab  Result Value Ref Range Status   SARS Coronavirus 2 by RT PCR NEGATIVE NEGATIVE Final    Comment: (NOTE) SARS-CoV-2 target nucleic acids are NOT DETECTED. The SARS-CoV-2 RNA is generally detectable in upper respiratoy specimens during the acute phase of infection. The lowest concentration of SARS-CoV-2 viral copies this assay can detect is 131 copies/mL. A negative result does not preclude SARS-Cov-2 infection and should not be used as the sole basis for treatment or other patient management decisions. A negative result may occur with  improper specimen collection/handling, submission of specimen other than nasopharyngeal swab, presence of viral mutation(s) within the areas targeted by this assay, and inadequate number of viral copies (<131 copies/mL). A negative result must be combined with  clinical observations, patient history, and epidemiological information. The expected result is Negative. Fact Sheet for Patients:  https://www.moore.com/ Fact Sheet for Healthcare Providers:  https://www.young.biz/ This test is not yet ap proved or cleared by the [place] FDA and  has been authorized for detection and/or diagnosis of SARS-CoV-2 by FDA under an Emergency Use Authorization (EUA). This EUA will remain  in effect (meaning this test can be used) for the duration of the COVID-19 declaration under Section 564(b)(1) of the Act, 21 U.S.C. section 360bbb-3(b)(1), unless the authorization is terminated or revoked sooner.    Influenza A by PCR NEGATIVE NEGATIVE Final   Influenza B by PCR NEGATIVE NEGATIVE Final     Comment: (NOTE) The Xpert Xpress SARS-CoV-2/FLU/RSV assay is intended as an aid in  the diagnosis of influenza from Nasopharyngeal swab specimens and  should not be used as a sole basis for treatment. Nasal washings and  aspirates are unacceptable for Xpert Xpress SARS-CoV-2/FLU/RSV  testing. Fact Sheet for Patients: https://www.moore.com/ Fact Sheet for Healthcare Providers: https://www.young.biz/ This test is not yet approved or cleared by the [place] FDA and  has been authorized for detection and/or diagnosis of SARS-CoV-2 by  FDA under an Emergency Use Authorization (EUA). This EUA will remain  in effect (meaning this test can be used) for the duration of the  Covid-19 declaration under Section 564(b)(1) of the Act, 21  U.S.C. section 360bbb-3(b)(1), unless the authorization is  terminated or revoked. Performed at [facility], [address], [place], [place] [address]      Labs: BNP (last 3 results) No results for input(s): BNP in the last 8760 hours. Basic Metabolic Panel: Recent Labs  Lab 06/08/19 2122 06/09/19 1449 06/10/19 0542  NA 136  --  134*  K 3.7  --  4.0  CL 103  --  106  CO2 23  --  17*  GLUCOSE 92  --  53*  BUN 8  --  8  CREATININE 0.61 0.59* 0.57*  CALCIUM 8.4*  --  7.9*  MG  --   --  2.1   Liver Function Tests: Recent Labs  Lab 06/08/19 2122 06/10/19 0542  AST 15 13*  ALT 12 11  ALKPHOS 94 71  BILITOT 0.8 1.7*  PROT 6.5 5.1*  ALBUMIN 3.4* 2.9*   Recent Labs  Lab 06/08/19 2122 06/10/19 0542  LIPASE 132* 29   No results for input(s): AMMONIA in the last 168 hours. CBC: Recent Labs  Lab 06/08/19 2122 06/09/19 1449 06/10/19 0542  WBC 12.7* 7.6 6.0  NEUTROABS  --   --  3.6  HGB 17.3* 14.8 14.1  HCT 52.5* 45.7 43.6  MCV 95.8 97.4 98.0  PLT 369 318 305   Cardiac Enzymes: No results for input(s): CKTOTAL, CKMB, CKMBINDEX, TROPONINI in the last 168 hours. BNP: Invalid input(s):  POCBNP CBG: Recent Labs  Lab 06/10/19 0816 06/10/19 0940  GLUCAP 39* 185*   D-Dimer No results for input(s): DDIMER in the last 72 hours. Hgb A1c Recent Labs    06/09/19 1449  HGBA1C 5.2   Lipid Profile No results for input(s): CHOL, HDL, LDLCALC, TRIG, CHOLHDL, LDLDIRECT in the last 72 hours. Thyroid function studies No results for input(s): TSH, T4TOTAL, T3FREE, THYROIDAB in the last 72 hours.  Invalid input(s): FREET3 Anemia work up No results for input(s): VITAMINB12, FOLATE, FERRITIN, TIBC, IRON, RETICCTPCT in the last 72 hours. Urinalysis    Component Value Date/Time   COLORURINE YELLOW 06/09/2019 0401   APPEARANCEUR CLEAR 06/09/2019  0401   LABSPEC 1.009 06/09/2019 0401   PHURINE 6.0 06/09/2019 0401   GLUCOSEU NEGATIVE 06/09/2019 0401   HGBUR NEGATIVE 06/09/2019 0401   BILIRUBINUR NEGATIVE 06/09/2019 0401   KETONESUR NEGATIVE 06/09/2019 0401   PROTEINUR NEGATIVE 06/09/2019 0401   UROBILINOGEN 0.2 11/06/2014 2345   NITRITE NEGATIVE 06/09/2019 0401   LEUKOCYTESUR NEGATIVE 06/09/2019 0401   Sepsis Labs Invalid input(s): PROCALCITONIN,  WBC,  LACTICIDVEN Microbiology Recent Results (from the past 240 hour(s))  Respiratory Panel by RT PCR (Flu A&B, Covid) - Nasopharyngeal Swab     Status: None   Collection Time: 06/09/19 12:26 AM   Specimen: Nasopharyngeal Swab  Result Value Ref Range Status   SARS Coronavirus 2 by RT PCR NEGATIVE NEGATIVE Final    Comment: (NOTE) SARS-CoV-2 target nucleic acids are NOT DETECTED. The SARS-CoV-2 RNA is generally detectable in upper respiratoy specimens during the acute phase of infection. The lowest concentration of SARS-CoV-2 viral copies this assay can detect is 131 copies/mL. A negative result does not preclude SARS-Cov-2 infection and should not be used as the sole basis for treatment or other patient management decisions. A negative result may occur with  improper specimen collection/handling, submission of specimen  other than nasopharyngeal swab, presence of viral mutation(s) within the areas targeted by this assay, and inadequate number of viral copies (<131 copies/mL). A negative result must be combined with clinical observations, patient history, and epidemiological information. The expected result is Negative. Fact Sheet for Patients:  https://www.moore.com/ Fact Sheet for Healthcare Providers:  https://www.young.biz/ This test is not yet ap proved or cleared by the [place] FDA and  has been authorized for detection and/or diagnosis of SARS-CoV-2 by FDA under an Emergency Use Authorization (EUA). This EUA will remain  in effect (meaning this test can be used) for the duration of the COVID-19 declaration under Section 564(b)(1) of the Act, 21 U.S.C. section 360bbb-3(b)(1), unless the authorization is terminated or revoked sooner.    Influenza A by PCR NEGATIVE NEGATIVE Final   Influenza B by PCR NEGATIVE NEGATIVE Final    Comment: (NOTE) The Xpert Xpress SARS-CoV-2/FLU/RSV assay is intended as an aid in  the diagnosis of influenza from Nasopharyngeal swab specimens and  should not be used as a sole basis for treatment. Nasal washings and  aspirates are unacceptable for Xpert Xpress SARS-CoV-2/FLU/RSV  testing. Fact Sheet for Patients: https://www.moore.com/ Fact Sheet for Healthcare Providers: https://www.young.biz/ This test is not yet approved or cleared by the [place] FDA and  has been authorized for detection and/or diagnosis of SARS-CoV-2 by  FDA under an Emergency Use Authorization (EUA). This EUA will remain  in effect (meaning this test can be used) for the duration of the  Covid-19 declaration under Section 564(b)(1) of the Act, 21  U.S.C. section 360bbb-3(b)(1), unless the authorization is  terminated or revoked. Performed at [facility], [address], [place], [place] [address]    Time  coordinating discharge:   SIGNED:  Standley Dakins, MD  [facility] 06/10/2019, 5:47 PM How to contact the [facility] Attending or Consulting provider 7A - 7P or covering provider during after hours 7P -7A, for this patient?  1. Check the care team in [facility] and look for a) attending/consulting [facility] provider listed and b) the [facility] team listed 2. Log into [email or web address] and use [facility]'s universal password to access. If you do not have the password, please contact the hospital operator. 3. Locate the [facility] provider you are looking for under [facility] and page  to a number that you can be directly reached. 4. If you still have difficulty reaching the provider, please page the [facility] (Director on Call) for the Hospitalists listed on amion for assistance.

## 2019-06-10 NOTE — Progress Notes (Signed)
Has tolerated clear liquid diet today, no vomiting and requesting pain medication no more often than when npo.  No vomiting.  Has been sleeping mostly

## 2019-06-10 NOTE — Progress Notes (Addendum)
Tolerated regular diet for supper.  IV removed and to be discharged home. Discharge papers reviewed and no changes in medication.  To follow up with primary and stated he has a GI doctor already.  Taken by wc to entrance.  Ride home is here

## 2019-06-10 NOTE — Progress Notes (Signed)
Subjective:  Denies abdominal pain. Feels hungry. +flatus.   Objective: Vital signs in last 24 hours: Temp:  [97.9 F (36.6 C)-99.5 F (37.5 C)] 98.3 F (36.8 C) (04/29 0600) Pulse Rate:  [62-88] 65 (04/29 0600) Resp:  [16-20] 16 (04/29 0600) BP: (96-117)/(68-81) 102/80 (04/29 0600) SpO2:  [91 %-100 %] 94 % (04/29 0745)   General:   Alert,  Well-developed, well-nourished, pleasant and cooperative in NAD Head:  Normocephalic and atraumatic. Eyes:  Sclera clear, no icterus.  Abdomen:  Soft, nontender and nondistended.  Normal bowel sounds, without guarding, and without rebound.   Extremities:  Without clubbing, deformity or edema. Neurologic:  Alert and  oriented x4;  grossly normal neurologically. Skin:  Intact without significant lesions or rashes. Psych:  Alert and cooperative. Normal mood and affect.  Intake/Output from previous day: 04/28 0701 - 04/29 0700 In: 1172.8 [I.V.:1172.8] Out: -  Intake/Output this shift: No intake/output data recorded.  Lab Results: CBC Recent Labs    06/08/19 2122 06/09/19 1449 06/10/19 0542  WBC 12.7* 7.6 6.0  HGB 17.3* 14.8 14.1  HCT 52.5* 45.7 43.6  MCV 95.8 97.4 98.0  PLT 369 318 305   BMET Recent Labs    06/08/19 2122 06/09/19 1449 06/10/19 0542  NA 136  --  134*  K 3.7  --  4.0  CL 103  --  106  CO2 23  --  17*  GLUCOSE 92  --  53*  BUN 8  --  8  CREATININE 0.61 0.59* 0.57*  CALCIUM 8.4*  --  7.9*   LFTs Recent Labs    06/08/19 2122 06/10/19 0542  BILITOT 0.8 1.7*  ALKPHOS 94 71  AST 15 13*  ALT 12 11  PROT 6.5 5.1*  ALBUMIN 3.4* 2.9*   Recent Labs    06/08/19 2122 06/10/19 0542  LIPASE 132* 29   PT/INR No results for input(s): LABPROT, INR in the last 72 hours.    Imaging Studies: CT Head Wo Contrast  Result Date: 06/09/2019 CLINICAL DATA:  Status post trauma. EXAM: CT HEAD WITHOUT CONTRAST TECHNIQUE: Contiguous axial images were obtained from the base of the skull through the vertex without  intravenous contrast. COMPARISON:  None. FINDINGS: Brain: No evidence of acute infarction, hemorrhage, hydrocephalus, extra-axial collection or mass lesion/mass effect. Vascular: No hyperdense vessel or unexpected calcification. Skull: Normal. Negative for fracture or focal lesion. Sinuses/Orbits: There is marked severity left maxillary sinus mucosal thickening. Mild to moderate severity left ethmoid sinus and very mild left-sided frontal sinus mucosal thickening is also seen. Other: None. IMPRESSION: 1. No acute intracranial abnormality. 2. Left maxillary sinus, left ethmoid sinus and left-sided frontal sinus disease. Electronically Signed   By: Virgina Norfolk M.D.   On: 06/09/2019 03:17   CT Cervical Spine Wo Contrast  Result Date: 06/09/2019 CLINICAL DATA:  Status post trauma. EXAM: CT CERVICAL SPINE WITHOUT CONTRAST TECHNIQUE: Multidetector CT imaging of the cervical spine was performed without intravenous contrast. Multiplanar CT image reconstructions were also generated. COMPARISON:  None. FINDINGS: Alignment: Normal. Skull base and vertebrae: No acute fracture. No primary bone lesion or focal pathologic process. Soft tissues and spinal canal: No prevertebral fluid or swelling. No visible canal hematoma. Disc levels: Mild to moderate severity endplate sclerosis is seen at the levels of C5-C6 and C6-C7. Moderate severity intervertebral disc space narrowing is also seen at these levels. Mild bilateral multilevel facet joint hypertrophy is seen. Upper chest: Negative. Other: None. IMPRESSION: 1. No acute osseous abnormality. 2. Mild to  moderate severity degenerative changes at the levels of C5-C6 and C6-C7. Electronically Signed   By: Aram Candela M.D.   On: 06/09/2019 03:22   CT Abdomen Pelvis W Contrast  Result Date: 06/09/2019 CLINICAL DATA:  Abdominal pain for over a month, worse over the last 3-4 days. EXAM: CT ABDOMEN AND PELVIS WITH CONTRAST TECHNIQUE: Multidetector CT imaging of the  abdomen and pelvis was performed using the standard protocol following bolus administration of intravenous contrast. CONTRAST:  OMNIPAQUE IOHEXOL 300 MG/ML  SOLN COMPARISON:  May 13, 2019 FINDINGS: Lower chest: Mild areas of scarring and/or atelectasis are seen within the bilateral lung bases, right greater than left. Hepatobiliary: No focal liver abnormality is seen. No gallstones, gallbladder wall thickening, or biliary dilatation. Pancreas: The pancreatic duct measures approximately 3.5 mm. A very mild amount of peripancreatic inflammatory fat stranding is seen within the region inferior to the junction of the body and tail of the pancreas. Spleen: Normal in size without focal abnormality. Adrenals/Urinary Tract: Adrenal glands are unremarkable. Kidneys are normal, without renal calculi, focal lesion, or hydronephrosis. Bladder is unremarkable. Stomach/Bowel: Stomach is within normal limits. The appendix is not identified. No evidence of bowel wall thickening, distention, or inflammatory changes. Vascular/Lymphatic: There is marked severity calcification of the abdominal aorta without evidence of aneurysmal dilatation or dissection. A thin, linear area of intraluminal low attenuation is seen within the mid and distal portions of the inferior mesenteric vein. This is surrounded by normal intraluminal contrast enhancement and extends to its level of anastomosis within the portal venous system. (Axial CT image 26 through 46, CT series number 2/coronal reformatted images 26 through 39, CT series number 5). This represents a new finding when compared to the prior study. No enlarged abdominal or pelvic lymph nodes. Reproductive: The prostate gland is mildly enlarged. Other: No abdominal wall hernia or abnormality. No abdominopelvic ascites. Musculoskeletal: Marked severity degenerative changes are seen at the level of L4-L5 with a chronic deformity is seen along the superior endplate of the L2 vertebral body.  IMPRESSION: 1. Very mild amount of peripancreatic inflammatory fat stranding, as described above, which may represent mild acute pancreatitis. Correlation with pancreatic enzymes is recommended. 2. Findings worrisome for partial intraluminal thrombus within the mid and distal portions of the inferior mesenteric vein. Further correlation with ultrasound of the abdomen with venous duplex is recommended. 3. Marked severity degenerative changes at the level of L4-L5 with a chronic deformity along the superior endplate of the L2 vertebral body. 4. Aortic atherosclerosis. Aortic Atherosclerosis (ICD10-I70.0). Electronically Signed   By: Aram Candela M.D.   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 EXAM: DOPPLER ULTRASOUND OF THE abdominal venous structures. TECHNIQUE: Color and spectral Doppler ultrasound was utilized to evaluate blood flow of several abdominal venous structures. COMPARISON:  CT of same day. FINDINGS: This exam is limited due to overlying bowel gas. The visualized portion of IVC appears to be widely patent. There does appear to be blood flow within the proximal portion of the inferior mesenteric vein. However, no flow is noted in its middle and distal portions suggesting the possibility of thrombus. IMPRESSION: Exam is limited due to overlying bowel gas. Visualized portion of IVC is widely patent. Doppler does not demonstrate definite flow within the middle and distal portions of the inferior mesenteric vein suggesting the possibility of thrombosis. Electronically Signed   By: Lupita Raider  M.D.   On: 06/09/2019 10:23  [2 weeks]   Assessment: 49 year old male with past medical history significant for prior alcohol abuse, chronic opioid dependence, severe GAD with panic attacks, depression, chronic pancreatic insufficiency, chronic pancreatitis, prior history of large  pancreatic pseudocyst requiring surgery presenting with greater than 1 month history of abdominal pain, progressively worsening over the past couple of weeks.  Acute on chronic pancreatitis: Elevated lipase in the 130s, CT consistent with mild acute on chronic pancreatitis.  Resolution of pseudocyst status post surgery at [facility].  Denies alcohol use in 1 year.Trig unremarkable previously. Abdominal pain resolved per patient and feels hungry.  Initial concern for inferior mesenteric vein thrombosis: CT findings related to his small bowel were unremarkable including no bowel wall edema or inflammation.  Case was discussed with Dr. Tyron Russell and interventional radiology who both feel the area of concern in the IMV is likely a filling defect.  Additionally other large venous vessels are widely patent.  Plan: 1. Supportive measures for pancreatitis. 2. Clear liquid diet.  3. Will continue to follow with you.   Leanna Battles. [facility] [phone] 4/29/202110:38 AM     LOS: 1 day

## 2019-06-10 NOTE — Progress Notes (Signed)
PROGRESS NOTE   Brent [name]  PJA:[id number] DOB: [birth year] DOA: 06/08/2019 PCP: Lemmie Evens, MD   Chief Complaint  Patient presents with  . Abdominal Pain    Brief Narrative:  49 y.o. male with a prior history of alcohol abuse, currently denies drinking, chronic pancreatitis, multiple hospitalizations for acute exacerbation, chronic opioid dependence, DDD, insomnia, severe GAD with panic attacks, depression, GERD, chronic pancreatic insufficiency and other past medical history detailed below presented to the emergency department with complaints of epigastric abdominal pain nausea and vomiting.  He reports that the pain seems worse this time than his other bouts of acute on chronic pancreatitis.   Assessment & Plan:   Principal Problem:   Acute on chronic pancreatitis (HCC) Active Problems:   Chronic back pain   Esophageal dysphagia   Tobacco abuse   Acute pancreatitis   MS (multiple sclerosis) (HCC)   Protein-calorie malnutrition, severe   Question of Mesenteric thrombosis   Abnormal CT of the abdomen   1. Acute on chronic pancreatitis-patient presented with severe abdominal pain requiring IV pain management.  He is improving with bowel rest.  IV nausea medications ordered. His diet is being advanced.  Lipase normal today.  If tolerates diet, plan DC home tomorrow.   2. Opioid dependence-IV pain medications ordered but doses reduced as he is improving. 3. GAD with panic episodes-resumed alprazolam slightly lower dose given that he is on IV opioids at this time. 4. Question of mesenteric venous thrombosis-I reviewed films and discussed with radiologist Dr. Darcella Cheshire who also reviewed the films with interventional radiologist at this time they do not feel that this is a true thrombosis and likely radiological blurb given the dense thick contrast used.  Expectant management.  No need to anticoagulate. 5. Severe protein calorie malnutrition-obtain dietitian consultation when able to  take p.o. 6. Esophageal dysphagia-IV Protonix ordered. 7. Leukocytosis-likely reactive will follow with daily CBC with differential.  DVT Prophylaxis: Lovenox Code Status: Full Family Communication: Updated at bedside Disposition Plan: Inpatient for noted above  Status is: Inpatient  Remains inpatient appropriate because:Ongoing active pain requiring inpatient pain management   Dispo: The patient is from: Home              Anticipated d/c is to: Home              Anticipated d/c date is: 1 day              Patient currently is not medically stable to d/c.        Consultants:   GI   Procedures:     Antimicrobials:     Subjective: Pt reports that he is having much less abdominal pain today.  He would like to advance diet further today.   Objective: Vitals:   06/10/19 0218 06/10/19 0600 06/10/19 0745 06/10/19 1350  BP: 117/76 102/80  103/71  Pulse: 88 65  74  Resp: 20 16  20   Temp: 98.9 F (37.2 C) 98.3 F (36.8 C)  98.1 F (36.7 C)  TempSrc: Oral     SpO2: 100% 98% 94% 97%  Weight:      Height:        Intake/Output Summary (Last 24 hours) at 06/10/2019 1456 Last data filed at 06/10/2019 1351 Gross per 24 hour  Intake --  Output 850 ml  Net -850 ml   Filed Weights   06/08/19 2020  Weight: 49 kg    Examination:  General exam: Appears calm and comfortable  Respiratory  system: Clear to auscultation. Respiratory effort normal. Cardiovascular system: S1 & S2 heard, RRR. No JVD, murmurs, rubs, gallops or clicks. No pedal edema. Gastrointestinal system: Abdomen is nondistended, soft and mild epigastric tenderness, no guarding. No organomegaly or masses felt. Normal bowel sounds heard. Central nervous system: Alert and oriented. No focal neurological deficits. Extremities: Symmetric 5 x 5 power. Skin: No rashes, lesions or ulcers Psychiatry: Judgement and insight appear normal. Mood & affect appropriate.   Data Reviewed: I have personally reviewed  following labs and imaging studies  CBC: Recent Labs  Lab 06/08/19 2122 06/09/19 1449 06/10/19 0542  WBC 12.7* 7.6 6.0  NEUTROABS  --   --  3.6  HGB 17.3* 14.8 14.1  HCT 52.5* 45.7 43.6  MCV 95.8 97.4 98.0  PLT 369 318 305    Basic Metabolic Panel: Recent Labs  Lab 06/08/19 2122 06/09/19 1449 06/10/19 0542  NA 136  --  134*  K 3.7  --  4.0  CL 103  --  106  CO2 23  --  17*  GLUCOSE 92  --  53*  BUN 8  --  8  CREATININE 0.61 0.59* 0.57*  CALCIUM 8.4*  --  7.9*  MG  --   --  2.1    GFR: Estimated Creatinine Clearance: 76.6 mL/min (A) (by C-G formula based on SCr of 0.57 mg/dL (L)).  Liver Function Tests: Recent Labs  Lab 06/08/19 2122 06/10/19 0542  AST 15 13*  ALT 12 11  ALKPHOS 94 71  BILITOT 0.8 1.7*  PROT 6.5 5.1*  ALBUMIN 3.4* 2.9*    CBG: Recent Labs  Lab 06/10/19 0816 06/10/19 0940  GLUCAP 39* 185*     Recent Results (from the past 240 hour(s))  Respiratory Panel by RT PCR (Flu A&B, Covid) - Nasopharyngeal Swab     Status: None   Collection Time: 06/09/19 12:26 AM   Specimen: Nasopharyngeal Swab  Result Value Ref Range Status   SARS Coronavirus 2 by RT PCR NEGATIVE NEGATIVE Final    Comment: (NOTE) SARS-CoV-2 target nucleic acids are NOT DETECTED. The SARS-CoV-2 RNA is generally detectable in upper respiratoy specimens during the acute phase of infection. The lowest concentration of SARS-CoV-2 viral copies this assay can detect is 131 copies/mL. A negative result does not preclude SARS-Cov-2 infection and should not be used as the sole basis for treatment or other patient management decisions. A negative result may occur with  improper specimen collection/handling, submission of specimen other than nasopharyngeal swab, presence of viral mutation(s) within the areas targeted by this assay, and inadequate number of viral copies (<131 copies/mL). A negative result must be combined with clinical observations, patient history, and  epidemiological information. The expected result is Negative. Fact Sheet for Patients:  https://www.moore.com/ Fact Sheet for Healthcare Providers:  https://www.young.biz/ This test is not yet ap proved or cleared by the [place] FDA and  has been authorized for detection and/or diagnosis of SARS-CoV-2 by FDA under an Emergency Use Authorization (EUA). This EUA will remain  in effect (meaning this test can be used) for the duration of the COVID-19 declaration under Section 564(b)(1) of the Act, 21 U.S.C. section 360bbb-3(b)(1), unless the authorization is terminated or revoked sooner.    Influenza A by PCR NEGATIVE NEGATIVE Final   Influenza B by PCR NEGATIVE NEGATIVE Final    Comment: (NOTE) The Xpert Xpress SARS-CoV-2/FLU/RSV assay is intended as an aid in  the diagnosis of influenza from Nasopharyngeal swab specimens and  should not  be used as a sole basis for treatment. Nasal washings and  aspirates are unacceptable for Xpert Xpress SARS-CoV-2/FLU/RSV  testing. Fact Sheet for Patients: https://www.moore.com/ Fact Sheet for Healthcare Providers: https://www.young.biz/ This test is not yet approved or cleared by the [place] FDA and  has been authorized for detection and/or diagnosis of SARS-CoV-2 by  FDA under an Emergency Use Authorization (EUA). This EUA will remain  in effect (meaning this test can be used) for the duration of the  Covid-19 declaration under Section 564(b)(1) of the Act, 21  U.S.C. section 360bbb-3(b)(1), unless the authorization is  terminated or revoked. Performed at [facility], [address] [place], [place] [address]          Radiology Studies: CT Head Wo Contrast  Result Date: 06/09/2019 CLINICAL DATA:  Status post trauma. EXAM: CT HEAD WITHOUT CONTRAST TECHNIQUE: Contiguous axial images were obtained from the base of the skull through the vertex without  intravenous contrast. COMPARISON:  None. FINDINGS: Brain: No evidence of acute infarction, hemorrhage, hydrocephalus, extra-axial collection or mass lesion/mass effect. Vascular: No hyperdense vessel or unexpected calcification. Skull: Normal. Negative for fracture or focal lesion. Sinuses/Orbits: There is marked severity left maxillary sinus mucosal thickening. Mild to moderate severity left ethmoid sinus and very mild left-sided frontal sinus mucosal thickening is also seen. Other: None. IMPRESSION: 1. No acute intracranial abnormality. 2. Left maxillary sinus, left ethmoid sinus and left-sided frontal sinus disease. Electronically Signed   By: Aram Candela M.D.   On: 06/09/2019 03:17   CT Cervical Spine Wo Contrast  Result Date: 06/09/2019 CLINICAL DATA:  Status post trauma. EXAM: CT CERVICAL SPINE WITHOUT CONTRAST TECHNIQUE: Multidetector CT imaging of the cervical spine was performed without intravenous contrast. Multiplanar CT image reconstructions were also generated. COMPARISON:  None. FINDINGS: Alignment: Normal. Skull base and vertebrae: No acute fracture. No primary bone lesion or focal pathologic process. Soft tissues and spinal canal: No prevertebral fluid or swelling. No visible canal hematoma. Disc levels: Mild to moderate severity endplate sclerosis is seen at the levels of C5-C6 and C6-C7. Moderate severity intervertebral disc space narrowing is also seen at these levels. Mild bilateral multilevel facet joint hypertrophy is seen. Upper chest: Negative. Other: None. IMPRESSION: 1. No acute osseous abnormality. 2. Mild to moderate severity degenerative changes at the levels of C5-C6 and C6-C7. Electronically Signed   By: Aram Candela M.D.   On: 06/09/2019 03:22   CT Abdomen Pelvis W Contrast  Result Date: 06/09/2019 CLINICAL DATA:  Abdominal pain for over a month, worse over the last 3-4 days. EXAM: CT ABDOMEN AND PELVIS WITH CONTRAST TECHNIQUE: Multidetector CT imaging of the  abdomen and pelvis was performed using the standard protocol following bolus administration of intravenous contrast. CONTRAST:  OMNIPAQUE IOHEXOL 300 MG/ML  SOLN COMPARISON:  May 13, 2019 FINDINGS: Lower chest: Mild areas of scarring and/or atelectasis are seen within the bilateral lung bases, right greater than left. Hepatobiliary: No focal liver abnormality is seen. No gallstones, gallbladder wall thickening, or biliary dilatation. Pancreas: The pancreatic duct measures approximately 3.5 mm. A very mild amount of peripancreatic inflammatory fat stranding is seen within the region inferior to the junction of the body and tail of the pancreas. Spleen: Normal in size without focal abnormality. Adrenals/Urinary Tract: Adrenal glands are unremarkable. Kidneys are normal, without renal calculi, focal lesion, or hydronephrosis. Bladder is unremarkable. Stomach/Bowel: Stomach is within normal limits. The appendix is not identified. No evidence of bowel wall thickening, distention, or inflammatory changes. Vascular/Lymphatic:  There is marked severity calcification of the abdominal aorta without evidence of aneurysmal dilatation or dissection. A thin, linear area of intraluminal low attenuation is seen within the mid and distal portions of the inferior mesenteric vein. This is surrounded by normal intraluminal contrast enhancement and extends to its level of anastomosis within the portal venous system. (Axial CT image 26 through 46, CT series number 2/coronal reformatted images 26 through 39, CT series number 5). This represents a new finding when compared to the prior study. No enlarged abdominal or pelvic lymph nodes. Reproductive: The prostate gland is mildly enlarged. Other: No abdominal wall hernia or abnormality. No abdominopelvic ascites. Musculoskeletal: Marked severity degenerative changes are seen at the level of L4-L5 with a chronic deformity is seen along the superior endplate of the L2 vertebral body.  IMPRESSION: 1. Very mild amount of peripancreatic inflammatory fat stranding, as described above, which may represent mild acute pancreatitis. Correlation with pancreatic enzymes is recommended. 2. Findings worrisome for partial intraluminal thrombus within the mid and distal portions of the inferior mesenteric vein. Further correlation with ultrasound of the abdomen with venous duplex is recommended. 3. Marked severity degenerative changes at the level of L4-L5 with a chronic deformity along the superior endplate of the L2 vertebral body. 4. Aortic atherosclerosis. Aortic Atherosclerosis (ICD10-I70.0). Electronically Signed   By: Aram Candela M.D.   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 EXAM: DOPPLER ULTRASOUND OF THE abdominal venous structures. TECHNIQUE: Color and spectral Doppler ultrasound was utilized to evaluate blood flow of several abdominal venous structures. COMPARISON:  CT of same day. FINDINGS: This exam is limited due to overlying bowel gas. The visualized portion of IVC appears to be widely patent. There does appear to be blood flow within the proximal portion of the inferior mesenteric vein. However, no flow is noted in its middle and distal portions suggesting the possibility of thrombus. IMPRESSION: Exam is limited due to overlying bowel gas. Visualized portion of IVC is widely patent. Doppler does not demonstrate definite flow within the middle and distal portions of the inferior mesenteric vein suggesting the possibility of thrombosis. Electronically Signed   By: Lupita Raider M.D.   On: 06/09/2019 10:23   Scheduled Meds: . enoxaparin (LOVENOX) injection  40 mg Subcutaneous Q24H  . ketorolac  2 drop Both Eyes QID  . mometasone-formoterol  2 puff Inhalation BID  . nicotine  21 mg Transdermal Daily  . pantoprazole (PROTONIX) IV  40 mg Intravenous Q24H  .  prednisoLONE acetate  2 drop Both Eyes BID   Continuous Infusions: . dextrose 5 % and 0.9% NaCl 75 mL/hr at 06/10/19 0825     LOS: 1 day    Time spent: 25 mins   Laketra Bowdish Laural Benes, MD [facility]   To contact the attending provider between 7A-7P or the covering provider during after hours 7P-7A, please log into the web site [email or web address] and access using universal [place] password for that web site. If you do not have the password, please call the hospital operator.  06/10/2019, 2:56 PM

## 2019-06-10 NOTE — Progress Notes (Signed)
06/10/2019 5:46 PM   Pt is now saying he wants to go home.  He has tolerated his diet and not having pain.  Will DC home. Follow up outpatient.  See DC summary.   Meng Winterton [employer]

## 2019-09-15 ENCOUNTER — Inpatient Hospital Stay (HOSPITAL_COMMUNITY)
Admission: EM | Admit: 2019-09-15 | Discharge: 2019-09-17 | DRG: 441 | Disposition: A | Discharge status: Home / Self Care | Payer: Medicare Other | Attending: Family Medicine | Admitting: Family Medicine

## 2019-09-15 ENCOUNTER — Encounter (HOSPITAL_COMMUNITY): Payer: Self-pay

## 2019-09-15 ENCOUNTER — Other Ambulatory Visit: Payer: Self-pay

## 2019-09-15 DIAGNOSIS — Z79899 Other long term (current) drug therapy: Secondary | ICD-10-CM

## 2019-09-15 DIAGNOSIS — I81 Portal vein thrombosis: Principal | ICD-10-CM

## 2019-09-15 DIAGNOSIS — K861 Other chronic pancreatitis: Secondary | ICD-10-CM | POA: Diagnosis present

## 2019-09-15 DIAGNOSIS — Z960 Presence of urogenital implants: Secondary | ICD-10-CM | POA: Diagnosis present

## 2019-09-15 DIAGNOSIS — I10 Essential (primary) hypertension: Secondary | ICD-10-CM | POA: Diagnosis present

## 2019-09-15 DIAGNOSIS — J439 Emphysema, unspecified: Secondary | ICD-10-CM | POA: Diagnosis present

## 2019-09-15 DIAGNOSIS — Z20822 Contact with and (suspected) exposure to covid-19: Secondary | ICD-10-CM | POA: Diagnosis present

## 2019-09-15 DIAGNOSIS — Z961 Presence of intraocular lens: Secondary | ICD-10-CM | POA: Diagnosis present

## 2019-09-15 DIAGNOSIS — F419 Anxiety disorder, unspecified: Secondary | ICD-10-CM | POA: Diagnosis present

## 2019-09-15 DIAGNOSIS — F329 Major depressive disorder, single episode, unspecified: Secondary | ICD-10-CM | POA: Diagnosis present

## 2019-09-15 DIAGNOSIS — Z720 Tobacco use: Secondary | ICD-10-CM | POA: Diagnosis present

## 2019-09-15 DIAGNOSIS — K859 Acute pancreatitis without necrosis or infection, unspecified: Secondary | ICD-10-CM

## 2019-09-15 DIAGNOSIS — Z9842 Cataract extraction status, left eye: Secondary | ICD-10-CM

## 2019-09-15 DIAGNOSIS — Z7951 Long term (current) use of inhaled steroids: Secondary | ICD-10-CM

## 2019-09-15 DIAGNOSIS — Z8249 Family history of ischemic heart disease and other diseases of the circulatory system: Secondary | ICD-10-CM

## 2019-09-15 DIAGNOSIS — F1120 Opioid dependence, uncomplicated: Secondary | ICD-10-CM | POA: Diagnosis present

## 2019-09-15 DIAGNOSIS — Z87442 Personal history of urinary calculi: Secondary | ICD-10-CM

## 2019-09-15 DIAGNOSIS — G8929 Other chronic pain: Secondary | ICD-10-CM | POA: Diagnosis present

## 2019-09-15 DIAGNOSIS — F17210 Nicotine dependence, cigarettes, uncomplicated: Secondary | ICD-10-CM | POA: Diagnosis present

## 2019-09-15 DIAGNOSIS — Z681 Body mass index (BMI) 19 or less, adult: Secondary | ICD-10-CM

## 2019-09-15 DIAGNOSIS — G35 Multiple sclerosis: Secondary | ICD-10-CM | POA: Diagnosis present

## 2019-09-15 DIAGNOSIS — R7303 Prediabetes: Secondary | ICD-10-CM | POA: Diagnosis present

## 2019-09-15 DIAGNOSIS — F410 Panic disorder [episodic paroxysmal anxiety] without agoraphobia: Secondary | ICD-10-CM | POA: Diagnosis present

## 2019-09-15 DIAGNOSIS — R1084 Generalized abdominal pain: Secondary | ICD-10-CM

## 2019-09-15 DIAGNOSIS — E43 Unspecified severe protein-calorie malnutrition: Secondary | ICD-10-CM | POA: Diagnosis present

## 2019-09-15 DIAGNOSIS — Z9841 Cataract extraction status, right eye: Secondary | ICD-10-CM

## 2019-09-15 DIAGNOSIS — R634 Abnormal weight loss: Secondary | ICD-10-CM

## 2019-09-15 DIAGNOSIS — M797 Fibromyalgia: Secondary | ICD-10-CM | POA: Diagnosis present

## 2019-09-15 DIAGNOSIS — Z823 Family history of stroke: Secondary | ICD-10-CM

## 2019-09-15 DIAGNOSIS — N400 Enlarged prostate without lower urinary tract symptoms: Secondary | ICD-10-CM | POA: Diagnosis present

## 2019-09-15 DIAGNOSIS — K219 Gastro-esophageal reflux disease without esophagitis: Secondary | ICD-10-CM | POA: Diagnosis present

## 2019-09-15 DIAGNOSIS — E876 Hypokalemia: Secondary | ICD-10-CM

## 2019-09-15 LAB — PHOSPHORUS: Phosphorus: 4.3 mg/dL (ref 2.5–4.6)

## 2019-09-15 LAB — CBC
HCT: 46.8 % (ref 39.0–52.0)
HCT: 41.9 % (ref 39.0–52.0)
Hemoglobin: 15.6 g/dL (ref 13.0–17.0)
Hemoglobin: 13.7 g/dL (ref 13.0–17.0)
MCH: 32.6 pg (ref 26.0–34.0)
MCH: 32.5 pg (ref 26.0–34.0)
MCHC: 33.3 g/dL (ref 30.0–36.0)
MCHC: 32.7 g/dL (ref 30.0–36.0)
MCV: 97.9 fL (ref 80.0–100.0)
MCV: 99.5 fL (ref 80.0–100.0)
Platelets: 300 10*3/uL (ref 150–400)
Platelets: 298 10*3/uL (ref 150–400)
RBC: 4.78 MIL/uL (ref 4.22–5.81)
RBC: 4.21 MIL/uL — ABNORMAL LOW (ref 4.22–5.81)
RDW: 14 % (ref 11.5–15.5)
RDW: 13.9 % (ref 11.5–15.5)
WBC: 11.8 10*3/uL — ABNORMAL HIGH (ref 4.0–10.5)
WBC: 11.6 10*3/uL — ABNORMAL HIGH (ref 4.0–10.5)
nRBC: 0 % (ref 0.0–0.2)
nRBC: 0 % (ref 0.0–0.2)

## 2019-09-15 LAB — PROTIME-INR
INR: 1.1 (ref 0.8–1.2)
Prothrombin Time: 13.3 seconds (ref 11.4–15.2)

## 2019-09-15 LAB — LIPASE, BLOOD: Lipase: 77 U/L — ABNORMAL HIGH (ref 11–51)

## 2019-09-15 LAB — COMPREHENSIVE METABOLIC PANEL
ALT: 16 U/L (ref 0–44)
AST: 17 U/L (ref 15–41)
Albumin: 3 g/dL — ABNORMAL LOW (ref 3.5–5.0)
Alkaline Phosphatase: 116 U/L (ref 38–126)
Anion gap: 8 (ref 5–15)
BUN: 9 mg/dL (ref 6–20)
CO2: 25 mmol/L (ref 22–32)
Calcium: 7.9 mg/dL — ABNORMAL LOW (ref 8.9–10.3)
Chloride: 102 mmol/L (ref 98–111)
Creatinine, Ser: 0.54 mg/dL — ABNORMAL LOW (ref 0.61–1.24)
GFR calc Af Amer: >60 mL/min (ref >60)
GFR calc non Af Amer: >60 mL/min (ref >60)
Glucose, Bld: 96 mg/dL (ref 70–99)
Potassium: 3.4 mmol/L — ABNORMAL LOW (ref 3.5–5.1)
Sodium: 135 mmol/L (ref 135–145)
Total Bilirubin: 0.6 mg/dL (ref 0.3–1.2)
Total Protein: 6.2 g/dL — ABNORMAL LOW (ref 6.5–8.1)

## 2019-09-15 LAB — LACTIC ACID, PLASMA
Lactic Acid, Venous: 0.9 mmol/L (ref 0.5–1.9)
Lactic Acid, Venous: 1 mmol/L (ref 0.5–1.9)

## 2019-09-15 LAB — MAGNESIUM: Magnesium: 2 mg/dL (ref 1.7–2.4)

## 2019-09-15 LAB — SARS CORONAVIRUS 2 BY RT PCR (HOSPITAL ORDER, PERFORMED IN ~~LOC~~ HOSPITAL LAB): SARS Coronavirus 2: NEGATIVE

## 2019-09-15 LAB — APTT: aPTT: 32 seconds (ref 24–36)

## 2019-09-15 MED ORDER — SODIUM CHLORIDE 0.9% FLUSH
3.0000 mL | Freq: Once | INTRAVENOUS | Status: AC
  Administered 2019-09-16: 3 mL via INTRAVENOUS

## 2019-09-15 MED ORDER — ACETAMINOPHEN 650 MG RE SUPP: 650.0000 mg | Freq: Four times a day (QID) | RECTAL | Status: DC | PRN

## 2019-09-15 MED ORDER — HYDROMORPHONE HCL 1 MG/ML IJ SOLN
1.0000 mg | Freq: Once | INTRAMUSCULAR | Status: AC
  Administered 2019-09-15: 1 mg via INTRAVENOUS
  Filled 2019-09-15: qty 1

## 2019-09-15 MED ORDER — POTASSIUM CHLORIDE CRYS ER 20 MEQ PO TBCR
40.0000 meq | EXTENDED_RELEASE_TABLET | Freq: Once | ORAL | Status: AC
  Administered 2019-09-15: 40 meq via ORAL
  Filled 2019-09-15: qty 2

## 2019-09-15 MED ORDER — GABAPENTIN 300 MG PO CAPS
600.0000 mg | ORAL_CAPSULE | Freq: Four times a day (QID) | ORAL | Status: DC
  Administered 2019-09-16 – 2019-09-17 (×8): 600 mg via ORAL
  Filled 2019-09-15 (×8): qty 2

## 2019-09-15 MED ORDER — POTASSIUM CHLORIDE IN NACL 20-0.9 MEQ/L-% IV SOLN: INTRAVENOUS | Status: DC

## 2019-09-15 MED ORDER — PANCRELIPASE (LIP-PROT-AMYL) 12000-38000 UNITS PO CPEP
12000.0000 [IU] | ORAL_CAPSULE | Freq: Three times a day (TID) | ORAL | Status: DC
  Filled 2019-09-15: qty 1

## 2019-09-15 MED ORDER — ALPRAZOLAM 1 MG PO TABS
1.0000 mg | ORAL_TABLET | Freq: Three times a day (TID) | ORAL | Status: DC | PRN
  Administered 2019-09-16 – 2019-09-17 (×4): 1 mg via ORAL
  Filled 2019-09-15 (×5): qty 1

## 2019-09-15 MED ORDER — ONDANSETRON HCL 4 MG/2ML IJ SOLN
4.0000 mg | Freq: Once | INTRAMUSCULAR | Status: AC
  Administered 2019-09-15: 4 mg via INTRAVENOUS
  Filled 2019-09-15: qty 2

## 2019-09-15 MED ORDER — SODIUM CHLORIDE 0.9 % IV BOLUS
1000.0000 mL | Freq: Once | INTRAVENOUS | Status: AC
  Administered 2019-09-15: 1000 mL via INTRAVENOUS

## 2019-09-15 MED ORDER — HYDROMORPHONE HCL 1 MG/ML IJ SOLN
1.0000 mg | INTRAMUSCULAR | Status: DC | PRN
  Administered 2019-09-16: 1 mg via INTRAVENOUS
  Filled 2019-09-15: qty 1

## 2019-09-15 MED ORDER — PROCHLORPERAZINE EDISYLATE 10 MG/2ML IJ SOLN
5.0000 mg | INTRAMUSCULAR | Status: DC | PRN
  Administered 2019-09-16 – 2019-09-17 (×4): 5 mg via INTRAVENOUS
  Filled 2019-09-15 (×4): qty 2

## 2019-09-15 MED ORDER — HEPARIN BOLUS VIA INFUSION
2300.0000 [IU] | Freq: Once | INTRAVENOUS | Status: AC
  Administered 2019-09-15: 2300 [IU] via INTRAVENOUS

## 2019-09-15 MED ORDER — HEPARIN (PORCINE) 25000 UT/250ML-% IV SOLN
1200.0000 [IU]/h | INTRAVENOUS | Status: DC
  Administered 2019-09-15: 750 [IU]/h via INTRAVENOUS
  Administered 2019-09-16: 1100 [IU]/h via INTRAVENOUS
  Filled 2019-09-15 (×2): qty 250

## 2019-09-15 MED ORDER — MOMETASONE FURO-FORMOTEROL FUM 100-5 MCG/ACT IN AERO
2.0000 | INHALATION_SPRAY | Freq: Two times a day (BID) | RESPIRATORY_TRACT | Status: DC
  Administered 2019-09-16 – 2019-09-17 (×3): 2 via RESPIRATORY_TRACT
  Filled 2019-09-15: qty 8.8

## 2019-09-15 MED ORDER — KETOROLAC TROMETHAMINE 0.5 % OP SOLN
2.0000 [drp] | Freq: Four times a day (QID) | OPHTHALMIC | Status: DC
  Administered 2019-09-16 – 2019-09-17 (×6): 2 [drp] via OPHTHALMIC
  Filled 2019-09-15: qty 3

## 2019-09-15 MED ORDER — ACETAMINOPHEN 325 MG PO TABS: 650.0000 mg | ORAL_TABLET | Freq: Four times a day (QID) | ORAL | Status: DC | PRN

## 2019-09-15 MED ORDER — PREDNISOLONE ACETATE 1 % OP SUSP
2.0000 [drp] | Freq: Two times a day (BID) | OPHTHALMIC | Status: DC
  Administered 2019-09-16 – 2019-09-17 (×3): 2 [drp] via OPHTHALMIC
  Filled 2019-09-15: qty 1

## 2019-09-15 MED ORDER — HYDROMORPHONE HCL 1 MG/ML IJ SOLN
1.0000 mg | Freq: Once | INTRAMUSCULAR | Status: AC
  Administered 2019-09-16: 1 mg via INTRAVENOUS
  Filled 2019-09-15: qty 1

## 2019-09-15 MED ORDER — PANTOPRAZOLE SODIUM 40 MG IV SOLR
40.0000 mg | Freq: Every day | INTRAVENOUS | Status: DC
  Administered 2019-09-16 (×2): 40 mg via INTRAVENOUS
  Filled 2019-09-15 (×2): qty 40

## 2019-09-15 NOTE — ED Triage Notes (Signed)
Pt to er, pt states that he is here for abd pain, states that he was seen yesterday, states that he had a ct and was dx with a blood clot in his liver, states that he is here today because the pain is worse and his doctor told him to come to the er.

## 2019-09-15 NOTE — Progress Notes (Addendum)
ANTICOAGULATION CONSULT NOTE - Initial Consult  Pharmacy Consult for IV Heparin Indication: Portal Vein Thrombosis  No Known Allergies  Patient Measurements: Height: 5\' 7"  (170.2 cm) Weight: 45.4 kg (100 lb) IBW/kg (Calculated) : 66.1 Heparin Dosing Weight: 45.4 kg (weight confirmed with RN)  Vital Signs: Temp: 98.7 F (37.1 C) (08/04 1924) Temp Source: Oral (08/04 1924) BP: 97/75 (08/04 1924) Pulse Rate: 107 (08/04 1924)  Labs: Recent Labs    09/15/19 2008  HGB 15.6  HCT 46.8  PLT 300  CREATININE 0.54*    Estimated Creatinine Clearance: 70.9 mL/min (A) (by C-G formula based on SCr of 0.54 mg/dL (L)).   Medical History: Past Medical History:  Diagnosis Date  . Alcohol use   . Anxiety    Disabled due to panic attacks  . Arthritis   . Bilateral ureteral calculi   . Borderline type 2 diabetes mellitus   . BPH (benign prostatic hypertrophy)   . Chronic back pain   . Condylomata acuminata in male    multiple procedures --  penile, peri-rectal , perineum  . DDD (degenerative disc disease), cervical   . Depression   . Dyspnea on exertion   . Emphysematous COPD (HCC)   . Epiretinal membrane (ERM) of both eyes   . Fibromyalgia   . GERD (gastroesophageal reflux disease)   . Headache(784.0)   . History of chronic pancreatitis    severeal adx for this /  2008  dx alcoholic pancreatitis  . History of esophageal dilatation   . History of hiatal hernia   . History of kidney stones   . History of panic attacks   . History of sepsis    adx 05-01-2014--  urosepsis due to kidney stones obstruction/ hydronephrosis  . History of suicidal ideation    2006   adx  . Hypertension    was on medication for short time, htn was caused by prednisone per pt  . Multiple sclerosis (HCC)    pt. states has 4 small brain lesions  . Nephrolithiasis    bilateral   . Retinal vasculitis   . Uveitis     Assessment: 49 yr old with hx of chronic pancreatitis, abdominal pain presented  to [facility] ED as instructed by [facility] group for management of portal vein thrombosis noted on abdominal CT done yesterday. Pt had been a heavy drinker, but has not been drinking in over 2 yrs. Pt was on no anticoagulants PTA.  H/H, platelets WNL, Scr 0.54, CrCl 70.9 ml/min  Goal of Therapy:  Heparin level 0.3-0.7 units/ml Monitor platelets by anticoagulation protocol: Yes   Plan:  Heparin 2300 units IV bolus X 1, followed by heparin infusion at 750 units/hr Check 6-hr heparin level Monitor daily heparin level, CBC Monitor for signs/symptoms of bleeding  Diane Berthe Oley,PharmD, BCPS, FCCP Clinical Pharmacist 09/15/2019,9:52 PM

## 2019-09-15 NOTE — H&P (Signed)
History and Physical    Brent [name] WGN:[id number] DOB: May 21, 1970 DOA: 09/15/2019  PCP: Gareth Morgan, MD   Patient coming from: Home.   I have personally briefly reviewed patient's old medical records in [facility]  Chief Complaint: Abdominal pain.  HPI: Brent [name] is a 49 y.o. male with medical history significant of alcohol abuse, anxiety, depression, panic attacks, chronic pancreatitis, borderline type II DM, BPH, chronic back pain/DDD, history of penile condyloma, COPD/emphysema, epiretinal membrane of both eyes, uveitis, retinal vasculitis, fibromyalgia, GERD, history of headaches, history of hiatal hernia, history of esophageal dilatation, hypertension, multiple sclerosis, bilateral nephrolithiasis is coming to the emergency department complaints of progressively worsening of chronic abdominal pain for the past few days, but particularly intense yesterday and today.  He has had some episodes of nausea and emesis.  He has states that it is not unusual for him to occasionally vomit.  He denies constipation, melena or hematochezia.  He has frequent loose stools.  He denies dysuria, frequency or hematuria.  He has not been able to eat much in the past few days.  Denies fever, chills, night sweats, rhinorrhea, sore throat, dyspnea, wheezing or hemoptysis.  No chest pain, palpitations, diaphoresis, PND, orthopnea or pitting edema of the lower extremities.  He denies polyuria, polydipsia, polyphagia or blurred vision.  ED Course: Initial vital signs were temperature 98.7 F, pulse 107, respiration 18, blood pressure 97/75 mmHg and O2 sat 94% on room air. The patient received a 1000 mL NS bolus, hydromorphone 1 mg IVP x1 and was started on a heparin infusion.  CBC showed a white count 11.8, hemoglobin 15.6 g/dL and platelets 784.  Lipase was 77.CMP showed a potassium 3.4 mmol/L, corrected calcium of 8.7 mg/dL.  Total protein was 6.2 and albumin 3.0 g/dL. The rest of the electrolytes  and hepatic functions are within normal range.  Renal function was normal.  Imaging: CT abdomen/pelvis with contrast done yesterday at [facility] show extensive portal vein thrombosis. Please see the full report below on radiological exams on admission tap.  Review of Systems: As per HPI otherwise all other systems reviewed and are negative.  Past Medical History:  Diagnosis Date  . Alcohol use   . Anxiety    Disabled due to panic attacks  . Arthritis   . Bilateral ureteral calculi   . Borderline type 2 diabetes mellitus   . BPH (benign prostatic hypertrophy)   . Chronic back pain   . Condylomata acuminata in male    multiple procedures --  penile, peri-rectal , perineum  . DDD (degenerative disc disease), cervical   . Depression   . Dyspnea on exertion   . Emphysematous COPD (HCC)   . Epiretinal membrane (ERM) of both eyes   . Fibromyalgia   . GERD (gastroesophageal reflux disease)   . Headache(784.0)   . History of chronic pancreatitis    severeal adx for this /  2008  dx alcoholic pancreatitis  . History of esophageal dilatation   . History of hiatal hernia   . History of kidney stones   . History of panic attacks   . History of sepsis    adx 05-01-2014--  urosepsis due to kidney stones obstruction/ hydronephrosis  . History of suicidal ideation    2006   adx  . Hypertension    was on medication for short time, htn was caused by prednisone per pt  . Multiple sclerosis (HCC)    pt. states has 4 small brain  lesions  . Nephrolithiasis    bilateral   . Retinal vasculitis   . Uveitis    Past Surgical History:  Procedure Laterality Date  . BIOPSY  11/12/2012   Procedure: GASTRIC AND ESOPHAGEAL BIOPSIES;  Surgeon: Corbin Ade, MD;  Location: [facility];  Service: Endoscopy;;  . CATARACT EXTRACTION W/ INTRAOCULAR LENS  IMPLANT, BILATERAL    . COLONOSCOPY  10/08/2011   Jenkins:Normal colon/Anal condyloma without extension proximal to dentate line  . CYSTOSCOPY W/ URETERAL  STENT PLACEMENT Bilateral 05/01/2014   Procedure: CYSTOSCOPY WITH BILATERAL RETROGRADE PYELOGRAM; BILATERAL URETERAL STENT PLACEMENT;  Surgeon: Barron Alvine, MD;  Location: [facility];  Service: Urology;  Laterality: Bilateral;  . CYSTOSCOPY W/ URETERAL STENT REMOVAL Bilateral 06/06/2014   Procedure: CYSTOSCOPY WITH STENT REMOVAL;  Surgeon: Marcine Matar, MD;  Location[facility];  Service: Urology;  Laterality: Bilateral;  . CYSTOSCOPY WITH URETEROSCOPY  06/06/2014   Procedure: CYSTOSCOPY WITH URETEROSCOPY;  Surgeon: Marcine Matar, MD;  Location[facility];  Service: Urology;;  . Bluford Kaufmann WITH URETEROSCOPY AND STENT PLACEMENT Bilateral 06/06/2014   Procedure: CYSTOSCOPY WITH J2 STENT EXTRACTION,,URETEROSCOPY WITH EXTRACTION OF STONES,;  Surgeon: Marcine Matar, MD;  Location: [facility];  Service: Urology;  Laterality: Bilateral;  . ELECTROCAUTERY/ DESICCATION OF CONDYLOMA LESIONS  01-15-2008  &  12-29-2009   PENIS, PERI-RECTAL , PERINUEM  . ESOPHAGOGASTRODUODENOSCOPY (EGD) WITH PROPOFOL N/A 11/12/2012   PIR:JJOACZ esophagus s/p  passage of a Maloney dilator and biopsy. Abnormal gastric mucosa-status post biopsy  . FLEXIBLE BRONCHOSCOPY N/A 08/12/2012   Procedure: FLEXIBLE BRONCHOSCOPY;  Surgeon: Fredirick Maudlin, MD;  Location: [facility];  Service: Pulmonary;  Laterality: N/A;  . MALONEY DILATION N/A 11/12/2012   Procedure: MALONEY DILATION (60mm);  Surgeon: Corbin Ade, MD;  Location: [facility];  Service: Endoscopy;  Laterality: N/A;  . MULTIPLE EXTRACTIONS WITH ALVEOLOPLASTY N/A 04/22/2014   Procedure: MULTIPLE EXTRACTIONS ( 2,3,5,6,7,8,9,10,11,13,14,15,21,28  WITH ALVEOLOPLASTY;  Surgeon: Ocie Doyne, DDS;  Location: [facility];  Service: Oral Surgery;  Laterality: N/A;  . WISDOM TOOTH EXTRACTION     Social History  reports that he has been smoking cigarettes and cigars. He has a 20.00 pack-year smoking history. He has never used smokeless tobacco.  He reports previous alcohol use. He reports that he does not use drugs.  No Known Allergies  Family History  Problem Relation Age of Onset  . Hypertension Mother   . Breast cancer Mother   . Melanoma Mother   . Stroke Mother   . Lung cancer Father 20  . Colon cancer Neg Hx   . Colitis Neg Hx   . Cirrhosis Neg Hx   . Liver disease Neg Hx   . Pancreatic cancer Neg Hx   . Pancreatitis Neg Hx   . Anesthesia problems Neg Hx   . Hypotension Neg Hx   . Malignant hyperthermia Neg Hx   . Pseudochol deficiency Neg Hx    Prior to Admission medications   Medication Sig Start Date End Date Taking? Authorizing Provider  albuterol (PROVENTIL HFA;VENTOLIN HFA) 108 (90 Base) MCG/ACT inhaler Inhale 1-2 puffs into the lungs every 6 (six) hours as needed for wheezing or shortness of breath.  07/25/15  Yes [provider]  ALPRAZolam Prudy Feeler) 1 MG tablet Take 1 mg by mouth 4 (four) times daily.   Yes [provider]  Calcium Carbonate-Vitamin D (CALCIUM HIGH POTENCY/VITAMIN D) 600-200 MG-UNIT TABS Take 1 tablet by mouth daily.   Yes [provider]  CREON 3000-9500 units CPEP Take 1 capsule by mouth 3 (three) times daily. 05/17/19  Yes [provider]  gabapentin (NEURONTIN) 600 MG tablet Take 600 mg by mouth 4 (four) times daily. 05/21/19  Yes [provider]  HYDROcodone-acetaminophen (NORCO) 10-325 MG tablet Take 1 tablet by mouth 2 (two) times daily as needed. For pain   Yes [provider]  ketorolac (ACULAR) 0.5 % ophthalmic solution Place 2 drops into both eyes 4 (four) times daily.   Yes [provider]  natalizumab (TYSABRI) 300 MG/15ML injection Inject 300 mg into the vein every 30 (thirty) days.    Yes [provider]  omeprazole (PRILOSEC) 40 MG capsule Take 40 mg by mouth at bedtime.   Yes [provider]  ondansetron (ZOFRAN) 4 MG tablet Take 4 mg by mouth every 8 (eight) hours as needed for nausea.  05/28/19  Yes  [provider]  prednisoLONE acetate (PRED FORTE) 1 % ophthalmic suspension Place 2 drops into both eyes 2 (two) times daily.  01/24/15  Yes [provider]  SYMBICORT 80-4.5 MCG/ACT inhaler Inhale 1 puff into the lungs 2 (two) times daily. 04/23/19  Yes [provider]  zolpidem (AMBIEN) 10 MG tablet Take 10 mg by mouth at bedtime as needed.   Yes [provider]    Physical Exam: Vitals:   09/15/19 1924 09/15/19 1925 09/15/19 2230 09/15/19 2300  BP: 97/75   (!) 108/94  Pulse: (!) 107  84 84  Resp: 18  12 14   Temp: 98.7 F (37.1 C)     TempSrc: Oral     SpO2: 94%  97% 96%  Weight:  45.4 kg    Height:  5\' 7"  (1.702 m)      Constitutional: Looks chronically ill. Eyes: PERRL, lids and conjunctivae are injected. ENMT: Mucous membranes are dry. Posterior pharynx clear of any exudate or lesions.  Neck: normal, supple, no masses, no thyromegaly Respiratory: Decreased breath sounds on bases, otherwise clear to auscultation bilaterally, no wheezing, no crackles. Normal respiratory effort. No accessory muscle use.  Cardiovascular: Tachycardic at 109 bpm, no murmurs / rubs / gallops. No extremity edema. 2+ pedal pulses. No carotid bruits.  Abdomen: Nondistended.  BS positive.  Soft, positive RUQ tenderness, no guarding or rebound, no masses palpated. No hepatosplenomegaly. Musculoskeletal: no clubbing / cyanosis. Good ROM, no contractures. Normal muscle tone.  Skin: no rashes, lesions, ulcers on limited dermatological examination. Neurologic: CN 2-12 grossly intact. Sensation intact, DTR normal. Strength 5/5 in all 4.  Psychiatric: Normal judgment and insight. Alert and oriented x 3. Normal mood.    Labs on Admission: I have personally reviewed following labs and imaging studies  CBC: Recent Labs  Lab 09/15/19 2008 09/15/19 2219  WBC 11.8* 11.6*  HGB 15.6 13.7  HCT 46.8 41.9  MCV 97.9 99.5  PLT 300 298    Basic Metabolic Panel: Recent Labs    Lab 09/15/19 2008  NA 135  K 3.4*  CL 102  CO2 25  GLUCOSE 96  BUN 9  CREATININE 0.54*  CALCIUM 7.9*   GFR: Estimated Creatinine Clearance: 70.9 mL/min (A) (by C-G formula based on SCr of 0.54 mg/dL (L)).  Liver Function Tests: Recent Labs  Lab 09/15/19 2008  AST 17  ALT 16  ALKPHOS 116  BILITOT 0.6  PROT 6.2*  ALBUMIN 3.0*   Radiological Exams on Admission: External source imaging report:  CT ABDOMEN PELVIS WWO CONTRAST, 09/14/2019 3:15 PM   INDICATION:Abdominal pain, weight loss \  Doppler 06/09/19 suggests IMV thrombus, still with postprandial pain. please evaluate for ongoing vascular problem \ R93.3 Abnormal finding on GI tract imaging \ R10.12 Postprandial abdominal pain in left upper quadrant  COMPARISON: CT 05/13/2019   TECHNIQUE: Multislice axial images were obtained through the abdomen and pelvis with and without administration of iodinated intravenous contrast material. Multi-planar reformatted images were generated for additional analysis. Nongated technique limits cardiac detail.   All CT scans at [facility] and [facility] are performed using dose optimization techniques as appropriate to a performed exam, including but not limited to one or more of the following: automated exposure control, adjustment of the mA and/or kV according to patient size, use of iterative reconstruction technique. In addition, [facility] is participating in the ACR Dose Registry program which will further assist [place] in optimizing patient radiation exposure.   FINDINGS:   LOWER CHEST:  . Heart/vessel: Normal heart size. No pericardial effusion.  . Lungs: Partially imaged centrilobular position is changes. Atelectasis and/or scarring in the right lower lobe.  . Pleura: Within normal limits.   ABDOMEN:  . Liver: Normal size and contour. Geographic areas of altered perfusion secondary to portal vein thrombosis as further described below.  .  Gallbladder/biliary: Within normal limits.  . Spleen: Within normal limits.  . Pancreas: Relative to prior CT, pancreas appears mildly edematous with peripancreatic stranding.  . Adrenals: Within normal limits.  . Kidneys: Symmetric enhancement. No hydronephrosis. Subcentimeter hypodensities throughout both kidneys are too small for accurate characterization but statistically benign.  . Peritoneum: No pneumoperitoneum or loculated ascites. Small volume of free fluid in the lower pelvis.  . Mesentery: Mild mesenteric edema.  . Extraperitoneum: Within normal limits.  . GI tract: Surgical changes along the greater curvature of the stomach. No findings to suggest obstruction. Mild mural thickening involving the sigmoid and descending colon. No portal venous gas or pneumatosis. Moderate colonic stool burden. Normal caliber appendix.  . Vascular: Extensive portal thrombosis extending from the bifurcation of the main portal vein throughout the anterior and posterior divisions of the right portal vein and into a short segment of the left portal vein. There is thrombus within the splenic vein near the splenic hilum. No definite thrombus identified within the SMV or IMV. Scattered atherosclerotic changes of the aorta. No occlusion or high-grade stenosis of the arterial structures.   PELVIS:  . Ureters: Within normal limits.  . Bladder: Largely decompressed.  . Reproductive system: Prostamegaly.   MSK:  . Remote compression deformity of L2. Dextrocurvature of the thoracolumbar spine with associated degenerative changes most focal at L4-L5. No acute or aggressive osseous findings.   CONCLUSION:   1. Extensive portal vein thrombosis originating at the level of the main portal vein bifurcation and predominantly involving the right portal system. There is a short segment involvement of the left portal vein. Thrombus is also present involving the splenic vein at the level of the splenic hilum.    2. Edematous appearance of the pancreas with peripancreatic stranding may reflect sequela of acute on chronic pancreatitis and can be correlated with serum lipase.  3. Mild thickening of the sigmoid and descending colon could be in part related to elevated portal pressures, though a nonspecific colitis is not excluded. No portal venous gas or pneumatosis.   These findings were discussed with JESSICA Antionette Char, PA by Dr. Marlan Palau via telephone @ 09/14/2019 4:05 PM  Specimen Collected: 09/14/19 3:29 PM Last Resulted: 09/14/19 4:12 PM  Received  From: [facility]  Result Received: 09/15/19 7:14 PM   EKG: Independently reviewed.   Assessment/Plan Principal Problem:   Portal vein thrombosis Observation/telemetry. Keep n.p.o. Continue heparin infusion. Analgesics as needed. Antiemetics as needed.  Advised he will need chronic AC therapy.  Active Problems:   Hypokalemia Replacing. Follow-up potassium level.    Acute on chronic pancreatitis (HCC) Keep n.p.o. for now. Continue IVF. Analgesics as needed. Antiemetics as needed.    Protein-calorie malnutrition (HCC) Secondary to chronic pancreatitis. Protein supplementation once able to tolerate oral intake.    Tobacco abuse Declined nicotine replacement therapy. Staff to provide tobacco cessation information.     DVT prophylaxis: Heparin SQ. Code Status:   Full code. Family Communication: Disposition Plan:   Patient is from:  Home.  Anticipated DC to:  Home.  Anticipated DC date:  09/16/2019.  Anticipated DC barriers: Clinical improvement.  Consults called: Admission status:  Observation/telemetry.   Severity of Illness: High severity due to extensive compromise of the portal circulation with significant symptomatology.  Bobette Mo MD [facility]  How to contact the [facility] Attending or Consulting provider 7A - 7P or covering provider during after hours 7P -7A, for this  patient?   1. Check the care team in [facility] and look for a) attending/consulting TRH provider listed and b) the [facility] team listed 2. Log into [email or web address] and use [place]'s universal password to access. If you do not have the password, please contact the hospital operator. 3. Locate the [facility] provider you are looking for under [facility] and page to a number that you can be directly reached. 4. If you still have difficulty reaching the provider, please page the [facility] (Director on Call) for the Hospitalists listed on amion for assistance.  09/15/2019, 11:18 PM   This document was prepared using Dragon voice recognition software and may contain some unintended transcription errors.

## 2019-09-15 NOTE — ED Provider Notes (Signed)
[facility] EMERGENCY DEPARTMENT Provider Note   CSN: [id number] Arrival date & time: 09/15/19  1913   History Chief Complaint  Patient presents with  . Abdominal Pain    Brent [name] is a 49 y.o. male.  The history is provided by the patient.  Abdominal Pain He has history of hypertension, borderline diabetes, chronic pancreatitis and comes in because of abdominal pain.  Abdominal pain is generalized and has been intermittent over the last 2 years.  Pain today was severe and he rated it at 8/10.  Pain does radiate to the back.  Is worse when he eats, nothing makes it better.  He does take hydrocodone-acetaminophen which does give slight, temporary relief.  However, recently, hydrocodone has been giving him the degree of relief and had been giving him previously.  He denies fever, chills, sweats.  He does occasionally have nausea and vomiting.  He had been a heavy drinker, but has not been drinking in over 2 years.  He does endorse a 25 pound involuntary weight loss.  He had a CT scan at [facility] yesterday and it is reported to have shown a blood clot.  Past Medical History:  Diagnosis Date  . Alcohol use   . Anxiety    Disabled due to panic attacks  . Arthritis   . Bilateral ureteral calculi   . Borderline type 2 diabetes mellitus   . BPH (benign prostatic hypertrophy)   . Chronic back pain   . Condylomata acuminata in male    multiple procedures --  penile, peri-rectal , perineum  . DDD (degenerative disc disease), cervical   . Depression   . Dyspnea on exertion   . Emphysematous COPD (HCC)   . Epiretinal membrane (ERM) of both eyes   . Fibromyalgia   . GERD (gastroesophageal reflux disease)   . Headache(784.0)   . History of chronic pancreatitis    severeal adx for this /  2008  dx alcoholic pancreatitis  . History of esophageal dilatation   . History of hiatal hernia   . History of kidney stones   . History of panic attacks   . History of  sepsis    adx 05-01-2014--  urosepsis due to kidney stones obstruction/ hydronephrosis  . History of suicidal ideation    2006   adx  . Hypertension    was on medication for short time, htn was caused by prednisone per pt  . Multiple sclerosis (HCC)    pt. states has 4 small brain lesions  . Nephrolithiasis    bilateral   . Retinal vasculitis   . Uveitis     Patient Active Problem List   Diagnosis Date Noted  . Question of Mesenteric thrombosis 06/09/2019  . Abnormal CT of the abdomen   . Pancreatitis 02/24/2018  . Protein-calorie malnutrition, severe 02/24/2018  . MS (multiple sclerosis) (HCC) 11/28/2017  . Acute pancreatitis 11/27/2017  . Loose stools 11/27/2017  . Acute on chronic pancreatitis (HCC) 11/27/2017  . Pancreatitis, alcoholic, acute 02/18/2016  . Acute alcoholic pancreatitis 11/07/2014  . Bilateral ureteral calculi   . UTI (lower urinary tract infection)   . Ureteral calculi 05/02/2014  . Ureterolithiasis 05/01/2014  . UTI (urinary tract infection) 05/01/2014  . Sepsis (HCC) 05/01/2014  . Hydronephrosis 05/01/2014  . Tobacco abuse 05/01/2014  . Failure to thrive (0-17) 01/22/2014  . Melena 11/03/2012  . Odynophagia 11/03/2012  . Esophageal dysphagia 11/03/2012  . ETOH abuse 08/31/2011  . Chronic back pain   .  Anxiety   . Weight loss, abnormal 05/04/2010  . Epigastric pain 05/04/2010  . Rectal bleeding 05/04/2010  . Rectal pain 05/04/2010    Past Surgical History:  Procedure Laterality Date  . BIOPSY  11/12/2012   Procedure: GASTRIC AND ESOPHAGEAL BIOPSIES;  Surgeon: Corbin Ade, MD;  Location: [facility];  Service: Endoscopy;;  . CATARACT EXTRACTION W/ INTRAOCULAR LENS  IMPLANT, BILATERAL    . COLONOSCOPY  10/08/2011   Jenkins:Normal colon/Anal condyloma without extension proximal to dentate line  . CYSTOSCOPY W/ URETERAL STENT PLACEMENT Bilateral 05/01/2014   Procedure: CYSTOSCOPY WITH BILATERAL RETROGRADE PYELOGRAM; BILATERAL URETERAL STENT  PLACEMENT;  Surgeon: Barron Alvine, MD;  Location: [facility];  Service: Urology;  Laterality: Bilateral;  . CYSTOSCOPY W/ URETERAL STENT REMOVAL Bilateral 06/06/2014   Procedure: CYSTOSCOPY WITH STENT REMOVAL;  Surgeon: Marcine Matar, MD;  Location: [facility];  Service: Urology;  Laterality: Bilateral;  . CYSTOSCOPY WITH URETEROSCOPY  06/06/2014   Procedure: CYSTOSCOPY WITH URETEROSCOPY;  Surgeon: Marcine Matar, MD;  Location: [facility];  Service: Urology;;  . Bluford Kaufmann WITH URETEROSCOPY AND STENT PLACEMENT Bilateral 06/06/2014   Procedure: CYSTOSCOPY WITH J2 STENT EXTRACTION,,URETEROSCOPY WITH EXTRACTION OF STONES,;  Surgeon: Marcine Matar, MD;  Location: [facility];  Service: Urology;  Laterality: Bilateral;  . ELECTROCAUTERY/ DESICCATION OF CONDYLOMA LESIONS  01-15-2008  &  12-29-2009   PENIS, PERI-RECTAL , PERINUEM  . ESOPHAGOGASTRODUODENOSCOPY (EGD) WITH PROPOFOL N/A 11/12/2012   HYI:FOYDXA esophagus s/p  passage of a Maloney dilator and biopsy. Abnormal gastric mucosa-status post biopsy  . FLEXIBLE BRONCHOSCOPY N/A 08/12/2012   Procedure: FLEXIBLE BRONCHOSCOPY;  Surgeon: Fredirick Maudlin, MD;  Location: [facility];  Service: Pulmonary;  Laterality: N/A;  . MALONEY DILATION N/A 11/12/2012   Procedure: MALONEY DILATION (15mm);  Surgeon: Corbin Ade, MD;  Location: [facility];  Service: Endoscopy;  Laterality: N/A;  . MULTIPLE EXTRACTIONS WITH ALVEOLOPLASTY N/A 04/22/2014   Procedure: MULTIPLE EXTRACTIONS ( 2,3,5,6,7,8,9,10,11,13,14,15,21,28  WITH ALVEOLOPLASTY;  Surgeon: Ocie Doyne, DDS;  Location: [facility];  Service: Oral Surgery;  Laterality: N/A;  . WISDOM TOOTH EXTRACTION         Family History  Problem Relation Age of Onset  . Hypertension Mother   . Breast cancer Mother   . Melanoma Mother   . Stroke Mother   . Lung cancer Father 69  . Colon cancer Neg Hx   . Colitis Neg Hx   . Cirrhosis Neg Hx   . Liver disease Neg Hx   .  Pancreatic cancer Neg Hx   . Pancreatitis Neg Hx   . Anesthesia problems Neg Hx   . Hypotension Neg Hx   . Malignant hyperthermia Neg Hx   . Pseudochol deficiency Neg Hx     Social History   Tobacco Use  . Smoking status: Current Every Day Smoker    Packs/day: 1.00    Years: 20.00    Pack years: 20.00    Types: Cigarettes, Cigars  . Smokeless tobacco: Never Used  . Tobacco comment: 1 pack per week now as of 02/24/2018   Vaping Use  . Vaping Use: Never used  Substance Use Topics  . Alcohol use: Not Currently  . Drug use: No    Home Medications Prior to Admission medications   Medication Sig Start Date End Date Taking? Authorizing Provider  albuterol (PROVENTIL HFA;VENTOLIN HFA) 108 (90 Base) MCG/ACT inhaler Inhale 1-2 puffs into the lungs every 6 (six) hours as needed for wheezing or shortness of  breath.  07/25/15   [provider]  ALPRAZolam Prudy Feeler) 1 MG tablet Take 1 mg by mouth 4 (four) times daily.    [provider]  Calcium Carbonate-Vitamin D (CALCIUM HIGH POTENCY/VITAMIN D) 600-200 MG-UNIT TABS Take 1 tablet by mouth daily.    [provider]  CREON 3000-9500 units CPEP Take 1 capsule by mouth 3 (three) times daily. 05/17/19   [provider]  gabapentin (NEURONTIN) 600 MG tablet Take 600 mg by mouth 4 (four) times daily. 05/21/19   [provider]  HYDROcodone-acetaminophen (NORCO) 10-325 MG tablet Take 1 tablet by mouth 2 (two) times daily as needed. For pain    [provider]  ketorolac (ACULAR) 0.5 % ophthalmic solution Place 2 drops into both eyes 4 (four) times daily.    [provider]  natalizumab (TYSABRI) 300 MG/15ML injection Inject 300 mg into the vein every 30 (thirty) days.     [provider]  omeprazole (PRILOSEC) 40 MG capsule Take 40 mg by mouth at bedtime.    [provider]  ondansetron (ZOFRAN) 4 MG tablet SMARTSIG:1 Tablet(s) By Mouth 1 to 3 Times Daily 05/28/19   [provider]  prednisoLONE acetate (PRED FORTE) 1 % ophthalmic suspension Place 2 drops into both eyes 2 (two) times daily.  01/24/15   [provider]  SYMBICORT 80-4.5 MCG/ACT inhaler Inhale 1 puff into the lungs 2 (two) times daily. 04/23/19   [provider]  zolpidem (AMBIEN) 10 MG tablet Take 10 mg by mouth at bedtime as needed.    [provider]    Allergies    Patient has no known allergies.  Review of Systems   Review of Systems  Gastrointestinal: Positive for abdominal pain.  All other systems reviewed and are negative.   Physical Exam Updated Vital Signs BP 97/75 (BP Location: Right Arm)   Pulse (!) 107   Temp 98.7 F (37.1 C) (Oral)   Resp 18   Ht 5\' 7"  (1.702 m)   Wt 45.4 kg   SpO2 94%   BMI 15.66 kg/m   Physical Exam Vitals and nursing note reviewed.   Cachectic 49 year old male, resting comfortably and in no acute distress. Vital signs are significant for slightly elevated heart rate. Oxygen saturation is 94%, which is normal. Head is normocephalic and atraumatic. PERRLA, EOMI. Oropharynx is clear. Neck is nontender and supple without adenopathy or JVD. Back is nontender and there is no CVA tenderness. Lungs are clear without rales, wheezes, or rhonchi. Chest is nontender. Heart has regular rate and rhythm without murmur. Abdomen is soft, flat, with tenderness in the epigastrium, right upper quadrant, suprapubic areas.  There is no rebound or guarding.  There are no masses or hepatosplenomegaly and peristalsis is slightly hypoactive. Extremities have no cyanosis or edema, full range of motion is present. Skin is warm and dry without rash. Neurologic: Mental status is normal, cranial nerves are intact, there are no motor or sensory deficits.  ED Results / Procedures / Treatments   Labs (all labs ordered are listed, but only abnormal results are displayed) Labs Reviewed  LIPASE, BLOOD - Abnormal; Notable for the following  components:      Result Value   Lipase 77 (*)    All other components within normal limits  COMPREHENSIVE METABOLIC PANEL - Abnormal; Notable for the following components:   Potassium 3.4 (*)    Creatinine, Ser 0.54 (*)    Calcium 7.9 (*)  Total Protein 6.2 (*)    Albumin 3.0 (*)    All other components within normal limits  CBC - Abnormal; Notable for the following components:   WBC 11.8 (*)    All other components within normal limits  SARS CORONAVIRUS 2 BY RT PCR (HOSPITAL ORDER, PERFORMED IN [facility])  URINALYSIS, ROUTINE W REFLEX MICROSCOPIC  LACTIC ACID, PLASMA  LACTIC ACID, PLASMA  PROTIME-INR  APTT   Procedures Procedures   Medications Ordered in ED Medications  sodium chloride flush (NS) 0.9 % injection 3 mL (has no administration in time range)  potassium chloride SA (KLOR-CON) CR tablet 40 mEq (has no administration in time range)  ondansetron (ZOFRAN) injection 4 mg (has no administration in time range)  HYDROmorphone (DILAUDID) injection 1 mg (has no administration in time range)    ED Course  I have reviewed the triage vital signs and the nursing notes.  Pertinent labs & imaging results that were available during my care of the patient were reviewed by me and considered in my medical decision making (see chart for details).  MDM Rules/Calculators/A&P Exacerbation of chronic abdominal pain.  Old records are reviewed, and CT of abdomen and pelvis done at [facility] yesterday showed extensive portal vein thrombosis originating at the level of the main portal vein bifurcation predominantly involving the right portal system and also involving a short segment of the left portal vein.  Thrombus also involves the splenic vein.  There were also changes of acute on chronic pancreatitis.  Lipase here is mildly elevated at 77.  In the setting of chronic pancreatitis, that may actually be a significant elevation of lipase.  He will be  given IV fluids, hydromorphone, ondansetron and will be started on heparin.  Case is discussed with Dr. Robb Matar of [facility], who agrees to admit the patient.  Final Clinical Impression(s) / ED Diagnoses Final diagnoses:  Generalized abdominal pain  Acute on chronic pancreatitis (HCC)  Portal vein thrombosis  Hypokalemia    Rx / DC Orders ED Discharge Orders    None       Dione Booze, MD 09/15/19 2207

## 2019-09-16 DIAGNOSIS — F17210 Nicotine dependence, cigarettes, uncomplicated: Secondary | ICD-10-CM | POA: Diagnosis present

## 2019-09-16 DIAGNOSIS — M797 Fibromyalgia: Secondary | ICD-10-CM | POA: Diagnosis present

## 2019-09-16 DIAGNOSIS — F329 Major depressive disorder, single episode, unspecified: Secondary | ICD-10-CM | POA: Diagnosis present

## 2019-09-16 DIAGNOSIS — E43 Unspecified severe protein-calorie malnutrition: Secondary | ICD-10-CM | POA: Diagnosis present

## 2019-09-16 DIAGNOSIS — K861 Other chronic pancreatitis: Secondary | ICD-10-CM | POA: Diagnosis present

## 2019-09-16 DIAGNOSIS — Z20822 Contact with and (suspected) exposure to covid-19: Secondary | ICD-10-CM | POA: Diagnosis present

## 2019-09-16 DIAGNOSIS — R1084 Generalized abdominal pain: Secondary | ICD-10-CM | POA: Diagnosis not present

## 2019-09-16 DIAGNOSIS — F419 Anxiety disorder, unspecified: Secondary | ICD-10-CM | POA: Diagnosis present

## 2019-09-16 DIAGNOSIS — R7303 Prediabetes: Secondary | ICD-10-CM | POA: Diagnosis present

## 2019-09-16 DIAGNOSIS — I81 Portal vein thrombosis: Principal | ICD-10-CM

## 2019-09-16 DIAGNOSIS — N400 Enlarged prostate without lower urinary tract symptoms: Secondary | ICD-10-CM | POA: Diagnosis present

## 2019-09-16 DIAGNOSIS — F410 Panic disorder [episodic paroxysmal anxiety] without agoraphobia: Secondary | ICD-10-CM | POA: Diagnosis present

## 2019-09-16 DIAGNOSIS — Z87442 Personal history of urinary calculi: Secondary | ICD-10-CM | POA: Diagnosis not present

## 2019-09-16 DIAGNOSIS — Z79899 Other long term (current) drug therapy: Secondary | ICD-10-CM | POA: Diagnosis not present

## 2019-09-16 DIAGNOSIS — G8929 Other chronic pain: Secondary | ICD-10-CM | POA: Diagnosis present

## 2019-09-16 DIAGNOSIS — R634 Abnormal weight loss: Secondary | ICD-10-CM | POA: Diagnosis not present

## 2019-09-16 DIAGNOSIS — K219 Gastro-esophageal reflux disease without esophagitis: Secondary | ICD-10-CM | POA: Diagnosis present

## 2019-09-16 DIAGNOSIS — G35 Multiple sclerosis: Secondary | ICD-10-CM | POA: Diagnosis present

## 2019-09-16 DIAGNOSIS — Z681 Body mass index (BMI) 19 or less, adult: Secondary | ICD-10-CM | POA: Diagnosis not present

## 2019-09-16 DIAGNOSIS — Z8249 Family history of ischemic heart disease and other diseases of the circulatory system: Secondary | ICD-10-CM | POA: Diagnosis not present

## 2019-09-16 DIAGNOSIS — Z7951 Long term (current) use of inhaled steroids: Secondary | ICD-10-CM | POA: Diagnosis not present

## 2019-09-16 DIAGNOSIS — J439 Emphysema, unspecified: Secondary | ICD-10-CM | POA: Diagnosis present

## 2019-09-16 DIAGNOSIS — E876 Hypokalemia: Secondary | ICD-10-CM | POA: Diagnosis present

## 2019-09-16 DIAGNOSIS — K859 Acute pancreatitis without necrosis or infection, unspecified: Secondary | ICD-10-CM

## 2019-09-16 DIAGNOSIS — Z823 Family history of stroke: Secondary | ICD-10-CM | POA: Diagnosis not present

## 2019-09-16 DIAGNOSIS — I10 Essential (primary) hypertension: Secondary | ICD-10-CM | POA: Diagnosis present

## 2019-09-16 DIAGNOSIS — F1120 Opioid dependence, uncomplicated: Secondary | ICD-10-CM | POA: Diagnosis present

## 2019-09-16 LAB — HEPARIN LEVEL (UNFRACTIONATED)
Heparin Unfractionated: <0.1 IU/mL — ABNORMAL LOW (ref 0.30–0.70)
Heparin Unfractionated: 0.15 IU/mL — ABNORMAL LOW (ref 0.30–0.70)
Heparin Unfractionated: 0.21 IU/mL — ABNORMAL LOW (ref 0.30–0.70)

## 2019-09-16 LAB — COMPREHENSIVE METABOLIC PANEL
ALT: 13 U/L (ref 0–44)
AST: 11 U/L — ABNORMAL LOW (ref 15–41)
Albumin: 2.3 g/dL — ABNORMAL LOW (ref 3.5–5.0)
Alkaline Phosphatase: 92 U/L (ref 38–126)
Anion gap: 8 (ref 5–15)
BUN: 7 mg/dL (ref 6–20)
CO2: 23 mmol/L (ref 22–32)
Calcium: 7.8 mg/dL — ABNORMAL LOW (ref 8.9–10.3)
Chloride: 110 mmol/L (ref 98–111)
Creatinine, Ser: 0.39 mg/dL — ABNORMAL LOW (ref 0.61–1.24)
GFR calc Af Amer: >60 mL/min (ref >60)
GFR calc non Af Amer: >60 mL/min (ref >60)
Glucose, Bld: 90 mg/dL (ref 70–99)
Potassium: 4 mmol/L (ref 3.5–5.1)
Sodium: 141 mmol/L (ref 135–145)
Total Bilirubin: 0.9 mg/dL (ref 0.3–1.2)
Total Protein: 4.8 g/dL — ABNORMAL LOW (ref 6.5–8.1)

## 2019-09-16 LAB — CBC WITH DIFFERENTIAL/PLATELET
Abs Immature Granulocytes: 0.04 10*3/uL (ref 0.00–0.07)
Basophils Absolute: 0 10*3/uL (ref 0.0–0.1)
Basophils Relative: 0 %
Eosinophils Absolute: 0.2 10*3/uL (ref 0.0–0.5)
Eosinophils Relative: 2 %
HCT: 41.3 % (ref 39.0–52.0)
Hemoglobin: 13.2 g/dL (ref 13.0–17.0)
Immature Granulocytes: 1 %
Lymphocytes Relative: 21 %
Lymphs Abs: 1.8 10*3/uL (ref 0.7–4.0)
MCH: 32 pg (ref 26.0–34.0)
MCHC: 32 g/dL (ref 30.0–36.0)
MCV: 100 fL (ref 80.0–100.0)
Monocytes Absolute: 0.9 10*3/uL (ref 0.1–1.0)
Monocytes Relative: 11 %
Neutro Abs: 5.5 10*3/uL (ref 1.7–7.7)
Neutrophils Relative %: 65 %
Platelets: 293 10*3/uL (ref 150–400)
RBC: 4.13 MIL/uL — ABNORMAL LOW (ref 4.22–5.81)
RDW: 13.9 % (ref 11.5–15.5)
WBC: 8.5 10*3/uL (ref 4.0–10.5)
nRBC: 0 % (ref 0.0–0.2)

## 2019-09-16 LAB — URINALYSIS, ROUTINE W REFLEX MICROSCOPIC
Bilirubin Urine: NEGATIVE
Glucose, UA: NEGATIVE mg/dL
Hgb urine dipstick: NEGATIVE
Ketones, ur: 5 mg/dL — AB
Leukocytes,Ua: NEGATIVE
Nitrite: NEGATIVE
Protein, ur: NEGATIVE mg/dL
Specific Gravity, Urine: 1.006 (ref 1.005–1.030)
pH: 7 (ref 5.0–8.0)

## 2019-09-16 LAB — LIPASE, BLOOD: Lipase: 42 U/L (ref 11–51)

## 2019-09-16 MED ORDER — HYDROMORPHONE HCL 1 MG/ML IJ SOLN
0.5000 mg | INTRAMUSCULAR | Status: DC | PRN
  Administered 2019-09-16 – 2019-09-17 (×7): 0.5 mg via INTRAVENOUS
  Filled 2019-09-16 (×8): qty 0.5

## 2019-09-16 MED ORDER — HEPARIN BOLUS VIA INFUSION
1500.0000 [IU] | Freq: Once | INTRAVENOUS | Status: AC
  Administered 2019-09-16: 1500 [IU] via INTRAVENOUS
  Filled 2019-09-16: qty 1500

## 2019-09-16 MED ORDER — PANCRELIPASE (LIP-PROT-AMYL) 12000-38000 UNITS PO CPEP: 24000.0000 [IU] | ORAL_CAPSULE | ORAL | Status: DC

## 2019-09-16 MED ORDER — HEPARIN BOLUS VIA INFUSION
1300.0000 [IU] | Freq: Once | INTRAVENOUS | Status: AC
  Administered 2019-09-16: 1300 [IU] via INTRAVENOUS
  Filled 2019-09-16: qty 1300

## 2019-09-16 MED ORDER — PANCRELIPASE (LIP-PROT-AMYL) 12000-38000 UNITS PO CPEP
48000.0000 [IU] | ORAL_CAPSULE | Freq: Three times a day (TID) | ORAL | Status: DC
  Administered 2019-09-16 – 2019-09-17 (×4): 48000 [IU] via ORAL
  Filled 2019-09-16 (×4): qty 4

## 2019-09-16 MED ORDER — SODIUM CHLORIDE 0.9 % IV BOLUS
1000.0000 mL | Freq: Once | INTRAVENOUS | Status: AC
  Administered 2019-09-16: 1000 mL via INTRAVENOUS

## 2019-09-16 MED ORDER — NICOTINE 21 MG/24HR TD PT24
21.0000 mg | MEDICATED_PATCH | Freq: Every day | TRANSDERMAL | Status: DC
  Administered 2019-09-16 – 2019-09-17 (×2): 21 mg via TRANSDERMAL
  Filled 2019-09-16 (×2): qty 1

## 2019-09-16 NOTE — Progress Notes (Signed)
ANTICOAGULATION CONSULT NOTE   Pharmacy Consult for IV Heparin Indication: Portal Vein Thrombosis  No Known Allergies  Patient Measurements: Height: 5\' 7"  (170.2 cm) Weight: 45.4 kg (100 lb) IBW/kg (Calculated) : 66.1 Heparin Dosing Weight: 45.4 kg (weight confirmed with RN)  Vital Signs: Temp: 98.6 F (37 C) (08/05 0452) BP: 102/73 (08/05 1327) Pulse Rate: 73 (08/05 0900)  Labs: Recent Labs    09/15/19 2008 09/15/19 2008 09/15/19 2219 09/16/19 0522 09/16/19 1319  HGB 15.6   < > 13.7 13.2  --   HCT 46.8  --  41.9 41.3  --   PLT 300  --  298 293  --   APTT 32  --   --   --   --   LABPROT 13.3  --   --   --   --   INR 1.1  --   --   --   --   HEPARINUNFRC  --   --   --  <0.10* 0.15*  CREATININE 0.54*  --   --  0.39*  --    < > = values in this interval not displayed.    Estimated Creatinine Clearance: 70.9 mL/min (A) (by C-G formula based on SCr of 0.39 mg/dL (L)).   Assessment: 49 yr old with hx of chronic pancreatitis, abdominal pain presented to [facility] ED as instructed by [facility] GI group for management of portal vein thrombosis noted on abdominal CT done yesterday. Pt had been a heavy drinker, but has not been drinking in over 2 yrs. Pt was on no anticoagulants PTA.  Heparin level 0.15 on gtt at 950 units/hr. No issues with line or bleeding reported per RN.  Goal of Therapy:  Heparin level 0.3-0.7 units/ml Monitor platelets by anticoagulation protocol: Yes   Plan:  Rebolus heparin 1500 units Increase heparin gtt to 1100 units/hr Will f/u 6 hr heparin level  [facility], PharmD Clinical Pharmacist 09/16/2019 2:38 PM

## 2019-09-16 NOTE — Progress Notes (Signed)
ANTICOAGULATION CONSULT NOTE   Pharmacy Consult for IV Heparin Indication: Portal Vein Thrombosis  No Known Allergies  Patient Measurements: Height: 5\' 7"  (170.2 cm) Weight: 45.4 kg (100 lb) IBW/kg (Calculated) : 66.1 Heparin Dosing Weight: 45.4 kg (weight confirmed with RN)  Vital Signs: Temp: 98.6 F (37 C) (08/05 0452) Temp Source: Oral (08/04 2351) BP: 95/70 (08/05 0452) Pulse Rate: 72 (08/05 0452)  Labs: Recent Labs    09/15/19 2008 09/15/19 2008 09/15/19 2219 09/16/19 0522  HGB 15.6   < > 13.7 13.2  HCT 46.8  --  41.9 41.3  PLT 300  --  298 293  APTT 32  --   --   --   LABPROT 13.3  --   --   --   INR 1.1  --   --   --   HEPARINUNFRC  --   --   --  <0.10*  CREATININE 0.54*  --   --   --    < > = values in this interval not displayed.    Estimated Creatinine Clearance: 70.9 mL/min (A) (by C-G formula based on SCr of 0.54 mg/dL (L)).   Assessment: 49 yr old with hx of chronic pancreatitis, abdominal pain presented to [facility] ED as instructed by [facility] GI group for management of portal vein thrombosis noted on abdominal CT done yesterday. Pt had been a heavy drinker, but has not been drinking in over 2 yrs. Pt was on no anticoagulants PTA.  Heparin level undetectable on gtt at 750 units/hr. No issues with line or bleeding reported per RN.  Goal of Therapy:  Heparin level 0.3-0.7 units/ml Monitor platelets by anticoagulation protocol: Yes   Plan:  Rebolus heparin 1500 Increase heparin gtt to 950 units/hr Will f/u 6 hr heparin level  [facility], PharmD, BCPS Please see amion for complete clinical pharmacist phone list 09/16/2019,6:17 AM

## 2019-09-16 NOTE — Progress Notes (Signed)
Initial Nutrition Assessment  DOCUMENTATION CODES:   Underweight, Severe malnutrition in context of chronic illness  INTERVENTION:  Will provide oral nutrition supplements with diet advancement as appropriate  NUTRITION DIAGNOSIS:   Severe Malnutrition related to chronic illness (chronic pancreatitis with insufficiency; COPD; MS) as evidenced by per patient/family report, energy intake < 75% for > or equal to 1 month, severe fat depletion, severe muscle depletion, percent weight loss.  GOAL:   Patient will meet greater than or equal to 90% of their needs    MONITOR:   PO intake, Weight trends, Labs, I & O's, Diet advancement, Supplement acceptance  REASON FOR ASSESSMENT:   Malnutrition Screening Tool    ASSESSMENT:  49 year old male with past medical history of alcohol use in remission, last drink 2 years ago, anxiety/depression, chronic pancreatitis with insufficiency followed by [facility] GI, COPD/emphysema, retinal vasculitis, GERD, uveitis, multiple sclerosis, HTN, chronic opioid dependency, severe GAD with panic attacks presented with progressively worsening chronic abdominal pain that has increased in intensity over the past few days, admitted for portal vein thrombosis.  Patient resting quietly in bed this afternoon. He reports ongoing abdominal pain and long history of poor toleration to oral intake due to pancreatic insufficiency despite taking Creon with meals. Patient reports that he is usually able to keep down  pudding and chicken broth. He tries to drink nutrient dense supplements, says it takes him 3 hours to drink one. Patient reports that he is followed by GI department at [facility], previously had a feeding tube and was able to gain weight, reports that he felt healthy, was able to work out which he enjoyed. Patient reports recent discussions with [facility] with plans of PEG placement that are now on hold due to current PVT. He is willing to try Boost Breeze supplement as  well as Prosource with diet advancement, will continue to monitor and order when appropriate.   He recalls weighing 135-140 lb ~5 years ago, hopeful of one day weighing that again. Patient reports 106 lb last week. Per chart, pt weighed 49 kg (107.8 lb) on 06/08/19, weights have trended down ~9 lbs (7.5%) in the last 3 months which is significant.   Medications reviewed and include: Gabapentin, Creon 24,000 units with snacks, 48,000 units with meals, Protonix IVF: NaCl with KCl 20 mEq/L @ 88 ml/hr IV Heparin Labs reviewed   NUTRITION - FOCUSED PHYSICAL EXAM:    Most Recent Value  Orbital Region Severe depletion  Upper Arm Region Severe depletion  Thoracic and Lumbar Region Severe depletion  Buccal Region Severe depletion  Temple Region Moderate depletion  Clavicle Bone Region Severe depletion  Clavicle and Acromion Bone Region Severe depletion  Scapular Bone Region Unable to assess  Dorsal Hand Severe depletion  Patellar Region Severe depletion  Anterior Thigh Region Severe depletion  Posterior Calf Region Severe depletion  Edema (RD Assessment) None  Hair Reviewed  Eyes Reviewed  Mouth Reviewed  Skin Reviewed  Nails Reviewed       Diet Order:   Diet Order            Diet NPO time specified Except for: Ice Chips, Sips with Meds  Diet effective now                 EDUCATION NEEDS:   Education needs have been addressed  Skin:  Skin Assessment: Reviewed RN Assessment  Last BM:  pta  Height:   Ht Readings from Last 1 Encounters:  09/15/19 5\' 7"  (  1.702 m)    Weight:   Wt Readings from Last 1 Encounters:  09/15/19 45.4 kg    Ideal Body Weight:  67.3 kg  BMI:  Body mass index is 15.66 kg/m.  Estimated Nutritional Needs:   Kcal:  1589-1816 (35-40 kcal/kg)  Protein:  80-90  Fluid:  >/= 1.5 L    Lars Masson, RD, LDN Clinical Nutrition After Hours/Weekend Pager # in Amion

## 2019-09-16 NOTE — Progress Notes (Signed)
PROGRESS NOTE   Brent [name]  FWY:[id number] DOB: [birth year] DOA: 09/15/2019 PCP: Gareth Morgan, MD   Chief Complaint  Patient presents with  . Abdominal Pain    Brief Admission History:  49 y.o. male with medical history significant of alcohol abuse, anxiety, depression, panic attacks, chronic pancreatitis, borderline type II DM, BPH, chronic back pain/DDD, history of penile condyloma, COPD/emphysema, epiretinal membrane of both eyes, uveitis, retinal vasculitis, fibromyalgia, GERD, history of headaches, history of hiatal hernia, history of esophageal dilatation, hypertension, multiple sclerosis, bilateral nephrolithiasis is coming to the emergency department complaints of progressively worsening of chronic abdominal pain for the past few days, but particularly intense yesterday and today.  He was found by CT to have extensive portal vein thrombosis.   Assessment & Plan:   Principal Problem:   Portal vein thrombosis Active Problems:   Acute on chronic pancreatitis (HCC)   Loss of weight   Tobacco abuse   Hypokalemia   Protein-calorie malnutrition, severe (HCC)   1. Portal vein thrombosis - He presented with abdominal pain. He is now on IV heparin infusion.  He is hungry.  He wants to try eating.  Full liquid diet ordered.  GI consulted and asked for hematology opinion about anticoagulation options.  2. Chronic pancreatitis - lipase normal this morning.  Full liquid diet with creon as he takes at home.  3. Hypokalemia -repleted.  4. Severe protein cal malnutrition - dietitian consult.   DVT prophylaxis: heparin  Code Status: full  Family Communication:  Disposition:   Status is: Inpatient  Remains inpatient appropriate because:IV treatments appropriate due to intensity of illness or inability to take PO and Inpatient level of care appropriate due to severity of illness   Dispo: The patient is from: Home              Anticipated d/c is to: Home              Anticipated  d/c date is: 1 day              Patient currently is not medically stable to d/c.  Consultants:   GI   Procedures:     Antimicrobials:    Subjective: Pt complains of abdominal pain. Pt reports being very hungry.   Objective: Vitals:   09/16/19 0900 09/16/19 1007 09/16/19 1327 09/16/19 1500  BP: 99/77  102/73 102/69  Pulse: 73   79  Resp: 16   16  Temp:      TempSrc:      SpO2: 99% 98%  98%  Weight:      Height:        Intake/Output Summary (Last 24 hours) at 09/16/2019 1642 Last data filed at 09/16/2019 1500 Gross per 24 hour  Intake 2224.91 ml  Output 1000 ml  Net 1224.91 ml   Filed Weights   09/15/19 1925  Weight: 45.4 kg    Examination:  General exam: Emaciated appearing male. Appears calm and comfortable  Respiratory system: Clear to auscultation. Respiratory effort normal. Cardiovascular system: S1 & S2 heard, RRR. No JVD, murmurs, rubs, gallops or clicks. No pedal edema. Gastrointestinal system: Abdomen is nondistended, soft and generalized tenderness with light palpation. No organomegaly or masses felt. Normal bowel sounds heard. Central nervous system: Alert and oriented. No focal neurological deficits. Extremities: Symmetric 5 x 5 power. Skin: No rashes, lesions or ulcers Psychiatry: Judgement and insight appear normal. Mood & affect appropriate.   Data Reviewed: I have personally reviewed following labs and imaging  studies  CBC: Recent Labs  Lab 09/15/19 2008 09/15/19 2219 09/16/19 0522  WBC 11.8* 11.6* 8.5  NEUTROABS  --   --  5.5  HGB 15.6 13.7 13.2  HCT 46.8 41.9 41.3  MCV 97.9 99.5 100.0  PLT 300 298 293    Basic Metabolic Panel: Recent Labs  Lab 09/15/19 2008 09/16/19 0522  NA 135 141  K 3.4* 4.0  CL 102 110  CO2 25 23  GLUCOSE 96 90  BUN 9 7  CREATININE 0.54* 0.39*  CALCIUM 7.9* 7.8*  MG 2.0  --   PHOS 4.3  --     GFR: Estimated Creatinine Clearance: 70.9 mL/min (A) (by C-G formula based on SCr of 0.39 mg/dL  (L)).  Liver Function Tests: Recent Labs  Lab 09/15/19 2008 09/16/19 0522  AST 17 11*  ALT 16 13  ALKPHOS 116 92  BILITOT 0.6 0.9  PROT 6.2* 4.8*  ALBUMIN 3.0* 2.3*    CBG: No results for input(s): GLUCAP in the last 168 hours.  Recent Results (from the past 240 hour(s))  SARS Coronavirus 2 by RT PCR (hospital order, performed in [facility] hospital lab) Nasopharyngeal Nasopharyngeal Swab     Status: None   Collection Time: 09/15/19  9:57 PM   Specimen: Nasopharyngeal Swab  Result Value Ref Range Status   SARS Coronavirus 2 NEGATIVE NEGATIVE Final    Comment: (NOTE) SARS-CoV-2 target nucleic acids are NOT DETECTED.  The SARS-CoV-2 RNA is generally detectable in upper and lower respiratory specimens during the acute phase of infection. The lowest concentration of SARS-CoV-2 viral copies this assay can detect is 250 copies / mL. A negative result does not preclude SARS-CoV-2 infection and should not be used as the sole basis for treatment or other patient management decisions.  A negative result may occur with improper specimen collection / handling, submission of specimen other than nasopharyngeal swab, presence of viral mutation(s) within the areas targeted by this assay, and inadequate number of viral copies (<250 copies / mL). A negative result must be combined with clinical observations, patient history, and epidemiological information.  Fact Sheet for Patients:   BoilerBrush.com.cy  Fact Sheet for Healthcare Providers: https://pope.com/  This test is not yet approved or  cleared by the [place] FDA and has been authorized for detection and/or diagnosis of SARS-CoV-2 by FDA under an Emergency Use Authorization (EUA).  This EUA will remain in effect (meaning this test can be used) for the duration of the COVID-19 declaration under Section 564(b)(1) of the Act, 21 U.S.C. section 360bbb-3(b)(1), unless the  authorization is terminated or revoked sooner.  Performed at [facility], [address], [place], [place] [address]      Radiology Studies: No results found.   Scheduled Meds: . gabapentin  600 mg Oral QID  . ketorolac  2 drop Both Eyes QID  . lipase/protease/amylase  24,000 Units Oral With snacks  . lipase/protease/amylase  48,000 Units Oral TID WC  . mometasone-formoterol  2 puff Inhalation BID  . nicotine  21 mg Transdermal Daily  . pantoprazole (PROTONIX) IV  40 mg Intravenous QHS  . prednisoLONE acetate  2 drop Both Eyes BID   Continuous Infusions: . 0.9 % NaCl with KCl 20 mEq / L 88 mL/hr at 09/16/19 1500  . heparin 1,100 Units/hr (09/16/19 1458)     LOS: 0 days   Time spent: 25 mins   Juni Glaab Laural Benes, MD How to contact the [facility] Attending or Consulting provider 7A - 7P or  covering provider during after hours 7P -7A, for this patient?  1. Check the care team in [facility] and look for a) attending/consulting [facility] provider listed and b) the [facility] team listed 2. Log into [email or web address] and use [facility]'s universal password to access. If you do not have the password, please contact the hospital operator. 3. Locate the [facility] provider you are looking for under [facility] and page to a number that you can be directly reached. 4. If you still have difficulty reaching the provider, please page the [facility] (Director on Call) for the Hospitalists listed on amion for assistance.  09/16/2019, 4:42 PM

## 2019-09-16 NOTE — Consult Note (Signed)
Referring Provider: Cleora Fleet, MD Primary Care Physician:  Gareth Morgan, MD Primary Gastroenterologist:  Neita Carp PA-C at [facility] GI clinic  Reason for Consultation:  Extensive portal vein thrombosis  HPI: Brent [name] is a 49 y.o. male with past medical history of alcohol use, anxiety, chronic pancreatitis, GERD, uveitis, multiple sclerosis, hypertension, chronic opioid dependency, severe GAD with panic attacks, chronic pancreatic insufficiency, GERD, depression.  Primarily followed by [facility].  We saw last time in the office setting in 2014 and then again April 2021 during hospitalization.  In January 2020 he was admitted to [facility] for alcoholism, pancreatic pseudocyst and acute pancreatitis.  He underwent surgery March 01, 2018 for open cystogastrostomy, placement of GJ feeding tube, pancreatic necrosectomy.  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  When he presented back in April to our facility he had a CT showing acute on chronic pancreatitis overall felt to be mild, partial intraluminal thrombus within the mid and distal portions of the inferior mesenteric vein.  Ultrasound duplex of the abdomen to follow CT, showed widely patent IVC in the visualized portion.  Doppler did not demonstrate definitive flow within the middle and distal portions of the inferior mesenteric vein suggesting possibility of thrombus.  CT was looked at again with radiology who felt concerns in the inferior mesenteric vein likely artifact/filling defect.  Interventional radiology was consulted who also agreed.  Patient seen at [facility] on July 16 for chronic abdominal pain, weight loss, abnormal colon findings on CT.  Plans for colonoscopy and EGD however because of CT findings back in April they decided to repeat CT scan.  CT on August 3 showed extensive  portal vein thrombosis originating at the level of the main portal vein bifurcation predominantly involving the right portal system.  There is short segment involvement of the left portal vein.  Thrombus also present involving the splenic vein at the level of the splenic hilum.  No definitive thrombus identified within the SMV or IMV.  Edematous appearance of the pancreas with peripancreatic stranding may reflect sequelae of acute on chronic pancreatitis and can be correlated with serum lipase.  Mild thickening of the sigmoid and descending colon could be part due to elevated portal pressures, though a nonspecific colitis is not excluded.  No portal venous gas or pneumatosis seen.  Patient reports recent acute on chronic abdominal pain. He has been having pain in the upper abdomen "due to my pancreas" for years. He has history of prior etoh abuse stating he quite from 2014-2018 but then begin having 12 ounces of beer daily until hospitalized in early 2020. However for past several months he has been having early satiety, reduced appetite, postprandial abdominal pain in LUQ associated with food avoidance and subsequent weight loss. His feeding tube was accidentally dislodged in 2020. With tube feedings he was able to get to 130-135 pounds. After pancreas surgery, per [facility] GI note, his weight was 88 pounds. Patient states he is able to eat pudding cups. Having increasing difficulty tolerating diet. He denies heartburn. BM usually daily, initial one firm followed by loose stool. 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 Complains of pp vomiting and newer pain is from LUQ to LLQ.   Patient is taking creon 48000 with meals and 44818 with snacks.   Patient's weight is down from 107 in 05/2019 to 100 now.   Prior to  Admission medications   Medication Sig Start Date End Date Taking? Authorizing Provider  albuterol (PROVENTIL HFA;VENTOLIN HFA) 108 (90 Base) MCG/ACT inhaler Inhale 1-2 puffs into the lungs  every 6 (six) hours as needed for wheezing or shortness of breath.  07/25/15  Yes [provider]  ALPRAZolam Prudy Feeler) 1 MG tablet Take 1 mg by mouth 4 (four) times daily.   Yes [provider]  Calcium Carbonate-Vitamin D (CALCIUM HIGH POTENCY/VITAMIN D) 600-200 MG-UNIT TABS Take 1 tablet by mouth daily.   Yes [provider]  CREON 3000-9500 units CPEP Take 1 capsule by mouth 3 (three) times daily. 05/17/19  Yes [provider]  gabapentin (NEURONTIN) 600 MG tablet Take 600 mg by mouth 4 (four) times daily. 05/21/19  Yes [provider]  HYDROcodone-acetaminophen (NORCO) 10-325 MG tablet Take 1 tablet by mouth 2 (two) times daily as needed. For pain   Yes [provider]  ketorolac (ACULAR) 0.5 % ophthalmic solution Place 2 drops into both eyes 4 (four) times daily.   Yes [provider]  natalizumab (TYSABRI) 300 MG/15ML injection Inject 300 mg into the vein every 30 (thirty) days.    Yes [provider]  omeprazole (PRILOSEC) 40 MG capsule Take 40 mg by mouth at bedtime.   Yes [provider]  ondansetron (ZOFRAN) 4 MG tablet Take 4 mg by mouth every 8 (eight) hours as needed for nausea.  05/28/19  Yes [provider]  prednisoLONE acetate (PRED FORTE) 1 % ophthalmic suspension Place 2 drops into both eyes 2 (two) times daily.  01/24/15  Yes [provider]  SYMBICORT 80-4.5 MCG/ACT inhaler Inhale 1 puff into the lungs 2 (two) times daily. 04/23/19  Yes [provider]  zolpidem (AMBIEN) 10 MG tablet Take 10 mg by mouth at bedtime as needed.   Yes [provider]  Creon 24000, take two with meals and 1 with snacks.   Current Facility-Administered Medications  Medication Dose Route Frequency Provider Last Rate Last Admin   0.9 % NaCl with KCl 20 mEq/ L  infusion   Intravenous Continuous Bobette Mo, MD 88 mL/hr at 09/16/19 0242 New Bag at 09/16/19 0242   acetaminophen (TYLENOL)  tablet 650 mg  650 mg Oral Q6H PRN Bobette Mo, MD       Or   acetaminophen (TYLENOL) suppository 650 mg  650 mg Rectal Q6H PRN Bobette Mo, MD       ALPRAZolam Prudy Feeler) tablet 1 mg  1 mg Oral TID PRN Bobette Mo, MD   1 mg at 09/16/19 0245   gabapentin (NEURONTIN) capsule 600 mg  600 mg Oral QID Bobette Mo, MD   600 mg at 09/16/19 0023   heparin ADULT infusion 100 units/mL (25000 units/25mL sodium chloride 0.45%)  950 Units/hr Intravenous Continuous Titus Mould, RPH 9.5 mL/hr at 09/16/19 1610 950 Units/hr at 09/16/19 0655   HYDROmorphone (DILAUDID) injection 0.5 mg  0.5 mg Intravenous Q3H PRN Johnson, Clanford L, MD       ketorolac (ACULAR) 0.5 % ophthalmic solution 2 drop  2 drop Both Eyes QID Bobette Mo, MD       lipase/protease/amylase (CREON) capsule 12,000 Units  12,000 Units Oral TID [facility] Bobette Mo, MD       mometasone-formoterol [facility]) 100-5 MCG/ACT inhaler 2 puff  2 puff Inhalation BID Bobette Mo, MD       pantoprazole (PROTONIX) injection 40 mg  40 mg Intravenous QHS Sanda Klein  Kelby Fam, MD   40 mg at 09/16/19 0024   prednisoLONE acetate (PRED FORTE) 1 % ophthalmic suspension 2 drop  2 drop Both Eyes BID Bobette Mo, MD       prochlorperazine (COMPAZINE) injection 5 mg  5 mg Intravenous Q4H PRN Bobette Mo, MD        Allergies as of 09/15/2019   (No Known Allergies)    Past Medical History:  Diagnosis Date   Alcohol use    Anxiety    Disabled due to panic attacks   Arthritis    Bilateral ureteral calculi    Borderline type 2 diabetes mellitus    BPH (benign prostatic hypertrophy)    Chronic back pain    Condylomata acuminata in male    multiple procedures --  penile, peri-rectal , perineum   DDD (degenerative disc disease), cervical    Depression    Dyspnea on exertion    Emphysematous COPD (HCC)    Epiretinal membrane (ERM) of both eyes    Fibromyalgia    GERD  (gastroesophageal reflux disease)    Headache(784.0)    History of chronic pancreatitis    severeal adx for this /  2008  dx alcoholic pancreatitis   History of esophageal dilatation    History of hiatal hernia    History of kidney stones    History of panic attacks    History of sepsis    adx 05-01-2014--  urosepsis due to kidney stones obstruction/ hydronephrosis   History of suicidal ideation    2006   adx   Hypertension    was on medication for short time, htn was caused by prednisone per pt   Multiple sclerosis (HCC)    pt. states has 4 small brain lesions   Nephrolithiasis    bilateral    Retinal vasculitis    Uveitis     Past Surgical History:  Procedure Laterality Date   BIOPSY  11/12/2012   Procedure: GASTRIC AND ESOPHAGEAL BIOPSIES;  Surgeon: Corbin Ade, MD;  Location: [facility];  Service: Endoscopy;;   CATARACT EXTRACTION W/ INTRAOCULAR LENS  IMPLANT, BILATERAL     COLONOSCOPY  10/08/2011   Jenkins:Normal colon/Anal condyloma without extension proximal to dentate line   CYSTOSCOPY W/ URETERAL STENT PLACEMENT Bilateral 05/01/2014   Procedure: CYSTOSCOPY WITH BILATERAL RETROGRADE PYELOGRAM; BILATERAL URETERAL STENT PLACEMENT;  Surgeon: Barron Alvine, MD;  Location: [facility];  Service: Urology;  Laterality: Bilateral;   CYSTOSCOPY W/ URETERAL STENT REMOVAL Bilateral 06/06/2014   Procedure: CYSTOSCOPY WITH STENT REMOVAL;  Surgeon: Marcine Matar, MD;  Location: [facility];  Service: Urology;  Laterality: Bilateral;   CYSTOSCOPY WITH URETEROSCOPY  06/06/2014   Procedure: CYSTOSCOPY WITH URETEROSCOPY;  Surgeon: Marcine Matar, MD;  Location: [facility];  Service: Urology;;   CYSTOSCOPY WITH URETEROSCOPY AND STENT PLACEMENT Bilateral 06/06/2014   Procedure: CYSTOSCOPY WITH J2 STENT EXTRACTION,,URETEROSCOPY WITH EXTRACTION OF STONES,;  Surgeon: Marcine Matar, MD;  Location: [facility];  Service: Urology;   Laterality: Bilateral;   ELECTROCAUTERY/ DESICCATION OF CONDYLOMA LESIONS  01-15-2008  &  12-29-2009   PENIS, PERI-RECTAL , PERINUEM   ESOPHAGOGASTRODUODENOSCOPY (EGD) WITH PROPOFOL N/A 11/12/2012   WUJ:WJXBJY esophagus s/p  passage of a Maloney dilator and biopsy. Abnormal gastric mucosa-status post biopsy   FLEXIBLE BRONCHOSCOPY N/A 08/12/2012   Procedure: FLEXIBLE BRONCHOSCOPY;  Surgeon: Fredirick Maudlin, MD;  Location: [facility];  Service: Pulmonary;  Laterality: N/A;   MALONEY DILATION N/A 11/12/2012   Procedure:  MALONEY DILATION (59mm);  Surgeon: Corbin Ade, MD;  Location: [facility];  Service: Endoscopy;  Laterality: N/A;   MULTIPLE EXTRACTIONS WITH ALVEOLOPLASTY N/A 04/22/2014   Procedure: MULTIPLE EXTRACTIONS ( 2,3,5,6,7,8,9,10,11,13,14,15,21,28  WITH ALVEOLOPLASTY;  Surgeon: Ocie Doyne, DDS;  Location: [facility];  Service: Oral Surgery;  Laterality: N/A;   WISDOM TOOTH EXTRACTION      Family History  Problem Relation Age of Onset   Hypertension Mother    Breast cancer Mother    Melanoma Mother    Stroke Mother    Lung cancer Father 42   Colon cancer Neg Hx    Colitis Neg Hx    Cirrhosis Neg Hx    Liver disease Neg Hx    Pancreatic cancer Neg Hx    Pancreatitis Neg Hx    Anesthesia problems Neg Hx    Hypotension Neg Hx    Malignant hyperthermia Neg Hx    Pseudochol deficiency Neg Hx     Social History   Socioeconomic History   Marital status: Single    Spouse name: Not on file   Number of children: 0   Years of education: Not on file   Highest education level: Not on file  Occupational History   Occupation: disabled    Comment: anxiety  Tobacco Use   Smoking status: Current Every Day Smoker    Packs/day: 1.00    Years: 20.00    Pack years: 20.00    Types: Cigarettes, Cigars   Smokeless tobacco: Never Used   Tobacco comment: 1 pack per week now as of 02/24/2018   Vaping Use   Vaping Use: Never used  Substance and Sexual Activity    Alcohol use: Not Currently   Drug use: No   Sexual activity: Not on file  Other Topics Concern   Not on file  Social History Narrative   ** Merged History Encounter **       Social Determinants of Health   Financial Resource Strain:    Difficulty of Paying Living Expenses:   Food Insecurity:    Worried About Programme researcher, broadcasting/film/video in the Last Year:    Barista in the Last Year:   Transportation Needs:    Freight forwarder (Medical):    Lack of Transportation (Non-Medical):   Physical Activity:    Days of Exercise per Week:    Minutes of Exercise per Session:   Stress:    Feeling of Stress :   Social Connections:    Frequency of Communication with Friends and Family:    Frequency of Social Gatherings with Friends and Family:    Attends Religious Services:    Active Member of Clubs or Organizations:    Attends Engineer, structural:    Marital Status:   Intimate Partner Violence:    Fear of Current or Ex-Partner:    Emotionally Abused:    Physically Abused:    Sexually Abused:      ROS:  General: Negative for fever, chills. See hpi Eyes: Negative for vision changes.  ENT: Negative for hoarseness, difficulty swallowing , nasal congestion. CV: Negative for chest pain, angina, palpitations, dyspnea on exertion, peripheral edema.  Respiratory: Negative for dyspnea at rest, dyspnea on exertion, cough, sputum, wheezing.  GI: See history of present illness. GU:  Negative for dysuria, hematuria, urinary incontinence, urinary frequency, nocturnal urination.  MS: Negative for joint pain, low back pain.  Derm: Negative for rash or itching.  Neuro: Negative for weakness, abnormal  sensation, seizure, frequent headaches, memory loss, confusion.  Psych: Negative for anxiety, depression, suicidal ideation, hallucinations.  Endo: see hpi  Heme: Negative for bruising or bleeding. Allergy: Negative for rash or hives.       Physical  Examination: Vital signs in last 24 hours: Temp:  [98.3 F (36.8 C)-98.7 F (37.1 C)] 98.6 F (37 C) (08/05 0452) Pulse Rate:  [66-107] 66 (08/05 0646) Resp:  [12-20] 20 (08/05 0452) BP: (95-108)/(70-94) 102/83 (08/05 0646) SpO2:  [94 %-97 %] 94 % (08/05 0452) Weight:  [45.4 kg] 45.4 kg (08/04 1925)    General: thin male appears uncomfortable. nad. Head: Normocephalic, atraumatic.   Eyes: Conjunctiva pink, no icterus. Mouth: Oropharyngeal mucosa moist and pink , no lesions erythema or exudate. Neck: Supple without thyromegaly, masses, or lymphadenopathy.  Lungs: Clear to auscultation bilaterally.  Heart: Regular rate and rhythm, no murmurs rubs or gallops.  Abdomen: Bowel sounds are normal,   nondistended, no hepatosplenomegaly or masses, no abdominal bruits or    hernia , no rebound or guarding.  Moderate tenderness in epigastric/luq/left mid abdomen Rectal: not performed Extremities: No lower extremity edema, clubbing, deformity.  Neuro: Alert and oriented x 4 , grossly normal neurologically.  Skin: Warm and dry, no rash or jaundice.   Psych: Alert and cooperative, normal mood and affect.        Intake/Output from previous day: 08/04 0701 - 08/05 0700 In: 1161.3 [I.V.:159.4; IV Piggyback:1001.9] Out: 600 [Urine:600] Intake/Output this shift: No intake/output data recorded.  Lab Results: CBC Recent Labs    09/15/19 2008 09/15/19 2219 09/16/19 0522  WBC 11.8* 11.6* 8.5  HGB 15.6 13.7 13.2  HCT 46.8 41.9 41.3  MCV 97.9 99.5 100.0  PLT 300 298 293   BMET Recent Labs    09/15/19 2008 09/16/19 0522  NA 135 141  K 3.4* 4.0  CL 102 110  CO2 25 23  GLUCOSE 96 90  BUN 9 7  CREATININE 0.54* 0.39*  CALCIUM 7.9* 7.8*   LFT Recent Labs    09/15/19 2008 09/16/19 0522  BILITOT 0.6 0.9  ALKPHOS 116 92  AST 17 11*  ALT 16 13  PROT 6.2* 4.8*  ALBUMIN 3.0* 2.3*    Lipase Recent Labs    09/15/19 2008 09/16/19 0522  LIPASE 77* 42    PT/INR Recent Labs     09/15/19 2008  LABPROT 13.3  INR 1.1      Imaging Studies: No results found.Pierre.Alas week]   Impression: 49 year old gentleman with history of prior alcohol abuse, chronic pancreatitis, multiple sclerosis, hypertension, complicated history with prior open cystogastrostomy/placement of GJ feeding tube (dislodged 2020)/pancreatic necrosectomy January 2020 at [facility] presenting for admission due to CT scan showing extensive portal vein thrombosis, performed at [facility] 2 days ago.  As outlined above there was question regarding partial intraluminal thrombus within the mid and distal portions of the IMV back in April, ultimately after review by local radiologist and interventional radiology it was felt that no evidence of intraluminal thrombus and likely artifactual.   Extensive portal vein thrombosis: Seen on CT September 14, 2019.  New finding not apparent on April 2021 CTs.  Thrombus originating at the level of the main portal vein bifurcation predominantly involving the right portal system.  There is a short segment involvement of left portal vein.  Thrombus also present involving the splenic vein at the level of the splenic hilum.  SMV and IMV doubt definitive thrombus.  Etiology  unclear.  He has been started on anticoagulation.  Discussed with Dr. Jena Gauss.  He advises hematology consultation to determine etiology and for management.  Acute on chronic abdominal pain: History of chronic abdominal pain in the setting of chronic pancreatitis.  Now with acute component with portal vein thrombosis apparent as well as CT showing acute on chronic pancreatitis.  Patient has a significant postprandial component, aversion to eating.  7 pound weight loss since April.  Patient reports weighing 130-135 pounds when he was receiving tube feedings.  He continues with pancreatic enzymes.  Denies alcohol use.  He does use aspirin powders on a regular basis therefore cannot  exclude peptic ulcer disease.  Also with mild thickening of the sigmoid and descending colon noted on current CT as well as some involvement of the moderate ascending and transverse colon, less of the descending and sigmoid colon on prior CT at [facility] back in April 2021.  He was in the process of being scheduled for an upper endoscopy and colonoscopy prior to CT findings.  Plan: 1. PPI twice daily 2. Hematology consultation for extensive portal vein thrombosis,?Etiology and to assist with management.  I have paged the on-call provider, Dr. Ellin Saba regarding consultation request but have not heard back yet. 3. Creon 48000 with meals and 19147 with snacks when he begins oral intake. 4. Avoid aspirin powders. 5. Would advise follow up with his primary GI at [facility] to complete EGD/colonoscopy when able.    We would like to thank you for the opportunity to participate in the care of Brent [name].  Leanna Battles. [facility] [phone] 8/5/20211:25 PM    LOS: 0 days

## 2019-09-17 DIAGNOSIS — E43 Unspecified severe protein-calorie malnutrition: Secondary | ICD-10-CM

## 2019-09-17 DIAGNOSIS — R1084 Generalized abdominal pain: Secondary | ICD-10-CM

## 2019-09-17 LAB — CBC WITH DIFFERENTIAL/PLATELET
Abs Immature Granulocytes: 0.03 10*3/uL (ref 0.00–0.07)
Basophils Absolute: 0 10*3/uL (ref 0.0–0.1)
Basophils Relative: 1 %
Eosinophils Absolute: 0.2 10*3/uL (ref 0.0–0.5)
Eosinophils Relative: 2 %
HCT: 41.7 % (ref 39.0–52.0)
Hemoglobin: 13.4 g/dL (ref 13.0–17.0)
Immature Granulocytes: 0 %
Lymphocytes Relative: 22 %
Lymphs Abs: 1.6 10*3/uL (ref 0.7–4.0)
MCH: 32.1 pg (ref 26.0–34.0)
MCHC: 32.1 g/dL (ref 30.0–36.0)
MCV: 100 fL (ref 80.0–100.0)
Monocytes Absolute: 0.8 10*3/uL (ref 0.1–1.0)
Monocytes Relative: 11 %
Neutro Abs: 4.7 10*3/uL (ref 1.7–7.7)
Neutrophils Relative %: 64 %
Platelets: 260 10*3/uL (ref 150–400)
RBC: 4.17 MIL/uL — ABNORMAL LOW (ref 4.22–5.81)
RDW: 14.2 % (ref 11.5–15.5)
WBC: 7.3 10*3/uL (ref 4.0–10.5)
nRBC: 0 % (ref 0.0–0.2)

## 2019-09-17 LAB — COMPREHENSIVE METABOLIC PANEL
ALT: 12 U/L (ref 0–44)
AST: 13 U/L — ABNORMAL LOW (ref 15–41)
Albumin: 2.3 g/dL — ABNORMAL LOW (ref 3.5–5.0)
Alkaline Phosphatase: 89 U/L (ref 38–126)
Anion gap: 6 (ref 5–15)
BUN: <5 mg/dL — ABNORMAL LOW (ref 6–20)
CO2: 22 mmol/L (ref 22–32)
Calcium: 7.9 mg/dL — ABNORMAL LOW (ref 8.9–10.3)
Chloride: 112 mmol/L — ABNORMAL HIGH (ref 98–111)
Creatinine, Ser: 0.37 mg/dL — ABNORMAL LOW (ref 0.61–1.24)
GFR calc Af Amer: >60 mL/min (ref >60)
GFR calc non Af Amer: >60 mL/min (ref >60)
Glucose, Bld: 97 mg/dL (ref 70–99)
Potassium: 3.8 mmol/L (ref 3.5–5.1)
Sodium: 140 mmol/L (ref 135–145)
Total Bilirubin: 0.6 mg/dL (ref 0.3–1.2)
Total Protein: 4.7 g/dL — ABNORMAL LOW (ref 6.5–8.1)

## 2019-09-17 LAB — HEPARIN LEVEL (UNFRACTIONATED): Heparin Unfractionated: 0.44 IU/mL (ref 0.30–0.70)

## 2019-09-17 LAB — LIPASE, BLOOD: Lipase: 37 U/L (ref 11–51)

## 2019-09-17 MED ORDER — APIXABAN 5 MG PO TABS
10.0000 mg | ORAL_TABLET | Freq: Two times a day (BID) | ORAL | Status: DC
  Administered 2019-09-17: 10 mg via ORAL
  Filled 2019-09-17: qty 2

## 2019-09-17 MED ORDER — PROSOURCE PLUS PO LIQD
30.0000 mL | Freq: Two times a day (BID) | ORAL | Status: DC
  Administered 2019-09-17: 30 mL via ORAL
  Filled 2019-09-17 (×2): qty 30

## 2019-09-17 MED ORDER — APIXABAN 5 MG PO TABS: 5.0000 mg | ORAL_TABLET | Freq: Two times a day (BID) | ORAL | Status: DC

## 2019-09-17 MED ORDER — ENSURE ENLIVE PO LIQD
237.0000 mL | ORAL | Status: DC
  Administered 2019-09-17: 237 mL via ORAL

## 2019-09-17 MED ORDER — HYDROCODONE-ACETAMINOPHEN 10-325 MG PO TABS: 1.0000 | ORAL_TABLET | Freq: Four times a day (QID) | ORAL | 0 refills | Status: DC | PRN

## 2019-09-17 MED ORDER — APIXABAN (ELIQUIS) VTE STARTER PACK (10MG AND 5MG)
ORAL_TABLET | ORAL | 0 refills | Status: DC
Start: 2019-09-17 — End: 2021-12-05

## 2019-09-17 MED ORDER — ALPRAZOLAM 1 MG PO TABS: 1.0000 mg | ORAL_TABLET | Freq: Two times a day (BID) | ORAL | 0 refills | Status: DC | PRN

## 2019-09-17 MED ORDER — BOOST / RESOURCE BREEZE PO LIQD CUSTOM
1.0000 | Freq: Two times a day (BID) | ORAL | Status: DC
  Administered 2019-09-17: 1 via ORAL

## 2019-09-17 NOTE — Progress Notes (Signed)
ANTICOAGULATION CONSULT NOTE   Pharmacy Consult for IV Heparin>> Eliquis Indication: Portal Vein Thrombosis  No Known Allergies  Patient Measurements: Height: 5\' 7"  (170.2 cm) Weight: 45.4 kg (100 lb) IBW/kg (Calculated) : 66.1 Heparin Dosing Weight: 45.4 kg (weight confirmed with RN)  Vital Signs:    Labs: Recent Labs    09/15/19 2008 09/15/19 2008 09/15/19 2219 09/15/19 2219 09/16/19 0522 09/16/19 0522 09/16/19 1319 09/16/19 2110 09/17/19 0635  HGB 15.6   < > 13.7   < > 13.2  --   --   --  13.4  HCT 46.8   < > 41.9  --  41.3  --   --   --  41.7  PLT 300   < > 298  --  293  --   --   --  260  APTT 32  --   --   --   --   --   --   --   --   LABPROT 13.3  --   --   --   --   --   --   --   --   INR 1.1  --   --   --   --   --   --   --   --   HEPARINUNFRC  --   --   --   --  <0.10*   < > 0.15* 0.21* 0.44  CREATININE 0.54*  --   --   --  0.39*  --   --   --  0.37*   < > = values in this interval not displayed.    Estimated Creatinine Clearance: 70.9 mL/min (A) (by C-G formula based on SCr of 0.37 mg/dL (L)).   Assessment: 49 yr old with hx of chronic pancreatitis, abdominal pain presented to [facility] ED as instructed by [facility] GI group for management of portal vein thrombosis noted on abdominal CT done yesterday. Pt had been a heavy drinker, but has not been drinking in over 2 yrs. Pt was on no anticoagulants PTA.   Goal of Therapy:   Monitor platelets by anticoagulation protocol: Yes   Plan:  Stop heparin infusion. Start apixaban 10 mg twice daily x 7 days followed by apixaban 5 mg twice daily. Monitor labs and s/s of bleeding.   [facility], PharmD Clinical Pharmacist 09/17/2019 12:41 PM

## 2019-09-17 NOTE — Discharge Summary (Addendum)
Physician Discharge Summary  Brent [name] TRR:[id number] DOB: 03/07/1970 DOA: 09/15/2019  PCP: Gareth Morgan, MD GI: [name] [facility]  Admit date: 09/15/2019 Discharge date: 09/17/2019   Admitted From:  Home  Disposition:  Home   Recommendations for Outpatient Follow-up:  1. Follow up with [facility] Dr. Boyd Kerbs on 09/24/19 as scheduled 2. Follow up with PCP in 3-5 days for recheck   Discharge Condition: STABLE  CODE STATUS: FULL    Brief Hospitalization Summary: Please see all hospital notes, images, labs for full details of the hospitalization. ADMISSION HPI: Brent [name] is a 49 y.o. male with medical history significant of alcohol abuse, anxiety, depression, panic attacks, chronic pancreatitis, borderline type II DM, BPH, chronic back pain/DDD, history of penile condyloma, COPD/emphysema, epiretinal membrane of both eyes, uveitis, retinal vasculitis, fibromyalgia, GERD, history of headaches, history of hiatal hernia, history of esophageal dilatation, hypertension, multiple sclerosis, bilateral nephrolithiasis is coming to the emergency department complaints of progressively worsening of chronic abdominal pain for the past few days, but particularly intense yesterday and today.  He has had some episodes of nausea and emesis.  He has states that it is not unusual for him to occasionally vomit.  He denies constipation, melena or hematochezia.  He has frequent loose stools.  He denies dysuria, frequency or hematuria.  He has not been able to eat much in the past few days.  Denies fever, chills, night sweats, rhinorrhea, sore throat, dyspnea, wheezing or hemoptysis.  No chest pain, palpitations, diaphoresis, PND, orthopnea or pitting edema of the lower extremities.  He denies polyuria, polydipsia, polyphagia or blurred vision.  ED Course: Initial vital signs were temperature 98.7 F, pulse 107, respiration 18, blood pressure 97/75 mmHg and O2 sat 94% on room air. The  patient received a 1000 mL NS bolus, hydromorphone 1 mg IVP x1 and was started on a heparin infusion.  CBC showed a white count 11.8, hemoglobin 15.6 g/dL and platelets 333.  Lipase was 77.CMP showed a potassium 3.4 mmol/L, corrected calcium of 8.7 mg/dL.  Total protein was 6.2 and albumin 3.0 g/dL. The rest of the electrolytes and hepatic functions are within normal range.  Renal function was normal.  Imaging: CT abdomen/pelvis with contrast done yesterday at [facility] show extensive portal vein thrombosis. Please see the full report below on radiological exams on admission tap.  Brief Admission History:  48 y.o.malewith medical history significant ofalcohol abuse, anxiety, depression, panic attacks, chronic pancreatitis, borderline type II DM, BPH, chronic back pain/DDD, history of penile condyloma, COPD/emphysema, epiretinal membrane of both eyes, uveitis, retinal vasculitis, fibromyalgia, GERD, history of headaches, history of hiatal hernia, history of esophageal dilatation, hypertension, multiple sclerosis, bilateral nephrolithiasis is coming to the emergency department complaints of progressively worseningof chronic abdominal pain for the past few days, but particularly intense yesterday and today.  He was found by CT to have extensive portal vein thrombosis.   Assessment & Plan:   Principal Problem:   Portal vein thrombosis Active Problems:   Acute on chronic pancreatitis   Loss of weight   Tobacco abuse   Hypokalemia   Protein-calorie malnutrition, severe   1. Portal vein thrombosis - He presented with abdominal pain. He was fully anticoagulated on IV heparin infusion.  I spoke with hematologist Dr. Ellin Saba and pt transitioned over to apixaban DOAC therapy with goal to treat for 3 months but defer to his GI physicians at [facility].  He has appt with Dr. [name] GI  at [facility] on 09/24/19.  He was counseled by pharmD on apixaban and given written information and 30 day  coupon.  Bleeding precautions while on anticoagulant therapy reviewed.   He has been tolerating his diet.  GI consulted and agreed with anticoagulation.  Pt has no known history of esophageal varices.  Close outpatient follow up with GI at [facility] recommended.  Pt verbalized understanding.  Pt advised to stop smoking.  I have given Rx for his Vicodin 10/325 mg pain tabs to use as needed for severe abdominal pain.    2. Chronic pancreatitis - lipase normal this morning.  Pt is tolerating diet with creon as he takes at home.  3. Hypokalemia -repleted.  4. Severe protein cal malnutrition - dietitian consulted to see in hospital.   DVT prophylaxis: heparin  Code Status: full  Family Communication:  Disposition:  Home    Discharge Diagnoses:  Principal Problem:   Portal vein thrombosis Active Problems:   Acute on chronic pancreatitis (HCC)   Loss of weight   Tobacco abuse   Hypokalemia   Protein-calorie malnutrition, severe (HCC)   Generalized abdominal pain   Discharge Instructions:  Allergies as of 09/17/2019   No Known Allergies     Medication List    TAKE these medications   albuterol 108 (90 Base) MCG/ACT inhaler Commonly known as: VENTOLIN HFA Inhale 1-2 puffs into the lungs every 6 (six) hours as needed for wheezing or shortness of breath.   ALPRAZolam 1 MG tablet Commonly known as: XANAX Take 1 tablet (1 mg total) by mouth 2 (two) times daily as needed for anxiety. What changed:   when to take this  reasons to take this   Apixaban Starter Pack (  and ) Commonly known as: ELIQUIS STARTER PACK Take as directed on package: start with two-5mg  tablets twice daily for 7 days. On day 8, switch to one-5mg  tablet twice daily.   Calcium High Potency/Vitamin D 600-200 MG-UNIT Tabs Generic drug: Calcium Carbonate-Vitamin D Take 1 tablet by mouth daily.   Creon 3000-9500 units Cpep Generic drug: Pancrelipase (Lip-Prot-Amyl) Take 1 capsule by mouth 3 (three)  times daily.   gabapentin 600 MG tablet Commonly known as: NEURONTIN Take 600 mg by mouth 4 (four) times daily.   HYDROcodone-acetaminophen 10-325 MG tablet Commonly known as: NORCO Take 1 tablet by mouth every 6 (six) hours as needed for severe pain. For pain What changed:   when to take this  reasons to take this   ketorolac 0.5 % ophthalmic solution Commonly known as: ACULAR Place 2 drops into both eyes 4 (four) times daily.   natalizumab 300 MG/15ML injection Commonly known as: TYSABRI Inject 300 mg into the vein every 30 (thirty) days.   omeprazole 40 MG capsule Commonly known as: PRILOSEC Take 40 mg by mouth at bedtime.   ondansetron 4 MG tablet Commonly known as: ZOFRAN Take 4 mg by mouth every 8 (eight) hours as needed for nausea.   prednisoLONE acetate 1 % ophthalmic suspension Commonly known as: PRED FORTE Place 2 drops into both eyes 2 (two) times daily.   Symbicort 80-4.5 MCG/ACT inhaler Generic drug: budesonide-formoterol Inhale 1 puff into the lungs 2 (two) times daily.   zolpidem 10 MG tablet Commonly known as: AMBIEN Take 10 mg by mouth at bedtime as needed.       Follow-up Information    [name], MD. Go on 09/24/2019.   Why: Hospital Follow Up appointment  Contact information: [address] [place]  [place] [address] [phone]        Gareth Morgan, MD. Schedule an appointment as soon as possible for a visit in 4 day(s).   Specialty: Family Medicine Why: Hospital Follow up  Contact information: [address] [place] [address] [phone]              No Known Allergies Allergies as of 09/17/2019   No Known Allergies     Medication List    TAKE these medications   albuterol 108 (90 Base) MCG/ACT inhaler Commonly known as: VENTOLIN HFA Inhale 1-2 puffs into the lungs every 6 (six) hours as needed for wheezing or shortness of breath.   ALPRAZolam 1 MG tablet Commonly known as: XANAX Take 1 tablet (1  mg total) by mouth 2 (two) times daily as needed for anxiety. What changed:   when to take this  reasons to take this   Apixaban Starter Pack (10mg  and 5mg ) Commonly known as: ELIQUIS STARTER PACK Take as directed on package: start with two-5mg  tablets twice daily for 7 days. On day 8, switch to one-5mg  tablet twice daily.   Calcium High Potency/Vitamin D 600-200 MG-UNIT Tabs Generic drug: Calcium Carbonate-Vitamin D Take 1 tablet by mouth daily.   Creon 3000-9500 units Cpep Generic drug: Pancrelipase (Lip-Prot-Amyl) Take 1 capsule by mouth 3 (three) times daily.   gabapentin 600 MG tablet Commonly known as: NEURONTIN Take 600 mg by mouth 4 (four) times daily.   HYDROcodone-acetaminophen 10-325 MG tablet Commonly known as: NORCO Take 1 tablet by mouth every 6 (six) hours as needed for severe pain. For pain What changed:   when to take this  reasons to take this   ketorolac 0.5 % ophthalmic solution Commonly known as: ACULAR Place 2 drops into both eyes 4 (four) times daily.   natalizumab 300 MG/15ML injection Commonly known as: TYSABRI Inject 300 mg into the vein every 30 (thirty) days.   omeprazole 40 MG capsule Commonly known as: PRILOSEC Take 40 mg by mouth at bedtime.   ondansetron 4 MG tablet Commonly known as: ZOFRAN Take 4 mg by mouth every 8 (eight) hours as needed for nausea.   prednisoLONE acetate 1 % ophthalmic suspension Commonly known as: PRED FORTE Place 2 drops into both eyes 2 (two) times daily.   Symbicort 80-4.5 MCG/ACT inhaler Generic drug: budesonide-formoterol Inhale 1 puff into the lungs 2 (two) times daily.   zolpidem 10 MG tablet Commonly known as: AMBIEN Take 10 mg by mouth at bedtime as needed.       Procedures/Studies: No results found.   Subjective: Pt reports that he has been tolerating diet, he had a little nausea but otherwise tolerated it well.  Pain meds helping his abdominal pain.  No bleeding reported.     Discharge Exam: Vitals:   09/17/19 0754 09/17/19 1441  BP:  92/70  Pulse:  82  Resp:  16  Temp:  98.1 F (36.7 C)  SpO2: 97% 95%   Vitals:   09/16/19 1929 09/16/19 2200 09/17/19 0754 09/17/19 1441  BP:  108/79  92/70  Pulse:  80  82  Resp:  14  16  Temp:  98.8 F (37.1 C)  98.1 F (36.7 C)  TempSrc:  Oral  Oral  SpO2: 98% 96% 97% 95%  Weight:      Height:       General: Pt is alert, awake, not in acute distress Cardiovascular:  normal S1/S2 +, no rubs, no gallops Respiratory: CTA bilaterally, no wheezing,  no rhonchi Abdominal: Soft, mild nonspecific tenderness, no guarding,  ND, bowel sounds + Extremities: no edema, no cyanosis   The results of significant diagnostics from this hospitalization (including imaging, microbiology, ancillary and laboratory) are listed below for reference.     Microbiology: Recent Results (from the past 240 hour(s))  SARS Coronavirus 2 by RT PCR (hospital order, performed in [facility] hospital lab) Nasopharyngeal Nasopharyngeal Swab     Status: None   Collection Time: 09/15/19  9:57 PM   Specimen: Nasopharyngeal Swab  Result Value Ref Range Status   SARS Coronavirus 2 NEGATIVE NEGATIVE Final    Comment: (NOTE) SARS-CoV-2 target nucleic acids are NOT DETECTED.  The SARS-CoV-2 RNA is generally detectable in upper and lower respiratory specimens during the acute phase of infection. The lowest concentration of SARS-CoV-2 viral copies this assay can detect is 250 copies / mL. A negative result does not preclude SARS-CoV-2 infection and should not be used as the sole basis for treatment or other patient management decisions.  A negative result may occur with improper specimen collection / handling, submission of specimen other than nasopharyngeal swab, presence of viral mutation(s) within the areas targeted by this assay, and inadequate number of viral copies (<250 copies / mL). A negative result must be combined with  clinical observations, patient history, and epidemiological information.  Fact Sheet for Patients:   BoilerBrush.com.cy  Fact Sheet for Healthcare Providers: https://pope.com/  This test is not yet approved or  cleared by the [place] FDA and has been authorized for detection and/or diagnosis of SARS-CoV-2 by FDA under an Emergency Use Authorization (EUA).  This EUA will remain in effect (meaning this test can be used) for the duration of the COVID-19 declaration under Section 564(b)(1) of the Act, 21 U.S.C. section 360bbb-3(b)(1), unless the authorization is terminated or revoked sooner.  Performed at [facility], [address] [place], [place] [address]      Labs: BNP (last 3 results) No results for input(s): BNP in the last 8760 hours. Basic Metabolic Panel: Recent Labs  Lab 09/15/19 2008 09/16/19 0522 09/17/19 0635  NA 135 141 140  K 3.4* 4.0 3.8  CL 102 110 112*  CO2 25 23 22   GLUCOSE 96 90 97  BUN 9 7 <5*  CREATININE 0.54* 0.39* 0.37*  CALCIUM 7.9* 7.8* 7.9*  MG 2.0  --   --   PHOS 4.3  --   --    Liver Function Tests: Recent Labs  Lab 09/15/19 2008 09/16/19 0522 09/17/19 0635  AST 17 11* 13*  ALT 16 13 12   ALKPHOS 116 92 89  BILITOT 0.6 0.9 0.6  PROT 6.2* 4.8* 4.7*  ALBUMIN 3.0* 2.3* 2.3*   Recent Labs  Lab 09/15/19 2008 09/16/19 0522 09/17/19 0635  LIPASE 77* 42 37   No results for input(s): AMMONIA in the last 168 hours. CBC: Recent Labs  Lab 09/15/19 2008 09/15/19 2219 09/16/19 0522 09/17/19 0635  WBC 11.8* 11.6* 8.5 7.3  NEUTROABS  --   --  5.5 4.7  HGB 15.6 13.7 13.2 13.4  HCT 46.8 41.9 41.3 41.7  MCV 97.9 99.5 100.0 100.0  PLT 300 298 293 260   Cardiac Enzymes: No results for input(s): CKTOTAL, CKMB, CKMBINDEX, TROPONINI in the last 168 hours. BNP: Invalid input(s): POCBNP CBG: No results for input(s): GLUCAP in the last 168 hours. D-Dimer No results for input(s):  DDIMER in the last 72 hours. Hgb A1c No results for input(s): HGBA1C in the last 72  hours. Lipid Profile No results for input(s): CHOL, HDL, LDLCALC, TRIG, CHOLHDL, LDLDIRECT in the last 72 hours. Thyroid function studies No results for input(s): TSH, T4TOTAL, T3FREE, THYROIDAB in the last 72 hours.  Invalid input(s): FREET3 Anemia work up No results for input(s): VITAMINB12, FOLATE, FERRITIN, TIBC, IRON, RETICCTPCT in the last 72 hours. Urinalysis    Component Value Date/Time   COLORURINE YELLOW 09/15/2019 1130   APPEARANCEUR CLEAR 09/15/2019 1130   LABSPEC 1.006 09/15/2019 1130   PHURINE 7.0 09/15/2019 1130   GLUCOSEU NEGATIVE 09/15/2019 1130   HGBUR NEGATIVE 09/15/2019 1130   BILIRUBINUR NEGATIVE 09/15/2019 1130   KETONESUR 5 (A) 09/15/2019 1130   PROTEINUR NEGATIVE 09/15/2019 1130   UROBILINOGEN 0.2 11/06/2014 2345   NITRITE NEGATIVE 09/15/2019 1130   LEUKOCYTESUR NEGATIVE 09/15/2019 1130   Sepsis Labs Invalid input(s): PROCALCITONIN,  WBC,  LACTICIDVEN Microbiology Recent Results (from the past 240 hour(s))  SARS Coronavirus 2 by RT PCR (hospital order, performed in [facility] hospital lab) Nasopharyngeal Nasopharyngeal Swab     Status: None   Collection Time: 09/15/19  9:57 PM   Specimen: Nasopharyngeal Swab  Result Value Ref Range Status   SARS Coronavirus 2 NEGATIVE NEGATIVE Final    Comment: (NOTE) SARS-CoV-2 target nucleic acids are NOT DETECTED.  The SARS-CoV-2 RNA is generally detectable in upper and lower respiratory specimens during the acute phase of infection. The lowest concentration of SARS-CoV-2 viral copies this assay can detect is 250 copies / mL. A negative result does not preclude SARS-CoV-2 infection and should not be used as the sole basis for treatment or other patient management decisions.  A negative result may occur with improper specimen collection / handling, submission of specimen other than nasopharyngeal swab, presence of viral  mutation(s) within the areas targeted by this assay, and inadequate number of viral copies (<250 copies / mL). A negative result must be combined with clinical observations, patient history, and epidemiological information.  Fact Sheet for Patients:   BoilerBrush.com.cy  Fact Sheet for Healthcare Providers: https://pope.com/  This test is not yet approved or  cleared by the [place] FDA and has been authorized for detection and/or diagnosis of SARS-CoV-2 by FDA under an Emergency Use Authorization (EUA).  This EUA will remain in effect (meaning this test can be used) for the duration of the COVID-19 declaration under Section 564(b)(1) of the Act, 21 U.S.C. section 360bbb-3(b)(1), unless the authorization is terminated or revoked sooner.  Performed at [facility], [address], [place], [place] [address]    Time coordinating discharge:   SIGNED:  Standley Dakins, MD  [facility] 09/17/2019, 3:43 PM How to contact the [facility] Attending or Consulting provider 7A - 7P or covering provider during after hours 7P -7A, for this patient?  1. Check the care team in [facility] and look for a) attending/consulting [facility] provider listed and b) the [facility] team listed 2. Log into [email or web address] and use [facility]'s universal password to access. If you do not have the password, please contact the hospital operator. 3. Locate the [facility] provider you are looking for under [facility] and page to a number that you can be directly reached. 4. If you still have difficulty reaching the provider, please page the [facility] (Director on Call) for the Hospitalists listed on amion for assistance.

## 2019-09-17 NOTE — Progress Notes (Signed)
ANTICOAGULATION CONSULT NOTE   Pharmacy Consult for IV Heparin Indication: Portal Vein Thrombosis  No Known Allergies  Patient Measurements: Height: 5\' 7"  (170.2 cm) Weight: 45.4 kg (100 lb) IBW/kg (Calculated) : 66.1 Heparin Dosing Weight: 45.4 kg (weight confirmed with RN)  Vital Signs: Temp: 98.8 F (37.1 C) (08/05 2200) Temp Source: Oral (08/05 2200) BP: 108/79 (08/05 2200) Pulse Rate: 80 (08/05 2200)  Labs: Recent Labs    09/15/19 2008 09/15/19 2008 09/15/19 2219 09/15/19 2219 09/16/19 0522 09/16/19 0522 09/16/19 1319 09/16/19 2110 09/17/19 0635  HGB 15.6   < > 13.7   < > 13.2  --   --   --  13.4  HCT 46.8   < > 41.9  --  41.3  --   --   --  41.7  PLT 300   < > 298  --  293  --   --   --  260  APTT 32  --   --   --   --   --   --   --   --   LABPROT 13.3  --   --   --   --   --   --   --   --   INR 1.1  --   --   --   --   --   --   --   --   HEPARINUNFRC  --   --   --   --  <0.10*   < > 0.15* 0.21* 0.44  CREATININE 0.54*  --   --   --  0.39*  --   --   --   --    < > = values in this interval not displayed.    Estimated Creatinine Clearance: 70.9 mL/min (A) (by C-G formula based on SCr of 0.39 mg/dL (L)).   Assessment: 49 yr old with hx of chronic pancreatitis, abdominal pain presented to [facility] ED as instructed by [facility] GI group for management of portal vein thrombosis noted on abdominal CT done yesterday. Pt had been a heavy drinker, but has not been drinking in over 2 yrs. Pt was on no anticoagulants PTA.  Heparin level 0.44 on gtt at 1200 units/hr. No issues with line or bleeding reported per RN.  Goal of Therapy:  Heparin level 0.3-0.7 units/ml Monitor platelets by anticoagulation protocol: Yes   Plan:  Continue heparin gtt to 1200 units/hr Will f/u 6 hr heparin level  [facility], PharmD Clinical Pharmacist 09/17/2019 8:02 AM

## 2019-09-17 NOTE — Progress Notes (Signed)
Subjective: States he's dong ok. Tolerated his diet last night and this morning. Still with some left-sided to lower abdominal discomfort. Some nausea, but antiemetic helps. No vomiting. No bowel movement in 24 hours. No obvious bleeding that he's seen.  Objective: Vital signs in last 24 hours: Temp:  [98.8 F (37.1 C)] 98.8 F (37.1 C) (08/05 2200) Pulse Rate:  [79-80] 80 (08/05 2200) Resp:  [14-16] 14 (08/05 2200) BP: (102-108)/(69-79) 108/79 (08/05 2200) SpO2:  [96 %-98 %] 97 % (08/06 0754)   General:   Alert and oriented, pleasant Head:  Normocephalic and atraumatic. Eyes:  No icterus, sclera clear. Conjuctiva pink.  Heart:  S1, S2 present, no murmurs noted.  Lungs: Clear to auscultation bilaterally, without wheezing, rales, or rhonchi.  Abdomen:  Bowel sounds present, soft, non-distended. Noted left-sided abdominal TTP; mild/minimal right-sided/RUQ TTP. No HSM or hernias noted. No rebound or guarding. No masses appreciated  Msk:  Symmetrical without gross deformities. Pulses:  Normal bilateral DP pulses noted. Extremities:  Without clubbing or edema. Neurologic:  Alert and  oriented x4;  grossly normal neurologically. Psych:  Alert and cooperative. Normal mood and affect.  Intake/Output from previous day: 08/05 0701 - 08/06 0700 In: 2454.8 [I.V.:2454.8] Out: 2700 [Urine:2700] Intake/Output this shift: Total I/O In: -  Out: 800 [Urine:800]  Lab Results: Recent Labs    09/15/19 2219 09/16/19 0522 09/17/19 0635  WBC 11.6* 8.5 7.3  HGB 13.7 13.2 13.4  HCT 41.9 41.3 41.7  PLT 298 293 260   BMET Recent Labs    09/15/19 2008 09/16/19 0522 09/17/19 0635  NA 135 141 140  K 3.4* 4.0 3.8  CL 102 110 112*  CO2 25 23 22   GLUCOSE 96 90 97  BUN 9 7 <5*  CREATININE 0.54* 0.39* 0.37*  CALCIUM 7.9* 7.8* 7.9*   LFT Recent Labs    09/15/19 2008 09/16/19 0522 09/17/19 0635  PROT 6.2* 4.8* 4.7*  ALBUMIN 3.0* 2.3* 2.3*  AST 17 11* 13*  ALT 16 13 12   ALKPHOS  116 92 89  BILITOT 0.6 0.9 0.6   PT/INR Recent Labs    09/15/19 2008  LABPROT 13.3  INR 1.1   Hepatitis Panel No results for input(s): HEPBSAG, HCVAB, HEPAIGM, HEPBIGM in the last 72 hours.   Studies/Results: No results found.  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  As outlined above there was question regarding partial intraluminal thrombus within the mid and distal portions of the IMV back in April, ultimately after review by local radiologist and interventional radiology it was felt that no evidence of intraluminal thrombus and likely artifactual.   Extensive portal vein thrombosis: Seen on CT September 14, 2019.  New finding not apparent on April 2021 CTs.  Thrombus originating at the level of the main portal vein bifurcation predominantly involving the right portal system.  There is a short segment involvement of left portal vein.  Thrombus also present involving the splenic vein at the level of the splenic hilum.  SMV and IMV doubt definitive thrombus.  Etiology unclear.  He has been started on anticoagulation via IV Heparin. Recommended hematology consult who was paged but not seen yet. Per hospitalist plans to d/c on  DOAC anticoagulation to follow-up at [facility] with his primary GI.  Acute on chronic abdominal pain: History of chronic abdominal pain in the setting of chronic pancreatitis.  Now with acute component with portal vein thrombosis apparent as well as CT showing acute on chronic pancreatitis.  Patient has a significant postprandial component, aversion to eating. Noted 7 pound  weight loss since April.  Patient reports weighing 130-135 pounds when he was receiving tube feedings.  He continues with pancreatic enzymes.  Denies alcohol use.  He does use aspirin powders on a regular basis therefore cannot exclude peptic ulcer disease.  Also with mild thickening of the sigmoid and descending colon noted on current CT as well as some involvement of the moderate ascending and transverse colon, less of the descending and sigmoid colon on prior CT at [facility] back in April 2021.  He was in the process of being scheduled for an upper endoscopy and colonoscopy prior to CT findings.  Plan: 1. Continue anticoagulation per hospitalist/hematology 2. Continued PPI twice daily 3. Continue pancreatic enzymes 4. Strict avoidance of all NSAIDs 5. Follow-up as soon as feasible with GI at [facility] for PVT and to complete EGD/colonoscopy 6. Call PCP, GI, or present to ER for any signs of obvious bleeding 7. Supportive measures   Thank you for allowing [place] to participate in the care of Clarene Reamer, DNP, AGNP-C Adult & Gerontological Nurse Practitioner [facility]    LOS: 1 day    09/17/2019, 1:38 PM

## 2019-09-17 NOTE — Discharge Instructions (Signed)
Information on my medicine - ELIQUIS (apixaban)  This medication education was reviewed with me or my healthcare representative as part of my discharge preparation.   Why was Eliquis prescribed for you? Eliquis was prescribed to treat blood clots that may have been found in the veins of your legs (deep vein thrombosis) or in your lungs (pulmonary embolism) and to reduce the risk of them occurring again.  What do You need to know about Eliquis ? The starting dose is 10 mg (two 5 mg tablets) taken TWICE daily for the FIRST SEVEN (7) DAYS, then on (enter date)  09/24/2019  the dose is reduced to ONE 5 mg tablet taken TWICE daily.  Eliquis may be taken with or without food.   Try to take the dose about the same time in the morning and in the evening. If you have difficulty swallowing the tablet whole please discuss with your pharmacist how to take the medication safely.  Take Eliquis exactly as prescribed and DO NOT stop taking Eliquis without talking to the doctor who prescribed the medication.  Stopping may increase your risk of developing a new blood clot.  Refill your prescription before you run out.  After discharge, you should have regular check-up appointments with your healthcare provider that is prescribing your Eliquis.    What do you do if you miss a dose? If a dose of ELIQUIS is not taken at the scheduled time, take it as soon as possible on the same day and twice-daily administration should be resumed. The dose should not be doubled to make up for a missed dose.  Important Safety Information A possible side effect of Eliquis is bleeding. You should call your healthcare provider right away if you experience any of the following: ? Bleeding from an injury or your nose that does not stop. ? Unusual colored urine (red or dark brown) or unusual colored stools (red or black). ? Unusual bruising for unknown reasons. ? A serious fall or if you hit your head (even if there is no  bleeding).  Some medicines may interact with Eliquis and might increase your risk of bleeding or clotting while on Eliquis. To help avoid this, consult your healthcare provider or pharmacist prior to using any new prescription or non-prescription medications, including herbals, vitamins, non-steroidal anti-inflammatory drugs (NSAIDs) and supplements.  This website has more information on Eliquis (apixaban): http://www.eliquis.com/eliquis/home    Bleeding Precautions When on Anticoagulant Therapy, Adult Anticoagulant therapy, also called blood thinner therapy, is medicine that helps to prevent and treat blood clots. The medicine works by stopping blood clots from forming or growing. Blood clots that form in your blood vessels can be dangerous. They can break loose and travel to the heart, lungs, or brain. This increases the risk of a heart attack, stroke, or blocked lung artery (pulmonary embolism). Anticoagulants also increase the risk of bleeding. Try to protect yourself from cuts and other injuries that can cause bleeding. It is important to take anticoagulants exactly as told by your health care provider. Why do I need to be on anticoagulant therapy? You may need this medicine if you are at risk of developing a blood clot. Conditions that increase your risk of a blood clot include:  Being born with heart disease or a heart malformation (congenital heart disease).  Developing heart disease.  Having had surgery, such as valve replacement.  Having had a serious accident or other type of severe injury (trauma).  Having certain types of cancer.  Having certain  diseases that can increase blood clotting.  Having a high risk of stroke or heart attack.  Having atrial fibrillation (AF). What are the common anticoagulant medicines? There are several types of anticoagulant medicines. The most common types are:  Medicines that you take by mouth (oral medicines), such  as: ? Warfarin. ? Novel oral anticoagulants (NOACs), such as:  Direct thrombin inhibitors (dabigatran).  Factor Xa inhibitors (apixaban, edoxaban, and rivaroxaban).  Injections, such as: ? Unfractionated heparin. ? Low molecular weight heparin. These anticoagulants work in different ways to prevent blood clots. They also have different risks and side effects. What do I need to remember while on anticoagulant therapy? Taking anticoagulants  Take your medicine at the same time every day. If you forget to take your medicine, take it as soon as you remember. Do not double your dosage of medicine if you miss a whole day. Take your normal dose and call your health care provider.  Do not stop taking your medicine unless your health care provider approves. Stopping the medicine can increase your risk of developing a blood clot. Taking other medicines  Take over-the-counter and prescriptions medicines only as told by your health care provider.  Do not take over-the-counter NSAIDs, including aspirin and ibuprofen, while you are on anticoagulant therapy. These medicines increase your risk of dangerous bleeding.  Get approval from your health care provider before you start taking any new medicines, vitamins, or herbal products. Some of these could interfere with your therapy. General instructions  Keep all follow-up visits as told by your health care provider. This is important.  If you are pregnant or trying to get pregnant, talk with a health care provider about anticoagulants. Some of these medicines are not safe to take during pregnancy.  Tell all health care providers, including your dentist, that you are on anticoagulant therapy. It is especially important to tell providers before you have any surgery, medical procedures, or dental work done. What precautions should I take?   Be very careful when using knives, scissors, or other sharp objects.  Use an electric razor instead of a  blade.  Do not use toothpicks.  Use a soft-bristled toothbrush. Brush your teeth gently.  Always wear shoes outdoors and wear slippers indoors.  Be careful when cutting your fingernails and toenails.  Place bath mats in the bathroom. If possible, install handrails as well.  Wear gloves while you do yard work.  Wear your seat belt.  Prevent falls by removing loose rugs and extension cords from areas where you walk. Use a cane or walker if you need it.  Avoid constipation by: ? Drinking enough fluid to keep your urine clear or pale yellow. ? Eating foods that are high in fiber, such as fresh fruits and vegetables, whole grains, and beans. ? Limiting foods that are high in fat and processed sugars, such as fried and sweet foods.  Do not play contact sports or participate in other activities that have a high risk for injury. What other precautions are important if on warfarin therapy? If you are taking a type of anticoagulant called warfarin, make sure you:  Work with a diet and nutrition specialist (dietitian) to make an eating plan. Do not make any sudden changes to your diet after you have started your eating plan.  Do not drink alcohol. It can interfere with your medicine and increase your risk of an injury that causes bleeding.  Get regular blood tests as told by your health care provider. What are  some questions to ask my health care provider?  Why do I need anticoagulant therapy?  What is the best anticoagulant therapy for my condition?  How long will I need anticoagulant therapy?  What are the side effects of anticoagulant therapy?  When should I take my medicine? What should I do if I forget to take it?  Will I need to have regular blood tests?  Do I need to change my diet? Are there foods or drinks that I should avoid?  What activities are safe for me?  What should I do if I want to get pregnant? Contact a health care provider if:  You miss a dose of  medicine: ? And you are not sure what to do. ? For more than one day.  You have: ? Menstrual bleeding that is heavier than normal. ? Bloody or brown urine. ? Easy bruising. ? Black and tarry stool or bright red stool. ? Side effects from your medicine.  You feel weak or dizzy.  You become pregnant. Get help right away if:  You have bleeding that will not stop within 20 minutes from: ? The nose. ? The gums. ? A cut on the skin.  You have a severe headache or stomachache.  You vomit or cough up blood.  You fall or hit your head. Summary  Anticoagulant therapy, also called blood thinner therapy, is medicine that helps to prevent and treat blood clots.  Anticoagulants work in different ways to prevent blood clots. They also have different risks and side effects.  Talk with your health care provider about any precautions that you should take while on anticoagulant therapy. This information is not intended to replace advice given to you by your health care provider. Make sure you discuss any questions you have with your health care provider. Document Revised: 05/20/2018 Document Reviewed: 04/16/2016 Elsevier Patient Education  2020 Elsevier Inc.    Portal Vein Thrombosis  Portal vein thrombosis (PVT) is a blockage from a blood clot in the vein that carries blood from the intestines to the liver (portal vein). PVT can also develop in the branches of the portal vein. The clot may form quickly or develop over time. PVT may slow down or completely stop blood flow. PVT is treatable, but it can be life-threatening. What are the causes? PVT is caused by a blood clot. In many cases, the cause of the clot is not known. What increases the risk? You are at risk of PVT if you have any condition that slows down blood flow or increases the clotting of blood. These conditions include:  Scarring of the liver (cirrhosis).  Cancer, especially of the liver or pancreas.  Infections of the  pancreas or gallbladder.  Blood-clotting disorders.  Surgery or injury of the abdomen. What are the signs or symptoms? PVT often does not cause signs or symptoms. In some cases, you may have gastrointestinal (GI) bleeding from a backup of blood flow because of the blockage. This is caused by the veins that have become wide (dilated). Other signs and symptoms of PVT may include:  Pain in the abdomen.  Nausea or vomiting.  Swelling of the abdomen from too much fluid (ascites).  Fever.  Enlarged spleen.  Gastrointestinal (GI) bleeding from swollen blood vessels in the esophagus or stomach. If this happens, you may: ? Vomit blood. ? Have bloody diarrhea. ? Have black, tarry stools. How is this diagnosed? This condition may be diagnosed based on your symptoms and risk factors. Your health  care provider will do:  A physical exam.  Imaging studies of your abdomen. These may include ultrasound, CT scan, or MRI.  Tests to check for liver function or infection. These are also done to confirm the diagnosis. How is this treated? Treatment of PVT depends on the cause and severity of your condition. It may also include treatment for any underlying conditions. This condition may be treated with:  Medicines to: ? Break up a blood clot. ? Prevent clotting (anticoagulants). You may first get anticoagulant injections and then continue with anticoagulant pills for several months. ? Lower your blood pressure. ? Improve blood flow to your liver (octreotide).  Surgery to: ? Stop bleeding in the stomach or esophagus. This procedure is usually done using a scope passed through your mouth (endoscopic surgery). ? Restore blood flow through the blood clot, or around it (shunt surgery).  An organ transplant to replace the damaged liver with a healthy liver from a donor. This is done in very severe cases of liver damage. Follow these instructions at home: Medicines  Take over-the-counter and  prescription medicines only as told by your health care provider.  If you were prescribed an antibiotic medicine, take it as told by your health care provider. Do not stop using the antibiotic even if you start to feel better. Blood Thinners If you are taking blood thinners:  Talk with your health care provider before you take any medicines that contain aspirin or NSAIDs. These medicines increase your risk for dangerous bleeding.  Get approval from your health care provider before you start taking any new medicines, vitamins, or herbal products. Some of these could interfere with your therapy.  Take your medicine exactly as told, at the same time every day.  Avoid activities that could cause injury or bruising, and follow instructions about how to prevent falls.  Wear a medical alert bracelet or carry a card that lists what medicines you take. Your health care provider may ask you to:  Have blood tests done regularly so that your medicines may be changed if needed.  Limit foods that have vitamin K. Vitamin K affects how some blood thinners work in the body. ? Some common foods that contain high amounts of vitamin K include kale, spinach, and broccoli. ? Work with a diet and nutrition specialist (dietitian) to make an eating plan that is right for you. Lifestyle   Eat foods that are high in fiber, such as fresh fruits and vegetables, whole grains, and beans.  Limit foods that are high in fat and processed sugars, such as fried and sweet foods.  Do not use any products that contain nicotine or tobacco, such as cigarettes, e-cigarettes, and chewing tobacco. If you need help quitting, ask your health care provider.  Do not drink alcohol. Alcohol can damage your liver.  Ask your health care provider if you have other fluid or diet restrictions. General instructions  Return to your normal activities as told by your health care provider. Ask your health care provider what activities are  safe for you.  Keep all follow-up visits as told by your health care provider. This is important.  You may be asked to have regular blood tests if you are taking blood thinners. Contact a health care provider if:  You have chills or a fever.  You have any signs or symptoms that get worse or come back.  There is blood in your stool.  You have black, tarry stools. Get help right away if:  You vomit blood.  You have fresh blood or blood clots in your stool. Summary  Portal vein thrombosis (PVT) is a blockage from a blood clot in the vein that carries blood from your intestines to your liver (portal vein). Any condition that slows down blood flow or increases blood clotting can cause PVT.  PVT often does not cause signs or symptoms but may cause gastrointestinal bleeding, which may cause a backup of blood.  Sometimes, PVT can cause swelling in the abdomen from fluid buildup (ascites), pain in the abdomen, and bleeding from the esophagus or stomach.  Treatment of PVT depends on the cause and severity of your condition. Treatment may include medicines and sometimes surgery.  You may be asked to limit your intake of foods that have vitamin K. Vitamin K may affect the way blood thinners work in the body. This information is not intended to replace advice given to you by your health care provider. Make sure you discuss any questions you have with your health care provider. Document Revised: 02/18/2018 Document Reviewed: 02/13/2018 Elsevier Patient Education  2020 Elsevier Inc.    IMPORTANT INFORMATION: PAY CLOSE ATTENTION   PHYSICIAN DISCHARGE INSTRUCTIONS  Follow with Primary care provider  Gareth Morgan, MD  and other consultants as instructed by your Hospitalist Physician  SEEK MEDICAL CARE OR RETURN TO EMERGENCY ROOM IF SYMPTOMS COME BACK, WORSEN OR NEW PROBLEM DEVELOPS   Please note: You were cared for by a hospitalist during your hospital stay. Every effort will be made  to forward records to your primary care provider.  You can request that your primary care provider send for your hospital records if they have not received them.  Once you are discharged, your primary care physician will handle any further medical issues. Please note that NO REFILLS for any discharge medications will be authorized once you are discharged, as it is imperative that you return to your primary care physician (or establish a relationship with a primary care physician if you do not have one) for your post hospital discharge needs so that they can reassess your need for medications and monitor your lab values.  Please get a complete blood count and chemistry panel checked by your Primary MD at your next visit, and again as instructed by your Primary MD.  Get Medicines reviewed and adjusted: Please take all your medications with you for your next visit with your Primary MD  Laboratory/radiological data: Please request your Primary MD to go over all hospital tests and procedure/radiological results at the follow up, please ask your primary care provider to get all Hospital records sent to his/her office.  In some cases, they will be blood work, cultures and biopsy results pending at the time of your discharge. Please request that your primary care provider follow up on these results.  If you are diabetic, please bring your blood sugar readings with you to your follow up appointment with primary care.    Please call and make your follow up appointments as soon as possible.    Also Note the following: If you experience worsening of your admission symptoms, develop shortness of breath, life threatening emergency, suicidal or homicidal thoughts you must seek medical attention immediately by calling 911 or calling your MD immediately  if symptoms less severe.  You must read complete instructions/literature along with all the possible adverse reactions/side effects for all the Medicines you take  and that have been prescribed to you. Take any new Medicines after you have completely  understood and accpet all the possible adverse reactions/side effects.   Do not drive when taking Pain medications or sleeping medications (Benzodiazepines)  Do not take more than prescribed Pain, Sleep and Anxiety Medications. It is not advisable to combine anxiety,sleep and pain medications without talking with your primary care practitioner  Special Instructions: If you have smoked or chewed Tobacco  in the last 2 yrs please stop smoking, stop any regular Alcohol  and or any Recreational drug use.  Wear Seat belts while driving.  Do not drive if taking any narcotic, mind altering or controlled substances or recreational drugs or alcohol.

## 2019-09-17 NOTE — Care Management Important Message (Signed)
Important Message  Patient Details  Name: Brent [name] MRN: [id number] Date of Birth: [birth year]   Medicare Important Message Given:  Yes     Corey Harold 09/17/2019, 2:23 PM

## 2019-09-17 NOTE — Progress Notes (Signed)
IV's removed and discharge instructions reviewed.  Scripts sent to pharmacy and has starter pack for eliquis.  Transported by WC to entrance.  Sister to drive home

## 2019-09-17 NOTE — TOC Transition Note (Signed)
Transition of Care [facility]) - CM/SW Discharge Note   Patient Details  Name: Brent [name] MRN: [id number] Date of Birth: Jul 11, 1970  Transition [facility] Care [facility]) CM/SW Contact:  Leitha Bleak, RN Phone Number: 09/17/2019, 3:46 PM   Clinical Narrative:   Patient admitted with Portal vein thrombosis. TOC consulted for medication assistance. Patient has Medicare, Patient needs Eliquis. Pharmacy is taking coupon and educating on the medicine and the program. Patient to follow up with GI.     Barriers to Discharge: Barriers Resolved   Patient Goals and CMS Choice Patient states their goals for this hospitalization and ongoing recovery are:: to go home.

## 2019-11-10 ENCOUNTER — Other Ambulatory Visit: Payer: Medicare Other

## 2019-11-11 ENCOUNTER — Ambulatory Visit: Payer: Medicare Other

## 2019-12-27 ENCOUNTER — Encounter (HOSPITAL_COMMUNITY)
Admission: RE | Admit: 2019-12-27 | Discharge: 2019-12-27 | Disposition: A | Discharge status: Home / Self Care | Payer: Medicare Other | Source: Ambulatory Visit | Attending: Neurology | Admitting: Neurology

## 2019-12-29 ENCOUNTER — Telehealth (HOSPITAL_COMMUNITY): Payer: Self-pay | Admitting: *Deleted

## 2019-12-29 NOTE — Telephone Encounter (Signed)
Patient was scheduled to have Ocrevus induction on 12/27/19 and was a no show.  Unable to reach him by phone.  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  Ocrevus was ordered by pharmacy to prepare for visit.  Due to the cost of the medication and patients inability to make it to appointments it was requested that the office locate an infusion clinic that may better meet his criteria or enroll him in a drug assistance program.  Mertie Clause verbalized understanding and will attempt to find other options for his infusions.

## 2020-03-23 DIAGNOSIS — K635 Polyp of colon: Secondary | ICD-10-CM | POA: Insufficient documentation

## 2021-01-02 ENCOUNTER — Other Ambulatory Visit: Payer: Self-pay

## 2021-01-02 DIAGNOSIS — E119 Type 2 diabetes mellitus without complications: Secondary | ICD-10-CM | POA: Diagnosis not present

## 2021-01-02 DIAGNOSIS — K9429 Other complications of gastrostomy: Secondary | ICD-10-CM | POA: Insufficient documentation

## 2021-01-02 DIAGNOSIS — F17210 Nicotine dependence, cigarettes, uncomplicated: Secondary | ICD-10-CM | POA: Diagnosis not present

## 2021-01-02 DIAGNOSIS — J449 Chronic obstructive pulmonary disease, unspecified: Secondary | ICD-10-CM | POA: Insufficient documentation

## 2021-01-02 DIAGNOSIS — Y833 Surgical operation with formation of external stoma as the cause of abnormal reaction of the patient, or of later complication, without mention of misadventure at the time of the procedure: Secondary | ICD-10-CM | POA: Insufficient documentation

## 2021-01-02 DIAGNOSIS — I10 Essential (primary) hypertension: Secondary | ICD-10-CM | POA: Diagnosis not present

## 2021-01-02 DIAGNOSIS — Z7951 Long term (current) use of inhaled steroids: Secondary | ICD-10-CM | POA: Diagnosis not present

## 2021-01-02 DIAGNOSIS — Z7901 Long term (current) use of anticoagulants: Secondary | ICD-10-CM | POA: Insufficient documentation

## 2021-01-03 ENCOUNTER — Encounter (HOSPITAL_COMMUNITY): Payer: Self-pay

## 2021-01-03 ENCOUNTER — Emergency Department (HOSPITAL_COMMUNITY)
Admission: EM | Admit: 2021-01-03 | Discharge: 2021-01-03 | Disposition: A | Discharge status: Other Acute Inpatient Hospital | Payer: Medicare Other | Attending: Emergency Medicine | Admitting: Emergency Medicine

## 2021-01-03 ENCOUNTER — Emergency Department (HOSPITAL_COMMUNITY): Payer: Medicare Other

## 2021-01-03 ENCOUNTER — Other Ambulatory Visit: Payer: Self-pay

## 2021-01-03 DIAGNOSIS — Z431 Encounter for attention to gastrostomy: Secondary | ICD-10-CM

## 2021-01-03 DIAGNOSIS — K9429 Other complications of gastrostomy: Secondary | ICD-10-CM | POA: Diagnosis not present

## 2021-01-03 LAB — CBC WITH DIFFERENTIAL/PLATELET
Abs Immature Granulocytes: 0.07 10*3/uL (ref 0.00–0.07)
Basophils Absolute: 0.1 10*3/uL (ref 0.0–0.1)
Basophils Relative: 1 %
Eosinophils Absolute: 0.3 10*3/uL (ref 0.0–0.5)
Eosinophils Relative: 3 %
HCT: 46.9 % (ref 39.0–52.0)
Hemoglobin: 15.7 g/dL (ref 13.0–17.0)
Immature Granulocytes: 1 %
Lymphocytes Relative: 33 %
Lymphs Abs: 3.1 10*3/uL (ref 0.7–4.0)
MCH: 31.6 pg (ref 26.0–34.0)
MCHC: 33.5 g/dL (ref 30.0–36.0)
MCV: 94.4 fL (ref 80.0–100.0)
Monocytes Absolute: 1 10*3/uL (ref 0.1–1.0)
Monocytes Relative: 10 %
Neutro Abs: 4.8 10*3/uL (ref 1.7–7.7)
Neutrophils Relative %: 52 %
Platelets: 266 10*3/uL (ref 150–400)
RBC: 4.97 MIL/uL (ref 4.22–5.81)
RDW: 13.7 % (ref 11.5–15.5)
WBC: 9.3 10*3/uL (ref 4.0–10.5)
nRBC: 0 % (ref 0.0–0.2)

## 2021-01-03 LAB — COMPREHENSIVE METABOLIC PANEL
ALT: 26 U/L (ref 0–44)
AST: 23 U/L (ref 15–41)
Albumin: 3.6 g/dL (ref 3.5–5.0)
Alkaline Phosphatase: 151 U/L — ABNORMAL HIGH (ref 38–126)
Anion gap: 9 (ref 5–15)
BUN: 7 mg/dL (ref 6–20)
CO2: 25 mmol/L (ref 22–32)
Calcium: 8.7 mg/dL — ABNORMAL LOW (ref 8.9–10.3)
Chloride: 100 mmol/L (ref 98–111)
Creatinine, Ser: 0.68 mg/dL (ref 0.61–1.24)
GFR, Estimated: >60 mL/min (ref >60)
Glucose, Bld: 132 mg/dL — ABNORMAL HIGH (ref 70–99)
Potassium: 3.4 mmol/L — ABNORMAL LOW (ref 3.5–5.1)
Sodium: 134 mmol/L — ABNORMAL LOW (ref 135–145)
Total Bilirubin: 0.5 mg/dL (ref 0.3–1.2)
Total Protein: 6.5 g/dL (ref 6.5–8.1)

## 2021-01-03 LAB — LIPASE, BLOOD: Lipase: 26 U/L (ref 11–51)

## 2021-01-03 MED ORDER — SODIUM CHLORIDE 0.9 % IV BOLUS
1000.0000 mL | Freq: Once | INTRAVENOUS | Status: AC
  Administered 2021-01-03: 1000 mL via INTRAVENOUS

## 2021-01-03 MED ORDER — ACETAMINOPHEN 325 MG PO TABS
650.0000 mg | ORAL_TABLET | Freq: Once | ORAL | Status: AC
  Administered 2021-01-03: 650 mg via ORAL
  Filled 2021-01-03: qty 2

## 2021-01-03 MED ORDER — OXYCODONE-ACETAMINOPHEN 5-325 MG PO TABS
1.0000 | ORAL_TABLET | Freq: Once | ORAL | Status: AC
  Administered 2021-01-03: 1 via ORAL
  Filled 2021-01-03: qty 1

## 2021-01-03 MED ORDER — IOHEXOL 300 MG/ML  SOLN
50.0000 mL | Freq: Once | INTRAMUSCULAR | Status: AC | PRN
  Administered 2021-01-03: 30 mL

## 2021-01-03 MED ORDER — LIDOCAINE HCL URETHRAL/MUCOSAL 2 % EX GEL
CUTANEOUS | Status: AC
  Filled 2021-01-03: qty 10

## 2021-01-03 NOTE — ED Provider Notes (Signed)
[facility] EMERGENCY DEPARTMENT Provider Note   CSN: [id number] Arrival date & time: 01/02/21  2316     History Chief Complaint  Patient presents with   G-Tube Problem    Brent [name] is a 50 y.o. male.  Patient with a history of pancreatitis, multiple sclerosis, hypertension, COPD presenting with GJ tube dislodgment.  States tube was pulled out as he was attempted to get into bed tonight.  Has had some brown discharge from the site and intermittent bleeding.  Tube is been in place for 2 years and last placed by interventional radiology at [facility] in June. He states he eats by mouth here and there but if he eats too much he has vomiting which is why he has the tube.  He has some pain at the site.  States the tube was pulled out with the balloon inflated.  Does not have it with him.  No chest pain or shortness of breath.  No fever. No blood thinner use  The history is provided by the patient.      Past Medical History:  Diagnosis Date   Alcohol use    Anxiety    Disabled due to panic attacks   Arthritis    Bilateral ureteral calculi    Borderline type 2 diabetes mellitus    BPH (benign prostatic hypertrophy)    Chronic back pain    Condylomata acuminata in male    multiple procedures --  penile, peri-rectal , perineum   DDD (degenerative disc disease), cervical    Depression    Dyspnea on exertion    Emphysematous COPD (HCC)    Epiretinal membrane (ERM) of both eyes    Fibromyalgia    GERD (gastroesophageal reflux disease)    Headache(784.0)    History of chronic pancreatitis    severeal adx for this /  2008  dx alcoholic pancreatitis   History of esophageal dilatation    History of hiatal hernia    History of kidney stones    History of panic attacks    History of sepsis    adx 05-01-2014--  urosepsis due to kidney stones obstruction/ hydronephrosis   History of suicidal ideation    2006   adx   Hypertension    was on medication for short time, htn was  caused by prednisone per pt   Multiple sclerosis (HCC)    pt. 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   Question of Mesenteric thrombosis 06/09/2019   Abnormal CT of the abdomen    Pancreatitis 02/24/2018   Protein-calorie malnutrition, severe (HCC) 02/24/2018   MS (multiple sclerosis) (HCC) 11/28/2017   Acute pancreatitis 11/27/2017   Loose stools 11/27/2017   Acute on chronic pancreatitis (HCC) 11/27/2017   Pancreatitis, alcoholic, acute 02/18/2016   Acute alcoholic pancreatitis 11/07/2014   Bilateral ureteral calculi    UTI (lower urinary tract infection)    Ureteral calculi 05/02/2014   Ureterolithiasis 05/01/2014   UTI (urinary tract infection) 05/01/2014   Sepsis (HCC) 05/01/2014   Hydronephrosis 05/01/2014   Tobacco abuse 05/01/2014   Hypokalemia 05/01/2014   Failure to thrive (0-17) 01/22/2014   Melena 11/03/2012   Odynophagia 11/03/2012   Esophageal dysphagia 11/03/2012   ETOH abuse 08/31/2011   Chronic back pain  Anxiety    Loss of weight 05/04/2010   Epigastric pain 05/04/2010   Rectal bleeding 05/04/2010   Rectal pain 05/04/2010    Past Surgical History:  Procedure Laterality Date   BIOPSY  11/12/2012   Procedure: GASTRIC AND ESOPHAGEAL BIOPSIES;  Surgeon: Daneil Dolin, MD;  Location: [facility];  Service: Endoscopy;;   CATARACT EXTRACTION W/ INTRAOCULAR LENS  IMPLANT, BILATERAL     COLONOSCOPY  10/08/2011   Jenkins:Normal colon/Anal condyloma without extension proximal to dentate line   CYSTOSCOPY W/ URETERAL STENT PLACEMENT Bilateral 05/01/2014   Procedure: CYSTOSCOPY WITH BILATERAL RETROGRADE PYELOGRAM; BILATERAL URETERAL STENT PLACEMENT;  Surgeon: Rana Snare, MD;  Location: [facility];  Service: Urology;  Laterality: Bilateral;   CYSTOSCOPY W/ URETERAL STENT REMOVAL Bilateral  06/06/2014   Procedure: CYSTOSCOPY WITH STENT REMOVAL;  Surgeon: Franchot Gallo, MD;  Location: [facility];  Service: Urology;  Laterality: Bilateral;   CYSTOSCOPY WITH URETEROSCOPY  06/06/2014   Procedure: CYSTOSCOPY WITH URETEROSCOPY;  Surgeon: Franchot Gallo, MD;  Location: [facility];  Service: Urology;;   CYSTOSCOPY WITH URETEROSCOPY AND STENT PLACEMENT Bilateral 06/06/2014   Procedure: CYSTOSCOPY WITH J2 STENT EXTRACTION,,URETEROSCOPY WITH EXTRACTION OF STONES,;  Surgeon: Franchot Gallo, MD;  Location: [facility];  Service: Urology;  Laterality: Bilateral;   ELECTROCAUTERY/ DESICCATION OF CONDYLOMA LESIONS  01-15-2008  &  12-29-2009   PENIS, PERI-RECTAL , PERINUEM   ESOPHAGOGASTRODUODENOSCOPY (EGD) WITH PROPOFOL N/A 11/12/2012   [id number] esophagus s/p  passage of a Maloney dilator and biopsy. Abnormal gastric mucosa-status post biopsy   FLEXIBLE BRONCHOSCOPY N/A 08/12/2012   Procedure: FLEXIBLE BRONCHOSCOPY;  Surgeon: Alonza Bogus, MD;  Location: [facility];  Service: Pulmonary;  Laterality: N/A;   MALONEY DILATION N/A 11/12/2012   Procedure: MALONEY DILATION (66mm);  Surgeon: Daneil Dolin, MD;  Location: [facility];  Service: Endoscopy;  Laterality: N/A;   MULTIPLE EXTRACTIONS WITH ALVEOLOPLASTY N/A 04/22/2014   Procedure: MULTIPLE EXTRACTIONS ( 2,3,5,6,7,8,9,10,11,13,14,15,21,28  WITH ALVEOLOPLASTY;  Surgeon: Diona Browner, DDS;  Location: [place];  Service: Oral Surgery;  Laterality: N/A;   WISDOM TOOTH EXTRACTION         Family History  Problem Relation Age of Onset   Hypertension Mother    Breast cancer Mother    Melanoma Mother    Stroke Mother    Lung cancer Father 22   Colon cancer Neg Hx    Colitis Neg Hx    Cirrhosis Neg Hx    Liver disease Neg Hx    Pancreatic cancer Neg Hx    Pancreatitis Neg Hx    Anesthesia problems Neg Hx    Hypotension Neg Hx    Malignant hyperthermia Neg Hx    Pseudochol deficiency Neg Hx      Social History   Tobacco Use   Smoking status: Every Day    Packs/day: 1.00    Years: 20.00    Pack years: 20.00    Types: Cigarettes, Cigars   Smokeless tobacco: Never   Tobacco comments:    1 pack per week now as of 02/24/2018   Vaping Use   Vaping Use: Never used  Substance Use Topics   Alcohol use: Not Currently   Drug use: No    Home Medications Prior to Admission medications   Medication Sig Start Date End Date Taking? Authorizing Provider  albuterol (PROVENTIL HFA;VENTOLIN HFA) 108 (90 Base) MCG/ACT inhaler Inhale 1-2 puffs into the lungs every 6 (six) hours as needed for wheezing or shortness of  breath.  07/25/15   [provider]  ALPRAZolam Duanne Moron) 1 MG tablet Take 1 tablet (1 mg total) by mouth 2 (two) times daily as needed for anxiety. 09/17/19   Johnson, Clanford L, MD  APIXABAN Arne Cleveland) VTE STARTER PACK (10MG  AND 5MG ) Take as directed on package: start with two-5mg  tablets twice daily for 7 days. On day 8, switch to one-5mg  tablet twice daily. 09/17/19   Johnson, Clanford L, MD  Calcium Carbonate-Vitamin D (CALCIUM HIGH POTENCY/VITAMIN D) 600-200 MG-UNIT TABS Take 1 tablet by mouth daily.    [provider]  CREON 3000-9500 units CPEP Take 1 capsule by mouth 3 (three) times daily. 05/17/19   [provider]  gabapentin (NEURONTIN) 600 MG tablet Take 600 mg by mouth 4 (four) times daily. 05/21/19   [provider]  HYDROcodone-acetaminophen (NORCO) 10-325 MG tablet Take 1 tablet by mouth every 6 (six) hours as needed for severe pain. For pain 09/17/19   Johnson, Clanford L, MD  ketorolac (ACULAR) 0.5 % ophthalmic solution Place 2 drops into both eyes 4 (four) times daily.    [provider]  natalizumab (TYSABRI) 300 MG/15ML injection Inject 300 mg into the vein every 30 (thirty) days.     [provider]  omeprazole (PRILOSEC) 40 MG capsule Take 40 mg by mouth at bedtime.    [provider]  ondansetron (ZOFRAN) 4  MG tablet Take 4 mg by mouth every 8 (eight) hours as needed for nausea.  05/28/19   [provider]  prednisoLONE acetate (PRED FORTE) 1 % ophthalmic suspension Place 2 drops into both eyes 2 (two) times daily.  01/24/15   [provider]  SYMBICORT 80-4.5 MCG/ACT inhaler Inhale 1 puff into the lungs 2 (two) times daily. 04/23/19   [provider]  zolpidem (AMBIEN) 10 MG tablet Take 10 mg by mouth at bedtime as needed.    [provider]    Allergies    Patient has no known allergies.  Review of Systems   Review of Systems  Constitutional:  Negative for activity change, appetite change and fever.  HENT:  Negative for congestion.   Respiratory:  Negative for cough, chest tightness and shortness of breath.   Cardiovascular:  Negative for chest pain.  Gastrointestinal:  Positive for abdominal pain. Negative for nausea and vomiting.  Genitourinary:  Negative for dysuria and hematuria.  Musculoskeletal:  Negative for arthralgias and myalgias.  Skin:  Negative for rash.  Neurological:  Negative for dizziness, weakness and headaches.   all other systems are negative except as noted in the HPI and PMH.   Physical Exam Updated Vital Signs BP 101/70   Pulse 95   Temp 98.6 F (37 C) (Oral)   Resp 19   Ht 5\' 7"  (1.702 m)   Wt 54.4 kg   SpO2 100%   BMI 18.79 kg/m   Physical Exam Vitals and nursing note reviewed.  Constitutional:      General: He is not in acute distress.    Appearance: He is well-developed.     Comments: Chronically ill appearing  HENT:     Head: Normocephalic and atraumatic.     Mouth/Throat:     Pharynx: No oropharyngeal exudate.  Eyes:     Conjunctiva/sclera: Conjunctivae normal.     Pupils: Pupils are equal, round, and reactive to light.  Neck:     Comments: No meningismus. Cardiovascular:     Rate and Rhythm: Normal rate and regular rhythm.  Heart sounds: Normal heart sounds. No murmur heard. Pulmonary:     Effort:  Pulmonary effort is normal. No respiratory distress.     Breath sounds: Normal breath sounds.  Abdominal:     Palpations: Abdomen is soft.     Tenderness: There is no abdominal tenderness. There is no guarding or rebound.     Comments: G-tube sites with brown discharge.  Mild surrounding erythema.  Abdomen soft  Musculoskeletal:        General: No tenderness. Normal range of motion.     Cervical back: Normal range of motion and neck supple.  Skin:    General: Skin is warm.  Neurological:     Mental Status: He is alert and oriented to person, place, and time.     Cranial Nerves: No cranial nerve deficit.     Motor: No abnormal muscle tone.     Coordination: Coordination normal.     Comments: No ataxia on finger to nose bilaterally. No pronator drift. 5/5 strength throughout. CN 2-12 intact.Equal grip strength. Sensation intact.   Psychiatric:        Behavior: Behavior normal.    ED Results / Procedures / Treatments   Labs (all labs ordered are listed, but only abnormal results are displayed) Labs Reviewed  COMPREHENSIVE METABOLIC PANEL - Abnormal; Notable for the following components:      Result Value   Sodium 134 (*)    Potassium 3.4 (*)    Glucose, Bld 132 (*)    Calcium 8.7 (*)    Alkaline Phosphatase 151 (*)    All other components within normal limits  CBC WITH DIFFERENTIAL/PLATELET  LIPASE, BLOOD  URINALYSIS, ROUTINE W REFLEX MICROSCOPIC    EKG None  Radiology DG ABDOMEN PEG TUBE LOCATION  Result Date: 01/03/2021 CLINICAL DATA:  Peg adjustment/replacement EXAM: ABDOMEN - 1 VIEW COMPARISON:  Abdominal CT 06/09/2019 FINDINGS: Percutaneous gastrostomy tube with bulb at the stomach. Contrast injection opacifies stomach and small bowel without extravasation. No concerning mass effect or gas collection. Clear lung bases. IMPRESSION: Normally located PEG. Electronically Signed   By: Jorje Guild M.D.   On: 01/03/2021 04:27    Procedures Gastrostomy tube  replacement  Date/Time: 01/03/2021 4:01 AM Performed by: Ezequiel Essex, MD Authorized by: Ezequiel Essex, MD  Consent: Verbal consent obtained. Risks and benefits: risks, benefits and alternatives were discussed Consent given by: patient Patient identity confirmed: verbally with patient Time out: Immediately prior to procedure a "time out" was called to verify the correct patient, procedure, equipment, support staff and site/side marked as required. Preparation: Patient was prepped and draped in the usual sterile fashion. Local anesthesia used: no  Anesthesia: Local anesthesia used: no  Sedation: Patient sedated: no  Patient tolerance: patient tolerated the procedure well with no immediate complications Comments: Patient with displaced GJ tube.  It was an 14 [place].  Unable unable to pass 18 or 16 [place] Foley.  Eventually able to place 14 [place] Foley with minimal resistance.     Medications Ordered in ED Medications - No data to display  ED Course  I have reviewed the triage vital signs and the nursing notes.  Pertinent labs & imaging results that were available during my care of the patient were reviewed by me and considered in my medical decision making (see chart for details).    MDM Rules/Calculators/A&P                          Patient  with chronic pancreatitis here with displaced GJ tube that was placed under IR at [facility].  It was accidentally removed and he is having some drainage from the site but no bleeding.  Vitals are stable.  Abdomen is soft. He is no longer on anticoagulation.  Attempted to place Foley catheter to keep site open.  Unable to place 42 French Foley catheter.  Unable to pass 16 but was eventually able to pass 14 [place] Foley.  And found to be in appropriate position on x-ray.  Discussed with acute care surgery at [facility] Dr. Burnadette Pop.  They will accept patient to the ED for transfer to IR for placement of GJ tube.   Final  Clinical Impression(s) / ED Diagnoses Final diagnoses:  PEG (percutaneous endoscopic gastrostomy) adjustment/replacement/removal [facility])    Rx / Goodell Orders ED Discharge Orders     None        Anagabriela Jokerst, Annie Main, MD 01/03/21 [phone]

## 2021-01-03 NOTE — ED Notes (Signed)
AC notified to please bring 51F and 82F foley catheters to bedside.

## 2021-01-03 NOTE — ED Triage Notes (Signed)
Pt states his G-Tube got caught on something and was pulled out around 2100. Site is has some brown drainage, minimal blood noted.

## 2021-01-03 NOTE — ED Notes (Signed)
Dressing in place, drainage controlled at this time

## 2021-02-28 ENCOUNTER — Other Ambulatory Visit (HOSPITAL_COMMUNITY): Payer: Self-pay | Admitting: Nurse Practitioner

## 2021-02-28 DIAGNOSIS — R053 Chronic cough: Secondary | ICD-10-CM

## 2021-10-10 ENCOUNTER — Other Ambulatory Visit: Payer: Self-pay | Admitting: Nurse Practitioner

## 2021-10-10 ENCOUNTER — Other Ambulatory Visit (HOSPITAL_COMMUNITY): Payer: Self-pay | Admitting: Nurse Practitioner

## 2021-10-10 DIAGNOSIS — R109 Unspecified abdominal pain: Secondary | ICD-10-CM

## 2021-12-04 NOTE — Progress Notes (Unsigned)
[facility]  PATIENT: Brent [name] DOB: September 28, 1970  REFERRING DOCTOR OR PCP: Dillard Essex, NP; John Giovanni, MD SOURCE: Patient, notes from primary care, notes from [facility] and [facility] neurology, imaging and lab reports, MRI images available were personally reviewed, additional studies will be reviewed if we are able to obtain the images  _________________________________   HISTORICAL  CHIEF COMPLAINT:  Chief Complaint  Patient presents with   New Patient (Initial Visit)    Pt alone, rm 2. Presents today. He was diagnosed 2014 with MS. The patient states that no one ever explained it to him. He states he was told by one MD he had it and one told him he didn't but was going to treat as if it was. Last DMT that was offered was Tysabri and he states last infusion was 6/7 mths ago. Vision is a issue over the last 3/4 mths he noticed worsening. He feels shakiness on inside of body. C/o of brain fog. He denies weakness and numbness/tingling. CT head and neck last c/o2021.    Other    Last MRI 09/2019 in care everywhere. He states the monthly infusions were hard for him to do because he wasn't always able to get a ride. He states the lumbar puncture was done but that they have been unable to locate the records. He complains of MS hugs. He states it happens 2-3 times a month. Has increased in last 6 mths    HISTORY OF PRESENT ILLNESS:  I had the pleasure of seeing your patient, Brent [name], at the [facility] at [facility].  He is a 51 year old man who was diagnosed with MS in 2014 due to visual loss in both eyes.  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  He then noted bilateral floaters in his vision that affected his ability to read.  Visual acuity worsened but he did not have any pain.  There is no diplopia.  He is sore ophthalmology at [facility] and was diagnosed with retinal vasculitis and  cataracts.. He reports xrays showed he had a mass in his chest and he had a bronchoscopy.  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  He was placed on antibiotics and the mass improved.   Visual symptoms persisted and he was referred to [facility] neurology and then [facility] neurology (saw Dr. Elwyn Reach). MRI 06/30/2012 showed a few scattered white matter hyperintense foci that were felt to be nonspecific by radiology..  Visual evoked potentials 08/07/2013 at [facility] showed bilateral moderate P100 latency prolongation and he was diagnosed with MS.  At some point in 2014 or maybe 2015, he saw Dr. Gerilyn Pilgrim and had a lumbar puncture and he reports being told that it was not diagnostic.  He states in 2014 and 2015 he was placed on steroids a couple times because of the visual changed.    He also had cataract surgery.    He was briefly on Tecfidera in 2014 but had side effects and stopped He was on Tysabri off/on since August 2018 with last dose March 2023.  He had a large hiatus after he had pancreatitis in 2019.   Multiple brain MRI, last in 2021, have been stable.  He used to drink heavily but stopped 2020 after pancreatitis.     He has had chronic malnutrition and was on PEG tube feeds in 2020 through early  2023.  At the time the symptoms started in 2014 he feels he was not eating well but he had not been diagnosed with malnutrition.  He is now eating better and has gained 6 pounds this year.    Currently, his main problem continues to be vision.  He has poor visual acuity (20/100 OD and 20/200 OS today.  He felt vision was worse in 2013/2014 and improved some in 2022 but is worsening again.  He last saw ophthalmology in 2022.   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   Ophthalmology exam in 2015 at [facility] reported possible retinal vasculitis and he was placed on steroids again before his cataract surgery.     He notes gait  is minimally on balance and he holds the banister on stairs.  He feels he could probably walk a mile if he had to.  He denies actual numbness but notes a tremulous sensation.  He also has dysesthesias in the chest.    Vascular risk factors: Tobacco use  Imaging: By report, MRI of the cervical spine 10/06/2019 showed a normal spinal cord and a disc osteophyte complex towards the left at C5-C6 causing severe left foraminal narrowing and degenerative changes at C6-C7 that cause foraminal narrowing.  By report, MRI of the brain 10/06/2019 showed multiple T2/FLAIR hyperintense foci predominantly in the juxtacortical and periventricular white matter.  There were no acute findings.  Stable compared to the 2020 MRI  MRI of the brain 01/30/2004 was personally reviewed and was normal.  MRI of the cervical spine 08/14/2006 shows a normal spinal cord and degenerative changes at C5-C6 and C6-C7 which could affect the left C6 nerve root  REVIEW OF SYSTEMS: Constitutional: No fevers, chills, sweats, or change in appetite Eyes: No visual changes, double vision, eye pain Ear, nose and throat: No hearing loss, ear pain, nasal congestion, sore throat Cardiovascular: No chest pain, palpitations Respiratory:  No shortness of breath at rest or with exertion.   No wheezes GastrointestinaI: No nausea, vomiting, diarrhea, abdominal pain, fecal incontinence Genitourinary:  No dysuria, urinary retention or frequency.  No nocturia. Musculoskeletal:  No neck pain, back pain Integumentary: No rash, pruritus, skin lesions Neurological: as above Psychiatric: No depression at this time.  No anxiety Endocrine: No palpitations, diaphoresis, change in appetite, change in weigh or increased thirst Hematologic/Lymphatic:  No anemia, purpura, petechiae. Allergic/Immunologic: No itchy/runny eyes, nasal congestion, recent allergic reactions, rashes  ALLERGIES: No Known Allergies  HOME MEDICATIONS:  Current Outpatient  Medications:    albuterol (PROVENTIL HFA;VENTOLIN HFA) 108 (90 Base) MCG/ACT inhaler, Inhale 1-2 puffs into the lungs every 6 (six) hours as needed for wheezing or shortness of breath. , Disp: , Rfl:    ALPRAZolam (XANAX) 1 MG tablet, Take 1 tablet (1 mg total) by mouth 2 (two) times daily as needed for anxiety. (Patient taking differently: Take 1 mg by mouth 4 (four) times daily as needed for anxiety.), Disp: 30 tablet, Rfl: 0   Calcium Carbonate-Vitamin D 600-200 MG-UNIT TABS, Take 1 tablet by mouth daily., Disp: , Rfl:    cetirizine (ZYRTEC) 10 MG tablet, Take 10 mg by mouth daily., Disp: , Rfl:    Cholecalciferol (VITAMIN D HIGH POTENCY) 25 MCG (1000 UT) capsule, Take 1,000 Units by mouth daily., Disp: , Rfl:    CREON 3000-9500 units CPEP, Take 1 capsule by mouth 3 (three) times daily with meals., Disp: , Rfl:    cyanocobalamin (VITAMIN B12) 1000 MCG  tablet, Take 2,000 mcg by mouth daily., Disp: , Rfl:    docusate sodium (COLACE) 100 MG capsule, Take 100 mg by mouth 2 (two) times daily., Disp: , Rfl:    gabapentin (NEURONTIN) 300 MG capsule, Take 300 mg by mouth 4 (four) times daily., Disp: , Rfl:    HYDROcodone-acetaminophen (NORCO) 10-325 MG tablet, Take 1 tablet by mouth every 6 (six) hours as needed for severe pain. For pain, Disp: 30 tablet, Rfl: 0   ketorolac (ACULAR) 0.5 % ophthalmic solution, Place 2 drops into both eyes 4 (four) times daily., Disp: , Rfl:    omeprazole (PRILOSEC) 40 MG capsule, Take 40 mg by mouth at bedtime., Disp: , Rfl:    ondansetron (ZOFRAN) 4 MG tablet, Take 4 mg by mouth every 8 (eight) hours as needed for nausea. , Disp: , Rfl:    prednisoLONE acetate (PRED FORTE) 1 % ophthalmic suspension, Place 2 drops into both eyes 2 (two) times daily. , Disp: , Rfl:    SYMBICORT 80-4.5 MCG/ACT inhaler, Inhale 1 puff into the lungs 2 (two) times daily., Disp: , Rfl:    zolpidem (AMBIEN) 10 MG tablet, Take 10 mg by mouth at bedtime as needed., Disp: , Rfl:   PAST MEDICAL  HISTORY: Past Medical History:  Diagnosis Date   Alcohol use    Anxiety    Disabled due to panic attacks   Arthritis    Bilateral ureteral calculi    Borderline type 2 diabetes mellitus    BPH (benign prostatic hypertrophy)    Chronic back pain    Condylomata acuminata in male    multiple procedures --  penile, peri-rectal , perineum   DDD (degenerative disc disease), cervical    Depression    Dyspnea on exertion    Emphysematous COPD (HCC)    Epiretinal membrane (ERM) of both eyes    Fibromyalgia    GERD (gastroesophageal reflux disease)    Headache(784.0)    History of chronic pancreatitis    severeal adx for this /  2008  dx alcoholic pancreatitis   History of esophageal dilatation    History of hiatal hernia    History of kidney stones    History of panic attacks    History of sepsis    adx 05-01-2014--  urosepsis due to kidney stones obstruction/ hydronephrosis   History of suicidal ideation    2006   adx   Hypertension    was on medication for short time, htn was caused by prednisone per pt   Multiple sclerosis (HCC)    pt. states has 4 small brain lesions   Nephrolithiasis    bilateral    Retinal vasculitis    Uveitis     PAST SURGICAL HISTORY: Past Surgical History:  Procedure Laterality Date   BIOPSY  11/12/2012   Procedure: GASTRIC AND ESOPHAGEAL BIOPSIES;  Surgeon: Corbin Ade, MD;  Location: [facility];  Service: Endoscopy;;   CATARACT EXTRACTION W/ INTRAOCULAR LENS  IMPLANT, BILATERAL     COLONOSCOPY  10/08/2011   Jenkins:Normal colon/Anal condyloma without extension proximal to dentate line   CYSTOSCOPY W/ URETERAL STENT PLACEMENT Bilateral 05/01/2014   Procedure: CYSTOSCOPY WITH BILATERAL RETROGRADE PYELOGRAM; BILATERAL URETERAL STENT PLACEMENT;  Surgeon: Barron Alvine, MD;  Location: [facility];  Service: Urology;  Laterality: Bilateral;   CYSTOSCOPY W/ URETERAL STENT REMOVAL Bilateral 06/06/2014   Procedure: CYSTOSCOPY WITH STENT REMOVAL;  Surgeon: Marcine Matar, MD;  Location: [facility];  Service: Urology;  Laterality: Bilateral;  CYSTOSCOPY WITH URETEROSCOPY  06/06/2014   Procedure: CYSTOSCOPY WITH URETEROSCOPY;  Surgeon: Marcine Matar, MD;  Location: [facility];  Service: Urology;;   CYSTOSCOPY WITH URETEROSCOPY AND STENT PLACEMENT Bilateral 06/06/2014   Procedure: CYSTOSCOPY WITH J2 STENT EXTRACTION,,URETEROSCOPY WITH EXTRACTION OF STONES,;  Surgeon: Marcine Matar, MD;  Location: [facility];  Service: Urology;  Laterality: Bilateral;   ELECTROCAUTERY/ DESICCATION OF CONDYLOMA LESIONS  01-15-2008  &  12-29-2009   PENIS, PERI-RECTAL , PERINUEM   ESOPHAGOGASTRODUODENOSCOPY (EGD) WITH PROPOFOL N/A 11/12/2012   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 Abnormal gastric mucosa-status post biopsy   FLEXIBLE BRONCHOSCOPY N/A 08/12/2012   Procedure: FLEXIBLE BRONCHOSCOPY;  Surgeon: Fredirick Maudlin, MD;  Location: [facility];  Service: Pulmonary;  Laterality: N/A;   MALONEY DILATION N/A 11/12/2012   Procedure: MALONEY DILATION (54mm);  Surgeon: Corbin Ade, MD;  Location: [facility];  Service: Endoscopy;  Laterality: N/A;   MULTIPLE EXTRACTIONS WITH ALVEOLOPLASTY N/A 04/22/2014   Procedure: MULTIPLE EXTRACTIONS ( 2,3,5,6,7,8,9,10,11,13,14,15,21,28  WITH ALVEOLOPLASTY;  Surgeon: Ocie Doyne, DDS;  Location: [facility];  Service: Oral Surgery;  Laterality: N/A;   WISDOM TOOTH EXTRACTION      FAMILY HISTORY: Family History  Problem Relation Age of Onset   Hypertension Mother    Breast cancer Mother    Melanoma Mother    Stroke Mother    Lung cancer Father 1   Colon cancer Neg Hx    Colitis Neg Hx    Cirrhosis Neg Hx    Liver disease Neg Hx    Pancreatic cancer Neg Hx    Pancreatitis Neg Hx    Anesthesia problems Neg Hx    Hypotension Neg Hx    Malignant hyperthermia Neg Hx    Pseudochol deficiency Neg Hx     SOCIAL HISTORY: Social History   Socioeconomic History    Marital status: Single    Spouse name: Not on file   Number of children: 0   Years of education: Not on file   Highest education level: Not on file  Occupational History   Occupation: disabled    Comment: anxiety  Tobacco Use   Smoking status: Every Day    Packs/day: 1.00    Years: 20.00    Total pack years: 20.00    Types: Cigarettes, Cigars   Smokeless tobacco: Never   Tobacco comments:    1 pack per week now as of 02/24/2018   Vaping Use   Vaping Use: Never used  Substance and Sexual Activity   Alcohol use: Not Currently   Drug use: No   Sexual activity: Not on file  Other Topics Concern   Not on file  Social History Narrative   ** Merged History Encounter **       Social Determinants of Health   Financial Resource Strain: Low Risk  (02/24/2018)   Overall Financial Resource Strain (CARDIA)    Difficulty of Paying Living Expenses: Not very hard  Food Insecurity: No Food Insecurity (02/24/2018)   Hunger Vital Sign    Worried About Running Out of Food in the Last Year: Never true    Ran Out of Food in the Last Year: Never true  Transportation Needs: No Transportation Needs (02/24/2018)   PRAPARE - Administrator, Civil Service (Medical): No    Lack of Transportation (Non-Medical): No  Physical Activity: Not on file  Stress: Not on file  Social Connections: Not on file  Intimate Partner  Violence: Not At Risk (02/24/2018)   Humiliation, Afraid, Rape, and Kick questionnaire    Fear of Current or Ex-Partner: No    Emotionally Abused: No    Physically Abused: No    Sexually Abused: No       PHYSICAL EXAM  Vitals:   12/05/21 0846  BP: 103/73  Pulse: 71  Weight: 116 lb (52.6 kg)  Height:  (1.702 m)    Body mass index is 18.17 kg/m.   OS: 20/200 OD:  20/100 OU:  20/100   General: The patient is well-developed and well-nourished and in no acute distress  HEENT:  Head is [place]/AT.  Sclera are anicteric.  Funduscopic exam shows mild OS optic  nerve pallor.  Normal retinal vessels.  Neck: No carotid bruits are noted.  The neck is nontender.  Cardiovascular: The heart has a regular rate and rhythm with a normal S1 and S2. There were no murmurs, gallops or rubs.    Skin: Extremities are without rash or  edema.  Musculoskeletal:  Back is nontender  Neurologic Exam  Mental status: The patient is alert and oriented x 3 at the time of the examination. The patient has apparent normal recent and remote memory, with an apparently normal attention span and concentration ability.   Speech is normal.  Cranial nerves: Extraocular movements are full. Pupils are equal, round, and reactive to light and accomodation.  Visual fields are full.  Facial symmetry is present. There is good facial sensation to soft touch bilaterally.Facial strength is normal.  Trapezius and sternocleidomastoid strength is normal. No dysarthria is noted.  The tongue is midline, and the patient has symmetric elevation of the soft palate. No obvious hearing deficits are noted.  Motor:  Muscle bulk is normal.   Tone is normal. Strength is  5 / 5 in all 4 extremities.   Sensory: Sensory testing is intact to pinprick, soft touch and vibration sensation in all 4 extremities.  Coordination: Cerebellar testing reveals good finger-nose-finger and heel-to-shin bilaterally.  Gait and station: Station is normal.   Gait is normal. Tandem gait is mildly wide. Romberg is negative.   Reflexes: Deep tendon reflexes are increased arms/legs with spread at knees but no ankle clonus.   Plantar responses are flexor.    DIAGNOSTIC DATA (LABS, IMAGING, TESTING) - I reviewed patient records, labs, notes, testing and imaging myself where available.  Lab Results  Component Value Date   WBC 9.3 01/03/2021   HGB 15.7 01/03/2021   HCT 46.9 01/03/2021   MCV 94.4 01/03/2021   PLT 266 01/03/2021      Component Value Date/Time   NA 134 (L) 01/03/2021 0240   K 3.4 (L) 01/03/2021 0240   CL  100 01/03/2021 0240   CO2 25 01/03/2021 0240   GLUCOSE 132 (H) 01/03/2021 0240   BUN 7 01/03/2021 0240   CREATININE 0.68 01/03/2021 0240   CREATININE 0.74 05/09/2010 1517   CALCIUM 8.7 (L) 01/03/2021 0240   PROT 6.5 01/03/2021 0240   ALBUMIN 3.6 01/03/2021 0240   ALBUMIN 4.2 05/09/2010 1517   AST 23 01/03/2021 0240   AST 14 05/09/2010 1517   ALT 26 01/03/2021 0240   ALKPHOS 151 (H) 01/03/2021 0240   ALKPHOS 69 05/09/2010 1517   BILITOT 0.5 01/03/2021 0240   GFRNONAA >60 01/03/2021 0240   GFRAA >60 09/17/2019 0635   Lab Results  Component Value Date   CHOL 93 11/27/2017   HDL 29 (L) 11/27/2017   LDLCALC 36 11/27/2017  TRIG 94 02/25/2018   CHOLHDL 3.2 11/27/2017   Lab Results  Component Value Date   HGBA1C 5.2 06/09/2019   No results found for: "VITAMINB12" Lab Results  Component Value Date   TSH 0.770 02/19/2016       ASSESSMENT AND PLAN  MS (multiple sclerosis) ([place]) - Plan: MR BRAIN W WO CONTRAST, MR CERVICAL SPINE WO CONTRAST, MR THORACIC SPINE WO CONTRAST  Malnutrition, unspecified type ([place]) - Plan: Vitamin B1, Vitamin B12, CBC with Differential/Platelet, Comprehensive metabolic panel, TSH, VITAMIN D 25 Hydroxy (Vit-D Deficiency, Fractures)  Visual loss - Plan: Vitamin B1, Vitamin B12, Sedimentation rate, C-reactive protein, TSH, VITAMIN D 25 Hydroxy (Vit-D Deficiency, Fractures)  Retinal vasculitis, unspecified laterality  Dysesthesia - Plan: MR BRAIN W WO CONTRAST, MR CERVICAL SPINE WO CONTRAST, MR THORACIC SPINE WO CONTRAST   In summary, Brent [name] is a 51 year old man who presented with bilateral visual loss in 2013 and was diagnosed with multiple sclerosis.  He was apparently also diagnosed with retinal vasculitis at that time.  The MRI of the brain, by report, showed nonspecific foci and we are trying to get more recent MRIs to personally review the images.  The MRI in 2005 was normal.  We also try to get the results of the CSF.  Given his  presentation and the radiology report, I am not certain he has MS and we will hold off on any disease modifying therapy unless I become more certain.  Hopefully we will be able to get more recent MRI images to review as well as the CSF results.  It is possible that the visual loss could also have been due to thiamine deficiency or other issue related to malnutrition as this has been a problem off and on.  Additionally, retinal vasculitis and MRI abnormalities can be seen with Susac's disease.  His last MRI was 3 years ago.  We will check an MRI of the brain, cervical spine and thoracic spine to help determine if he has MS and whether there has been changes over time that would require a more aggressive therapy. Because he is reporting that vision is a little worse now than it was last year (though better than it was in 2013/2014), we will check for autoimmune and metabolic etiology. At this point, he reports that the dysesthesias in the trunk are mild to moderate.  If they worsen we would need to consider treatment with gabapentin, lamotrigine or other agent. He will return to see [place] in 4 months or sooner if there are new or worsening neurologic symptoms or based on the results of the studies.  Thank you for asking me to see Brent [name].  Please let me know if I can be of further assistance with him or other patients in the future.  Deaira Leckey A. Felecia Shelling, MD, [facility] 74/25/9563, 8:75 AM Certified in Neurology, Clinical Neurophysiology, Sleep Medicine and Neuroimaging  [facility] [address] [place], [place] [address] [phone]

## 2021-12-05 ENCOUNTER — Ambulatory Visit (INDEPENDENT_AMBULATORY_CARE_PROVIDER_SITE_OTHER): Payer: Medicare HMO | Admitting: Neurology

## 2021-12-05 ENCOUNTER — Encounter: Payer: Self-pay | Admitting: Neurology

## 2021-12-05 VITALS — BP 103/73 | HR 71 | Ht 67.0 in | Wt 116.0 lb

## 2021-12-05 DIAGNOSIS — G35 Multiple sclerosis: Secondary | ICD-10-CM

## 2021-12-05 DIAGNOSIS — E46 Unspecified protein-calorie malnutrition: Secondary | ICD-10-CM | POA: Diagnosis not present

## 2021-12-05 DIAGNOSIS — H547 Unspecified visual loss: Secondary | ICD-10-CM

## 2021-12-05 DIAGNOSIS — H35069 Retinal vasculitis, unspecified eye: Secondary | ICD-10-CM

## 2021-12-05 DIAGNOSIS — R208 Other disturbances of skin sensation: Secondary | ICD-10-CM

## 2021-12-06 ENCOUNTER — Telehealth: Payer: Self-pay | Admitting: Neurology

## 2021-12-06 NOTE — Telephone Encounter (Signed)
Mcarthur Rossetti Josem Kaufmann: [phone] exp. 12/06/21-01/05/22 [place] medicaid no auth required sent to [place]

## 2021-12-14 LAB — COMPREHENSIVE METABOLIC PANEL
ALT: 14 IU/L (ref 0–44)
AST: 16 IU/L (ref 0–40)
Albumin/Globulin Ratio: 1.7 (ref 1.2–2.2)
Albumin: 4 g/dL (ref 3.8–4.9)
Alkaline Phosphatase: 167 IU/L — ABNORMAL HIGH (ref 44–121)
BUN/Creatinine Ratio: 7 — ABNORMAL LOW (ref 9–20)
BUN: 5 mg/dL — ABNORMAL LOW (ref 6–24)
Bilirubin Total: 0.3 mg/dL (ref 0.0–1.2)
CO2: 22 mmol/L (ref 20–29)
Calcium: 9.2 mg/dL (ref 8.7–10.2)
Chloride: 99 mmol/L (ref 96–106)
Creatinine, Ser: 0.75 mg/dL — ABNORMAL LOW (ref 0.76–1.27)
Globulin, Total: 2.4 g/dL (ref 1.5–4.5)
Glucose: 81 mg/dL (ref 70–99)
Potassium: 4.1 mmol/L (ref 3.5–5.2)
Sodium: 135 mmol/L (ref 134–144)
Total Protein: 6.4 g/dL (ref 6.0–8.5)
eGFR: 108 mL/min/{1.73_m2} (ref >59)

## 2021-12-14 LAB — C-REACTIVE PROTEIN: CRP: 9 mg/L (ref 0–10)

## 2021-12-14 LAB — CBC WITH DIFFERENTIAL/PLATELET
Basophils Absolute: 0.1 10*3/uL (ref 0.0–0.2)
Basos: 1 %
EOS (ABSOLUTE): 0.2 10*3/uL (ref 0.0–0.4)
Eos: 2 %
Hematocrit: 47.1 % (ref 37.5–51.0)
Hemoglobin: 16.4 g/dL (ref 13.0–17.7)
Immature Grans (Abs): 0 10*3/uL (ref 0.0–0.1)
Immature Granulocytes: 0 %
Lymphocytes Absolute: 1.9 10*3/uL (ref 0.7–3.1)
Lymphs: 18 %
MCH: 32.3 pg (ref 26.6–33.0)
MCHC: 34.8 g/dL (ref 31.5–35.7)
MCV: 93 fL (ref 79–97)
Monocytes Absolute: 0.9 10*3/uL (ref 0.1–0.9)
Monocytes: 8 %
Neutrophils Absolute: 7.5 10*3/uL — ABNORMAL HIGH (ref 1.4–7.0)
Neutrophils: 71 %
Platelets: 294 10*3/uL (ref 150–450)
RBC: 5.07 x10E6/uL (ref 4.14–5.80)
RDW: 12.7 % (ref 11.6–15.4)
WBC: 10.6 10*3/uL (ref 3.4–10.8)

## 2021-12-14 LAB — VITAMIN D 25 HYDROXY (VIT D DEFICIENCY, FRACTURES): Vit D, 25-Hydroxy: 74.3 ng/mL (ref 30.0–100.0)

## 2021-12-14 LAB — VITAMIN B12: Vitamin B-12: 1274 pg/mL — ABNORMAL HIGH (ref 232–1245)

## 2021-12-14 LAB — VITAMIN B1: Thiamine: 126.1 nmol/L (ref 66.5–200.0)

## 2021-12-14 LAB — TSH: TSH: 2.56 u[IU]/mL (ref 0.450–4.500)

## 2021-12-14 LAB — SEDIMENTATION RATE: Sed Rate: 11 mm/hr (ref 0–30)

## 2022-01-01 ENCOUNTER — Ambulatory Visit (HOSPITAL_COMMUNITY): Payer: Medicare HMO

## 2022-01-09 ENCOUNTER — Ambulatory Visit (HOSPITAL_COMMUNITY): Payer: Medicare HMO

## 2022-01-14 NOTE — Telephone Encounter (Signed)
Francine Graven Berkley Harvey: [phone] exp. 01/14/22-02/13/22. Scheduled at [facility] for 01/17/22.

## 2022-01-17 ENCOUNTER — Ambulatory Visit (HOSPITAL_COMMUNITY): Payer: Medicare HMO

## 2022-02-13 ENCOUNTER — Ambulatory Visit (HOSPITAL_COMMUNITY): Payer: 59 | Attending: Neurology

## 2022-02-13 ENCOUNTER — Ambulatory Visit (HOSPITAL_COMMUNITY): Payer: 59

## 2022-02-13 ENCOUNTER — Ambulatory Visit (HOSPITAL_COMMUNITY): Admission: RE | Admit: 2022-02-13 | Payer: 59 | Source: Ambulatory Visit

## 2022-02-17 ENCOUNTER — Other Ambulatory Visit: Payer: Self-pay

## 2022-02-17 ENCOUNTER — Emergency Department (HOSPITAL_COMMUNITY): Payer: 59

## 2022-02-17 ENCOUNTER — Inpatient Hospital Stay (HOSPITAL_COMMUNITY)
Admission: EM | Admit: 2022-02-17 | Discharge: 2022-02-21 | DRG: 440 | Discharge status: Against Medical Advice | Payer: 59 | Attending: Internal Medicine | Admitting: Internal Medicine

## 2022-02-17 ENCOUNTER — Observation Stay (HOSPITAL_COMMUNITY): Payer: 59

## 2022-02-17 ENCOUNTER — Encounter (HOSPITAL_COMMUNITY): Payer: Self-pay

## 2022-02-17 DIAGNOSIS — F17210 Nicotine dependence, cigarettes, uncomplicated: Secondary | ICD-10-CM | POA: Diagnosis present

## 2022-02-17 DIAGNOSIS — K861 Other chronic pancreatitis: Secondary | ICD-10-CM

## 2022-02-17 DIAGNOSIS — G35 Multiple sclerosis: Secondary | ICD-10-CM | POA: Diagnosis present

## 2022-02-17 DIAGNOSIS — F419 Anxiety disorder, unspecified: Secondary | ICD-10-CM | POA: Diagnosis not present

## 2022-02-17 DIAGNOSIS — G8929 Other chronic pain: Secondary | ICD-10-CM | POA: Diagnosis present

## 2022-02-17 DIAGNOSIS — Z8249 Family history of ischemic heart disease and other diseases of the circulatory system: Secondary | ICD-10-CM

## 2022-02-17 DIAGNOSIS — I10 Essential (primary) hypertension: Secondary | ICD-10-CM | POA: Diagnosis present

## 2022-02-17 DIAGNOSIS — R7303 Prediabetes: Secondary | ICD-10-CM | POA: Diagnosis present

## 2022-02-17 DIAGNOSIS — G35D Multiple sclerosis, unspecified: Secondary | ICD-10-CM | POA: Diagnosis present

## 2022-02-17 DIAGNOSIS — Z720 Tobacco use: Secondary | ICD-10-CM | POA: Diagnosis not present

## 2022-02-17 DIAGNOSIS — Z801 Family history of malignant neoplasm of trachea, bronchus and lung: Secondary | ICD-10-CM

## 2022-02-17 DIAGNOSIS — Z808 Family history of malignant neoplasm of other organs or systems: Secondary | ICD-10-CM

## 2022-02-17 DIAGNOSIS — M797 Fibromyalgia: Secondary | ICD-10-CM | POA: Diagnosis present

## 2022-02-17 DIAGNOSIS — K859 Acute pancreatitis without necrosis or infection, unspecified: Principal | ICD-10-CM | POA: Diagnosis present

## 2022-02-17 DIAGNOSIS — Z87442 Personal history of urinary calculi: Secondary | ICD-10-CM

## 2022-02-17 DIAGNOSIS — D72829 Elevated white blood cell count, unspecified: Secondary | ICD-10-CM | POA: Diagnosis present

## 2022-02-17 DIAGNOSIS — E876 Hypokalemia: Secondary | ICD-10-CM | POA: Diagnosis present

## 2022-02-17 DIAGNOSIS — Z5329 Procedure and treatment not carried out because of patient's decision for other reasons: Secondary | ICD-10-CM | POA: Diagnosis not present

## 2022-02-17 DIAGNOSIS — E162 Hypoglycemia, unspecified: Secondary | ICD-10-CM | POA: Diagnosis not present

## 2022-02-17 DIAGNOSIS — Z803 Family history of malignant neoplasm of breast: Secondary | ICD-10-CM

## 2022-02-17 DIAGNOSIS — F1011 Alcohol abuse, in remission: Secondary | ICD-10-CM | POA: Diagnosis present

## 2022-02-17 DIAGNOSIS — K860 Alcohol-induced chronic pancreatitis: Secondary | ICD-10-CM | POA: Diagnosis present

## 2022-02-17 DIAGNOSIS — Z823 Family history of stroke: Secondary | ICD-10-CM

## 2022-02-17 DIAGNOSIS — K219 Gastro-esophageal reflux disease without esophagitis: Secondary | ICD-10-CM | POA: Diagnosis present

## 2022-02-17 DIAGNOSIS — Z7951 Long term (current) use of inhaled steroids: Secondary | ICD-10-CM

## 2022-02-17 DIAGNOSIS — F32A Depression, unspecified: Secondary | ICD-10-CM | POA: Diagnosis present

## 2022-02-17 DIAGNOSIS — N400 Enlarged prostate without lower urinary tract symptoms: Secondary | ICD-10-CM | POA: Diagnosis present

## 2022-02-17 DIAGNOSIS — R509 Fever, unspecified: Secondary | ICD-10-CM

## 2022-02-17 DIAGNOSIS — Z79899 Other long term (current) drug therapy: Secondary | ICD-10-CM

## 2022-02-17 DIAGNOSIS — J439 Emphysema, unspecified: Secondary | ICD-10-CM | POA: Diagnosis present

## 2022-02-17 LAB — LIPASE, BLOOD: Lipase: 199 U/L — ABNORMAL HIGH (ref 11–51)

## 2022-02-17 LAB — CBC WITH DIFFERENTIAL/PLATELET
Abs Immature Granulocytes: 0.07 10*3/uL (ref 0.00–0.07)
Basophils Absolute: 0.1 10*3/uL (ref 0.0–0.1)
Basophils Relative: 1 %
Eosinophils Absolute: 0 10*3/uL (ref 0.0–0.5)
Eosinophils Relative: 0 %
HCT: 53 % — ABNORMAL HIGH (ref 39.0–52.0)
Hemoglobin: 17.8 g/dL — ABNORMAL HIGH (ref 13.0–17.0)
Immature Granulocytes: 1 %
Lymphocytes Relative: 8 %
Lymphs Abs: 1 10*3/uL (ref 0.7–4.0)
MCH: 31.5 pg (ref 26.0–34.0)
MCHC: 33.6 g/dL (ref 30.0–36.0)
MCV: 93.8 fL (ref 80.0–100.0)
Monocytes Absolute: 0.9 10*3/uL (ref 0.1–1.0)
Monocytes Relative: 7 %
Neutro Abs: 10.8 10*3/uL — ABNORMAL HIGH (ref 1.7–7.7)
Neutrophils Relative %: 83 %
Platelets: 307 10*3/uL (ref 150–400)
RBC: 5.65 MIL/uL (ref 4.22–5.81)
RDW: 13.9 % (ref 11.5–15.5)
WBC: 12.8 10*3/uL — ABNORMAL HIGH (ref 4.0–10.5)
nRBC: 0 % (ref 0.0–0.2)

## 2022-02-17 LAB — BASIC METABOLIC PANEL
Anion gap: 14 (ref 5–15)
BUN: 11 mg/dL (ref 6–20)
CO2: 23 mmol/L (ref 22–32)
Calcium: 9.2 mg/dL (ref 8.9–10.3)
Chloride: 101 mmol/L (ref 98–111)
Creatinine, Ser: 0.83 mg/dL (ref 0.61–1.24)
GFR, Estimated: >60 mL/min (ref >60)
Glucose, Bld: 111 mg/dL — ABNORMAL HIGH (ref 70–99)
Potassium: 3.7 mmol/L (ref 3.5–5.1)
Sodium: 138 mmol/L (ref 135–145)

## 2022-02-17 LAB — HIV ANTIBODY (ROUTINE TESTING W REFLEX): HIV Screen 4th Generation wRfx: NONREACTIVE

## 2022-02-17 MED ORDER — HYDROMORPHONE HCL 1 MG/ML IJ SOLN
0.5000 mg | INTRAMUSCULAR | Status: DC | PRN
  Administered 2022-02-17 – 2022-02-21 (×22): 0.5 mg via INTRAVENOUS
  Filled 2022-02-17 (×23): qty 0.5

## 2022-02-17 MED ORDER — NICOTINE 21 MG/24HR TD PT24
21.0000 mg | MEDICATED_PATCH | Freq: Every day | TRANSDERMAL | Status: DC
  Administered 2022-02-17 – 2022-02-21 (×5): 21 mg via TRANSDERMAL
  Filled 2022-02-17 (×5): qty 1

## 2022-02-17 MED ORDER — ONDANSETRON HCL 4 MG/2ML IJ SOLN
4.0000 mg | Freq: Once | INTRAMUSCULAR | Status: AC
  Administered 2022-02-17: 4 mg via INTRAVENOUS
  Filled 2022-02-17: qty 2

## 2022-02-17 MED ORDER — PANTOPRAZOLE SODIUM 40 MG PO TBEC
40.0000 mg | DELAYED_RELEASE_TABLET | Freq: Every day | ORAL | Status: DC
  Administered 2022-02-17 – 2022-02-21 (×5): 40 mg via ORAL
  Filled 2022-02-17 (×5): qty 1

## 2022-02-17 MED ORDER — SODIUM CHLORIDE 0.9 % IV SOLN: INTRAVENOUS | Status: DC

## 2022-02-17 MED ORDER — ZOLPIDEM TARTRATE 5 MG PO TABS
10.0000 mg | ORAL_TABLET | Freq: Every evening | ORAL | Status: DC | PRN
  Administered 2022-02-19 – 2022-02-20 (×2): 10 mg via ORAL
  Filled 2022-02-17 (×2): qty 2

## 2022-02-17 MED ORDER — PROCHLORPERAZINE EDISYLATE 10 MG/2ML IJ SOLN
10.0000 mg | INTRAMUSCULAR | Status: DC | PRN
  Administered 2022-02-18: 10 mg via INTRAVENOUS
  Filled 2022-02-17: qty 2

## 2022-02-17 MED ORDER — FENTANYL CITRATE (PF) 100 MCG/2ML IJ SOLN
100.0000 ug | Freq: Once | INTRAMUSCULAR | Status: AC
  Administered 2022-02-17: 100 ug via INTRAVENOUS
  Filled 2022-02-17: qty 2

## 2022-02-17 MED ORDER — GABAPENTIN 300 MG PO CAPS
300.0000 mg | ORAL_CAPSULE | Freq: Three times a day (TID) | ORAL | Status: DC | PRN
  Administered 2022-02-17 – 2022-02-19 (×5): 300 mg via ORAL
  Filled 2022-02-17 (×5): qty 1

## 2022-02-17 MED ORDER — HEPARIN SODIUM (PORCINE) 5000 UNIT/ML IJ SOLN
5000.0000 [IU] | Freq: Three times a day (TID) | INTRAMUSCULAR | Status: DC
  Administered 2022-02-18 – 2022-02-21 (×10): 5000 [IU] via SUBCUTANEOUS
  Filled 2022-02-17 (×12): qty 1

## 2022-02-17 MED ORDER — GABAPENTIN 300 MG PO CAPS
300.0000 mg | ORAL_CAPSULE | Freq: Four times a day (QID) | ORAL | Status: DC
  Administered 2022-02-17: 300 mg via ORAL
  Filled 2022-02-17: qty 1

## 2022-02-17 MED ORDER — ACETAMINOPHEN 650 MG RE SUPP: 650.0000 mg | Freq: Four times a day (QID) | RECTAL | Status: DC | PRN

## 2022-02-17 MED ORDER — ALPRAZOLAM 1 MG PO TABS
1.0000 mg | ORAL_TABLET | Freq: Two times a day (BID) | ORAL | Status: DC | PRN
  Administered 2022-02-17: 1 mg via ORAL
  Filled 2022-02-17: qty 1

## 2022-02-17 MED ORDER — OXYCODONE HCL 5 MG PO TABS
5.0000 mg | ORAL_TABLET | ORAL | Status: DC | PRN
  Administered 2022-02-17 – 2022-02-21 (×15): 5 mg via ORAL
  Filled 2022-02-17 (×15): qty 1

## 2022-02-17 MED ORDER — ALPRAZOLAM 0.5 MG PO TABS
0.5000 mg | ORAL_TABLET | Freq: Two times a day (BID) | ORAL | Status: DC | PRN
  Administered 2022-02-17 – 2022-02-18 (×3): 0.5 mg via ORAL
  Filled 2022-02-17 (×3): qty 1

## 2022-02-17 MED ORDER — ACETAMINOPHEN 325 MG PO TABS
650.0000 mg | ORAL_TABLET | Freq: Four times a day (QID) | ORAL | Status: DC | PRN
  Administered 2022-02-20 – 2022-02-21 (×3): 650 mg via ORAL
  Filled 2022-02-17 (×3): qty 2

## 2022-02-17 MED ORDER — ONDANSETRON HCL 4 MG PO TABS
4.0000 mg | ORAL_TABLET | Freq: Four times a day (QID) | ORAL | Status: DC | PRN
  Administered 2022-02-18 – 2022-02-19 (×2): 4 mg via ORAL
  Filled 2022-02-17 (×2): qty 1

## 2022-02-17 MED ORDER — IOHEXOL 9 MG/ML PO SOLN
ORAL | Status: AC
  Filled 2022-02-17: qty 1000

## 2022-02-17 MED ORDER — ONDANSETRON HCL 4 MG/2ML IJ SOLN
4.0000 mg | Freq: Four times a day (QID) | INTRAMUSCULAR | Status: DC | PRN
  Administered 2022-02-17 – 2022-02-18 (×4): 4 mg via INTRAVENOUS
  Filled 2022-02-17 (×4): qty 2

## 2022-02-17 MED ORDER — IOHEXOL 300 MG/ML  SOLN
80.0000 mL | Freq: Once | INTRAMUSCULAR | Status: AC | PRN
  Administered 2022-02-17: 80 mL via INTRAVENOUS

## 2022-02-17 MED ORDER — MORPHINE SULFATE (PF) 2 MG/ML IV SOLN
2.0000 mg | INTRAVENOUS | Status: DC | PRN
  Administered 2022-02-17 (×2): 2 mg via INTRAVENOUS
  Filled 2022-02-17 (×2): qty 1

## 2022-02-17 MED ORDER — HYDROMORPHONE HCL 1 MG/ML IJ SOLN
0.5000 mg | Freq: Once | INTRAMUSCULAR | Status: AC
  Administered 2022-02-17: 0.5 mg via INTRAVENOUS
  Filled 2022-02-17: qty 0.5

## 2022-02-17 MED ORDER — ALBUTEROL SULFATE (2.5 MG/3ML) 0.083% IN NEBU: 2.5000 mg | INHALATION_SOLUTION | RESPIRATORY_TRACT | Status: DC | PRN

## 2022-02-17 MED ORDER — SODIUM CHLORIDE 0.9 % IV BOLUS
1000.0000 mL | Freq: Once | INTRAVENOUS | Status: AC
  Administered 2022-02-17: 1000 mL via INTRAVENOUS

## 2022-02-17 NOTE — Care Management Obs Status (Signed)
[facility] NOTIFICATION   Patient Details  Name: Brent [name] MRN: [id number] Date of Birth: 1970/05/19   Medicare Observation Status Notification Given:  Yes    Boneta Lucks, RN 02/17/2022, 2:22 PM

## 2022-02-17 NOTE — TOC Progression Note (Signed)
  Transition of Care [facility]) Screening Note   Patient Details  Name: Brent [name] Date of Birth: June 02, 1970   Transition of Care [facility]) CM/SW Contact:    Boneta Lucks, RN Phone Number: 02/17/2022, 2:24 PM    Transition of Care Department [facility]) has reviewed patient and no TOC needs have been identified at this time. We will continue to monitor patient advancement through interdisciplinary progression rounds. If new patient transition needs arise, please place a TOC consult.    Barriers to Discharge: Continued Medical Work up  Expected Discharge Plan and Services      Living arrangements for the past 2 months: Single Family Home                    Social Determinants of Health (SDOH) Interventions SDOH Screenings   Food Insecurity: No Food Insecurity (02/17/2022)  Housing: Low Risk  (02/17/2022)  Transportation Needs: No Transportation Needs (02/17/2022)  Utilities: Not At Risk (02/17/2022)  Financial Resource Strain: Low Risk  (02/24/2018)  Tobacco Use: High Risk (02/17/2022)

## 2022-02-17 NOTE — ED Provider Notes (Signed)
[facility] EMERGENCY DEPARTMENT Provider Note   CSN: [id number] Arrival date & time: 02/17/22  0033     History  Chief Complaint  Patient presents with   Abdominal Pain    Brent [name] is a 52 y.o. male.  The history is provided by the patient.  Patient with history of alcoholic pancreatitis, multiple sclerosis presents with abdominal pain.  He reports worsening diffuse abdominal pain for the past 24 hours.  He reports associated vomiting/nonbloody emesis No diarrhea.  No chest pain.  He reports he had similar episodes previously.  Patient reports he used to have a G-tube but no longer required and it fell out     Past Medical History:  Diagnosis Date   Alcohol use    Anxiety    Disabled due to panic attacks   Arthritis    Bilateral ureteral calculi    Borderline type 2 diabetes mellitus    BPH (benign prostatic hypertrophy)    Chronic back pain    Condylomata acuminata in male    multiple procedures --  penile, peri-rectal , perineum   DDD (degenerative disc disease), cervical    Depression    Dyspnea on exertion    Emphysematous COPD (HCC)    Epiretinal membrane (ERM) of both eyes    Fibromyalgia    GERD (gastroesophageal reflux disease)    Headache(784.0)    History of chronic pancreatitis    severeal adx for this /  2008  dx alcoholic pancreatitis   History of esophageal dilatation    History of hiatal hernia    History of kidney stones    History of panic attacks    History of sepsis    adx 05-01-2014--  urosepsis due to kidney stones obstruction/ hydronephrosis   History of suicidal ideation    2006   adx   Hypertension    was on medication for short time, htn was caused by prednisone per pt   Multiple sclerosis (HCC)    pt. states has 4 small brain lesions   Nephrolithiasis    bilateral    Retinal vasculitis    Uveitis     Home Medications Prior to Admission medications   Medication Sig Start Date End Date Taking? Authorizing Provider   albuterol (PROVENTIL HFA;VENTOLIN HFA) 108 (90 Base) MCG/ACT inhaler Inhale 1-2 puffs into the lungs every 6 (six) hours as needed for wheezing or shortness of breath.  07/25/15   [provider]  ALPRAZolam Prudy Feeler) 1 MG tablet Take 1 tablet (1 mg total) by mouth 2 (two) times daily as needed for anxiety. Patient taking differently: Take 1 mg by mouth 4 (four) times daily as needed for anxiety. 09/17/19   Johnson, Clanford L, MD  Calcium Carbonate-Vitamin D 600-200 MG-UNIT TABS Take 1 tablet by mouth daily.    [provider]  cetirizine (ZYRTEC) 10 MG tablet Take 10 mg by mouth daily. 11/23/21   [provider]  Cholecalciferol (VITAMIN D HIGH POTENCY) 25 MCG (1000 UT) capsule Take 1,000 Units by mouth daily.    [provider]  CREON 3000-9500 units CPEP Take 1 capsule by mouth 3 (three) times daily with meals. 05/17/19   [provider]  cyanocobalamin (VITAMIN B12) 1000 MCG tablet Take 2,000 mcg by mouth daily. 11/23/21   [provider]  docusate sodium (COLACE) 100 MG capsule Take 100 mg by mouth 2 (two) times daily. 11/23/21   [provider]  gabapentin (NEURONTIN) 300 MG capsule Take 300 mg by  mouth 4 (four) times daily. 11/23/21   [provider]  HYDROcodone-acetaminophen (NORCO) 10-325 MG tablet Take 1 tablet by mouth every 6 (six) hours as needed for severe pain. For pain 09/17/19   Johnson, Clanford L, MD  ketorolac (ACULAR) 0.5 % ophthalmic solution Place 2 drops into both eyes 4 (four) times daily.    [provider]  omeprazole (PRILOSEC) 40 MG capsule Take 40 mg by mouth at bedtime.    [provider]  ondansetron (ZOFRAN) 4 MG tablet Take 4 mg by mouth every 8 (eight) hours as needed for nausea.  05/28/19   [provider]  prednisoLONE acetate (PRED FORTE) 1 % ophthalmic suspension Place 2 drops into both eyes 2 (two) times daily.  01/24/15   [provider]  SYMBICORT 80-4.5  MCG/ACT inhaler Inhale 1 puff into the lungs 2 (two) times daily. 04/23/19   [provider]  zolpidem (AMBIEN) 10 MG tablet Take 10 mg by mouth at bedtime as needed.    [provider]      Allergies    Patient has no known allergies.    Review of Systems   Review of Systems  Gastrointestinal:  Positive for abdominal pain and vomiting.    Physical Exam Updated Vital Signs BP 124/82   Pulse 73   Temp 99.2 F (37.3 C) (Oral)   Resp 12   Ht 1.702 m (5\' 7" )   Wt 54.4 kg   SpO2 96%   BMI 18.79 kg/m  Physical Exam CONSTITUTIONAL: Chronically ill-appearing HEAD: Normocephalic/atraumatic EYES: EOMI/PERRL ENMT: Mucous membranes dry, poor dentition NECK: supple no meningeal signs CV: S1/S2 noted, no murmurs/rubs/gallops noted LUNGS: Lungs are clear to auscultation bilaterally, no apparent distress ABDOMEN: soft, diffuse moderate tenderness Healed G-tube site noted, no active drainage.  Mild erythema noted NEURO: Pt is awake/alert/appropriate, moves all extremitiesx4.  No facial droop.   EXTREMITIES: pulses normal/equal, full ROM SKIN: warm, color normal PSYCH: Anxious  ED Results / Procedures / Treatments   Labs (all labs ordered are listed, but only abnormal results are displayed) Labs Reviewed  CBC WITH DIFFERENTIAL/PLATELET - Abnormal; Notable for the following components:      Result Value   WBC 12.8 (*)    Hemoglobin 17.8 (*)    HCT 53.0 (*)    Neutro Abs 10.8 (*)    All other components within normal limits  BASIC METABOLIC PANEL - Abnormal; Notable for the following components:   Glucose, Bld 111 (*)    All other components within normal limits  LIPASE, BLOOD - Abnormal; Notable for the following components:   Lipase 199 (*)    All other components within normal limits    EKG EKG Interpretation  Date/Time:  Sunday February 17 2022 01:54:22 EST Ventricular Rate:  83 PR Interval:    QRS Duration: 101 QT Interval:  386 QTC  Calculation: 454 R Axis:   69 Text Interpretation: RSR' in V1 or V2, probably normal variant Confirmed by 09-30-1980 (Zadie Rhine) on 02/17/2022 3:02:07 AM  Radiology CT ABDOMEN PELVIS W CONTRAST  Result Date: 02/17/2022 CLINICAL DATA:  Acute abdominal pain. EXAM: CT ABDOMEN AND PELVIS WITH CONTRAST TECHNIQUE: Multidetector CT imaging of the abdomen and pelvis was performed using the standard protocol following bolus administration of intravenous contrast. RADIATION DOSE REDUCTION: This exam was performed according to the departmental dose-optimization program which includes automated exposure control, adjustment of the mA and/or kV according to patient size and/or use of iterative reconstruction technique. CONTRAST:  39mL  OMNIPAQUE IOHEXOL 300 MG/ML  SOLN COMPARISON:  None Available. FINDINGS: Lower chest: No acute abnormality. Hepatobiliary: No focal liver abnormality is seen. No gallstones, gallbladder wall thickening, or biliary dilatation. Pancreas: Coarse calcifications in the pancreas compatible with sequela of chronic pancreatitis. Similar pancreatic ductal dilation measuring up to 5 mm. There is new hazy edema about the body and tail of the pancreas suggestive of acute on chronic pancreatitis. No organized fluid collection or pseudocysts. Spleen: Unremarkable. Adrenals/Urinary Tract: Normal adrenal glands. No urinary calculi or hydronephrosis. Unremarkable bladder. Stomach/Bowel: Normal caliber large and small bowel. Colonic diverticulosis without diverticulitis. Postoperative change about the stomach. Appendix not identified. Vascular/Lymphatic: Aortic atherosclerosis. Patent portal vein. 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 No enlarged abdominal or pelvic lymph nodes. Reproductive: Mild enlargement of the prostate. Other: No free intraperitoneal air. Musculoskeletal: No acute osseous abnormality. Chronic compression deformity of L2. Advanced endplate  degenerative change at L4-L5. IMPRESSION: Acute on chronic pancreatitis. Aortic Atherosclerosis (ICD10-I70.0). Electronically Signed   By: Minerva Fester M.D.   On: 02/17/2022 04:08    Procedures Procedures    Medications Ordered in ED Medications  iohexol (OMNIPAQUE) 9 MG/ML oral solution (has no administration in time range)  fentaNYL (SUBLIMAZE) injection 100 mcg (100 mcg Intravenous Given 02/17/22 0319)  ondansetron (ZOFRAN) injection 4 mg (4 mg Intravenous Given 02/17/22 0319)  iohexol (OMNIPAQUE) 300 MG/ML solution 80 mL (80 mLs Intravenous Contrast Given 02/17/22 0346)  fentaNYL (SUBLIMAZE) injection 100 mcg (100 mcg Intravenous Given 02/17/22 0421)  ondansetron (ZOFRAN) injection 4 mg (4 mg Intravenous Given 02/17/22 0420)    ED Course/ Medical Decision Making/ A&P Clinical Course as of 02/17/22 0507  Sun Feb 17, 2022  0424 WBC(!): 12.8 Leukocytosis [DW]  0424 Lipase(!): 199 Elevated lipase consistent with pancreatitis [DW]  0458 Pt still reports pain and doesn't think he can keep down PO meds.  Will admit for pancreatitis  [DW]    Clinical Course User Index [DW] Zadie Rhine, MD                           Medical Decision Making Amount and/or Complexity of Data Reviewed Labs: ordered. Decision-making details documented in ED Course. Radiology: ordered. ECG/medicine tests: ordered.  Risk Prescription drug management. Decision regarding hospitalization.   This patient presents to the ED for concern of abdominal pain, this involves an extensive number of treatment options, and is a complaint that carries with it a high risk of complications and morbidity.  The differential diagnosis includes but is not limited to cholecystitis, cholelithiasis, pancreatitis, gastritis, peptic ulcer disease, appendicitis, bowel obstruction, bowel perforation, diverticulitis, AAA, ischemic bowel    Comorbidities that complicate the patient evaluation: Patient's presentation is complicated by  their history of pancreatitis, MS  Social Determinants of Health: Patient's  chronic pain   increases the complexity of managing their presentation  Additional history obtained: Records reviewed Care Everywhere/External Records  Lab Tests: I Ordered, and personally interpreted labs.  The pertinent results include: Leukocytosis  Imaging Studies ordered: I ordered imaging studies including CT abdomen pelvis consistent with pancreatitis I independently visualized and interpreted imaging which showed pancreatitis I agree with the radiologist interpretation   Medicines ordered and prescription drug management: I ordered medication including fentanyl for pain Reevaluation of the patient after these medicines showed that the patient    improved  Critical Interventions:   pain management and nausea management  Consultations Obtained: I requested consultation with the  admitting physician Dr Josph Macho with [facility] , and discussed  findings as well as pertinent plan - they recommend: admit  Reevaluation: After the interventions noted above, I reevaluated the patient and found that they have :improved  Complexity of problems addressed: Patient's presentation is most consistent with  acute presentation with potential threat to life or bodily function  Disposition: After consideration of the diagnostic results and the patient's response to treatment,  I feel that the patent would benefit from admission   .           Final Clinical Impression(s) / ED Diagnoses Final diagnoses:  Acute pancreatitis without infection or necrosis, unspecified pancreatitis type    Rx / DC Orders ED Discharge Orders     None         Ripley Fraise, MD 02/17/22 [id number]

## 2022-02-17 NOTE — Assessment & Plan Note (Signed)
-   Smokes a pack and half per day - Counseled on the importance of cessation - Nicotine patch ordered, may need more than 1 patch if his cravings or not controlled with the first 1

## 2022-02-17 NOTE — Progress Notes (Signed)
ASSUMPTION OF CARE NOTE   02/17/2022 12:56 PM  Brent [name] was seen and examined.  The H&P by the admitting provider, orders, imaging was reviewed.  Please see new orders.  Will continue to follow.   Vitals:   02/17/22 0528 02/17/22 0605  BP:  133/83  Pulse: 80 83  Resp: 13 16  Temp:  99.2 F (37.3 C)  SpO2: 94% 95%    Results for orders placed or performed during the hospital encounter of 02/17/22  CBC with Differential  Result Value Ref Range   WBC 12.8 (H) 4.0 - 10.5 K/uL   RBC 5.65 4.22 - 5.81 MIL/uL   Hemoglobin 17.8 (H) 13.0 - 17.0 g/dL   HCT 53.0 (H) 39.0 - 52.0 %   MCV 93.8 80.0 - 100.0 fL   MCH 31.5 26.0 - 34.0 pg   MCHC 33.6 30.0 - 36.0 g/dL   RDW 13.9 11.5 - 15.5 %   Platelets 307 150 - 400 K/uL   nRBC 0.0 0.0 - 0.2 %   Neutrophils Relative % 83 %   Neutro Abs 10.8 (H) 1.7 - 7.7 K/uL   Lymphocytes Relative 8 %   Lymphs Abs 1.0 0.7 - 4.0 K/uL   Monocytes Relative 7 %   Monocytes Absolute 0.9 0.1 - 1.0 K/uL   Eosinophils Relative 0 %   Eosinophils Absolute 0.0 0.0 - 0.5 K/uL   Basophils Relative 1 %   Basophils Absolute 0.1 0.0 - 0.1 K/uL   Immature Granulocytes 1 %   Abs Immature Granulocytes 0.07 0.00 - 0.07 K/uL  Basic metabolic panel  Result Value Ref Range   Sodium 138 135 - 145 mmol/L   Potassium 3.7 3.5 - 5.1 mmol/L   Chloride 101 98 - 111 mmol/L   CO2 23 22 - 32 mmol/L   Glucose, Bld 111 (H) 70 - 99 mg/dL   BUN 11 6 - 20 mg/dL   Creatinine, Ser 0.83 0.61 - 1.24 mg/dL   Calcium 9.2 8.9 - 10.3 mg/dL   GFR, Estimated >60 >60 mL/min   Anion gap 14 5 - 15  Lipase, blood  Result Value Ref Range   Lipase 199 (H) 11 - 51 U/L    C. Wynetta Emery, MD [facility]   02/17/2022 12:34 AM How to contact the [facility] Attending or Consulting provider [place] or covering provider during after hours [place], for this patient?  Check the care team in [facility] and look for a) attending/consulting [facility] provider listed and b) the [facility] team listed Log into [email or web address]  and use [place]'s universal password to access. If you do not have the password, please contact the hospital operator. Locate the [facility] provider you are looking for under [facility] and page to a number that you [name] be directly reached. If you still have difficulty reaching the provider, please page the [facility] (Director on Call) for the Hospitalists listed on amion for assistance.

## 2022-02-17 NOTE — ED Triage Notes (Signed)
Pt arrived via [facility] c/o recurrent pancreatic/abdominal pain. Pt reports pain has been persistent since yesterday.

## 2022-02-17 NOTE — Assessment & Plan Note (Signed)
-   Lipase remains elevated, clinically he seems to be improving - CT abdomen pelvis shows acute on chronic pancreatitis - Patient reports his last alcoholic drink was in 7026 - Thinks that his pancreatitis is flared because he had too much hamburger 1 day prior to arrival - Trend lipase---coming down with conservative measures - advance to full liquid diet today per patient request - IV fluids - Pain control (careful with escalation of doses) - Continue to monitor, clinically he is improving and I told him could likely go home in next 1-2 days

## 2022-02-17 NOTE — H&P (Signed)
History and Physical    Patient: Brent [name] Brent [name] DOB: 06-17-1970 DOA: 02/17/2022 DOS: the patient was seen and examined on 02/17/2022 PCP: Gareth Morgan, MD  Patient coming from: Home  Chief Complaint:  Chief Complaint  Patient presents with   Abdominal Pain   HPI: Brent [name] is a 52 y.o. male with medical history significant of chronic pancreatitis, history of alcohol abuse, depression, anxiety, GERD, MS, and more presents the ED with a chief complaint of abdominal pain.  Patient was abdominal pain started at 1 PM on January 6.  Reports has been progressively worsening since it started.  Normally when he is a pancreatic flare he can keep water down, but this time he has not even been able to keep water down.  He has associated nausea and vomiting.  Nonbloody emesis.  Patient reports that usually he is able to not eat anything for a day and his pain goes away, but that was not the case this time.  He was given multiple doses of fentanyl in the ED, he reports he is 1 improved his pain by 50%, but then the pain will creep back up.  He describes it as a severe pain that feels like an ice knife being stabbed in the hand and then twisted.  He reports he is thrown up more times than he can count.  His last normal meal was on the fifth.  His last normal bowel movement was 2 days ago.  He has not had any diarrhea.  Patient reports subjective fever and chills, but no measured temp.  Patient has no other complaints at this time.  Patient smokes 1.5 packs/day, quit drinking alcohol in 2013 and reports drop since, no illicit drugs.  Patient is vaccinated for COVID and flu.  Patient is full code. Review of Systems: As mentioned in the history of present illness. All other systems reviewed and are negative. Past Medical History:  Diagnosis Date   Alcohol use    Anxiety    Disabled due to panic attacks   Arthritis    Bilateral ureteral calculi    Borderline type 2 diabetes mellitus    BPH  (benign prostatic hypertrophy)    Chronic back pain    Condylomata acuminata in male    multiple procedures --  penile, peri-rectal , perineum   DDD (degenerative disc disease), cervical    Depression    Dyspnea on exertion    Emphysematous COPD (HCC)    Epiretinal membrane (ERM) of both eyes    Fibromyalgia    GERD (gastroesophageal reflux disease)    Headache(784.0)    History of chronic pancreatitis    severeal adx for this /  2008  dx alcoholic pancreatitis   History of esophageal dilatation    History of hiatal hernia    History of kidney stones    History of panic attacks    History of sepsis    adx 05-01-2014--  urosepsis due to kidney stones obstruction/ hydronephrosis   History of suicidal ideation    2006   adx   Hypertension    was on medication for short time, htn was caused by prednisone per pt   Multiple sclerosis (HCC)    pt. states has 4 small brain lesions   Nephrolithiasis    bilateral    Retinal vasculitis    Uveitis    Past Surgical History:  Procedure Laterality Date   BIOPSY  11/12/2012   Procedure: GASTRIC AND ESOPHAGEAL BIOPSIES;  Surgeon:  Corbin Ade, MD;  Location: [facility];  Service: Endoscopy;;   CATARACT EXTRACTION W/ INTRAOCULAR LENS  IMPLANT, BILATERAL     COLONOSCOPY  10/08/2011   Jenkins:Normal colon/Anal condyloma without extension proximal to dentate line   CYSTOSCOPY W/ URETERAL STENT PLACEMENT Bilateral 05/01/2014   Procedure: CYSTOSCOPY WITH BILATERAL RETROGRADE PYELOGRAM; BILATERAL URETERAL STENT PLACEMENT;  Surgeon: Barron Alvine, MD;  Location: [facility];  Service: Urology;  Laterality: Bilateral;   CYSTOSCOPY W/ URETERAL STENT REMOVAL Bilateral 06/06/2014   Procedure: CYSTOSCOPY WITH STENT REMOVAL;  Surgeon: Marcine Matar, MD;  Location: [facility];  Service: Urology;  Laterality: Bilateral;   CYSTOSCOPY WITH URETEROSCOPY  06/06/2014   Procedure: CYSTOSCOPY WITH URETEROSCOPY;  Surgeon: Marcine Matar, MD;  Location:  [facility];  Service: Urology;;   CYSTOSCOPY WITH URETEROSCOPY AND STENT PLACEMENT Bilateral 06/06/2014   Procedure: CYSTOSCOPY WITH J2 STENT EXTRACTION,,URETEROSCOPY WITH EXTRACTION OF STONES,;  Surgeon: Marcine Matar, MD;  Location: [facility];  Service: Urology;  Laterality: Bilateral;   ELECTROCAUTERY/ DESICCATION OF CONDYLOMA LESIONS  01-15-2008  &  12-29-2009   PENIS, PERI-RECTAL , PERINUEM   ESOPHAGOGASTRODUODENOSCOPY (EGD) WITH PROPOFOL N/A 11/12/2012   [id number] esophagus s/p  passage of a Maloney dilator and biopsy. Abnormal gastric mucosa-status post biopsy   FLEXIBLE BRONCHOSCOPY N/A 08/12/2012   Procedure: FLEXIBLE BRONCHOSCOPY;  Surgeon: Fredirick Maudlin, MD;  Location: [facility];  Service: Pulmonary;  Laterality: N/A;   MALONEY DILATION N/A 11/12/2012   Procedure: MALONEY DILATION (45mm);  Surgeon: Corbin Ade, MD;  Location: [facility];  Service: Endoscopy;  Laterality: N/A;   MULTIPLE EXTRACTIONS WITH ALVEOLOPLASTY N/A 04/22/2014   Procedure: MULTIPLE EXTRACTIONS ( 2,3,5,6,7,8,9,10,11,13,14,15,21,28  WITH ALVEOLOPLASTY;  Surgeon: Ocie Doyne, DDS;  Location: [facility];  Service: Oral Surgery;  Laterality: N/A;   WISDOM TOOTH EXTRACTION     Social History:  reports that he has been smoking cigarettes and cigars. He has a 20.00 pack-year smoking history. He has never used smokeless tobacco. He reports that he does not currently use alcohol. He reports that he does not use drugs.  No Known Allergies  Family History  Problem Relation Age of Onset   Hypertension Mother    Breast cancer Mother    Melanoma Mother    Stroke Mother    Lung cancer Father 49   Colon cancer Neg Hx    Colitis Neg Hx    Cirrhosis Neg Hx    Liver disease Neg Hx    Pancreatic cancer Neg Hx    Pancreatitis Neg Hx    Anesthesia problems Neg Hx    Hypotension Neg Hx    Malignant hyperthermia Neg Hx    Pseudochol deficiency Neg Hx     Prior to Admission medications    Medication Sig Start Date End Date Taking? Authorizing Provider  albuterol (PROVENTIL HFA;VENTOLIN HFA) 108 (90 Base) MCG/ACT inhaler Inhale 1-2 puffs into the lungs every 6 (six) hours as needed for wheezing or shortness of breath.  07/25/15   [provider]  ALPRAZolam Prudy Feeler) 1 MG tablet Take 1 tablet (1 mg total) by mouth 2 (two) times daily as needed for anxiety. Patient taking differently: Take 1 mg by mouth 4 (four) times daily as needed for anxiety. 09/17/19   Johnson, Clanford L, MD  Calcium Carbonate-Vitamin D 600-200 MG-UNIT TABS Take 1 tablet by mouth daily.    [provider]  cetirizine (ZYRTEC) 10 MG tablet Take 10 mg by mouth daily. 11/23/21   [provider]  Cholecalciferol (VITAMIN D HIGH POTENCY) 25 MCG (1000 UT) capsule Take 1,000 Units by mouth daily.    [provider]  CREON 3000-9500 units CPEP Take 1 capsule by mouth 3 (three) times daily with meals. 05/17/19   [provider]  cyanocobalamin (VITAMIN B12) 1000 MCG tablet Take 2,000 mcg by mouth daily. 11/23/21   [provider]  docusate sodium (COLACE) 100 MG capsule Take 100 mg by mouth 2 (two) times daily. 11/23/21   [provider]  gabapentin (NEURONTIN) 300 MG capsule Take 300 mg by mouth 4 (four) times daily. 11/23/21   [provider]  HYDROcodone-acetaminophen (NORCO) 10-325 MG tablet Take 1 tablet by mouth every 6 (six) hours as needed for severe pain. For pain 09/17/19   Johnson, Clanford L, MD  ketorolac (ACULAR) 0.5 % ophthalmic solution Place 2 drops into both eyes 4 (four) times daily.    [provider]  omeprazole (PRILOSEC) 40 MG capsule Take 40 mg by mouth at bedtime.    [provider]  ondansetron (ZOFRAN) 4 MG tablet Take 4 mg by mouth every 8 (eight) hours as needed for nausea.  05/28/19   [provider]  prednisoLONE acetate (PRED FORTE) 1 % ophthalmic suspension Place 2 drops into both eyes 2 (two) times  daily.  01/24/15   [provider]  SYMBICORT 80-4.5 MCG/ACT inhaler Inhale 1 puff into the lungs 2 (two) times daily. 04/23/19   [provider]  zolpidem (AMBIEN) 10 MG tablet Take 10 mg by mouth at bedtime as needed.    [provider]    Physical Exam: Vitals:   02/17/22 0330 02/17/22 0528 02/17/22 0558 02/17/22 0605  BP: 124/82   133/83  Pulse: 73 80  83  Resp: 12 13  16   Temp:    99.2 F (37.3 C)  TempSrc:      SpO2: 96% 94%  95%  Weight:   50 kg   Height:   5\' 7"  (1.702 m)    1.  General: Patient lying supine in bed, chronically ill-appearing no acute distress   2. Psychiatric: Alert and oriented x 3, mood and behavior normal for situation, pleasant and cooperative with exam   3. Neurologic: Speech and language are normal, face is symmetric, moves all 4 extremities voluntarily, at baseline without acute deficits on limited exam   4. HEENMT:  Head is atraumatic, normocephalic, pupils reactive to light, neck is supple, trachea is midline, mucous membranes are moist   5. Respiratory : Lungs are clear to auscultation bilaterally without wheezing, rhonchi, rales, no cyanosis, no increase in work of breathing or accessory muscle use   6. Cardiovascular : Heart rate normal, rhythm is regular, no murmurs, rubs or gallops, no peripheral edema, peripheral pulses palpated   7. Gastrointestinal:  Abdomen is soft, nondistended, tender to palpation in the left upper quadrant without guarding, bowel sounds active, no masses or organomegaly palpated   8. Skin:  Skin is warm, dry and intact without rashes, acute lesions, or ulcers on limited exam   9.Musculoskeletal:  No acute deformities or trauma, no asymmetry in tone, no peripheral edema, peripheral pulses palpated, no tenderness to palpation in the extremities  Data Reviewed: In the ED 99.2, heart rate 73-100, respiratory rate-20, blood pressure 119/78-124/87, satting 94-96% No leukocytosis with  white blood cell count 12.8, hemoglobin 17.8 Chemistry is unremarkable CT abdomen pelvis shows Chronic pancreatitis Lipase 199  EKG shows a heart rate of 83, sinus QTc  154 Patient was given 2 doses of 100 and fentanyl IV and 3 doses of 4 mg Zofran IV as well as 1 L normal saline in the ED Admission requested for intractable abdominal pain secondary to acute on chronic pancreatitis  Assessment and Plan: * Acute on chronic pancreatitis (HCC) - Lipase mildly elevated 99 - CT abdomen pelvis shows acute on chronic pancreatitis - Patient reports his last alcoholic drink was in 2013 - Thinks that his pancreatitis is flared because he had too much hamburger yesterday - Trend lipase - N.p.o. - IV fluids - Pain control - Continue to monitor  Leukocytosis - Leukocytosis at 5.8 - Likely related to pancreatitis - No infectious symptoms outside of what can be explained by pancreatitis - Continue to monitor  Tobacco abuse - Smokes a pack and half per day - Counseled on the importance of cessation - Nicotine patch ordered, may need more than 1 patch if his cravings or not controlled with the first 1  Anxiety - Continue Xanax      Advance Care Planning:   Code Status: Full Code  Consults: None  Family Communication: No family at bedside  Severity of Illness: The appropriate patient status for this patient is OBSERVATION. Observation status is judged to be reasonable and necessary in order to provide the required intensity of service to ensure the patient's safety. The patient's presenting symptoms, physical exam findings, and initial radiographic and laboratory data in the context of their medical condition is felt to place them at decreased risk for further clinical deterioration. Furthermore, it is anticipated that the patient will be medically stable for discharge from the hospital within 2 midnights of admission.   Author: Lilyan Gilford, DO 02/17/2022 6:19 AM  For on call  review [email or web address].

## 2022-02-17 NOTE — Assessment & Plan Note (Signed)
-   Continue Xanax 

## 2022-02-17 NOTE — Progress Notes (Signed)
Pt refuses 2pm heparin shot

## 2022-02-17 NOTE — Assessment & Plan Note (Signed)
-   Likely related to pancreatitis - No infectious symptoms outside of what can be explained by pancreatitis - WBC down to normal

## 2022-02-18 DIAGNOSIS — J439 Emphysema, unspecified: Secondary | ICD-10-CM | POA: Diagnosis present

## 2022-02-18 DIAGNOSIS — Z823 Family history of stroke: Secondary | ICD-10-CM | POA: Diagnosis not present

## 2022-02-18 DIAGNOSIS — F419 Anxiety disorder, unspecified: Secondary | ICD-10-CM | POA: Diagnosis present

## 2022-02-18 DIAGNOSIS — Z808 Family history of malignant neoplasm of other organs or systems: Secondary | ICD-10-CM | POA: Diagnosis not present

## 2022-02-18 DIAGNOSIS — F1011 Alcohol abuse, in remission: Secondary | ICD-10-CM | POA: Diagnosis present

## 2022-02-18 DIAGNOSIS — Z8249 Family history of ischemic heart disease and other diseases of the circulatory system: Secondary | ICD-10-CM | POA: Diagnosis not present

## 2022-02-18 DIAGNOSIS — F17210 Nicotine dependence, cigarettes, uncomplicated: Secondary | ICD-10-CM | POA: Diagnosis present

## 2022-02-18 DIAGNOSIS — E162 Hypoglycemia, unspecified: Secondary | ICD-10-CM | POA: Diagnosis not present

## 2022-02-18 DIAGNOSIS — G35 Multiple sclerosis: Secondary | ICD-10-CM | POA: Diagnosis present

## 2022-02-18 DIAGNOSIS — Z801 Family history of malignant neoplasm of trachea, bronchus and lung: Secondary | ICD-10-CM | POA: Diagnosis not present

## 2022-02-18 DIAGNOSIS — F32A Depression, unspecified: Secondary | ICD-10-CM | POA: Diagnosis present

## 2022-02-18 DIAGNOSIS — K219 Gastro-esophageal reflux disease without esophagitis: Secondary | ICD-10-CM | POA: Diagnosis present

## 2022-02-18 DIAGNOSIS — K860 Alcohol-induced chronic pancreatitis: Secondary | ICD-10-CM | POA: Diagnosis present

## 2022-02-18 DIAGNOSIS — Z87442 Personal history of urinary calculi: Secondary | ICD-10-CM | POA: Diagnosis not present

## 2022-02-18 DIAGNOSIS — K861 Other chronic pancreatitis: Secondary | ICD-10-CM | POA: Diagnosis not present

## 2022-02-18 DIAGNOSIS — M797 Fibromyalgia: Secondary | ICD-10-CM | POA: Diagnosis present

## 2022-02-18 DIAGNOSIS — N400 Enlarged prostate without lower urinary tract symptoms: Secondary | ICD-10-CM | POA: Diagnosis present

## 2022-02-18 DIAGNOSIS — Z5329 Procedure and treatment not carried out because of patient's decision for other reasons: Secondary | ICD-10-CM | POA: Diagnosis not present

## 2022-02-18 DIAGNOSIS — R7303 Prediabetes: Secondary | ICD-10-CM | POA: Diagnosis present

## 2022-02-18 DIAGNOSIS — Z803 Family history of malignant neoplasm of breast: Secondary | ICD-10-CM | POA: Diagnosis not present

## 2022-02-18 DIAGNOSIS — G8929 Other chronic pain: Secondary | ICD-10-CM | POA: Diagnosis present

## 2022-02-18 DIAGNOSIS — D72829 Elevated white blood cell count, unspecified: Secondary | ICD-10-CM | POA: Diagnosis not present

## 2022-02-18 DIAGNOSIS — R509 Fever, unspecified: Secondary | ICD-10-CM | POA: Diagnosis not present

## 2022-02-18 DIAGNOSIS — Z720 Tobacco use: Secondary | ICD-10-CM | POA: Diagnosis not present

## 2022-02-18 DIAGNOSIS — K859 Acute pancreatitis without necrosis or infection, unspecified: Secondary | ICD-10-CM | POA: Diagnosis present

## 2022-02-18 DIAGNOSIS — I10 Essential (primary) hypertension: Secondary | ICD-10-CM | POA: Diagnosis present

## 2022-02-18 DIAGNOSIS — E876 Hypokalemia: Secondary | ICD-10-CM | POA: Diagnosis not present

## 2022-02-18 LAB — COMPREHENSIVE METABOLIC PANEL
ALT: 11 U/L (ref 0–44)
AST: 16 U/L (ref 15–41)
Albumin: 3 g/dL — ABNORMAL LOW (ref 3.5–5.0)
Alkaline Phosphatase: 72 U/L (ref 38–126)
Anion gap: 10 (ref 5–15)
BUN: 10 mg/dL (ref 6–20)
CO2: 20 mmol/L — ABNORMAL LOW (ref 22–32)
Calcium: 8 mg/dL — ABNORMAL LOW (ref 8.9–10.3)
Chloride: 103 mmol/L (ref 98–111)
Creatinine, Ser: 0.68 mg/dL (ref 0.61–1.24)
GFR, Estimated: >60 mL/min (ref >60)
Glucose, Bld: 56 mg/dL — ABNORMAL LOW (ref 70–99)
Potassium: 3.3 mmol/L — ABNORMAL LOW (ref 3.5–5.1)
Sodium: 133 mmol/L — ABNORMAL LOW (ref 135–145)
Total Bilirubin: 1.5 mg/dL — ABNORMAL HIGH (ref 0.3–1.2)
Total Protein: 5.7 g/dL — ABNORMAL LOW (ref 6.5–8.1)

## 2022-02-18 LAB — GLUCOSE, CAPILLARY
Glucose-Capillary: 124 mg/dL — ABNORMAL HIGH (ref 70–99)
Glucose-Capillary: 92 mg/dL (ref 70–99)
Glucose-Capillary: 122 mg/dL — ABNORMAL HIGH (ref 70–99)

## 2022-02-18 LAB — CBC WITH DIFFERENTIAL/PLATELET
Abs Immature Granulocytes: 0.04 10*3/uL (ref 0.00–0.07)
Basophils Absolute: 0.1 10*3/uL (ref 0.0–0.1)
Basophils Relative: 1 %
Eosinophils Absolute: 0.1 10*3/uL (ref 0.0–0.5)
Eosinophils Relative: 1 %
HCT: 44.1 % (ref 39.0–52.0)
Hemoglobin: 14.6 g/dL (ref 13.0–17.0)
Immature Granulocytes: 0 %
Lymphocytes Relative: 14 %
Lymphs Abs: 1.3 10*3/uL (ref 0.7–4.0)
MCH: 31.6 pg (ref 26.0–34.0)
MCHC: 33.1 g/dL (ref 30.0–36.0)
MCV: 95.5 fL (ref 80.0–100.0)
Monocytes Absolute: 0.9 10*3/uL (ref 0.1–1.0)
Monocytes Relative: 9 %
Neutro Abs: 6.9 10*3/uL (ref 1.7–7.7)
Neutrophils Relative %: 75 %
Platelets: 207 10*3/uL (ref 150–400)
RBC: 4.62 MIL/uL (ref 4.22–5.81)
RDW: 13.3 % (ref 11.5–15.5)
WBC: 9.3 10*3/uL (ref 4.0–10.5)
nRBC: 0 % (ref 0.0–0.2)

## 2022-02-18 LAB — LIPASE, BLOOD: Lipase: 127 U/L — ABNORMAL HIGH (ref 11–51)

## 2022-02-18 LAB — MAGNESIUM: Magnesium: 1.8 mg/dL (ref 1.7–2.4)

## 2022-02-18 MED ORDER — POTASSIUM CHLORIDE 10 MEQ/100ML IV SOLN
10.0000 meq | INTRAVENOUS | Status: AC
  Administered 2022-02-18 (×3): 10 meq via INTRAVENOUS
  Filled 2022-02-18 (×3): qty 100

## 2022-02-18 MED ORDER — MAGNESIUM SULFATE 2 GM/50ML IV SOLN
2.0000 g | Freq: Once | INTRAVENOUS | Status: AC
  Administered 2022-02-18: 2 g via INTRAVENOUS
  Filled 2022-02-18: qty 50

## 2022-02-18 MED ORDER — DEXTROSE-NACL 5-0.9 % IV SOLN: INTRAVENOUS | Status: DC

## 2022-02-18 NOTE — Hospital Course (Signed)
52 y.o. male with medical history significant of chronic pancreatitis, history of alcohol abuse, depression, anxiety, GERD, MS, and more presents the ED with a chief complaint of abdominal pain.  Patient was abdominal pain started at 1 PM on January 6.  Reports has been progressively worsening since it started.  Normally when he is a pancreatic flare he can keep water down, but this time he has not even been able to keep water down.  He has associated nausea and vomiting.  Nonbloody emesis.  Patient reports that usually he is able to not eat anything for a day and his pain goes away, but that was not the case this time.  He was given multiple doses of fentanyl in the ED, he reports he is 1 improved his pain by 50%, but then the pain will creep back up.  He describes it as a severe pain that feels like an ice knife being stabbed in the hand and then twisted.  He reports he is thrown up more times than he can count.  His last normal meal was on the fifth.  His last normal bowel movement was 2 days ago.  He has not had any diarrhea.  Patient reports subjective fever and chills, but no measured temp.  Patient has no other complaints at this time.   Patient smokes 1.5 packs/day, quit drinking alcohol in 2013 and reports drop since, no illicit drugs.  Patient is vaccinated for COVID and flu.  Patient is full code.  Patient was placed on bowel rest and IV opioids and IVF.  He had clinical improvement from a pancreatitis standpoint and his diet was advanced.  He continued to have chronic pain issues requiring IV opioids.  He developed a new fever up to 103.1.  UA was negative.  Blood cultures were done.  Repeat CT abd/pelvis showed decrease pancreatic inflammation without any new fluid collections.  CT chest did not show consolidation or effusions.  He continued to have fevers.  Unfortunately, on the after noon 02/21/22 patient wanted to leave AMA.  In speaking with Rod Can, Rod Can has demonstrated the  ability to understand his medical condition(s) which include acute on chronic pancreatitis, fever of unknown origin.  Brent [name] has demonstrated the ability to appreciate how treatment for acute on chronic pancreatitis, fever of unknown origin will be beneficial.   Brent [name] has also demonstrated the ability to understand and appreciate how refusal of treatement for acute on chronic pancreatitis, fever of unknown origin could result in harm, repeat hospitalization, and possibly death.  Brent [name] demonstrates the ability to reason through the risks and benefits of the proposed treatment.  Finally, Brent [name] is able to clearly communicate his/her choice.

## 2022-02-18 NOTE — Progress Notes (Signed)
PROGRESS NOTE   Brent [name]  GLO:[id number] DOB: 02-Oct-1970 DOA: 02/17/2022 PCP: Lemmie Evens, MD   Chief Complaint  Patient presents with   Abdominal Pain   Level of care: Med-Surg  Brief Admission History:  52 y.o. male with medical history significant of chronic pancreatitis, history of alcohol abuse, depression, anxiety, GERD, MS, and more presents the ED with a chief complaint of abdominal pain.  Patient was abdominal pain started at 1 PM on January 6.  Reports has been progressively worsening since it started.  Normally when he is a pancreatic flare he can keep water down, but this time he has not even been able to keep water down.  He has associated nausea and vomiting.  Nonbloody emesis.  Patient reports that usually he is able to not eat anything for a day and his pain goes away, but that was not the case this time.  He was given multiple doses of fentanyl in the ED, he reports he is 1 improved his pain by 50%, but then the pain will creep back up.  He describes it as a severe pain that feels like an ice knife being stabbed in the hand and then twisted.  He reports he is thrown up more times than he can count.  His last normal meal was on the fifth.  His last normal bowel movement was 2 days ago.  He has not had any diarrhea.  Patient reports subjective fever and chills, but no measured temp.  Patient has no other complaints at this time.   Patient smokes 1.5 packs/day, quit drinking alcohol in 2013 and reports drop since, no illicit drugs.  Patient is vaccinated for COVID and flu.  Patient is full code.   Assessment and Plan: * Acute on chronic pancreatitis (HCC) - Lipase mildly elevated 99 - CT abdomen pelvis shows acute on chronic pancreatitis - Patient reports his last alcoholic drink was in 1884 - Thinks that his pancreatitis is flared because he had too much hamburger yesterday - Trend lipase---coming down with conservative measures - advance to clear liquid diet today -  IV fluids - Pain control - Continue to monitor  Hypoglycemia --starting clears diet --added dextrose to IV fluid  Leukocytosis - Likely related to pancreatitis - No infectious symptoms outside of what can be explained by pancreatitis - WBC down to normal   Tobacco abuse - Smokes a pack and half per day - Counseled on the importance of cessation - Nicotine patch ordered, may need more than 1 patch if his cravings or not controlled with the first 1  Anxiety - Continue Xanax  DVT prophylaxis: SCDs Code Status: full Family Communication:  Disposition: Status is: Inpatient Remains inpatient appropriate because: IV pain management    Consultants:   Procedures:   Antimicrobials:    Subjective: Pt says pain symptoms are starting to get better, says he thinks he can try sips of clears today;   Objective: Vitals:   02/17/22 1344 02/17/22 2100 02/18/22 0510 02/18/22 1000  BP: (!) 135/90 124/86 115/77 117/88  Pulse: 95 81 94 91  Resp: 17 18 16    Temp: 99.3 F (37.4 C) 97.6 F (36.4 C) (!) 97.4 F (36.3 C) 98.7 F (37.1 C)  TempSrc: Oral   Oral  SpO2: 97% 94% 92% 96%  Weight:      Height:        Intake/Output Summary (Last 24 hours) at 02/18/2022 1155 Last data filed at 02/18/2022 0645 Gross per 24 hour  Intake 2145.79 ml  Output --  Net 2145.79 ml   Filed Weights   02/17/22 0039 02/17/22 0558  Weight: 54.4 kg 50 kg   Examination:  General exam: Appears calm and comfortable  Respiratory system: Clear to auscultation. Respiratory effort normal. Cardiovascular system: normal S1 & S2 heard. No JVD, murmurs, rubs, gallops or clicks. No pedal edema. Gastrointestinal system: Abdomen is nondistended, soft and nontender. No organomegaly or masses felt. Normal bowel sounds heard. Central nervous system: Alert and oriented. No focal neurological deficits. Extremities: Symmetric 5 x 5 power. Skin: No rashes, lesions or ulcers. Psychiatry: Judgement and insight appear  normal. Mood & affect appropriate.   Data Reviewed: I have personally reviewed following labs and imaging studies  CBC: Recent Labs  Lab 02/17/22 0022 02/18/22 0701  WBC 12.8* 9.3  NEUTROABS 10.8* 6.9  HGB 17.8* 14.6  HCT 53.0* 44.1  MCV 93.8 95.5  PLT 307 207    Basic Metabolic Panel: Recent Labs  Lab 02/17/22 0022 02/18/22 0701  NA 138 133*  K 3.7 3.3*  CL 101 103  CO2 23 20*  GLUCOSE 111* 56*  BUN 11 10  CREATININE 0.83 0.68  CALCIUM 9.2 8.0*  MG  --  1.8    CBG: No results for input(s): "GLUCAP" in the last 168 hours.  No results found for this or any previous visit (from the past 240 hour(s)).   Radiology Studies: US Abdomen Limited RUQ (LIVER/GB)  Result Date: 02/17/2022 CLINICAL DATA:  Pancreatitis. EXAM: ULTRASOUND ABDOMEN LIMITED RIGHT UPPER QUADRANT COMPARISON:  None Available. FINDINGS: Gallbladder: Surgically absent Common bile duct: Diameter: 5 mm Liver: No focal lesion identified. Within normal limits in parenchymal echogenicity. Portal vein is patent on color Doppler imaging with normal direction of blood flow towards the liver. Other: None. IMPRESSION: 1. The gallbladder is surgically absent. The common bile duct is normal in caliber. 2. No other abnormalities. Electronically Signed   By: Gerome Sam III M.D.   On: 02/17/2022 10:07   CT ABDOMEN PELVIS W CONTRAST  Result Date: 02/17/2022 CLINICAL DATA:  Acute abdominal pain. EXAM: CT ABDOMEN AND PELVIS WITH CONTRAST TECHNIQUE: Multidetector CT imaging of the abdomen and pelvis was performed using the standard protocol following bolus administration of intravenous contrast. RADIATION DOSE REDUCTION: This exam was performed according to the departmental dose-optimization program which includes automated exposure control, adjustment of the mA and/or kV according to patient size and/or use of iterative reconstruction technique. CONTRAST:  19mL OMNIPAQUE IOHEXOL 300 MG/ML  SOLN COMPARISON:  None Available.  FINDINGS: Lower chest: No acute abnormality. Hepatobiliary: No focal liver abnormality is seen. No gallstones, gallbladder wall thickening, or biliary dilatation. Pancreas: Coarse calcifications in the pancreas compatible with sequela of chronic pancreatitis. Similar pancreatic ductal dilation measuring up to 5 mm. There is new hazy edema about the body and tail of the pancreas suggestive of acute on chronic pancreatitis. No organized fluid collection or pseudocysts. Spleen: Unremarkable. Adrenals/Urinary Tract: Normal adrenal glands. No urinary calculi or hydronephrosis. Unremarkable bladder. Stomach/Bowel: Normal caliber large and small bowel. Colonic diverticulosis without diverticulitis. Postoperative change about the stomach. Appendix not identified. Vascular/Lymphatic: Aortic atherosclerosis. Patent portal vein. 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 No enlarged abdominal or pelvic lymph nodes. Reproductive: Mild enlargement of the prostate. Other: No free intraperitoneal air. Musculoskeletal: No acute osseous abnormality. Chronic compression deformity of L2. Advanced endplate degenerative change at L4-L5. IMPRESSION: Acute on chronic pancreatitis. Aortic Atherosclerosis (ICD10-I70.0). Electronically Signed   By: Joselyn Glassman  Stutzman M.D.   On: 02/17/2022 04:08    Scheduled Meds:  heparin  5,000 Units Subcutaneous Q8H   nicotine  21 mg Transdermal Daily   pantoprazole  40 mg Oral Daily   Continuous Infusions:  dextrose 5 % and 0.9% NaCl     magnesium sulfate bolus IVPB     potassium chloride       LOS: 0 days   Time spent: 35 mins  Phyllis Abelson Laural Benes, MD How to contact the [facility] Attending or Consulting provider 7A - 7P or covering provider during after hours 7P -7A, for this patient?  Check the care team in [facility] and look for a) attending/consulting [facility] provider listed and b) the [facility] team listed Log into [email or web address] and use [facility]'s universal password to  access. If you do not have the password, please contact the hospital operator. Locate the [facility] provider you are looking for under [facility] and page to a number that you can be directly reached. If you still have difficulty reaching the provider, please page the [facility] (Director on Call) for the Hospitalists listed on amion for assistance.  02/18/2022, 11:55 AM

## 2022-02-18 NOTE — Assessment & Plan Note (Signed)
--  starting clears diet --added dextrose to IV fluid

## 2022-02-19 DIAGNOSIS — D72829 Elevated white blood cell count, unspecified: Secondary | ICD-10-CM | POA: Diagnosis not present

## 2022-02-19 DIAGNOSIS — K859 Acute pancreatitis without necrosis or infection, unspecified: Secondary | ICD-10-CM | POA: Diagnosis not present

## 2022-02-19 DIAGNOSIS — E162 Hypoglycemia, unspecified: Secondary | ICD-10-CM | POA: Diagnosis not present

## 2022-02-19 DIAGNOSIS — Z720 Tobacco use: Secondary | ICD-10-CM | POA: Diagnosis not present

## 2022-02-19 LAB — BASIC METABOLIC PANEL
Anion gap: 8 (ref 5–15)
BUN: 5 mg/dL — ABNORMAL LOW (ref 6–20)
CO2: 24 mmol/L (ref 22–32)
Calcium: 8 mg/dL — ABNORMAL LOW (ref 8.9–10.3)
Chloride: 103 mmol/L (ref 98–111)
Creatinine, Ser: 0.59 mg/dL — ABNORMAL LOW (ref 0.61–1.24)
GFR, Estimated: >60 mL/min (ref >60)
Glucose, Bld: 102 mg/dL — ABNORMAL HIGH (ref 70–99)
Potassium: 3.1 mmol/L — ABNORMAL LOW (ref 3.5–5.1)
Sodium: 135 mmol/L (ref 135–145)

## 2022-02-19 LAB — MAGNESIUM: Magnesium: 2.2 mg/dL (ref 1.7–2.4)

## 2022-02-19 LAB — LIPASE, BLOOD: Lipase: 164 U/L — ABNORMAL HIGH (ref 11–51)

## 2022-02-19 LAB — GLUCOSE, CAPILLARY: Glucose-Capillary: 109 mg/dL — ABNORMAL HIGH (ref 70–99)

## 2022-02-19 MED ORDER — POTASSIUM CHLORIDE 10 MEQ/100ML IV SOLN
10.0000 meq | INTRAVENOUS | Status: AC
  Administered 2022-02-19 (×3): 10 meq via INTRAVENOUS
  Filled 2022-02-19 (×3): qty 100

## 2022-02-19 MED ORDER — POTASSIUM CHLORIDE 10 MEQ/100ML IV SOLN
10.0000 meq | Freq: Once | INTRAVENOUS | Status: AC
  Administered 2022-02-19: 10 meq via INTRAVENOUS
  Filled 2022-02-19: qty 100

## 2022-02-19 MED ORDER — GABAPENTIN 300 MG PO CAPS
600.0000 mg | ORAL_CAPSULE | Freq: Three times a day (TID) | ORAL | Status: DC | PRN
  Administered 2022-02-19 – 2022-02-21 (×6): 600 mg via ORAL
  Filled 2022-02-19 (×6): qty 2

## 2022-02-19 MED ORDER — ALPRAZOLAM 0.5 MG PO TABS
0.5000 mg | ORAL_TABLET | Freq: Three times a day (TID) | ORAL | Status: DC | PRN
  Administered 2022-02-19 – 2022-02-21 (×4): 0.5 mg via ORAL
  Filled 2022-02-19 (×4): qty 1

## 2022-02-19 NOTE — Assessment & Plan Note (Signed)
--  IV replacement given, recheck in AM 

## 2022-02-19 NOTE — Progress Notes (Signed)
PROGRESS NOTE   Brent [name]  WUX:[id number] DOB: 04/21/1970 DOA: 02/17/2022 PCP: Gareth Morgan, MD   Chief Complaint  Patient presents with   Abdominal Pain   Level of care: Med-Surg  Brief Admission History:  52 y.o. male with medical history significant of chronic pancreatitis, history of alcohol abuse, depression, anxiety, GERD, MS, and more presents the ED with a chief complaint of abdominal pain.  Patient was abdominal pain started at 1 PM on January 6.  Reports has been progressively worsening since it started.  Normally when he is a pancreatic flare he can keep water down, but this time he has not even been able to keep water down.  He has associated nausea and vomiting.  Nonbloody emesis.  Patient reports that usually he is able to not eat anything for a day and his pain goes away, but that was not the case this time.  He was given multiple doses of fentanyl in the ED, he reports he is 1 improved his pain by 50%, but then the pain will creep back up.  He describes it as a severe pain that feels like an ice knife being stabbed in the hand and then twisted.  He reports he is thrown up more times than he can count.  His last normal meal was on the fifth.  His last normal bowel movement was 2 days ago.  He has not had any diarrhea.  Patient reports subjective fever and chills, but no measured temp.  Patient has no other complaints at this time.   Patient smokes 1.5 packs/day, quit drinking alcohol in 2013 and reports drop since, no illicit drugs.  Patient is vaccinated for COVID and flu.  Patient is full code.   Assessment and Plan: * Acute on chronic pancreatitis (HCC) - Lipase remains elevated, clinically he seems to be improving - CT abdomen pelvis shows acute on chronic pancreatitis - Patient reports his last alcoholic drink was in 2013 - Thinks that his pancreatitis is flared because he had too much hamburger 1 day prior to arrival - Trend lipase---coming down with conservative  measures - advance to full liquid diet today per patient request - IV fluids - Pain control (careful with escalation of doses) - Continue to monitor, clinically he is improving and I told him could likely go home in next 1-2 days  Hypoglycemia --resolved after diet started  --continue the added dextrose to IV fluid for now for safety CBG (last 3)  Recent Labs    02/18/22 1620 02/18/22 2136 02/19/22 0744  GLUCAP 92 122* 109*     Leukocytosis - Likely related to pancreatitis - No infectious symptoms outside of what can be explained by pancreatitis - WBC down to normal   MS (multiple sclerosis) (HCC) --I asked pharm D and pharm tech to please clarify correct home dose of gabapentin, he is now reporting he takes 600 mg QID vs the 300 mg dose  Hypokalemia -- IV replacement given, recheck in AM   Tobacco abuse - Smokes a pack and half per day - Counseled on the importance of cessation - Nicotine patch ordered, may need more than 1 patch if his cravings or not controlled with the first 1  Anxiety - Continue Xanax (dose reduced in hospital while on IV opioids) 0.5 mg TID prn  DVT prophylaxis: SCDs Code Status: full Family Communication:  Disposition: Status is: Inpatient Remains inpatient appropriate because: IV pain management    Consultants:   Procedures:   Antimicrobials:  Subjective: Pt says pain symptoms are starting to get better, says he thinks he can try sips of clears today;   Objective: Vitals:   02/18/22 1000 02/18/22 2134 02/19/22 0344 02/19/22 0744  BP: 117/88 121/82 122/86 120/85  Pulse: 91 75 78 76  Resp:  18 18 18   Temp: 98.7 F (37.1 C) (!) 97.3 F (36.3 C) (!) 97.3 F (36.3 C) 99.1 F (37.3 C)  TempSrc: Oral   Oral  SpO2: 96% 96% 95% 94%  Weight:      Height:        Intake/Output Summary (Last 24 hours) at 02/19/2022 1232 Last data filed at 02/19/2022 1100 Gross per 24 hour  Intake 699.49 ml  Output --  Net 699.49 ml   Filed Weights    02/17/22 0039 02/17/22 0558  Weight: 54.4 kg 50 kg   Examination:  General exam: severe emaciation appearance; calm and comfortable  Respiratory system: Clear to auscultation. Respiratory effort normal. Cardiovascular system: normal S1 & S2 heard. No JVD, murmurs, rubs, gallops or clicks. No pedal edema. Gastrointestinal system: Abdomen is nondistended, soft and nontender. No organomegaly or masses felt. Normal bowel sounds heard. Central nervous system: Alert and oriented. No focal neurological deficits. Extremities: Symmetric 5 x 5 power. Skin: No rashes, lesions or ulcers. Psychiatry: Judgement and insight appear normal. Mood & affect appropriate.   Data Reviewed: I have personally reviewed following labs and imaging studies  CBC: Recent Labs  Lab 02/17/22 0022 02/18/22 0701  WBC 12.8* 9.3  NEUTROABS 10.8* 6.9  HGB 17.8* 14.6  HCT 53.0* 44.1  MCV 93.8 95.5  PLT 307 035    Basic Metabolic Panel: Recent Labs  Lab 02/17/22 0022 02/18/22 0701 02/19/22 0341 02/19/22 0753  NA 138 133* 135  --   K 3.7 3.3* 3.1*  --   CL 101 103 103  --   CO2 23 20* 24  --   GLUCOSE 111* 56* 102*  --   BUN 11 10 5*  --   CREATININE 0.83 0.68 0.59*  --   CALCIUM 9.2 8.0* 8.0*  --   MG  --  1.8  --  2.2    CBG: Recent Labs  Lab 02/18/22 1400 02/18/22 1620 02/18/22 2136 02/19/22 0744  GLUCAP 124* 92 122* 109*    No results found for this or any previous visit (from the past 240 hour(s)).   Radiology Studies: No results found.  Scheduled Meds:  heparin  5,000 Units Subcutaneous Q8H   nicotine  21 mg Transdermal Daily   pantoprazole  40 mg Oral Daily   Continuous Infusions:  dextrose 5 % and 0.9% NaCl 75 mL/hr at 02/19/22 1136   potassium chloride 10 mEq (02/19/22 1138)     LOS: 1 day   Time spent: 35 mins  Damascus Feldpausch Wynetta Emery, MD How to contact the [facility] Attending or Consulting provider [place] or covering provider during after hours [place], for this patient?  Check  the care team in [facility] and look for a) attending/consulting [facility] provider listed and b) the [facility] team listed Log into [email or web address] and use [place]'s universal password to access. If you do not have the password, please contact the hospital operator. Locate the [facility] provider you are looking for under [facility] and page to a number that you can be directly reached. If you still have difficulty reaching the provider, please page the [facility] (Director on Call) for the Hospitalists listed on amion for assistance.  02/19/2022, 12:32  PM

## 2022-02-19 NOTE — Assessment & Plan Note (Signed)
--  I asked pharm D and pharm tech to please clarify correct home dose of gabapentin, he is now reporting he takes 600 mg QID vs the 300 mg dose

## 2022-02-20 DIAGNOSIS — K861 Other chronic pancreatitis: Secondary | ICD-10-CM | POA: Diagnosis not present

## 2022-02-20 DIAGNOSIS — E876 Hypokalemia: Secondary | ICD-10-CM | POA: Diagnosis not present

## 2022-02-20 DIAGNOSIS — K859 Acute pancreatitis without necrosis or infection, unspecified: Secondary | ICD-10-CM | POA: Diagnosis not present

## 2022-02-20 DIAGNOSIS — Z720 Tobacco use: Secondary | ICD-10-CM | POA: Diagnosis not present

## 2022-02-20 LAB — URINALYSIS, COMPLETE (UACMP) WITH MICROSCOPIC
Bacteria, UA: NONE SEEN
Bilirubin Urine: NEGATIVE
Glucose, UA: NEGATIVE mg/dL
Hgb urine dipstick: NEGATIVE
Ketones, ur: NEGATIVE mg/dL
Leukocytes,Ua: NEGATIVE
Nitrite: NEGATIVE
Protein, ur: NEGATIVE mg/dL
Specific Gravity, Urine: 1.004 — ABNORMAL LOW (ref 1.005–1.030)
pH: 7 (ref 5.0–8.0)

## 2022-02-20 LAB — BASIC METABOLIC PANEL
Anion gap: 10 (ref 5–15)
BUN: <5 mg/dL — ABNORMAL LOW (ref 6–20)
CO2: 23 mmol/L (ref 22–32)
Calcium: 8.4 mg/dL — ABNORMAL LOW (ref 8.9–10.3)
Chloride: 103 mmol/L (ref 98–111)
Creatinine, Ser: 0.63 mg/dL (ref 0.61–1.24)
GFR, Estimated: >60 mL/min (ref >60)
Glucose, Bld: 99 mg/dL (ref 70–99)
Potassium: 3.1 mmol/L — ABNORMAL LOW (ref 3.5–5.1)
Sodium: 136 mmol/L (ref 135–145)

## 2022-02-20 LAB — LIPASE, BLOOD: Lipase: 128 U/L — ABNORMAL HIGH (ref 11–51)

## 2022-02-20 MED ORDER — POTASSIUM CHLORIDE CRYS ER 20 MEQ PO TBCR
40.0000 meq | EXTENDED_RELEASE_TABLET | Freq: Once | ORAL | Status: AC
  Administered 2022-02-20: 40 meq via ORAL
  Filled 2022-02-20: qty 2

## 2022-02-20 NOTE — Progress Notes (Signed)
   02/20/22 1306  Assess: MEWS Score  Temp (!) 102.9 F (39.4 C)  BP 118/78  MAP (mmHg) 91  Pulse Rate (!) 101  SpO2 92 %  O2 Device Room Air  Assess: MEWS Score  MEWS Temp 2  MEWS Systolic 0  MEWS Pulse 1  MEWS RR 0  MEWS LOC 0  MEWS Score 3  MEWS Score Color Yellow  Assess: if the MEWS score is Yellow or Red  Were vital signs taken at a resting state? Yes  Focused Assessment No change from prior assessment  Does the patient meet 2 or more of the SIRS criteria? No  MEWS guidelines implemented *See Row Information* No, vital signs rechecked  Assess: SIRS CRITERIA  SIRS Temperature  1  SIRS Pulse 1  SIRS Respirations  0  SIRS WBC 0  SIRS Score Sum  2

## 2022-02-20 NOTE — Progress Notes (Signed)
PROGRESS NOTE  Brent [name] [id number] DOB: January 03, 1971 DOA: 02/17/2022 PCP: Gareth Morgan, MD  Brief History:  52 y.o. male with medical history significant of chronic pancreatitis, history of alcohol abuse, depression, anxiety, GERD, MS, and more presents the ED with a chief complaint of abdominal pain.  Patient was abdominal pain started at 1 PM on January 6.  Reports has been progressively worsening since it started.  Normally when he is a pancreatic flare he can keep water down, but this time he has not even been able to keep water down.  He has associated nausea and vomiting.  Nonbloody emesis.  Patient reports that usually he is able to not eat anything for a day and his pain goes away, but that was not the case this time.  He was given multiple doses of fentanyl in the ED, he reports he is 1 improved his pain by 50%, but then the pain will creep back up.  He describes it as a severe pain that feels like an ice knife being stabbed in the hand and then twisted.  He reports he is thrown up more times than he can count.  His last normal meal was on the fifth.  His last normal bowel movement was 2 days ago.  He has not had any diarrhea.  Patient reports subjective fever and chills, but no measured temp.  Patient has no other complaints at this time.   Patient smokes 1.5 packs/day, quit drinking alcohol in 2013 and reports drop since, no illicit drugs.  Patient is vaccinated for COVID and flu.  Patient is full code.   Assessment/Plan:  * Acute on chronic pancreatitis (HCC) - Lipase remains elevated, clinically he seems to be improving - CT abdomen pelvis shows acute on chronic pancreatitis - Patient reports his last alcoholic drink was in 2013 - Thinks that his pancreatitis is flared because he had too much hamburger 1 day prior to arrival - advance to full liquid diet>>soft diet on 1/10 - IV fluids - judicious opioids  Fever -had temp 102.9 02/20/22 -blood  cultures -UA/urine culture -repeat CT abd if fevers persist   Hypoglycemia --resolved after diet started  --continue the added dextrose to IV fluid for now   Leukocytosis - Likely related to pancreatitis - No infectious symptoms outside of what can be explained by pancreatitis - WBC down to normal    MS (multiple sclerosis) (HCC) --saw Dr. Despina Arias 12/05/21--holding off disease modifying drugs as he was not certain at that time pt truly had MS.   Hypokalemia -- IV replacement given -give po replacement now he's tolerating po   Tobacco abuse - Smokes a pack and half per day - Counseled on the importance of cessation - Nicotine patch ordered   Anxiety - Continue Xanax (dose reduced in hospital while on IV opioids) 0.5 mg TID prn -PDMP reviewed--xanax 1 mg, #120 monthly, last refill 01/21/22 -norco 10/325 #90, monthly, last refill 01/25/22 -zolpidem 10 mg, #30, monthly, last refill 01/19/22          Subjective: Pt states abd pain is 50-70% better.  Denies f/,c, cp, sob, n/v/d.  Objective: Vitals:   02/19/22 1959 02/20/22 0443 02/20/22 1306 02/20/22 1512  BP: (!) 118/92 (!) 127/91 118/78 (!) 115/94  Pulse: 91 90 (!) 101 97  Resp: 19 19    Temp: 98.5 F (36.9 C) 99.4 F (37.4 C) (!) 102.9 F (39.4 C) (!) 100.9 F (38.3 C)  TempSrc:   Oral Oral  SpO2: 96% 98% 92% 92%  Weight:      Height:        Intake/Output Summary (Last 24 hours) at 02/20/2022 1736 Last data filed at 02/20/2022 1513 Gross per 24 hour  Intake 3035.35 ml  Output --  Net 3035.35 ml   Weight change:  Exam:  General:  Pt is alert, follows commands appropriately, not in acute distress HEENT: No icterus, No thrush, No neck mass, [place]/AT Cardiovascular: RRR, S1/S2, no rubs, no gallops Respiratory: CTA bilaterally, no wheezing, no crackles, no rhonchi Abdomen: Soft/+BS, epigastric tender, non distended, no guarding Extremities: No edema, No lymphangitis, No petechiae, No rashes, no  synovitis   Data Reviewed: I have personally reviewed following labs and imaging studies Basic Metabolic Panel: Recent Labs  Lab 02/17/22 0022 02/18/22 0701 02/19/22 0341 02/19/22 0753 02/20/22 0345  NA 138 133* 135  --  136  K 3.7 3.3* 3.1*  --  3.1*  CL 101 103 103  --  103  CO2 23 20* 24  --  23  GLUCOSE 111* 56* 102*  --  99  BUN 11 10 5*  --  <5*  CREATININE 0.83 0.68 0.59*  --  0.63  CALCIUM 9.2 8.0* 8.0*  --  8.4*  MG  --  1.8  --  2.2  --    Liver Function Tests: Recent Labs  Lab 02/18/22 0701  AST 16  ALT 11  ALKPHOS 72  BILITOT 1.5*  PROT 5.7*  ALBUMIN 3.0*   Recent Labs  Lab 02/17/22 0022 02/18/22 0701 02/19/22 0341 02/20/22 0345  LIPASE 199* 127* 164* 128*   No results for input(s): "AMMONIA" in the last 168 hours. Coagulation Profile: No results for input(s): "INR", "PROTIME" in the last 168 hours. CBC: Recent Labs  Lab 02/17/22 0022 02/18/22 0701  WBC 12.8* 9.3  NEUTROABS 10.8* 6.9  HGB 17.8* 14.6  HCT 53.0* 44.1  MCV 93.8 95.5  PLT 307 207   Cardiac Enzymes: No results for input(s): "CKTOTAL", "CKMB", "CKMBINDEX", "TROPONINI" in the last 168 hours. BNP: Invalid input(s): "POCBNP" CBG: Recent Labs  Lab 02/18/22 1400 02/18/22 1620 02/18/22 2136 02/19/22 0744  GLUCAP 124* 92 122* 109*   HbA1C: No results for input(s): "HGBA1C" in the last 72 hours. Urine analysis:    Component Value Date/Time   COLORURINE YELLOW 09/15/2019 1130   APPEARANCEUR CLEAR 09/15/2019 1130   LABSPEC 1.006 09/15/2019 1130   PHURINE 7.0 09/15/2019 1130   GLUCOSEU NEGATIVE 09/15/2019 1130   HGBUR NEGATIVE 09/15/2019 1130   BILIRUBINUR NEGATIVE 09/15/2019 1130   KETONESUR 5 (A) 09/15/2019 1130   PROTEINUR NEGATIVE 09/15/2019 1130   UROBILINOGEN 0.2 11/06/2014 2345   NITRITE NEGATIVE 09/15/2019 1130   LEUKOCYTESUR NEGATIVE 09/15/2019 1130   Sepsis Labs: @LABRCNTIP (procalcitonin:4,lacticidven:4) ) Recent Results (from the past 240 hour(s))   Culture, blood (Routine X 2) w Reflex to ID Panel     Status: None (Preliminary result)   Collection Time: 02/20/22  4:02 PM   Specimen: BLOOD RIGHT HAND  Result Value Ref Range Status   Specimen Description BLOOD RIGHT HAND  Final   Special Requests   Final    BOTTLES DRAWN AEROBIC AND ANAEROBIC Blood Culture adequate volume Performed at [facility], [address], [place], [place] [address]    Culture PENDING  Incomplete   Report Status PENDING  Incomplete  Culture, blood (Routine X 2) w Reflex to ID Panel     Status: None (Preliminary result)  Collection Time: 02/20/22  4:09 PM   Specimen: BLOOD LEFT HAND  Result Value Ref Range Status   Specimen Description BLOOD LEFT HAND  Final   Special Requests   Final    BOTTLES DRAWN AEROBIC AND ANAEROBIC Blood Culture adequate volume Performed at [facility], [address], [place], [place] [address]    Culture PENDING  Incomplete   Report Status PENDING  Incomplete     Scheduled Meds:  heparin  5,000 Units Subcutaneous Q8H   nicotine  21 mg Transdermal Daily   pantoprazole  40 mg Oral Daily   Continuous Infusions:  dextrose 5 % and 0.9% NaCl 75 mL/hr at 02/20/22 1513    Procedures/Studies: US Abdomen Limited RUQ (LIVER/GB)  Result Date: 02/17/2022 CLINICAL DATA:  Pancreatitis. EXAM: ULTRASOUND ABDOMEN LIMITED RIGHT UPPER QUADRANT COMPARISON:  None Available. FINDINGS: Gallbladder: Surgically absent Common bile duct: Diameter: 5 mm Liver: No focal lesion identified. Within normal limits in parenchymal echogenicity. Portal vein is patent on color Doppler imaging with normal direction of blood flow towards the liver. Other: None. IMPRESSION: 1. The gallbladder is surgically absent. The common bile duct is normal in caliber. 2. No other abnormalities. Electronically Signed   By: Dorise Bullion III M.D.   On: 02/17/2022 10:07   CT ABDOMEN PELVIS W CONTRAST  Result Date: 02/17/2022 CLINICAL DATA:  Acute abdominal pain. EXAM: CT  ABDOMEN AND PELVIS WITH CONTRAST TECHNIQUE: Multidetector CT imaging of the abdomen and pelvis was performed using the standard protocol following bolus administration of intravenous contrast. RADIATION DOSE REDUCTION: This exam was performed according to the departmental dose-optimization program which includes automated exposure control, adjustment of the mA and/or kV according to patient size and/or use of iterative reconstruction technique. CONTRAST:  64mL OMNIPAQUE IOHEXOL 300 MG/ML  SOLN COMPARISON:  None Available. FINDINGS: Lower chest: No acute abnormality. Hepatobiliary: No focal liver abnormality is seen. No gallstones, gallbladder wall thickening, or biliary dilatation. Pancreas: Coarse calcifications in the pancreas compatible with sequela of chronic pancreatitis. Similar pancreatic ductal dilation measuring up to 5 mm. There is new hazy edema about the body and tail of the pancreas suggestive of acute on chronic pancreatitis. No organized fluid collection or pseudocysts. Spleen: Unremarkable. Adrenals/Urinary Tract: Normal adrenal glands. No urinary calculi or hydronephrosis. Unremarkable bladder. Stomach/Bowel: Normal caliber large and small bowel. Colonic diverticulosis without diverticulitis. Postoperative change about the stomach. Appendix not identified. Vascular/Lymphatic: Aortic atherosclerosis. Patent portal vein. 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 No enlarged abdominal or pelvic lymph nodes. Reproductive: Mild enlargement of the prostate. Other: No free intraperitoneal air. Musculoskeletal: No acute osseous abnormality. Chronic compression deformity of L2. Advanced endplate degenerative change at L4-L5. IMPRESSION: Acute on chronic pancreatitis. Aortic Atherosclerosis (ICD10-I70.0). Electronically Signed   By: Placido Sou M.D.   On: 02/17/2022 04:08    Orson Eva, DO  [facility]  If 7PM-7AM, please contact  night-coverage [email or web address] Password [facility] 02/20/2022, 5:36 PM   LOS: 2 days

## 2022-02-20 NOTE — Progress Notes (Signed)
Pt states that his ABD pain is better controlled at this time, especially since he has been passing more gas.  Denies nausea.  BM reported yesterday.  Abd soft, tender, BSA.  Pt reports his MS pain has worsened without his gabapentin.  Gave pt prn dose, reviewed with pharmacy regarding pt reported current dose and info in MD note, then with provider.  Prn dose increased, and given at hs.  Pt ambulatory in room.  Tolerating oral fluids at this time.

## 2022-02-20 NOTE — Progress Notes (Signed)
Pt has open area from feeding tube that he has had since 2020 but fell out 3 months ago.  Pt reports that this is draining intermittently and causing some irritation.  Skin currently clean, dry around drainage site.  Per pt request, covered with gauze and tegaderm.  Will continue to monitor.

## 2022-02-21 ENCOUNTER — Inpatient Hospital Stay (HOSPITAL_COMMUNITY): Payer: 59

## 2022-02-21 DIAGNOSIS — K859 Acute pancreatitis without necrosis or infection, unspecified: Secondary | ICD-10-CM | POA: Diagnosis not present

## 2022-02-21 DIAGNOSIS — R509 Fever, unspecified: Secondary | ICD-10-CM

## 2022-02-21 DIAGNOSIS — Z720 Tobacco use: Secondary | ICD-10-CM | POA: Diagnosis not present

## 2022-02-21 DIAGNOSIS — K861 Other chronic pancreatitis: Secondary | ICD-10-CM | POA: Diagnosis not present

## 2022-02-21 LAB — COMPREHENSIVE METABOLIC PANEL
ALT: 17 U/L (ref 0–44)
AST: 22 U/L (ref 15–41)
Albumin: 3 g/dL — ABNORMAL LOW (ref 3.5–5.0)
Alkaline Phosphatase: 67 U/L (ref 38–126)
Anion gap: 8 (ref 5–15)
BUN: <5 mg/dL — ABNORMAL LOW (ref 6–20)
CO2: 24 mmol/L (ref 22–32)
Calcium: 8.2 mg/dL — ABNORMAL LOW (ref 8.9–10.3)
Chloride: 102 mmol/L (ref 98–111)
Creatinine, Ser: 0.72 mg/dL (ref 0.61–1.24)
GFR, Estimated: >60 mL/min (ref >60)
Glucose, Bld: 108 mg/dL — ABNORMAL HIGH (ref 70–99)
Potassium: 3.3 mmol/L — ABNORMAL LOW (ref 3.5–5.1)
Sodium: 134 mmol/L — ABNORMAL LOW (ref 135–145)
Total Bilirubin: 0.7 mg/dL (ref 0.3–1.2)
Total Protein: 6 g/dL — ABNORMAL LOW (ref 6.5–8.1)

## 2022-02-21 LAB — CBC
HCT: 43.7 % (ref 39.0–52.0)
Hemoglobin: 14.6 g/dL (ref 13.0–17.0)
MCH: 31 pg (ref 26.0–34.0)
MCHC: 33.4 g/dL (ref 30.0–36.0)
MCV: 92.8 fL (ref 80.0–100.0)
Platelets: 154 10*3/uL (ref 150–400)
RBC: 4.71 MIL/uL (ref 4.22–5.81)
RDW: 13.2 % (ref 11.5–15.5)
WBC: 4.1 10*3/uL (ref 4.0–10.5)
nRBC: 0 % (ref 0.0–0.2)

## 2022-02-21 LAB — PROCALCITONIN: Procalcitonin: <0.1 ng/mL

## 2022-02-21 LAB — MAGNESIUM: Magnesium: 1.8 mg/dL (ref 1.7–2.4)

## 2022-02-21 LAB — LIPASE, BLOOD: Lipase: 179 U/L — ABNORMAL HIGH (ref 11–51)

## 2022-02-21 MED ORDER — IOHEXOL 9 MG/ML PO SOLN
ORAL | Status: AC
  Filled 2022-02-21: qty 500

## 2022-02-21 MED ORDER — IOHEXOL 300 MG/ML  SOLN
80.0000 mL | Freq: Once | INTRAMUSCULAR | Status: AC | PRN
  Administered 2022-02-21: 80 mL via INTRAVENOUS

## 2022-02-21 MED ORDER — POTASSIUM CHLORIDE CRYS ER 20 MEQ PO TBCR
40.0000 meq | EXTENDED_RELEASE_TABLET | Freq: Once | ORAL | Status: AC
  Administered 2022-02-21: 40 meq via ORAL
  Filled 2022-02-21: qty 2

## 2022-02-21 NOTE — Progress Notes (Addendum)
Pt states he wants to leave AMA. He states he isnt getting the care or the pain medications he is suppose to have. Notified MD. IV removed. AMA formed signed and put in chart.

## 2022-02-21 NOTE — Discharge Summary (Signed)
Physician Discharge Summary   Patient: Brent [name] MRN: [id number] DOB: 25-Jan-1971  Admit date:     02/17/2022  Discharge date: 02/21/22  Discharge Physician: Brent [name]   PCP: Brent Evens, MD  LEAVING [place] [facility] Course: 52 y.o. male with medical history significant of chronic pancreatitis, history of alcohol abuse, depression, anxiety, GERD, MS, and more presents the ED with a chief complaint of abdominal pain.  Patient was abdominal pain started at 1 PM on January 6.  Reports [name] been progressively worsening since it started.  Normally when he is a pancreatic flare he [name] keep water down, but this time he [name] not even been able to keep water down.  He [name] associated nausea and vomiting.  Nonbloody emesis.  Patient reports that usually he is able to not eat anything for a day and his pain goes away, but that was not the case this time.  He was given multiple doses of fentanyl in the ED, he reports he is 1 improved his pain by 50%, but then the pain will creep back up.  He describes it as a severe pain that feels like an ice knife being stabbed in the hand and then twisted.  He reports he is thrown up more times than he [name] count.  His last normal meal was on the fifth.  His last normal bowel movement was 2 days ago.  He [name] not had any diarrhea.  Patient reports subjective fever and chills, but no measured temp.  Patient [name] no other complaints at this time.   Patient smokes 1.5 packs/day, quit drinking alcohol in 2013 and reports drop since, no illicit drugs.  Patient is vaccinated for COVID and flu.  Patient is full code.  Patient was placed on bowel rest and IV opioids and IVF.  He had clinical improvement from a pancreatitis standpoint and his diet was advanced.  He continued to have chronic pain issues requiring IV opioids.  He developed a new fever up to 103.1.  UA was negative.  Blood cultures were done.  Repeat CT abd/pelvis showed decrease pancreatic inflammation  without any new fluid collections.  CT chest did not show consolidation or effusions.  He continued to have fevers.  Unfortunately, on the after noon 02/21/22 patient wanted to leave AMA.  In speaking with Brent [name], Brent [name] demonstrated the ability to understand his medical condition(s) which include acute on chronic pancreatitis, fever of unknown origin.  Brent [name] [name] demonstrated the ability to appreciate how treatment for acute on chronic pancreatitis, fever of unknown origin will be beneficial.   Brent [name] [name] also demonstrated the ability to understand and appreciate how refusal of treatement for acute on chronic pancreatitis, fever of unknown origin could result in harm, repeat hospitalization, and possibly death.  Brent [name] demonstrates the ability to reason through the risks and benefits of the proposed treatment.  Finally, Brent [name] is able to clearly communicate his/her choice.   Assessment and Plan:  * Acute on chronic pancreatitis (HCC) - Lipase remains elevated, clinically he seems to be improving - CT abdomen pelvis shows acute on chronic pancreatitis - Patient reports his last alcoholic drink was in 4174 - Thinks that his pancreatitis is flared because he had too much hamburger 1 day prior to arrival - advance to full liquid diet>>soft diet on 1/10 - IV fluids - judicious opioids   Fever -had temp 102.9 02/20/22 -fever persists on  02/21/22 -blood cultures-neg to date -UA--neg for pyuria -repeat CT abd due to persistent fever--decrease pancreatic stranding;  bibasilar opacities -PCT <0.10   Hypoglycemia --resolved after diet started  --continue the added dextrose to IV fluid for now    Leukocytosis - Likely related to pancreatitis - No infectious symptoms outside of what [name] be explained by pancreatitis - WBC down to normal    MS (multiple sclerosis) (HCC) --saw Brent [name] 12/05/21--holding off disease modifying drugs as he was  not certain at that time pt truly had MS.   Hypokalemia -- IV replacement given -give po replacement now he's tolerating po   Tobacco abuse - Smokes a pack and half per day - Counseled on the importance of cessation - Nicotine patch ordered   Anxiety - Continue Xanax (dose reduced in hospital while on IV opioids) 0.5 mg TID prn -PDMP reviewed--xanax 1 mg, #120 monthly, last refill 01/21/22 -norco 10/325 #90, monthly, last refill 01/25/22 -zolpidem 10 mg, #30, monthly, last refill 01/19/22         Consultants: none Procedures performed: none  Disposition: Against Medical Advice  DISCHARGE MEDICATION: Allergies as of 02/21/2022   No Active Allergies      Medication List     TAKE these medications    albuterol 108 (90 Base) MCG/ACT inhaler Commonly known as: VENTOLIN HFA Inhale 1-2 puffs into the lungs every 6 (six) hours as needed for wheezing or shortness of breath.   ALPRAZolam 1 MG tablet Commonly known as: XANAX Take 1 tablet (1 mg total) by mouth 2 (two) times daily as needed for anxiety. What changed: when to take this   cetirizine 10 MG tablet Commonly known as: ZYRTEC Take 10 mg by mouth daily.   Creon 3000-9500 units Cpep Generic drug: Pancrelipase (Lip-Prot-Amyl) Take 1 capsule by mouth 3 (three) times daily with meals.   cyanocobalamin 1000 MCG tablet Commonly known as: VITAMIN B12 Take 2,000 mcg by mouth daily.   docusate sodium 100 MG capsule Commonly known as: COLACE Take 100 mg by mouth daily as needed for mild constipation.   gabapentin 600 MG tablet Commonly known as: NEURONTIN Take 600 mg by mouth 4 (four) times daily.   HYDROcodone-acetaminophen 10-325 MG tablet Commonly known as: NORCO Take 1 tablet by mouth every 6 (six) hours as needed for severe pain. For pain   ketorolac 0.5 % ophthalmic solution Commonly known as: ACULAR Place 2 drops into both eyes 4 (four) times daily.   omeprazole 40 MG capsule Commonly known as:  PRILOSEC Take 40 mg by mouth at bedtime.   ondansetron 4 MG tablet Commonly known as: ZOFRAN Take 4 mg by mouth every 8 (eight) hours as needed for nausea.   prednisoLONE acetate 1 % ophthalmic suspension Commonly known as: PRED FORTE Place 2 drops into both eyes 2 (two) times daily.   Symbicort 80-4.5 MCG/ACT inhaler Generic drug: budesonide-formoterol Inhale 1 puff into the lungs 2 (two) times daily.   Vitamin D High Potency 25 MCG (1000 UT) capsule Generic drug: Cholecalciferol Take 1,000 Units by mouth daily.   zolpidem 10 MG tablet Commonly known as: AMBIEN Take 10 mg by mouth at bedtime as needed for sleep.        Discharge Exam: Filed Weights   02/17/22 0039 02/17/22 0558  Weight: 54.4 kg 50 kg   HEENT:  [place]/AT, No thrush, no icterus CV:  RRR, no rub, no S3, no S4 Lung:  diminished BS.  Bibasilar crackles.  Abd:  soft/+BS, epigastric  tender Ext:  No edema, no lymphangitis, no synovitis, no rash   Condition at discharge: stable  The results of significant diagnostics from this hospitalization (including imaging, microbiology, ancillary and laboratory) are listed below for reference.   Imaging Studies: CT CHEST ABDOMEN PELVIS W CONTRAST  Result Date: 02/21/2022 CLINICAL DATA:  Pancreatitis.  Pain.  Fever EXAM: CT CHEST, ABDOMEN, AND PELVIS WITH CONTRAST TECHNIQUE: Multidetector CT imaging of the chest, abdomen and pelvis was performed following the standard protocol during bolus administration of intravenous contrast. RADIATION DOSE REDUCTION: This exam was performed according to the departmental dose-optimization program which includes automated exposure control, adjustment of the mA and/or kV according to patient size and/or use of iterative reconstruction technique. CONTRAST:  42mL OMNIPAQUE IOHEXOL 300 MG/ML  SOLN COMPARISON:  CT 02/17/2022. Older CT scans as well. Chest x-ray 02/24/2018 FINDINGS: CT CHEST FINDINGS Cardiovascular: The heart is nonenlarged. No  pericardial effusion. Normal caliber thoracic aorta. Slight pulsation artifact along the ascending aorta. Mediastinum/Nodes: There is no specific abnormal lymph node enlargement present in the axillary regions, hilum or mediastinum. Thyroid gland is preserved. Normal caliber thoracic esophagus. Lungs/Pleura: Centrilobular emphysematous changes are identified. Changes are advanced. Greatest in the upper lung zones. Few subpleural blebs. There is scarring and atelectatic changes along the left upper lobe towards the apex. There are bandlike consolidative opacity along both lower lobes. Atelectasis is favored over infiltrate recommend follow-up. Musculoskeletal: There is curvature of the thoracic spine. There is slight wedge deformity at T7 with Schmorl's node deformity. Similar changes along the lumbar spine at L2. Scattered degenerative changes are identified. CT ABDOMEN PELVIS FINDINGS Hepatobiliary: Mild fatty liver infiltration. No space-occupying liver lesion. Gallbladder is contracted. Patent portal vein. Pancreas: The pancreas is diffusely atrophic with several calcifications consistent with chronic calcific pancreatitis. The pancreatic duct is dilated up to 7-8 mm. This was seen previously. There are some calcifications along the course of the duct towards the neck/head region. On the prior there is some peripancreatic fat stranding and fluid which is decreasing today. No well-defined fluid collections identified at this time. No areas of poor pancreatic enhancement Spleen: Mildly enlarged. Several collateral vessels in the left upper quadrant including along the margin of the stomach and duodenal. Adrenals/Urinary Tract: Adrenal glands are preserved. No enhancing renal masses. No collecting system dilatation. 4 mm tiny low-attenuation lesion in the left kidney is unchanged from previous and likely a small cyst. No specific imaging follow up recommended. Preserved contours of the distended urinary bladder.  Stomach/Bowel: There is moderate debris in the stomach with some oral contrast. Surgical changes along the anterior aspect of the body of the stomach. Please correlate with known history. Large bowel [name] a normal course and caliber with scattered stool. Surgical changes as well along loops of bowel in the right lower quadrant. No small bowel dilatation. Vascular/Lymphatic: Normal caliber aorta and IVC with atherosclerotic calcified plaque. No specific abnormal lymph node enlargement identified. There are several small less than 1 cm in size retroperitoneal nodes identified, not pathologic by size criteria. Reproductive: Prostate is unremarkable. Other: No ascites.  No free intra-abdominal air. Musculoskeletal: Curvature and degenerative changes of the spine. Degenerative changes of the pelvis including the sacroiliac joints. Significant disc height loss with sclerosis at the L4-5 level. Curvature of the spine IMPRESSION: Again changes of chronic calcific pancreatitis with small calcifications, parenchymal atrophy and dilated pancreatic duct. The amount of stranding seen on the prior CT scan is decreasing today. No new fluid collections. Increasing bandlike opacities along  both lower lobes. Atelectasis is favored. Recommend follow-up. Fatty liver infiltration. Mild splenomegaly.  Few left upper quadrant collateral vessels Emphysematous lung changes.  Mild atherosclerotic arterial changes. Electronically Signed   By: Karen Kays M.D.   On: 02/21/2022 14:23   US Abdomen Limited RUQ (LIVER/GB)  Result Date: 02/17/2022 CLINICAL DATA:  Pancreatitis. EXAM: ULTRASOUND ABDOMEN LIMITED RIGHT UPPER QUADRANT COMPARISON:  None Available. FINDINGS: Gallbladder: Surgically absent Common bile duct: Diameter: 5 mm Liver: No focal lesion identified. Within normal limits in parenchymal echogenicity. Portal vein is patent on color Doppler imaging with normal direction of blood flow towards the liver. Other: None. IMPRESSION: 1. The  gallbladder is surgically absent. The common bile duct is normal in caliber. 2. No other abnormalities. Electronically Signed   By: Gerome Sam III M.D.   On: 02/17/2022 10:07   CT ABDOMEN PELVIS W CONTRAST  Result Date: 02/17/2022 CLINICAL DATA:  Acute abdominal pain. EXAM: CT ABDOMEN AND PELVIS WITH CONTRAST TECHNIQUE: Multidetector CT imaging of the abdomen and pelvis was performed using the standard protocol following bolus administration of intravenous contrast. RADIATION DOSE REDUCTION: This exam was performed according to the departmental dose-optimization program which includes automated exposure control, adjustment of the mA and/or kV according to patient size and/or use of iterative reconstruction technique. CONTRAST:  6mL OMNIPAQUE IOHEXOL 300 MG/ML  SOLN COMPARISON:  None Available. FINDINGS: Lower chest: No acute abnormality. Hepatobiliary: No focal liver abnormality is seen. No gallstones, gallbladder wall thickening, or biliary dilatation. Pancreas: Coarse calcifications in the pancreas compatible with sequela of chronic pancreatitis. Similar pancreatic ductal dilation measuring up to 5 mm. There is new hazy edema about the body and tail of the pancreas suggestive of acute on chronic pancreatitis. No organized fluid collection or pseudocysts. Spleen: Unremarkable. Adrenals/Urinary Tract: Normal adrenal glands. No urinary calculi or hydronephrosis. Unremarkable bladder. Stomach/Bowel: Normal caliber large and small bowel. Colonic diverticulosis without diverticulitis. Postoperative change about the stomach. Appendix not identified. Vascular/Lymphatic: Aortic atherosclerosis. Patent portal vein. 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 No enlarged abdominal or pelvic lymph nodes. Reproductive: Mild enlargement of the prostate. Other: No free intraperitoneal air. Musculoskeletal: No acute osseous abnormality. Chronic compression deformity of L2. Advanced  endplate degenerative change at L4-L5. IMPRESSION: Acute on chronic pancreatitis. Aortic Atherosclerosis (ICD10-I70.0). Electronically Signed   By: Minerva Fester M.D.   On: 02/17/2022 04:08    Microbiology: Results for orders placed or performed during the hospital encounter of 02/17/22  Culture, blood (Routine X 2) w Reflex to ID Panel     Status: None (Preliminary result)   Collection Time: 02/20/22  4:02 PM   Specimen: BLOOD RIGHT HAND  Result Value Ref Range Status   Specimen Description BLOOD RIGHT HAND  Final   Special Requests   Final    BOTTLES DRAWN AEROBIC AND ANAEROBIC Blood Culture adequate volume   Culture   Final    NO GROWTH < 24 HOURS Performed at [facility], [address] [place], [place] [address]    Report Status PENDING  Incomplete  Culture, blood (Routine X 2) w Reflex to ID Panel     Status: None (Preliminary result)   Collection Time: 02/20/22  4:09 PM   Specimen: BLOOD LEFT HAND  Result Value Ref Range Status   Specimen Description BLOOD LEFT HAND  Final   Special Requests   Final    BOTTLES DRAWN AEROBIC AND ANAEROBIC Blood Culture adequate volume   Culture   Final  NO GROWTH < 24 HOURS Performed at [facility], [address], [place], [place] [address]    Report Status PENDING  Incomplete    Labs: CBC: Recent Labs  Lab 02/17/22 0022 02/18/22 0701 02/21/22 0436  WBC 12.8* 9.3 4.1  NEUTROABS 10.8* 6.9  --   HGB 17.8* 14.6 14.6  HCT 53.0* 44.1 43.7  MCV 93.8 95.5 92.8  PLT 307 207 154   Basic Metabolic Panel: Recent Labs  Lab 02/17/22 0022 02/18/22 0701 02/19/22 0341 02/19/22 0753 02/20/22 0345 02/21/22 0436  NA 138 133* 135  --  136 134*  K 3.7 3.3* 3.1*  --  3.1* 3.3*  CL 101 103 103  --  103 102  CO2 23 20* 24  --  23 24  GLUCOSE 111* 56* 102*  --  99 108*  BUN 11 10 5*  --  <5* <5*  CREATININE 0.83 0.68 0.59*  --  0.63 0.72  CALCIUM 9.2 8.0* 8.0*  --  8.4* 8.2*  MG  --  1.8  --  2.2  --  1.8   Liver Function  Tests: Recent Labs  Lab 02/18/22 0701 02/21/22 0436  AST 16 22  ALT 11 17  ALKPHOS 72 67  BILITOT 1.5* 0.7  PROT 5.7* 6.0*  ALBUMIN 3.0* 3.0*   CBG: Recent Labs  Lab 02/18/22 1400 02/18/22 1620 02/18/22 2136 02/19/22 0744  GLUCAP 124* 92 122* 109*    Discharge time spent: greater than 30 minutes.  Signed: Catarina Hartshorn, MD [facility] 02/21/2022

## 2022-02-21 NOTE — Progress Notes (Signed)
Pt having frequent request for pain medications to manage his MS pain.  Reports his home gabapentin dose is increased compared to his current dose inpt.  Doses given for comfort as ordered, will continue to monitor.  UA sent last noc, culture pending.  Pt with elevated temp 103.1 this am, resolving with oral tylenol . Reviewed yellow MEWS with provider, no new orders received.  Shadow drainage on ABD dressing covering g tube site, area circled.  Pt ambulatory in room, denies CP/SHOB.  No cough present.  Reports that ABD pain is controlled, denies nausea.  Last BM on 1/9 per pt, states this is his baseline.

## 2022-02-21 NOTE — Progress Notes (Signed)
PROGRESS NOTE  Brent [name] JKD:[id number] DOB: 12-Jan-1971 DOA: 02/17/2022 PCP: Lemmie Evens, MD  Brief History:  52 y.o. male with medical history significant of chronic pancreatitis, history of alcohol abuse, depression, anxiety, GERD, MS, and more presents the ED with a chief complaint of abdominal pain.  Patient was abdominal pain started at 1 PM on January 6.  Reports has been progressively worsening since it started.  Normally when he is a pancreatic flare he can keep water down, but this time he has not even been able to keep water down.  He has associated nausea and vomiting.  Nonbloody emesis.  Patient reports that usually he is able to not eat anything for a day and his pain goes away, but that was not the case this time.  He was given multiple doses of fentanyl in the ED, he reports he is 1 improved his pain by 50%, but then the pain will creep back up.  He describes it as a severe pain that feels like an ice knife being stabbed in the hand and then twisted.  He reports he is thrown up more times than he can count.  His last normal meal was on the fifth.  His last normal bowel movement was 2 days ago.  He has not had any diarrhea.  Patient reports subjective fever and chills, but no measured temp.  Patient has no other complaints at this time.   Patient smokes 1.5 packs/day, quit drinking alcohol in 2013 and reports drop since, no illicit drugs.  Patient is vaccinated for COVID and flu.  Patient is full code.   Assessment/Plan:  * Acute on chronic pancreatitis (HCC) - Lipase remains elevated, clinically he seems to be improving - CT abdomen pelvis shows acute on chronic pancreatitis - Patient reports his last alcoholic drink was in 0998 - Thinks that his pancreatitis is flared because he had too much hamburger 1 day prior to arrival - advance to full liquid diet>>soft diet on 1/10 - IV fluids - judicious opioids   Fever -had temp 102.9 02/20/22 -blood  cultures -UA--neg for pyuria -repeat CT abd due to persistent fever   Hypoglycemia --resolved after diet started  --continue the added dextrose to IV fluid for now    Leukocytosis - Likely related to pancreatitis - No infectious symptoms outside of what can be explained by pancreatitis - WBC down to normal    MS (multiple sclerosis) ([place]) --saw Dr. Arlice Colt 12/05/21--holding off disease modifying drugs as he was not certain at that time pt truly had MS.   Hypokalemia -- IV replacement given -give po replacement now he's tolerating po   Tobacco abuse - Smokes a pack and half per day - Counseled on the importance of cessation - Nicotine patch ordered   Anxiety - Continue Xanax (dose reduced in hospital while on IV opioids) 0.5 mg TID prn -PDMP reviewed--xanax 1 mg, #120 monthly, last refill 01/21/22 -norco 10/325 #90, monthly, last refill 01/25/22 -zolpidem 10 mg, #30, monthly, last refill 01/19/22           Family Communication: no  Family at bedside  Consultants:  none  Code Status:  FULL   DVT Prophylaxis:  [place] Heparin   Procedures: As Listed in Progress Note Above  Antibiotics: None        Subjective: Patient complains of epigastric abd pain which is improving.  Denies n/v/d.  Denies cp, sob, headache.  Denies rash  Objective: Vitals:   02/21/22 0312 02/21/22 0401 02/21/22 0448 02/21/22 0823  BP: (!) 118/91  105/81 102/79  Pulse: 100  97 79  Resp: 18  18   Temp: (!) 103.1 F (39.5 C) (!) 100.4 F (38 C) 99.9 F (37.7 C) 97.9 F (36.6 C)  TempSrc: Oral Oral Oral Oral  SpO2: 92%  92% 94%  Weight:      Height:        Intake/Output Summary (Last 24 hours) at 02/21/2022 1252 Last data filed at 02/21/2022 0900 Gross per 24 hour  Intake 2662.82 ml  Output 500 ml  Net 2162.82 ml   Weight change:  Exam:  General:  Pt is alert, follows commands appropriately, not in acute distress HEENT: No icterus, No thrush, No neck mass,  [place]/AT Cardiovascular: RRR, S1/S2, no rubs, no gallops Respiratory: CTA bilaterally, no wheezing, no crackles, no rhonchi Abdomen: Soft/+BS, epigastric tender, non distended, no guarding Extremities: No edema, No lymphangitis, No petechiae, No rashes, no synovitis   Data Reviewed: I have personally reviewed following labs and imaging studies Basic Metabolic Panel: Recent Labs  Lab 02/17/22 0022 02/18/22 0701 02/19/22 0341 02/19/22 0753 02/20/22 0345 02/21/22 0436  NA 138 133* 135  --  136 134*  K 3.7 3.3* 3.1*  --  3.1* 3.3*  CL 101 103 103  --  103 102  CO2 23 20* 24  --  23 24  GLUCOSE 111* 56* 102*  --  99 108*  BUN 11 10 5*  --  <5* <5*  CREATININE 0.83 0.68 0.59*  --  0.63 0.72  CALCIUM 9.2 8.0* 8.0*  --  8.4* 8.2*  MG  --  1.8  --  2.2  --  1.8   Liver Function Tests: Recent Labs  Lab 02/18/22 0701 02/21/22 0436  AST 16 22  ALT 11 17  ALKPHOS 72 67  BILITOT 1.5* 0.7  PROT 5.7* 6.0*  ALBUMIN 3.0* 3.0*   Recent Labs  Lab 02/17/22 0022 02/18/22 0701 02/19/22 0341 02/20/22 0345 02/21/22 0436  LIPASE 199* 127* 164* 128* 179*   No results for input(s): "AMMONIA" in the last 168 hours. Coagulation Profile: No results for input(s): "INR", "PROTIME" in the last 168 hours. CBC: Recent Labs  Lab 02/17/22 0022 02/18/22 0701 02/21/22 0436  WBC 12.8* 9.3 4.1  NEUTROABS 10.8* 6.9  --   HGB 17.8* 14.6 14.6  HCT 53.0* 44.1 43.7  MCV 93.8 95.5 92.8  PLT 307 207 154   Cardiac Enzymes: No results for input(s): "CKTOTAL", "CKMB", "CKMBINDEX", "TROPONINI" in the last 168 hours. BNP: Invalid input(s): "POCBNP" CBG: Recent Labs  Lab 02/18/22 1400 02/18/22 1620 02/18/22 2136 02/19/22 0744  GLUCAP 124* 92 122* 109*   HbA1C: No results for input(s): "HGBA1C" in the last 72 hours. Urine analysis:    Component Value Date/Time   COLORURINE YELLOW 02/20/2022 2035   APPEARANCEUR CLEAR 02/20/2022 2035   LABSPEC 1.004 (L) 02/20/2022 2035   PHURINE 7.0  02/20/2022 2035   GLUCOSEU NEGATIVE 02/20/2022 2035   HGBUR NEGATIVE 02/20/2022 2035   BILIRUBINUR NEGATIVE 02/20/2022 2035   KETONESUR NEGATIVE 02/20/2022 2035   PROTEINUR NEGATIVE 02/20/2022 2035   UROBILINOGEN 0.2 11/06/2014 2345   NITRITE NEGATIVE 02/20/2022 2035   LEUKOCYTESUR NEGATIVE 02/20/2022 2035   Sepsis Labs: @LABRCNTIP (procalcitonin:4,lacticidven:4) ) Recent Results (from the past 240 hour(s))  Culture, blood (Routine X 2) w Reflex to ID Panel     Status: None (Preliminary result)   Collection Time: 02/20/22  4:02  PM   Specimen: BLOOD RIGHT HAND  Result Value Ref Range Status   Specimen Description BLOOD RIGHT HAND  Final   Special Requests   Final    BOTTLES DRAWN AEROBIC AND ANAEROBIC Blood Culture adequate volume   Culture   Final    NO GROWTH < 24 HOURS Performed at [facility], [address] [place], [place] [address]    Report Status PENDING  Incomplete  Culture, blood (Routine X 2) w Reflex to ID Panel     Status: None (Preliminary result)   Collection Time: 02/20/22  4:09 PM   Specimen: BLOOD LEFT HAND  Result Value Ref Range Status   Specimen Description BLOOD LEFT HAND  Final   Special Requests   Final    BOTTLES DRAWN AEROBIC AND ANAEROBIC Blood Culture adequate volume   Culture   Final    NO GROWTH < 24 HOURS Performed at [facility], [address] [place], [place] [address]    Report Status PENDING  Incomplete     Scheduled Meds:  heparin  5,000 Units Subcutaneous Q8H   nicotine  21 mg Transdermal Daily   pantoprazole  40 mg Oral Daily   Continuous Infusions:  dextrose 5 % and 0.9% NaCl 75 mL/hr at 02/21/22 0305    Procedures/Studies: US Abdomen Limited RUQ (LIVER/GB)  Result Date: 02/17/2022 CLINICAL DATA:  Pancreatitis. EXAM: ULTRASOUND ABDOMEN LIMITED RIGHT UPPER QUADRANT COMPARISON:  None Available. FINDINGS: Gallbladder: Surgically absent Common bile duct: Diameter: 5 mm Liver: No focal lesion identified. Within normal limits in  parenchymal echogenicity. Portal vein is patent on color Doppler imaging with normal direction of blood flow towards the liver. Other: None. IMPRESSION: 1. The gallbladder is surgically absent. The common bile duct is normal in caliber. 2. No other abnormalities. Electronically Signed   By: Gerome Sam III M.D.   On: 02/17/2022 10:07   CT ABDOMEN PELVIS W CONTRAST  Result Date: 02/17/2022 CLINICAL DATA:  Acute abdominal pain. EXAM: CT ABDOMEN AND PELVIS WITH CONTRAST TECHNIQUE: Multidetector CT imaging of the abdomen and pelvis was performed using the standard protocol following bolus administration of intravenous contrast. RADIATION DOSE REDUCTION: This exam was performed according to the departmental dose-optimization program which includes automated exposure control, adjustment of the mA and/or kV according to patient size and/or use of iterative reconstruction technique. CONTRAST:  35mL OMNIPAQUE IOHEXOL 300 MG/ML  SOLN COMPARISON:  None Available. FINDINGS: Lower chest: No acute abnormality. Hepatobiliary: No focal liver abnormality is seen. No gallstones, gallbladder wall thickening, or biliary dilatation. Pancreas: Coarse calcifications in the pancreas compatible with sequela of chronic pancreatitis. Similar pancreatic ductal dilation measuring up to 5 mm. There is new hazy edema about the body and tail of the pancreas suggestive of acute on chronic pancreatitis. No organized fluid collection or pseudocysts. Spleen: Unremarkable. Adrenals/Urinary Tract: Normal adrenal glands. No urinary calculi or hydronephrosis. Unremarkable bladder. Stomach/Bowel: Normal caliber large and small bowel. Colonic diverticulosis without diverticulitis. Postoperative change about the stomach. Appendix not identified. Vascular/Lymphatic: Aortic atherosclerosis. Patent portal vein. 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 No enlarged abdominal or pelvic lymph nodes. Reproductive: Mild  enlargement of the prostate. Other: No free intraperitoneal air. Musculoskeletal: No acute osseous abnormality. Chronic compression deformity of L2. Advanced endplate degenerative change at L4-L5. IMPRESSION: Acute on chronic pancreatitis. Aortic Atherosclerosis (ICD10-I70.0). Electronically Signed   By: Minerva Fester M.D.   On: 02/17/2022 04:08    Catarina Hartshorn, DO  [facility]  If 7PM-7AM,  please contact night-coverage [email or web address] Password [email or web address] 02/21/2022, 12:52 PM   LOS: 3 days

## 2022-02-22 LAB — URINE CULTURE: Culture: NO GROWTH

## 2022-02-23 ENCOUNTER — Telehealth: Payer: 59 | Admitting: Nurse Practitioner

## 2022-02-23 DIAGNOSIS — J069 Acute upper respiratory infection, unspecified: Secondary | ICD-10-CM

## 2022-02-23 MED ORDER — FLUTICASONE PROPIONATE 50 MCG/ACT NA SUSP: 2.0000 | Freq: Every day | NASAL | 6 refills | Status: DC

## 2022-02-23 NOTE — Progress Notes (Signed)
E-Visit for Upper Respiratory Infection   We are sorry you are not feeling well.  Here is how we plan to help!  Based on what you have shared with me, it looks like you may have a viral upper respiratory infection.  Upper respiratory infections are caused by a large number of viruses; however, rhinovirus is the most common cause. You may want to consider taking a home COVID test as symptoms of sinus congestion and post nasal drainage are consistent with COVID symptoms.   Symptoms vary from person to person, with common symptoms including sore throat, cough, fatigue or lack of energy and feeling of general discomfort.  A low-grade fever of up to 100.4 may present, but is often uncommon.  Symptoms vary however, and are closely related to a person's age or underlying illnesses.  The most common symptoms associated with an upper respiratory infection are nasal discharge or congestion, cough, sneezing, headache and pressure in the ears and face.  These symptoms usually persist for about 3 to 10 days, but can last up to 2 weeks.  It is important to know that upper respiratory infections do not cause serious illness or complications in most cases.    Upper respiratory infections can be transmitted from person to person, with the most common method of transmission being a person's hands.  The virus is able to live on the skin and can infect other persons for up to 2 hours after direct contact.  Also, these can be transmitted when someone coughs or sneezes; thus, it is important to cover the mouth to reduce this risk.  To keep the spread of the illness at [place], good hand hygiene is very important.  This is an infection that is most likely caused by a virus. There are no specific treatments other than to help you with the symptoms until the infection runs its course.  We are sorry you are not feeling well.  Here is how we plan to help!   For nasal congestion, you may use an oral decongestants such as Mucinex D or  if you have glaucoma or high blood pressure use plain Mucinex.  Saline nasal spray or nasal drops can help and can safely be used as often as needed for congestion.  For your congestion, I have prescribed Fluticasone nasal spray one spray in each nostril twice a day  If you do not have a history of heart disease, hypertension, diabetes or thyroid disease, prostate/bladder issues or glaucoma, you may also use Sudafed to treat nasal congestion.  It is highly recommended that you consult with a pharmacist or your primary care physician to ensure this medication is safe for you to take.       If you have a sore or scratchy throat, use a saltwater gargle-  to  teaspoon of salt dissolved in a 4-ounce to 8-ounce glass of warm water.  Gargle the solution for approximately 15-30 seconds and then spit.  It is important not to swallow the solution.  You can also use throat lozenges/cough drops and Chloraseptic spray to help with throat pain or discomfort.  Warm or cold liquids can also be helpful in relieving throat pain.  For headache, pain or general discomfort, you can use Ibuprofen or Tylenol as directed.   Some authorities believe that zinc sprays or the use of Echinacea may shorten the course of your symptoms.   HOME CARE Only take medications as instructed by your medical team. Be sure to drink plenty of fluids.  Water is fine as well as fruit juices, sodas and electrolyte beverages. You may want to stay away from caffeine or alcohol. If you are nauseated, try taking small sips of liquids. How do you know if you are getting enough fluid? Your urine should be a pale yellow or almost colorless. Get rest. Taking a steamy shower or using a humidifier may help nasal congestion and ease sore throat pain. You can place a towel over your head and breathe in the steam from hot water coming from a faucet. Using a saline nasal spray works much the same way. Cough drops, hard candies and sore throat lozenges may  ease your cough. Avoid close contacts especially the very young and the elderly Cover your mouth if you cough or sneeze Always remember to wash your hands.   GET HELP RIGHT AWAY IF: You develop worsening fever. If your symptoms do not improve within 10 days You develop yellow or green discharge from your nose over 3 days. You have coughing fits You develop a severe head ache or visual changes. You develop shortness of breath, difficulty breathing or start having chest pain Your symptoms persist after you have completed your treatment plan  MAKE SURE YOU  Understand these instructions. Will watch your condition. Will get help right away if you are not doing well or get worse.  Thank you for choosing an e-visit.  Your e-visit answers were reviewed by a board certified advanced clinical practitioner to complete your personal care plan. Depending upon the condition, your plan could have included both over the counter or prescription medications.  Please review your pharmacy choice. Make sure the pharmacy is open so you can pick up prescription now. If there is a problem, you may contact your provider through [employer] and have the prescription routed to another pharmacy.  Your safety is important to [place]. If you have drug allergies check your prescription carefully.   For the next 24 hours you can use MyChart to ask questions about today's visit, request a non-urgent call back, or ask for a work or school excuse. You will get an email in the next two days asking about your experience. I hope that your e-visit has been valuable and will speed your recovery.  Meds ordered this encounter  Medications   fluticasone (FLONASE) 50 MCG/ACT nasal spray    Sig: Place 2 sprays into both nostrils daily.    Dispense:  16 g    Refill:  6     I spent approximately 5 minutes reviewing the patient's history, current symptoms and coordinating their care today.

## 2022-02-25 LAB — CULTURE, BLOOD (ROUTINE X 2)
Culture: NO GROWTH
Culture: NO GROWTH
Special Requests: ADEQUATE
Special Requests: ADEQUATE

## 2022-03-19 ENCOUNTER — Ambulatory Visit (HOSPITAL_COMMUNITY)
Admission: RE | Admit: 2022-03-19 | Discharge: 2022-03-19 | Disposition: A | Discharge status: Home / Self Care | Payer: 59 | Source: Ambulatory Visit | Attending: Neurology | Admitting: Neurology

## 2022-03-19 DIAGNOSIS — G35 Multiple sclerosis: Secondary | ICD-10-CM

## 2022-03-19 DIAGNOSIS — R208 Other disturbances of skin sensation: Secondary | ICD-10-CM | POA: Diagnosis present

## 2022-03-19 DIAGNOSIS — G35D Multiple sclerosis, unspecified: Secondary | ICD-10-CM

## 2022-03-19 MED ORDER — GADOBUTROL 1 MMOL/ML IV SOLN
7.0000 mL | Freq: Once | INTRAVENOUS | Status: AC | PRN
  Administered 2022-03-19: 7 mL via INTRAVENOUS

## 2022-03-21 ENCOUNTER — Telehealth: Payer: Self-pay | Admitting: *Deleted

## 2022-03-21 NOTE — Telephone Encounter (Signed)
Left message for patient to call.

## 2022-03-21 NOTE — Telephone Encounter (Signed)
-----   Message from Britt Bottom, MD sent at 03/21/2022 11:58 AM EST ----- Please let him know that I looked at the MRI of the brain and cervical spine.  They look pretty good.  He does not have any MS lesions in the spinal cord.  He does have some of the brain but there are not very many and they are small.  Since he has done fairly well or of a disease modifying therapy (last on Tysabri), we could remain off of a disease modifying therapy.  Alternatively if he much prefers to go back on a medication we can try to get him back on Tysabri.

## 2022-03-22 ENCOUNTER — Telehealth: Payer: 59 | Admitting: Nurse Practitioner

## 2022-03-22 DIAGNOSIS — B9789 Other viral agents as the cause of diseases classified elsewhere: Secondary | ICD-10-CM | POA: Diagnosis not present

## 2022-03-22 DIAGNOSIS — J329 Chronic sinusitis, unspecified: Secondary | ICD-10-CM | POA: Diagnosis not present

## 2022-03-22 MED ORDER — FLUTICASONE PROPIONATE 50 MCG/ACT NA SUSP: 2.0000 | Freq: Every day | NASAL | 6 refills | Status: DC

## 2022-03-22 NOTE — Progress Notes (Signed)
E-Visit for Sinus Problems  We are sorry that you are not feeling well.  Here is how we plan to help!  Based on what you have shared with me it looks like you have sinusitis.  Sinusitis is inflammation and infection in the sinus cavities of the head.  Based on your presentation I believe you most likely have Acute Viral Sinusitis.This is an infection most likely caused by a virus. COVID-19 can cause symptoms like this and you may want to consider repeat home testing since it has been several weeks since you were tested.   There is not specific treatment for viral sinusitis other than to help you with the symptoms until the infection runs its course.  You may use an oral decongestant such as Mucinex D or if you have glaucoma or high blood pressure use plain Mucinex. Saline nasal spray help and can safely be used as often as needed for congestion, I have prescribed: Fluticasone nasal spray two sprays in each nostril once a day Providers prescribe antibiotics to treat infections caused by bacteria. Antibiotics are very powerful in treating bacterial infections when they are used properly. To maintain their effectiveness, they should be used only when necessary. Overuse of antibiotics has resulted in the development of superbugs that are resistant to treatment!    After careful review of your answers, I would not recommend an antibiotic for your condition.  Antibiotics are not effective against viruses and therefore should not be used to treat them. Common examples of infections caused by viruses include colds and flu   Some authorities believe that zinc sprays or the use of Echinacea may shorten the course of your symptoms.  Sinus infections are not as easily transmitted as other respiratory infection, however we still recommend that you avoid close contact with loved ones, especially the very young and elderly.  Remember to wash your hands thoroughly throughout the day as this is the number one way to  prevent the spread of infection!  Home Care: Only take medications as instructed by your medical team. Do not take these medications with alcohol. A steam or ultrasonic humidifier can help congestion.  You can place a towel over your head and breathe in the steam from hot water coming from a faucet. Avoid close contacts especially the very young and the elderly. Cover your mouth when you cough or sneeze. Always remember to wash your hands.  Get Help Right Away If: You develop worsening fever or sinus pain. You develop a severe head ache or visual changes. Your symptoms persist after you have completed your treatment plan.  Make sure you Understand these instructions. Will watch your condition. Will get help right away if you are not doing well or get worse.   Thank you for choosing an e-visit.  Your e-visit answers were reviewed by a board certified advanced clinical practitioner to complete your personal care plan. Depending upon the condition, your plan could have included both over the counter or prescription medications.  Please review your pharmacy choice. Make sure the pharmacy is open so you can pick up prescription now. If there is a problem, you may contact your provider through [employer] and have the prescription routed to another pharmacy.  Your safety is important to [place]. If you have drug allergies check your prescription carefully.   For the next 24 hours you can use MyChart to ask questions about today's visit, request a non-urgent call back, or ask for a work or school excuse. You  will get an email in the next two days asking about your experience. I hope that your e-visit has been valuable and will speed your recovery.  Meds ordered this encounter  Medications   fluticasone (FLONASE) 50 MCG/ACT nasal spray    Sig: Place 2 sprays into both nostrils daily.    Dispense:  16 g    Refill:  6     I spent approximately 5 minutes reviewing the patient's history,  current symptoms and coordinating their care today.

## 2022-03-25 NOTE — Telephone Encounter (Signed)
Took call from phone staff and spoke with pt. Relayed results per Dr. Garth Bigness note. He would prefer to go back on a therapy. Wondering what other options he has besides Tysabri. Does he need further labs?   Chest pain worse and eyesight worse compared to what he had at last visit. Aware I will send to MD for review and call back with his recommendations.

## 2022-03-26 NOTE — Telephone Encounter (Signed)
Called pt at [phone]. LVM for him to call the office

## 2022-03-27 NOTE — Telephone Encounter (Signed)
LVM for pt at [phone] to call office

## 2022-03-28 NOTE — Telephone Encounter (Signed)
Called pt again at [phone]. LVM asking for a call.  I sent mychart since unable to reach by phone.

## 2022-04-15 NOTE — Telephone Encounter (Signed)
Called pt. Scheduled sooner appt for 04/18/22 at 10am, check in 930am. He will sign start form at appt for glatiramer since having trouble sending back via fax. Cx 08/2022 appt since he will be coming in sooner.

## 2022-04-18 ENCOUNTER — Ambulatory Visit: Payer: 59 | Admitting: Neurology

## 2022-05-15 ENCOUNTER — Ambulatory Visit: Payer: 59 | Admitting: Neurology

## 2022-06-03 ENCOUNTER — Encounter: Payer: Self-pay | Admitting: Neurology

## 2022-06-03 ENCOUNTER — Telehealth: Payer: Self-pay | Admitting: *Deleted

## 2022-06-03 ENCOUNTER — Ambulatory Visit (INDEPENDENT_AMBULATORY_CARE_PROVIDER_SITE_OTHER): Payer: 59 | Admitting: Neurology

## 2022-06-03 VITALS — BP 107/72 | HR 84 | Ht 67.0 in | Wt 113.2 lb

## 2022-06-03 DIAGNOSIS — H35063 Retinal vasculitis, bilateral: Secondary | ICD-10-CM

## 2022-06-03 DIAGNOSIS — K861 Other chronic pancreatitis: Secondary | ICD-10-CM

## 2022-06-03 DIAGNOSIS — G35 Multiple sclerosis: Secondary | ICD-10-CM | POA: Diagnosis not present

## 2022-06-03 DIAGNOSIS — H539 Unspecified visual disturbance: Secondary | ICD-10-CM

## 2022-06-03 DIAGNOSIS — R208 Other disturbances of skin sensation: Secondary | ICD-10-CM

## 2022-06-03 DIAGNOSIS — K859 Acute pancreatitis without necrosis or infection, unspecified: Secondary | ICD-10-CM | POA: Diagnosis not present

## 2022-06-03 MED ORDER — GLATIRAMER ACETATE 40 MG/ML ~~LOC~~ SOSY: PREFILLED_SYRINGE | SUBCUTANEOUS | 11 refills | Status: DC

## 2022-06-03 NOTE — Progress Notes (Signed)
[facility]  PATIENT: Brent [name] DOB: 1970/07/30  REFERRING DOCTOR OR PCP: Dillard Essex, NP; John Giovanni, MD SOURCE: Patient, notes from primary care, notes from [facility] neurology, imaging and lab reports, MRI images available were personally reviewed, additional studies will be reviewed if we are able to obtain the images  _________________________________   HISTORICAL  CHIEF COMPLAINT:  Chief Complaint  Patient presents with   Follow-up    RM 11, alone. Last seen 12/05/21.  Reports vision worse, eyes burning, having eye pain. Started getting worse in last two months. Last eye appt about a year ago. Start form in room for glatiramer.    HISTORY OF PRESENT ILLNESS:  Brent [name] is a 52 yo man with a diagnosis of MS.  UPDATE 06/03/2022: Since the last visit, he feels he is fairly stable.  The main problem continues to be vision.  He has poor visual acuity (20/100 OD and 20/200 OS today.  He felt vision was worse in 2013/2014 and improved some in 2022 but is worsening again.  He has not seen ophthalmology in almost 2 years.  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   Ophthalmology exam in 2015 at [facility] reported possible retinal vasculitis and he was placed on steroids again before his cataract surgery.     Besides the difficulty with his vision, he notes gait is mildly off-balance and he holds the banister on stairs.  Endurance with gait is reduced compared to before his issues but about the same as last year.  He denies actual numbness but notes a tremulous sensation.  He also has dysesthesias in the chest.  These are often painful.  He is on gabapentin with just a little benefit.   History of MS diagnosis He was diagnosed with MS in 2014 due to visual loss in both eyes.  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  He then noted  bilateral floaters in his vision that affected his ability to read.  Visual acuity worsened but he did not have any pain.  There is no diplopia.  He is sore ophthalmology at [facility] and was diagnosed with retinal vasculitis and cataracts.. He reports xrays showed he had a mass in his chest and he had a bronchoscopy.  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  He was placed on antibiotics and the mass improved.   Visual symptoms persisted and he was referred to [facility] neurology and then [facility] neurology (saw Dr. Elwyn Reach). MRI 06/30/2012 showed a few scattered white matter hyperintense foci that were felt to be nonspecific by radiology..  Visual evoked potentials 08/07/2013 at [facility] showed bilateral moderate P100 latency prolongation and he was diagnosed with MS.  At some point in 2014 or maybe 2015, he saw Dr. Gerilyn Pilgrim and had a lumbar puncture and he reports being told that it was not diagnostic.  He states in 2014 and 2015 he was placed on steroids a couple times because of the visual changed.    He also had cataract surgery.    He was briefly on Tecfidera in 2014 but had side effects and stopped He was on Tysabri off/on since August 2018 with last dose March 2023.  He had a large hiatus after he had pancreatitis in 2019.   Multiple brain MRI, last in  2021, have been stable.  He used to drink heavily but stopped 2020 after pancreatitis.     He has had chronic malnutrition and was on PEG tube feeds in 2020 through early 2023.  At the time the symptoms started in 2014 he feels he was not eating well but he had not been diagnosed with malnutrition.  He is now eating better and has gained 6 pounds this year.    Vascular risk factors: Tobacco use  Imaging: MRI of the brain 03/19/2022 showed some scattered T2/FLAIR hyperintense foci.  None of the foci enhanced.  Compared to the MRI from 2014, there were just 2 or 3 more small lesions.  Chronic left maxillary  sinusitis  MRI of the cervical and thoracic spine 03/19/2022 showed a normal spinal cord.  MRI of the brain 06/30/2012 showed some scattered T2/FLAIR hyperintense foci.  The overall plaque burden is low.  A couple foci were in the periventricular and juxtacortical white matter.  There were no foci in the infratentorial white matter.  By report, MRI of the cervical spine 10/06/2019 showed a normal spinal cord and a disc osteophyte complex towards the left at C5-C6 causing severe left foraminal narrowing and degenerative changes at C6-C7 that cause foraminal narrowing.  By report, MRI of the brain 10/06/2019 showed multiple T2/FLAIR hyperintense foci predominantly in the juxtacortical and periventricular white matter.  There were no acute findings.  Stable compared to the 2020 MRI  MRI of the brain 01/30/2004 was personally reviewed and was normal.  MRI of the cervical spine 08/14/2006 shows a normal spinal cord and degenerative changes at C5-C6 and C6-C7 which could affect the left C6 nerve root  REVIEW OF SYSTEMS: Constitutional: No fevers, chills, sweats, or change in appetite Eyes: No visual changes, double vision, eye pain Ear, nose and throat: No hearing loss, ear pain, nasal congestion, sore throat Cardiovascular: No chest pain, palpitations Respiratory:  No shortness of breath at rest or with exertion.   No wheezes GastrointestinaI: No nausea, vomiting, diarrhea, abdominal pain, fecal incontinence Genitourinary:  No dysuria, urinary retention or frequency.  No nocturia. Musculoskeletal:  No neck pain, back pain Integumentary: No rash, pruritus, skin lesions Neurological: as above Psychiatric: No depression at this time.  No anxiety Endocrine: No palpitations, diaphoresis, change in appetite, change in weigh or increased thirst Hematologic/Lymphatic:  No anemia, purpura, petechiae. Allergic/Immunologic: No itchy/runny eyes, nasal congestion, recent allergic reactions,  rashes  ALLERGIES: No Known Allergies  HOME MEDICATIONS:  Current Outpatient Medications:    albuterol (PROVENTIL HFA;VENTOLIN HFA) 108 (90 Base) MCG/ACT inhaler, Inhale 1-2 puffs into the lungs every 6 (six) hours as needed for wheezing or shortness of breath. , Disp: , Rfl:    ALPRAZolam (XANAX) 1 MG tablet, Take 1 tablet (1 mg total) by mouth 2 (two) times daily as needed for anxiety. (Patient taking differently: Take 1 mg by mouth 4 (four) times daily as needed for anxiety.), Disp: 30 tablet, Rfl: 0   cetirizine (ZYRTEC) 10 MG tablet, Take 10 mg by mouth daily., Disp: , Rfl:    Cholecalciferol (VITAMIN D HIGH POTENCY) 25 MCG (1000 UT) capsule, Take 1,000 Units by mouth daily., Disp: , Rfl:    CREON 3000-9500 units CPEP, Take 1 capsule by mouth 3 (three) times daily with meals., Disp: , Rfl:    cyanocobalamin (VITAMIN B12) 1000 MCG tablet, Take 2,000 mcg by mouth daily., Disp: , Rfl:    docusate sodium (COLACE) 100 MG capsule, Take 100 mg by mouth daily as  needed for mild constipation., Disp: , Rfl:    gabapentin (NEURONTIN) 600 MG tablet, Take 600 mg by mouth 4 (four) times daily., Disp: , Rfl:    HYDROcodone-acetaminophen (NORCO) 10-325 MG tablet, Take 1 tablet by mouth every 6 (six) hours as needed for severe pain. For pain, Disp: 30 tablet, Rfl: 0   ketorolac (ACULAR) 0.5 % ophthalmic solution, Place 2 drops into both eyes 4 (four) times daily., Disp: , Rfl:    omeprazole (PRILOSEC) 40 MG capsule, Take 40 mg by mouth at bedtime., Disp: , Rfl:    ondansetron (ZOFRAN) 4 MG tablet, Take 4 mg by mouth every 8 (eight) hours as needed for nausea. , Disp: , Rfl:    prednisoLONE acetate (PRED FORTE) 1 % ophthalmic suspension, Place 2 drops into both eyes 2 (two) times daily. , Disp: , Rfl:    SYMBICORT 80-4.5 MCG/ACT inhaler, Inhale 1 puff into the lungs 2 (two) times daily., Disp: , Rfl:    zolpidem (AMBIEN) 10 MG tablet, Take 10 mg by mouth at bedtime as needed for sleep., Disp: , Rfl:     Glatiramer Acetate (COPAXONE) 40 MG/ML SOSY, One po three times a week, Disp: 11.76 mL, Rfl: 11  PAST MEDICAL HISTORY: Past Medical History:  Diagnosis Date   Alcohol use    Anxiety    Disabled due to panic attacks   Arthritis    Bilateral ureteral calculi    Borderline type 2 diabetes mellitus    BPH (benign prostatic hypertrophy)    Chronic back pain    Condylomata acuminata in male    multiple procedures --  penile, peri-rectal , perineum   DDD (degenerative disc disease), cervical    Depression    Dyspnea on exertion    Emphysematous COPD    Epiretinal membrane (ERM) of both eyes    Fibromyalgia    GERD (gastroesophageal reflux disease)    Headache(784.0)    History of chronic pancreatitis    severeal adx for this /  2008  dx alcoholic pancreatitis   History of esophageal dilatation    History of hiatal hernia    History of kidney stones    History of panic attacks    History of sepsis    adx 05-01-2014--  urosepsis due to kidney stones obstruction/ hydronephrosis   History of suicidal ideation    2006   adx   Hypertension    was on medication for short time, htn was caused by prednisone per pt   Multiple sclerosis    pt. states has 4 small brain lesions   Nephrolithiasis    bilateral    Retinal vasculitis    Uveitis     PAST SURGICAL HISTORY: Past Surgical History:  Procedure Laterality Date   BIOPSY  11/12/2012   Procedure: GASTRIC AND ESOPHAGEAL BIOPSIES;  Surgeon: Corbin Ade, MD;  Location: [facility];  Service: Endoscopy;;   CATARACT EXTRACTION W/ INTRAOCULAR LENS  IMPLANT, BILATERAL     COLONOSCOPY  10/08/2011   Jenkins:Normal colon/Anal condyloma without extension proximal to dentate line   CYSTOSCOPY W/ URETERAL STENT PLACEMENT Bilateral 05/01/2014   Procedure: CYSTOSCOPY WITH BILATERAL RETROGRADE PYELOGRAM; BILATERAL URETERAL STENT PLACEMENT;  Surgeon: Barron Alvine, MD;  Location: [facility];  Service: Urology;  Laterality: Bilateral;   CYSTOSCOPY W/  URETERAL STENT REMOVAL Bilateral 06/06/2014   Procedure: CYSTOSCOPY WITH STENT REMOVAL;  Surgeon: Marcine Matar, MD;  Location: [facility];  Service: Urology;  Laterality: Bilateral;   CYSTOSCOPY WITH URETEROSCOPY  06/06/2014   Procedure: CYSTOSCOPY WITH URETEROSCOPY;  Surgeon: Marcine Matar, MD;  Location: [facility];  Service: Urology;;   CYSTOSCOPY WITH URETEROSCOPY AND STENT PLACEMENT Bilateral 06/06/2014   Procedure: CYSTOSCOPY WITH J2 STENT EXTRACTION,,URETEROSCOPY WITH EXTRACTION OF STONES,;  Surgeon: Marcine Matar, MD;  Location: [facility];  Service: Urology;  Laterality: Bilateral;   ELECTROCAUTERY/ DESICCATION OF CONDYLOMA LESIONS  01-15-2008  &  12-29-2009   PENIS, PERI-RECTAL , PERINUEM   ESOPHAGOGASTRODUODENOSCOPY (EGD) WITH PROPOFOL N/A 11/12/2012   NWG:NFAOZH esophagus s/p  passage of a Maloney dilator and biopsy. Abnormal gastric mucosa-status post biopsy   FLEXIBLE BRONCHOSCOPY N/A 08/12/2012   Procedure: FLEXIBLE BRONCHOSCOPY;  Surgeon: Fredirick Maudlin, MD;  Location: [facility];  Service: Pulmonary;  Laterality: N/A;   MALONEY DILATION N/A 11/12/2012   Procedure: MALONEY DILATION (54mm);  Surgeon: Corbin Ade, MD;  Location: [facility];  Service: Endoscopy;  Laterality: N/A;   MULTIPLE EXTRACTIONS WITH ALVEOLOPLASTY N/A 04/22/2014   Procedure: MULTIPLE EXTRACTIONS ( 2,3,5,6,7,8,9,10,11,13,14,15,21,28  WITH ALVEOLOPLASTY;  Surgeon: Ocie Doyne, DDS;  Location: [facility];  Service: Oral Surgery;  Laterality: N/A;   WISDOM TOOTH EXTRACTION      FAMILY HISTORY: Family History  Problem Relation Age of Onset   Hypertension Mother    Breast cancer Mother    Melanoma Mother    Stroke Mother    Lung cancer Father 9   Colon cancer Neg Hx    Colitis Neg Hx    Cirrhosis Neg Hx    Liver disease Neg Hx    Pancreatic cancer Neg Hx    Pancreatitis Neg Hx    Anesthesia problems Neg Hx    Hypotension Neg Hx    Malignant hyperthermia  Neg Hx    Pseudochol deficiency Neg Hx     SOCIAL HISTORY: Social History   Socioeconomic History   Marital status: Single    Spouse name: Not on file   Number of children: 0   Years of education: Not on file   Highest education level: Not on file  Occupational History   Occupation: disabled    Comment: anxiety  Tobacco Use   Smoking status: Former    Packs/day: 1.00    Years: 20.00    Additional pack years: 0.00    Total pack years: 20.00    Types: Cigarettes, Cigars    Quit date: 03/2022    Years since quitting: 0.2   Smokeless tobacco: Never   Tobacco comments:    1 pack per week now as of 02/24/2018   Vaping Use   Vaping Use: Never used  Substance and Sexual Activity   Alcohol use: Not Currently   Drug use: No   Sexual activity: Not on file  Other Topics Concern   Not on file  Social History Narrative   Right handed   Caffeine use: mostly water/OJ, tea sometimes.    Social Determinants of Health   Financial Resource Strain: Low Risk  (02/24/2018)   Overall Financial Resource Strain (CARDIA)    Difficulty of Paying Living Expenses: Not very hard  Food Insecurity: No Food Insecurity (02/17/2022)   Hunger Vital Sign    Worried About Running Out of Food in the Last Year: Never true    Ran Out of Food in the Last Year: Never true  Transportation Needs: No Transportation Needs (02/17/2022)   PRAPARE - Administrator, Civil Service (Medical): No    Lack of Transportation (Non-Medical): No  Physical Activity:  Not on file  Stress: Not on file  Social Connections: Not on file  Intimate Partner Violence: Not At Risk (02/17/2022)   Humiliation, Afraid, Rape, and Kick questionnaire    Fear of Current or Ex-Partner: No    Emotionally Abused: No    Physically Abused: No    Sexually Abused: No       PHYSICAL EXAM  Vitals:   06/03/22 1312  BP: 107/72  Pulse: 84  Weight: 113 lb 3.2 oz (51.3 kg)  Height: 5\' 7"  (1.702 m)     Body mass index is 17.73  kg/m.   OS: 20/200 OD:  20/100 OU:  20/100   General: The patient is well-developed and well-nourished and in no acute distress  HEENT:  Head is [place]/AT.  Sclera are anicteric.    Skin: Extremities are without rash or  edema.  Musculoskeletal:  Back is nontender  Neurologic Exam  Mental status: The patient is alert and oriented x 3 at the time of the examination. The patient has apparent normal recent and remote memory, with an apparently normal attention span and concentration ability.   Speech is normal.  Cranial nerves: Extraocular movements are full.  Facial strength and sensation was normal.. No obvious hearing deficits are noted.  Motor:  Muscle bulk is normal.   Tone is normal. Strength is  5 / 5 in all 4 extremities.   Sensory: Sensory testing is intact to pinprick, soft touch and vibration sensation in all 4 extremities.  Coordination: Cerebellar testing reveals good finger-nose-finger and heel-to-shin bilaterally.  Gait and station: Station is normal.   Gait is normal. Tandem gait is mildly wide. Romberg is negative.   Reflexes: Deep tendon reflexes are increased arms/legs with spread at knees but no ankle clonus.   Plantar responses are flexor.    DIAGNOSTIC DATA (LABS, IMAGING, TESTING) - I reviewed patient records, labs, notes, testing and imaging myself where available.  Lab Results  Component Value Date   WBC 4.1 02/21/2022   HGB 14.6 02/21/2022   HCT 43.7 02/21/2022   MCV 92.8 02/21/2022   PLT 154 02/21/2022      Component Value Date/Time   NA 134 (L) 02/21/2022 0436   NA 135 12/05/2021 1005   K 3.3 (L) 02/21/2022 0436   CL 102 02/21/2022 0436   CO2 24 02/21/2022 0436   GLUCOSE 108 (H) 02/21/2022 0436   BUN <5 (L) 02/21/2022 0436   BUN 5 (L) 12/05/2021 1005   CREATININE 0.72 02/21/2022 0436   CREATININE 0.74 05/09/2010 1517   CALCIUM 8.2 (L) 02/21/2022 0436   PROT 6.0 (L) 02/21/2022 0436   PROT 6.4 12/05/2021 1005   ALBUMIN 3.0 (L) 02/21/2022  0436   ALBUMIN 4.0 12/05/2021 1005   AST 22 02/21/2022 0436   AST 14 05/09/2010 1517   ALT 17 02/21/2022 0436   ALKPHOS 67 02/21/2022 0436   ALKPHOS 69 05/09/2010 1517   BILITOT 0.7 02/21/2022 0436   BILITOT 0.3 12/05/2021 1005   GFRNONAA >60 02/21/2022 0436   GFRAA >60 09/17/2019 0635   Lab Results  Component Value Date   CHOL 93 11/27/2017   HDL 29 (L) 11/27/2017   LDLCALC 36 11/27/2017   TRIG 94 02/25/2018   CHOLHDL 3.2 11/27/2017   Lab Results  Component Value Date   HGBA1C 5.2 06/09/2019   Lab Results  Component Value Date   VITAMINB12 1,274 (H) 12/05/2021   Lab Results  Component Value Date   TSH 2.560 12/05/2021  ASSESSMENT AND PLAN  Vision changes - Plan: Ambulatory referral to Ophthalmology  Multiple sclerosis - Plan: Ambulatory referral to Ophthalmology  Retinal vasculitis of both eyes - Plan: Ambulatory referral to Ophthalmology  Acute on chronic pancreatitis  Dysesthesia  He has possible MS.  The MRIs of the brain show some T2/STIR hyperintense foci though the number lesions are small and there has been only slight change since 2014 (was off medication for about half that time).  I discussed with him that he could have MS but I am also concerned about Susac's disease.  If he has MS, I feel Copaxone would be the safest option for him because of his many medical issues. He was set up an appointment to see ophthalmology (retina specialist).  If he has evidence of retinal vasculitis, then to be concerned about the possibility of  Susac's disease.  Of note, he does not have any hearing issues that are also seen with that disorder.  Continue gabapentin for dysesthesias.  If they worsen consider lamotrigine. Return in 6 months or sooner if there are new or worsening neurologic symptoms or based on the results of the evaluations.  40-minute office visit with the majority of the time spent face-to-face for history and physical, discussion/counseling and  decision-making.  Additional time with record review and documentation.  Giavana Rooke A. Epimenio Foot, MD, [facility] 06/03/2022, 5:07 PM Certified in Neurology, Clinical Neurophysiology, Sleep Medicine and Neuroimaging  [facility] [address], [address] [place], [place] [address] [phone]

## 2022-06-03 NOTE — Telephone Encounter (Signed)
Pt was seen today and Rx for Copaxone 40 mg/ML sosy printed.  Rx signed and sent to pharmacy.

## 2022-06-03 NOTE — Telephone Encounter (Signed)
Called [facility] and spoke w/ Selena Batten. They have not received rx yet but will d/c once they receive.    Faxed completed/signed glatiramer acetate  to [facility] at [phone]. Received fax confirmation.

## 2022-06-04 ENCOUNTER — Telehealth: Payer: Self-pay | Admitting: Neurology

## 2022-06-04 NOTE — Telephone Encounter (Signed)
Attempted PA for glatiramer via CMM. Sent to [facility] Medicare. Key: [id number]. Received this message: "This medication or product is on your plan's list of covered drugs. Prior authorization is not required at this time. If your pharmacy has questions regarding the processing of your prescription, please have them call the [facility] pharmacy help desk at [phone]. **Please note: This request was submitted electronically. Formulary lowering, tiering exception, cost reduction and/or pre-benefit determination review (including prospective Medicare hospice reviews) requests cannot be requested using this method of submission. Providers contact us at [phone] for further assistance."

## 2022-06-04 NOTE — Telephone Encounter (Signed)
Opthalmology referral faxed to [facility] (fax#[phone], phone# [phone]) 

## 2022-06-05 ENCOUNTER — Other Ambulatory Visit (HOSPITAL_COMMUNITY): Payer: Self-pay | Admitting: Family Medicine

## 2022-06-05 DIAGNOSIS — R079 Chest pain, unspecified: Secondary | ICD-10-CM

## 2022-06-19 ENCOUNTER — Telehealth: Payer: Self-pay | Admitting: Neurology

## 2022-06-19 NOTE — Telephone Encounter (Signed)
Cheryl called from [place]. Stated she wants to talk to nurse about training with pt. Stated she talk to pt and he got funny feeling under his tongue.

## 2022-06-20 NOTE — Telephone Encounter (Signed)
Called [facility] back. Spoke w/ Mardella Layman. They spoke w/ pt on 06/19/22. Pt reported rx to med/glatiramer. Has pending injection training and they want to know if it is ok to proceed d/t reported reaction.  States he received medication on 06/17/22. Did initial injection  but felt he didn't get full dose. Took another injection the next day.  After, he felt a weird feeling on tongue. Felt warm, no swelling. Took allergy pill and reaction lasted 5 min. They told him to reach out to our office to determine if ok to proceed with in home injection training. Aware we will speak MD and call back.  I spoke w/ Dr. Epimenio Foot who advised ok to proceed with future injections as planned. I called [facility] back and spoke w/ Alona Bene. Relayed message. She updated Shanique and nothing further needed at this time.

## 2022-06-26 ENCOUNTER — Ambulatory Visit (HOSPITAL_COMMUNITY)
Admission: RE | Admit: 2022-06-26 | Discharge: 2022-06-26 | Disposition: A | Discharge status: Home / Self Care | Payer: 59 | Source: Ambulatory Visit | Attending: Family Medicine | Admitting: Family Medicine

## 2022-06-26 DIAGNOSIS — R079 Chest pain, unspecified: Secondary | ICD-10-CM | POA: Insufficient documentation

## 2022-07-17 ENCOUNTER — Ambulatory Visit: Payer: 59 | Admitting: Family Medicine

## 2022-07-18 ENCOUNTER — Ambulatory Visit: Payer: 59 | Admitting: Family Medicine

## 2022-07-22 ENCOUNTER — Telehealth: Payer: 59 | Admitting: Physician Assistant

## 2022-07-22 DIAGNOSIS — K9420 Gastrostomy complication, unspecified: Secondary | ICD-10-CM

## 2022-07-22 NOTE — Progress Notes (Signed)
Because this is secondary to a drain that was placed, I feel your condition warrants further evaluation and I recommend that you be seen in a face to face visit. You should have an in person exam and possible imaging if deemed necessary.   NOTE: There will be NO CHARGE for this eVisit   If you are having a true medical emergency please call 911.      For an urgent face to face visit, [place] has eight urgent care centers for your convenience:   NEW!! [facility] Health Urgent Care Center at [facility] Get Driving Directions [phone] [address], [address] [place], [address]    [facility] Health Urgent Care Center at [facility] Get Driving Directions [phone] [address] [place], [place] [address]   [facility] Health Urgent Care Center [facility]) Get Driving Directions [phone] [address] [place], [place] [address]  [facility] Health Urgent Care Center [facility]) Get Driving Directions [phone] [address] [place],  [place]  [address]  [facility] Health Urgent Care Center [facility] - at [employer]  [phone] [address] [place],  [place] [address]   [facility] Health Urgent Care at [facility] Get Driving Directions [phone] [address] [place], [place] [address]   [facility] Health Urgent Care at [facility] Get Driving Directions  [phone] [address] [place], [place] [address]   [facility] Health Urgent Care at [facility] Directions [phone] [address] [place], [place] [address]  Your MyChart E-visit questionnaire answers were reviewed by a board certified advanced clinical practitioner to complete your personal care plan based on your specific symptoms.  Thank you for using e-Visits.   I have spent 5 minutes in review of e-visit questionnaire, review and updating patient chart, medical decision  making and response to patient.   Margaretann Loveless, PA-C

## 2022-08-09 DIAGNOSIS — K30 Functional dyspepsia: Secondary | ICD-10-CM | POA: Insufficient documentation

## 2022-09-05 ENCOUNTER — Institutional Professional Consult (permissible substitution): Payer: 59 | Admitting: Internal Medicine

## 2022-09-11 ENCOUNTER — Ambulatory Visit: Payer: 59 | Admitting: Neurology

## 2022-09-16 NOTE — Progress Notes (Deleted)
Brent [name], male    DOB: [birth year]    MRN: [id number]   Brief patient profile:  9  yowm  *** referred to pulmonary clinic in [place]  09/17/2022 by *** for ***      History of Present Illness  09/17/2022  Pulmonary/ 1st office eval/ [facility] / [facility]  No chief complaint on file.    Dyspnea:  *** Cough: *** Sleep: *** SABA use: *** 02: *** Lung cancer screen: ***   Outpatient Medications Prior to Visit  Medication Sig Dispense Refill   albuterol (PROVENTIL HFA;VENTOLIN HFA) 108 (90 Base) MCG/ACT inhaler Inhale 1-2 puffs into the lungs every 6 (six) hours as needed for wheezing or shortness of breath.      ALPRAZolam (XANAX) 1 MG tablet Take 1 tablet (1 mg total) by mouth 2 (two) times daily as needed for anxiety. (Patient taking differently: Take 1 mg by mouth 4 (four) times daily as needed for anxiety.) 30 tablet 0   cetirizine (ZYRTEC) 10 MG tablet Take 10 mg by mouth daily.     Cholecalciferol (VITAMIN D HIGH POTENCY) 25 MCG (1000 UT) capsule Take 1,000 Units by mouth daily.     CREON 3000-9500 units CPEP Take 1 capsule by mouth 3 (three) times daily with meals.     cyanocobalamin (VITAMIN B12) 1000 MCG tablet Take 2,000 mcg by mouth daily.     docusate sodium (COLACE) 100 MG capsule Take 100 mg by mouth daily as needed for mild constipation.     gabapentin (NEURONTIN) 600 MG tablet Take 600 mg by mouth 4 (four) times daily.     Glatiramer Acetate (COPAXONE) 40 MG/ML SOSY One po three times a week 11.76 mL 11   HYDROcodone-acetaminophen (NORCO) 10-325 MG tablet Take 1 tablet by mouth every 6 (six) hours as needed for severe pain. For pain 30 tablet 0   ketorolac (ACULAR) 0.5 % ophthalmic solution Place 2 drops into both eyes 4 (four) times daily.     omeprazole (PRILOSEC) 40 MG capsule Take 40 mg by mouth at bedtime.     ondansetron (ZOFRAN) 4 MG tablet Take 4 mg by mouth every 8 (eight) hours as needed for nausea.      prednisoLONE acetate (PRED FORTE) 1 %  ophthalmic suspension Place 2 drops into both eyes 2 (two) times daily.      SYMBICORT 80-4.5 MCG/ACT inhaler Inhale 1 puff into the lungs 2 (two) times daily.     zolpidem (AMBIEN) 10 MG tablet Take 10 mg by mouth at bedtime as needed for sleep.     No facility-administered medications prior to visit.    Past Medical History:  Diagnosis Date   Alcohol use    Anxiety    Disabled due to panic attacks   Arthritis    Bilateral ureteral calculi    Borderline type 2 diabetes mellitus    BPH (benign prostatic hypertrophy)    Chronic back pain    Condylomata acuminata in male    multiple procedures --  penile, peri-rectal , perineum   DDD (degenerative disc disease), cervical    Depression    Dyspnea on exertion    Emphysematous COPD (HCC)    Epiretinal membrane (ERM) of both eyes    Fibromyalgia    GERD (gastroesophageal reflux disease)    Headache(784.0)    History of chronic pancreatitis    severeal adx for this /  2008  dx alcoholic pancreatitis   History of esophageal dilatation  History of hiatal hernia    History of kidney stones    History of panic attacks    History of sepsis    adx 05-01-2014--  urosepsis due to kidney stones obstruction/ hydronephrosis   History of suicidal ideation    2006   adx   Hypertension    was on medication for short time, htn was caused by prednisone per pt   Multiple sclerosis (HCC)    pt. states has 4 small brain lesions   Nephrolithiasis    bilateral    Retinal vasculitis    Uveitis       Objective:     There were no vitals taken for this visit.         Assessment   No problem-specific Assessment & Plan notes found for this encounter.     Sandrea Hughs, MD 09/16/2022

## 2022-09-17 ENCOUNTER — Encounter: Payer: Self-pay | Admitting: Internal Medicine

## 2022-09-17 ENCOUNTER — Institutional Professional Consult (permissible substitution): Payer: 59 | Admitting: Internal Medicine

## 2022-09-17 DIAGNOSIS — H25043 Posterior subcapsular polar age-related cataract, bilateral: Secondary | ICD-10-CM | POA: Insufficient documentation

## 2022-09-23 ENCOUNTER — Encounter: Payer: Self-pay | Admitting: Pulmonary Disease

## 2022-11-18 ENCOUNTER — Telehealth: Payer: 59 | Admitting: Nurse Practitioner

## 2022-11-18 DIAGNOSIS — B977 Papillomavirus as the cause of diseases classified elsewhere: Secondary | ICD-10-CM

## 2022-11-18 NOTE — Progress Notes (Signed)
Brent [name],   Unfortunately HPV is not something we can treat through E-visits currently    To find a new primary care provider you can visit this website and pick a doctor that is closest to you [email or web address]  Or you can call the number below as well    NOTE: You will NOT be charged for this eVisit.  If you do not have a PCP, [name] offers a free physician referral service available at [phone]. Our trained staff has the experience, knowledge and resources to put you in touch with a physician who is right for you.    If you are having a true medical emergency please call 911.   Your e-visit answers were reviewed by a board certified advanced clinical practitioner to complete your personal care plan.  Thank you for using e-Visits.

## 2022-12-05 ENCOUNTER — Encounter: Payer: Self-pay | Admitting: Neurology

## 2022-12-05 ENCOUNTER — Ambulatory Visit: Payer: 59 | Admitting: Neurology

## 2023-07-24 ENCOUNTER — Ambulatory Visit: Payer: 59 | Admitting: Dermatology

## 2023-10-16 ENCOUNTER — Encounter: Admitting: Internal Medicine

## 2023-10-16 NOTE — Progress Notes (Deleted)
 Office Visit Note  Patient: Brent [name]             Date of Birth: 04-10-1970           MRN: [id number]             PCP: Nemiah Kemps, MD Referring: Kahoano, Haku K, MD Visit Date: 10/16/2023 Occupation: @GUAROCC @  Subjective:  No chief complaint on file.   History of Present Illness: Brent [name] is a 53 y.o. male ***     Activities of Daily Living:  Patient reports morning stiffness for *** {minute/hour:19697}.   Patient {ACTIONS;DENIES/REPORTS:21021675::Denies} nocturnal pain.  Difficulty dressing/grooming: {ACTIONS;DENIES/REPORTS:21021675::Denies} Difficulty climbing stairs: {ACTIONS;DENIES/REPORTS:21021675::Denies} Difficulty getting out of chair: {ACTIONS;DENIES/REPORTS:21021675::Denies} Difficulty using hands for taps, buttons, cutlery, and/or writing: {ACTIONS;DENIES/REPORTS:21021675::Denies}  No Rheumatology ROS completed.   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   Question of Mesenteric thrombosis 06/09/2019   Abnormal CT of the abdomen    Alcoholism (HCC) 02/25/2018   Prediabetes 02/25/2018   Pancreatitis 02/24/2018   Protein-calorie malnutrition, severe (HCC) 02/24/2018   MS (multiple sclerosis) (HCC) 11/28/2017   Acute pancreatitis 11/27/2017   Loose stools 11/27/2017   Acute on chronic pancreatitis (HCC) 11/27/2017   Pancreatitis, alcoholic, acute 02/18/2016   Acute alcoholic pancreatitis 11/07/2014   Bilateral ureteral calculi    Lower urinary tract infectious disease    Ureteral calculi 05/02/2014   Ureterolithiasis 05/01/2014   UTI (urinary tract infection) 05/01/2014   Sepsis (HCC) 05/01/2014   Hydronephrosis  05/01/2014   Tobacco abuse 05/01/2014   Hypokalemia 05/01/2014   Fibromyalgia 04/13/2014   Failure to thrive in pediatric patient 01/22/2014   CME (cystoid macular edema), bilateral 11/26/2013   Epiretinal membrane (ERM) of both eyes 08/18/2013   Dry eye syndrome 08/18/2013   Hyperthyroidism 08/18/2013   Long term current use of systemic steroids 08/18/2013   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   Melena 11/03/2012   Odynophagia 11/03/2012   Esophageal dysphagia 11/03/2012   Retinal vasculitis 08/27/2012   Chronic back pain    Anxiety    Loss of weight 05/04/2010   Epigastric pain 05/04/2010   Rectal pain 05/04/2010    Past Medical History:  Diagnosis Date   Alcohol  use    Anxiety    Disabled due to panic attacks   Arthritis    Bilateral ureteral calculi    Borderline type 2 diabetes mellitus    BPH (benign prostatic hypertrophy)    Chronic back pain    Condylomata acuminata in male    multiple procedures --  penile, peri-rectal , perineum   DDD (degenerative disc disease), cervical    Depression    Dyspnea on exertion    Emphysematous COPD (HCC)    Epiretinal membrane (ERM) of both eyes    Fibromyalgia    GERD (gastroesophageal reflux disease)    Headache(784.0)    History of chronic pancreatitis    severeal adx for this /  2008  dx alcoholic pancreatitis   History of esophageal dilatation    History of hiatal hernia    History of kidney stones    History of panic attacks    History of sepsis    adx  05-01-2014--  urosepsis due to kidney stones obstruction/ hydronephrosis   History of suicidal ideation    2006   adx   Hypertension    was on medication for short time, htn was caused by prednisone  per pt   Multiple sclerosis (HCC)    pt. states has 4 small brain lesions   Nephrolithiasis    bilateral    Retinal vasculitis    Uveitis     Family History  Problem Relation Age of Onset   Hypertension Mother    Breast cancer Mother    Melanoma Mother     Stroke Mother    Lung cancer Father 59   Colon cancer Neg Hx    Colitis Neg Hx    Cirrhosis Neg Hx    Liver disease Neg Hx    Pancreatic cancer Neg Hx    Pancreatitis Neg Hx    Anesthesia problems Neg Hx    Hypotension Neg Hx    Malignant hyperthermia Neg Hx    Pseudochol deficiency Neg Hx    Past Surgical History:  Procedure Laterality Date   BIOPSY  11/12/2012   Procedure: GASTRIC AND ESOPHAGEAL BIOPSIES;  Surgeon: Lamar CHRISTELLA Hollingshead, MD;  Location: [facility];  Service: Endoscopy;;   CATARACT EXTRACTION W/ INTRAOCULAR LENS  IMPLANT, BILATERAL     COLONOSCOPY  10/08/2011   Jenkins:Normal colon/Anal condyloma without extension proximal to dentate line   CYSTOSCOPY W/ URETERAL STENT PLACEMENT Bilateral 05/01/2014   Procedure: CYSTOSCOPY WITH BILATERAL RETROGRADE PYELOGRAM; BILATERAL URETERAL STENT PLACEMENT;  Surgeon: Alm Fragmin, MD;  Location: [facility];  Service: Urology;  Laterality: Bilateral;   CYSTOSCOPY W/ URETERAL STENT REMOVAL Bilateral 06/06/2014   Procedure: CYSTOSCOPY WITH STENT REMOVAL;  Surgeon: Garnette Shack, MD;  Location[facility];  Service: Urology;  Laterality: Bilateral;   CYSTOSCOPY WITH URETEROSCOPY  06/06/2014   Procedure: CYSTOSCOPY WITH URETEROSCOPY;  Surgeon: Garnette Shack, MD;  Location: [facility];  Service: Urology;;   CYSTOSCOPY WITH URETEROSCOPY AND STENT PLACEMENT Bilateral 06/06/2014   Procedure: CYSTOSCOPY WITH J2 STENT EXTRACTION,,URETEROSCOPY WITH EXTRACTION OF STONES,;  Surgeon: Garnette Shack, MD;  Location: [facility];  Service: Urology;  Laterality: Bilateral;   ELECTROCAUTERY/ DESICCATION OF CONDYLOMA LESIONS  01-15-2008  &  12-29-2009   PENIS, PERI-RECTAL , PERINUEM   ESOPHAGOGASTRODUODENOSCOPY (EGD) WITH PROPOFOL  N/A 11/12/2012   MFM:Wnmfjo esophagus s/p  passage of a Maloney dilator and biopsy. Abnormal gastric mucosa-status post biopsy   FLEXIBLE BRONCHOSCOPY N/A 08/12/2012   Procedure:  FLEXIBLE BRONCHOSCOPY;  Surgeon: Dallas LITTIE Gelineau, MD;  Location: [facility];  Service: Pulmonary;  Laterality: N/A;   MALONEY DILATION N/A 11/12/2012   Procedure: MALONEY DILATION (54mm);  Surgeon: Lamar CHRISTELLA Hollingshead, MD;  Location: [facility];  Service: Endoscopy;  Laterality: N/A;   MULTIPLE EXTRACTIONS WITH ALVEOLOPLASTY N/A 04/22/2014   Procedure: MULTIPLE EXTRACTIONS ( 2,3,5,6,7,8,9,10,11,13,14,15,21,28  WITH ALVEOLOPLASTY;  Surgeon: Glendia Primrose, DDS;  Location: [facility];  Service: Oral Surgery;  Laterality: N/A;   WISDOM TOOTH EXTRACTION     Social History   Social History Narrative   Right handed   Caffeine use: mostly water /OJ, tea sometimes.    Immunization History  Administered Date(s) Administered   Influenza,inj,Quad PF,6+ Mos 11/28/2017   Influenza-Unspecified 11/30/2013, 03/15/2015   PPD Test 08/18/2013     Objective: Vital Signs: There were no vitals taken for this visit.   Physical Exam   Musculoskeletal Exam: ***  CDAI Exam: CDAI Score: -- Patient Global: --; Provider Global: -- Swollen: --;  Tender: -- Joint Exam 10/16/2023   No joint exam has been documented for this visit   There is currently no information documented on the homunculus. Go to the Rheumatology activity and complete the homunculus joint exam.  Investigation: No additional findings.  Imaging: No results found.  Recent Labs: Lab Results  Component Value Date   WBC 4.1 02/21/2022   HGB 14.6 02/21/2022   PLT 154 02/21/2022   NA 134 (L) 02/21/2022   K 3.3 (L) 02/21/2022   CL 102 02/21/2022   CO2 24 02/21/2022   GLUCOSE 108 (H) 02/21/2022   BUN <5 (L) 02/21/2022   CREATININE 0.72 02/21/2022   BILITOT 0.7 02/21/2022   ALKPHOS 67 02/21/2022   AST 22 02/21/2022   ALT 17 02/21/2022   PROT 6.0 (L) 02/21/2022   ALBUMIN 3.0 (L) 02/21/2022   CALCIUM  8.2 (L) 02/21/2022   GFRAA >60 09/17/2019    Speciality Comments: No specialty comments available.  Procedures:  No procedures  performed Allergies: Patient has no known allergies.   Assessment / Plan:     Visit Diagnoses: No diagnosis found.  Orders: No orders of the defined types were placed in this encounter.  No orders of the defined types were placed in this encounter.   Face-to-face time spent with patient was *** minutes. Greater than 50% of time was spent in counseling and coordination of care.  Follow-Up Instructions: No follow-ups on file.   Lonni LELON Ester, MD  Note - This record has been created using [employer].  Chart creation errors have been sought, but may not always  have been located. Such creation errors do not reflect on  the standard of medical care.

## 2023-11-20 ENCOUNTER — Encounter (HOSPITAL_COMMUNITY): Payer: Self-pay | Admitting: Emergency Medicine

## 2023-11-20 ENCOUNTER — Emergency Department (HOSPITAL_COMMUNITY)

## 2023-11-20 ENCOUNTER — Emergency Department (HOSPITAL_COMMUNITY)
Admission: EM | Admit: 2023-11-20 | Discharge: 2023-11-20 | Disposition: A | Discharge status: Other Acute Inpatient Hospital | Attending: Emergency Medicine | Admitting: Emergency Medicine

## 2023-11-20 ENCOUNTER — Emergency Department (HOSPITAL_COMMUNITY)
Admission: EM | Admit: 2023-11-20 | Discharge: 2023-11-22 | Disposition: A | Discharge status: Other Acute Inpatient Hospital | Attending: Emergency Medicine | Admitting: Emergency Medicine

## 2023-11-20 ENCOUNTER — Encounter (HOSPITAL_COMMUNITY): Payer: Self-pay | Admitting: *Deleted

## 2023-11-20 ENCOUNTER — Other Ambulatory Visit: Payer: Self-pay

## 2023-11-20 DIAGNOSIS — G35A Relapsing-remitting multiple sclerosis: Secondary | ICD-10-CM | POA: Insufficient documentation

## 2023-11-20 DIAGNOSIS — R45851 Suicidal ideations: Secondary | ICD-10-CM | POA: Insufficient documentation

## 2023-11-20 DIAGNOSIS — Z720 Tobacco use: Secondary | ICD-10-CM | POA: Insufficient documentation

## 2023-11-20 DIAGNOSIS — I700 Atherosclerosis of aorta: Secondary | ICD-10-CM | POA: Insufficient documentation

## 2023-11-20 DIAGNOSIS — R197 Diarrhea, unspecified: Secondary | ICD-10-CM | POA: Insufficient documentation

## 2023-11-20 DIAGNOSIS — F339 Major depressive disorder, recurrent, unspecified: Secondary | ICD-10-CM | POA: Diagnosis present

## 2023-11-20 DIAGNOSIS — G8929 Other chronic pain: Secondary | ICD-10-CM | POA: Diagnosis not present

## 2023-11-20 DIAGNOSIS — R1032 Left lower quadrant pain: Secondary | ICD-10-CM | POA: Insufficient documentation

## 2023-11-20 DIAGNOSIS — K861 Other chronic pancreatitis: Secondary | ICD-10-CM | POA: Insufficient documentation

## 2023-11-20 DIAGNOSIS — R102 Pelvic and perineal pain unspecified side: Secondary | ICD-10-CM | POA: Diagnosis not present

## 2023-11-20 DIAGNOSIS — Z7951 Long term (current) use of inhaled steroids: Secondary | ICD-10-CM | POA: Diagnosis not present

## 2023-11-20 DIAGNOSIS — Z931 Gastrostomy status: Secondary | ICD-10-CM | POA: Diagnosis not present

## 2023-11-20 DIAGNOSIS — F4321 Adjustment disorder with depressed mood: Secondary | ICD-10-CM | POA: Diagnosis not present

## 2023-11-20 LAB — URINALYSIS, ROUTINE W REFLEX MICROSCOPIC
Bilirubin Urine: NEGATIVE
Glucose, UA: NEGATIVE mg/dL
Hgb urine dipstick: NEGATIVE
Ketones, ur: 5 mg/dL — AB
Leukocytes,Ua: NEGATIVE
Nitrite: NEGATIVE
Protein, ur: NEGATIVE mg/dL
Specific Gravity, Urine: 1.01 (ref 1.005–1.030)
pH: 5 (ref 5.0–8.0)

## 2023-11-20 LAB — COMPREHENSIVE METABOLIC PANEL WITH GFR
ALT: 13 U/L (ref 0–44)
AST: 25 U/L (ref 15–41)
Albumin: 4.2 g/dL (ref 3.5–5.0)
Alkaline Phosphatase: 85 U/L (ref 38–126)
Anion gap: 17 — ABNORMAL HIGH (ref 5–15)
BUN: 7 mg/dL (ref 6–20)
CO2: 25 mmol/L (ref 22–32)
Calcium: 9.3 mg/dL (ref 8.9–10.3)
Chloride: 105 mmol/L (ref 98–111)
Creatinine, Ser: 0.67 mg/dL (ref 0.61–1.24)
GFR, Estimated: >60 mL/min (ref >60)
Glucose, Bld: 58 mg/dL — ABNORMAL LOW (ref 70–99)
Potassium: 3.8 mmol/L (ref 3.5–5.1)
Sodium: 146 mmol/L — ABNORMAL HIGH (ref 135–145)
Total Bilirubin: 0.4 mg/dL (ref 0.0–1.2)
Total Protein: 6.9 g/dL (ref 6.5–8.1)

## 2023-11-20 LAB — CBC WITH DIFFERENTIAL/PLATELET
Abs Immature Granulocytes: 0.05 K/uL (ref 0.00–0.07)
Basophils Absolute: 0.1 K/uL (ref 0.0–0.1)
Basophils Relative: 1 %
Eosinophils Absolute: 0.1 K/uL (ref 0.0–0.5)
Eosinophils Relative: 1 %
HCT: 46.1 % (ref 39.0–52.0)
Hemoglobin: 16 g/dL (ref 13.0–17.0)
Immature Granulocytes: 1 %
Lymphocytes Relative: 15 %
Lymphs Abs: 1.3 K/uL (ref 0.7–4.0)
MCH: 32.1 pg (ref 26.0–34.0)
MCHC: 34.7 g/dL (ref 30.0–36.0)
MCV: 92.4 fL (ref 80.0–100.0)
Monocytes Absolute: 0.7 K/uL (ref 0.1–1.0)
Monocytes Relative: 8 %
Neutro Abs: 6.8 K/uL (ref 1.7–7.7)
Neutrophils Relative %: 74 %
Platelets: 304 K/uL (ref 150–400)
RBC: 4.99 MIL/uL (ref 4.22–5.81)
RDW: 13.7 % (ref 11.5–15.5)
WBC: 9 K/uL (ref 4.0–10.5)
nRBC: 0 % (ref 0.0–0.2)

## 2023-11-20 LAB — LIPASE, BLOOD: Lipase: 64 U/L — ABNORMAL HIGH (ref 11–51)

## 2023-11-20 MED ORDER — NAPROXEN 500 MG PO TABS: 500.0000 mg | ORAL_TABLET | Freq: Two times a day (BID) | ORAL | 0 refills | Status: DC

## 2023-11-20 MED ORDER — MORPHINE SULFATE (PF) 4 MG/ML IV SOLN
4.0000 mg | Freq: Once | INTRAVENOUS | Status: AC
  Administered 2023-11-20: 4 mg via INTRAVENOUS
  Filled 2023-11-20: qty 1

## 2023-11-20 MED ORDER — IOHEXOL 300 MG/ML  SOLN
100.0000 mL | Freq: Once | INTRAMUSCULAR | Status: AC | PRN
  Administered 2023-11-20: 100 mL via INTRAVENOUS

## 2023-11-20 NOTE — ED Triage Notes (Signed)
 Pt requesting psychiatric evaluation after recent DC. Pt states he is having a mental breakdown due to family problems and being locked out of his house. Pt states feeling depressed or anxious. Denies SI/HI at this time.

## 2023-11-20 NOTE — ED Triage Notes (Signed)
 Pt BIB [facility] for [facility]Q x 3 months. Reported pt with feeding tube and has not been changed since 3 months. Pt denies any N/V/ D

## 2023-11-20 NOTE — ED Notes (Signed)
 [facility] up. Pt discharged and sent to waiting room.

## 2023-11-20 NOTE — ED Notes (Signed)
 Pt talking about needing to talk to a therapist and also talking about needing pain medications.

## 2023-11-20 NOTE — Discharge Instructions (Signed)
 Your CT scan was normal, it did show findings of chronic pancreatitis but there is no active problems especially down lower in your abdomen.  Your blood work was normal urine was normal, vital signs are normal, I have prescribed Naprosyn twice a day to help with pain, you can follow-up with your general surgeon or gastroenterologist as needed, preferably within the week if your pain continues

## 2023-11-20 NOTE — ED Provider Notes (Signed)
 [name] Provider Note   CSN: [id number] Arrival date & time: 11/20/23  1614     Patient presents with: Abdominal Pain   Brent [name] is a 53 y.o. male.    Abdominal Pain  This patient is a chronically ill-appearing 53 year old male, he has a history of chronic pancreatitis to the point where he had a G-tube placed in the past, it was most recently replaced several months ago and he was told at that time that he would have some abdominal pain when it was placed but he has continued to have chronic abdominal pain in the left lower quadrant in the pelvis almost every day, he takes chronic nausea medicine, he has not had any changes in his bowel habits and has occasional diarrhea.  No difficulty urinating.  He presents with left lower quadrant and pelvic pain which has been going on for 3 months today.  No fevers or chills    Prior to Admission medications   Medication Sig Start Date End Date Taking? Authorizing Provider  naproxen (NAPROSYN) 500 MG tablet Take 1 tablet (500 mg total) by mouth 2 (two) times daily with a meal. 11/20/23  Yes Cleotilde Redell, MD  ALPRAZolam  (XANAX ) 1 MG tablet Take 1 tablet (1 mg total) by mouth 2 (two) times daily as needed for anxiety. Patient taking differently: Take 1 mg by mouth 4 (four) times daily as needed for anxiety. 09/17/19   Johnson, Clanford L, MD  cetirizine (ZYRTEC) 10 MG tablet Take 10 mg by mouth daily. 11/23/21   [provider]  Cholecalciferol  (VITAMIN D  HIGH POTENCY) 25 MCG (1000 UT) capsule Take 1,000 Units by mouth daily.    [provider]  CREON  3000-9500 units CPEP Take 1 capsule by mouth 3 (three) times daily with meals. 05/17/19   [provider]  cyanocobalamin (VITAMIN B12) 1000 MCG tablet Take 2,000 mcg by mouth daily. 11/23/21   [provider]  docusate sodium (COLACE) 100 MG capsule Take 100 mg by mouth daily as needed for mild constipation.  11/23/21   [provider]  gabapentin  (NEURONTIN ) 600 MG tablet Take 600 mg by mouth 4 (four) times daily. 11/23/21   [provider]  Glatiramer  Acetate (COPAXONE ) 40 MG/ML SOSY One po three times a week 06/03/22   Sater, Charlie LABOR, MD  HYDROcodone -acetaminophen  (NORCO) 10-325 MG tablet Take 1 tablet by mouth every 6 (six) hours as needed for severe pain. For pain 09/17/19   Johnson, Clanford L, MD  ketorolac  (ACULAR ) 0.5 % ophthalmic solution Place 2 drops into both eyes 4 (four) times daily.    [provider]  omeprazole (PRILOSEC) 20 MG capsule Take 20 mg by mouth daily. 09/10/22   [provider]  ondansetron  (ZOFRAN ) 4 MG tablet Take 4 mg by mouth every 8 (eight) hours as needed for nausea.  05/28/19   [provider]  prednisoLONE  acetate (PRED FORTE ) 1 % ophthalmic suspension Place 2 drops into both eyes 2 (two) times daily.  01/24/15   [provider]  PROAIR  DIGIHALER 108 (90 Base) MCG/ACT AEPB Inhale 2 puffs into the lungs every 6 (six) hours as needed. 04/10/22   [provider]  SYMBICORT 80-4.5 MCG/ACT inhaler Inhale 1 puff into the lungs 2 (two) times daily. 04/23/19   [provider]  zolpidem  (AMBIEN ) 10 MG tablet Take 10 mg by mouth at bedtime as needed for sleep.    [provider]    Allergies:  Patient has no known allergies.    Review of Systems  Gastrointestinal:  Positive for abdominal pain.  All other systems reviewed and are negative.   Updated Vital Signs BP 107/73   Pulse 95   Temp 97.8 F (36.6 C)   Resp 18   Ht 1.702 m (5' 7)   Wt 46.7 kg   SpO2 96%   BMI 16.13 kg/m   Physical Exam Vitals and nursing note reviewed.  Constitutional:      General: He is not in acute distress.    Appearance: He is well-developed.  HENT:     Head: Normocephalic and atraumatic.     Mouth/Throat:     Pharynx: No oropharyngeal exudate.  Eyes:     General: No scleral icterus.       Right eye:  No discharge.        Left eye: No discharge.     Conjunctiva/sclera: Conjunctivae normal.     Pupils: Pupils are equal, round, and reactive to light.  Neck:     Thyroid : No thyromegaly.     Vascular: No JVD.  Cardiovascular:     Rate and Rhythm: Normal rate and regular rhythm.     Heart sounds: Normal heart sounds. No murmur heard.    No friction rub. No gallop.  Pulmonary:     Effort: Pulmonary effort is normal. No respiratory distress.     Breath sounds: Normal breath sounds. No wheezing or rales.  Abdominal:     General: Bowel sounds are normal. There is no distension.     Palpations: Abdomen is soft. There is no mass.     Tenderness: There is no abdominal tenderness.  Musculoskeletal:        General: No tenderness. Normal range of motion.     Cervical back: Normal range of motion and neck supple.  Lymphadenopathy:     Cervical: No cervical adenopathy.  Skin:    General: Skin is warm and dry.     Findings: No erythema or rash.  Neurological:     Mental Status: He is alert.     Coordination: Coordination normal.  Psychiatric:        Behavior: Behavior normal.     (all labs ordered are listed, but only abnormal results are displayed) Labs Reviewed  COMPREHENSIVE METABOLIC PANEL WITH GFR - Abnormal; Notable for the following components:      Result Value   Sodium 146 (*)    Glucose, Bld 58 (*)    Anion gap 17 (*)    All other components within normal limits  LIPASE, BLOOD - Abnormal; Notable for the following components:   Lipase 64 (*)    All other components within normal limits  URINALYSIS, ROUTINE W REFLEX MICROSCOPIC - Abnormal; Notable for the following components:   Ketones, ur 5 (*)    All other components within normal limits  CBC WITH DIFFERENTIAL/PLATELET    EKG: None  Radiology: CT ABDOMEN PELVIS W CONTRAST Result Date: 11/20/2023 CLINICAL DATA:  Left lower quadrant pain for several weeks, initial encounter EXAM: CT ABDOMEN AND PELVIS WITH CONTRAST  TECHNIQUE: Multidetector CT imaging of the abdomen and pelvis was performed using the standard protocol following bolus administration of intravenous contrast. RADIATION DOSE REDUCTION: This exam was performed according to the departmental dose-optimization program which includes automated exposure control, adjustment of the mA and/or kV according to patient size and/or use of iterative reconstruction technique. CONTRAST:  OMNIPAQUE  IOHEXOL  300 MG/ML  SOLN COMPARISON:  02/21/2022  FINDINGS: Lower chest: Lung bases are free of acute infiltrate or sizable effusion. Hepatobiliary: No focal liver abnormality is seen. No gallstones, gallbladder wall thickening, or biliary dilatation. Pancreas: Pancreas demonstrates significant scattered calcifications increased when compared with the prior exam. Pancreatic ductal dilatation is seen although no discrete mass is noted. These changes are consistent with chronic pancreatitis. No acute component is noted. Spleen: Normal in size without focal abnormality. Adrenals/Urinary Tract: Adrenal glands are within normal limits. Kidneys demonstrate a normal enhancement pattern bilaterally. No renal calculi or obstructive changes are seen. The bladder is decompressed. Stomach/Bowel: No obstructive or inflammatory changes of the colon are noted. Dense barium is noted throughout the colon. The appendix is not discretely visualized and may have been surgically removed. No inflammatory changes are noted. Small bowel appears within normal limits. Gastric lumen is decompressed. 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 Vascular/Lymphatic: Aortic atherosclerosis. No enlarged abdominal or pelvic lymph nodes. Reproductive: Prostate is unremarkable. Other: No abdominal wall hernia or abnormality. No abdominopelvic ascites. Musculoskeletal: No acute or significant osseous findings. IMPRESSION: Changes consistent with chronic pancreatitis relatively  stable from the prior exam. No acute abnormality is identified. Electronically Signed   By: Oneil Devonshire M.D.   On: 11/20/2023 19:46     Procedures   Medications Ordered in the ED  morphine  (PF) 4 MG/ML injection 4 mg (4 mg Intravenous Given 11/20/23 1850)  iohexol  (OMNIPAQUE ) 300 MG/ML solution 100 mL (100 mLs Intravenous Contrast Given 11/20/23 1929)                                    Medical Decision Making Amount and/or Complexity of Data Reviewed Labs: ordered. Radiology: ordered.  Risk Prescription drug management.    This patient presents to the ED for concern of abdominal pain, this involves an extensive number of treatment options, and is a complaint that carries with it a high risk of complications and morbidity.  The differential diagnosis includes chronic pain, pancreatitis, intra-abdominal abscess, diverticulitis, cystitis, chronic pain   Co morbidities / Chronic conditions that complicate the patient evaluation  Chronic pancreatitis   Additional history obtained:  Additional history obtained from EMR External records from outside source obtained and reviewed including prior CT scan reports from prior workups of chest abdomen and pelvis   Lab Tests:  I Ordered, and personally interpreted labs.  The pertinent results include: Lipase just above normal, other labs unremarkable, urine clean   Imaging Studies ordered:  I ordered imaging studies including CT scan of the abdomen and pelvis I independently visualized and interpreted imaging which showed chronic pancreatitis findings but no acute findings especially in the lower pelvis I agree with the radiologist interpretation   Cardiac Monitoring: / EKG:  The patient was maintained on a cardiac monitor.  I personally viewed and interpreted the cardiac monitored which showed an underlying rhythm of: Normal sinus rhythm   Problem List / ED Course / Critical interventions / Medication management  Morphine  I  ordered medication including morphine  Reevaluation of the patient after these medicines showed that the patient improved I have reviewed the patients home medicines and have made adjustments as needed    Social Determinants of Health:  Chronic pancreatitis   Test / Admission - Considered:  Considered admission but patient has a negative CT labs and vital signs, stable for discharge  I have discussed with the patient  at the bedside the results, and the meaning of these results.  They have had opportunity to ask questions,  expressed their understanding to the need for follow-up with primary care physician      Final diagnoses:  Chronic abdominal pain    ED Discharge Orders          Ordered    naproxen (NAPROSYN) 500 MG tablet  2 times daily with meals        11/20/23 1950               Cleotilde Rogue, MD 11/20/23 1952

## 2023-11-20 NOTE — ED Notes (Signed)
 Pt care taken, said he just does not want to wake up. Has been out of his xanax  since last night.
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
## 2023-11-21 DIAGNOSIS — F339 Major depressive disorder, recurrent, unspecified: Secondary | ICD-10-CM

## 2023-11-21 DIAGNOSIS — F4321 Adjustment disorder with depressed mood: Secondary | ICD-10-CM

## 2023-11-21 DIAGNOSIS — R1032 Left lower quadrant pain: Secondary | ICD-10-CM | POA: Diagnosis not present

## 2023-11-21 LAB — URINE DRUG SCREEN
Amphetamines: NEGATIVE
Barbiturates: NEGATIVE
Benzodiazepines: POSITIVE — AB
Cocaine: NEGATIVE
Fentanyl: NEGATIVE
Methadone Scn, Ur: NEGATIVE
Opiates: NEGATIVE
Tetrahydrocannabinol: NEGATIVE

## 2023-11-21 LAB — RESP PANEL BY RT-PCR (RSV, FLU A&B, COVID)  RVPGX2
Influenza A by PCR: NEGATIVE
Influenza B by PCR: NEGATIVE
Resp Syncytial Virus by PCR: NEGATIVE
SARS Coronavirus 2 by RT PCR: NEGATIVE

## 2023-11-21 MED ORDER — DOCUSATE SODIUM 100 MG PO CAPS
100.0000 mg | ORAL_CAPSULE | Freq: Every day | ORAL | Status: DC
  Administered 2023-11-21 – 2023-11-22 (×2): 100 mg via ORAL
  Filled 2023-11-21 (×2): qty 1

## 2023-11-21 MED ORDER — NICOTINE 14 MG/24HR TD PT24
14.0000 mg | MEDICATED_PATCH | Freq: Every day | TRANSDERMAL | Status: DC
  Administered 2023-11-21 – 2023-11-22 (×2): 14 mg via TRANSDERMAL
  Filled 2023-11-21 (×2): qty 1

## 2023-11-21 MED ORDER — PANCRELIPASE (LIP-PROT-AMYL) 36000-114000 UNITS PO CPEP
36000.0000 [IU] | ORAL_CAPSULE | Freq: Three times a day (TID) | ORAL | Status: DC
  Administered 2023-11-21 – 2023-11-22 (×3): 36000 [IU] via ORAL
  Filled 2023-11-21 (×3): qty 1

## 2023-11-21 MED ORDER — VITAMIN B-12 1000 MCG PO TABS
2000.0000 ug | ORAL_TABLET | Freq: Every day | ORAL | Status: DC
Start: 2023-11-21 — End: 2023-11-22
  Administered 2023-11-21 – 2023-11-22 (×2): 2000 ug via ORAL
  Filled 2023-11-21 (×2): qty 2

## 2023-11-21 MED ORDER — VITAMIN D 25 MCG (1000 UNIT) PO TABS
1000.0000 [IU] | ORAL_TABLET | Freq: Every day | ORAL | Status: DC
  Administered 2023-11-21 – 2023-11-22 (×2): 1000 [IU] via ORAL
  Filled 2023-11-21 (×2): qty 1

## 2023-11-21 MED ORDER — ALPRAZOLAM 0.5 MG PO TABS
1.0000 mg | ORAL_TABLET | Freq: Three times a day (TID) | ORAL | Status: DC | PRN
  Administered 2023-11-21 – 2023-11-22 (×4): 1 mg via ORAL
  Filled 2023-11-21 (×4): qty 2

## 2023-11-21 MED ORDER — ACETAMINOPHEN 500 MG PO TABS
1000.0000 mg | ORAL_TABLET | Freq: Once | ORAL | Status: AC
  Administered 2023-11-21: 1000 mg via ORAL
  Filled 2023-11-21: qty 2

## 2023-11-21 MED ORDER — FLUOXETINE HCL 10 MG PO CAPS
10.0000 mg | ORAL_CAPSULE | Freq: Every day | ORAL | Status: DC
  Administered 2023-11-21 – 2023-11-22 (×2): 10 mg via ORAL
  Filled 2023-11-21 (×2): qty 1

## 2023-11-21 MED ORDER — ALPRAZOLAM 0.5 MG PO TABS
1.0000 mg | ORAL_TABLET | Freq: Once | ORAL | Status: AC
  Administered 2023-11-21: 1 mg via ORAL
  Filled 2023-11-21: qty 2

## 2023-11-21 MED ORDER — TRAZODONE HCL 50 MG PO TABS
100.0000 mg | ORAL_TABLET | Freq: Every day | ORAL | Status: DC
  Administered 2023-11-21: 100 mg via ORAL
  Filled 2023-11-21: qty 2

## 2023-11-21 MED ORDER — ACETAMINOPHEN 325 MG PO TABS
650.0000 mg | ORAL_TABLET | Freq: Once | ORAL | Status: AC
Start: 2023-11-21 — End: 2023-11-21
  Administered 2023-11-21: 650 mg via ORAL
  Filled 2023-11-21: qty 2

## 2023-11-21 MED ORDER — PANCRELIPASE (LIP-PROT-AMYL) 36000-114000 UNITS PO CPEP: 36000.0000 [IU] | ORAL_CAPSULE | Freq: Three times a day (TID) | ORAL | Status: DC

## 2023-11-21 NOTE — Consult Note (Signed)
 Patient was evaluated by psychiatry, continues to endorse suicidal ideation, is guarded about his plan and intent and is unable to contract for safety.  He is referred for psychiatric admission.    Complete consult note to follow.

## 2023-11-21 NOTE — Progress Notes (Addendum)
 Update: [name] rescinded bed offer due to pt's g-tube. SW will continue to fax out    Pt has been accepted to [facility] on 11/21/2023 . Bed assignment: Brent [name]   Pt meets inpatient criteria per Bernadette Barefoot, NP   Attending Physician will be Dr. Carmin   Report can be called to: [phone]   Pt can arrive after 8AM   Care Team Notified: Jonette Pitt, RN, Donnice Bars, Paramedic,  Bernadette Barefoot, NP,

## 2023-11-21 NOTE — ED Notes (Signed)
 Spoke with [employer] Coordinator [phone] [facility] in [place]. Spoke with nurse regarding psychiatric evaluation and placement to facility.Informed nurse patient required to have COVID and drug screening before acceptance. Notified MD.

## 2023-11-21 NOTE — Progress Notes (Signed)
 Inpatient Psychiatric Referral  Patient was recommended inpatient per Bernadette Barefoot, NP  . There are no available beds at [facility], per [facility] . Patient was referred to the following out of network facilities:  Destination  Service Provider Address Phone Fax  [facility]  [address], [place] [address] [phone]  [facility] [address]  [address], [place] [address] [phone]  [facility]  [address] [place], [place] [address] [phone]  [facility]  [address] [place] [address] [phone]  [facility]  [address] [place] [address] [phone]  [facility]  [address], [place] [address] [phone]  [facility] EFAX  [address] [place], [place] [phone]  [facility]  [address], [place] [address] [phone]  [facility]  [address] [place] [address] [phone]    Situation ongoing, CSW to continue following and update chart as more information becomes available.   Harrie Sofia MSW, LCSWA 11/21/2023

## 2023-11-21 NOTE — ED Notes (Signed)
 Pt requesting a xanax . Stated, they help me calm down so much. I have nightmares when I sleep.

## 2023-11-21 NOTE — ED Notes (Signed)
 Tele-sitter confirmed they have visual on the pt.

## 2023-11-21 NOTE — ED Provider Notes (Signed)
 Emergency Medicine Observation Re-evaluation Note  RIEL HIRSCHMAN is a 53 y.o. male, seen on rounds today.  Pt initially presented to the ED for complaints of Psychiatric Evaluation Currently, the patient is resting.  Physical Exam  BP 107/84 (BP Location: Right Arm)   Pulse 99   Temp 98.4 F (36.9 C) (Oral)   Resp 18   Ht 5' 7 (1.702 m)   Wt 46.7 kg   SpO2 97%   BMI 16.13 kg/m  Physical Exam General: nad   ED Course / MDM  EKG:   I have reviewed the labs performed to date as well as medications administered while in observation.  Recent changes in the last 24 hours include None .  Plan  Current plan is for  psych consult lin the AM. Medically clear  Pending placement by psychiatry.   Simon Lavonia SAILOR, MD 11/21/23 [phone]
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
## 2023-11-21 NOTE — ED Notes (Signed)
 Pt's brother Suhas Estis- emergency contact) called to receive updates. Stated, please do not give my brother my number.

## 2023-11-21 NOTE — ED Notes (Addendum)
 Pt stated, I'm in pain. This tube is what's causing all my problems. This is why I'm here. I think gabapentin  will help. Do you think I am able to talk to the surgeon at [facility].   This nurse told pt she would express his concerns to the provider. LUQ insertion site  appears intact, dry, and free of bleeding at this time.
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
## 2023-11-21 NOTE — ED Notes (Signed)
 Tele-sitter set up and plugged in. Waiting to hear back for visual on the pt.

## 2023-11-22 ENCOUNTER — Inpatient Hospital Stay
Admission: AD | Admit: 2023-11-22 | Discharge: 2023-11-27 | DRG: 885 | Disposition: A | Discharge status: Home / Self Care | Source: Intra-hospital | Attending: Psychiatry | Admitting: Psychiatry

## 2023-11-22 ENCOUNTER — Encounter: Payer: Self-pay | Admitting: Psychiatry

## 2023-11-22 ENCOUNTER — Other Ambulatory Visit: Payer: Self-pay

## 2023-11-22 DIAGNOSIS — Z961 Presence of intraocular lens: Secondary | ICD-10-CM | POA: Diagnosis present

## 2023-11-22 DIAGNOSIS — Z62810 Personal history of physical and sexual abuse in childhood: Secondary | ICD-10-CM | POA: Diagnosis not present

## 2023-11-22 DIAGNOSIS — Z87442 Personal history of urinary calculi: Secondary | ICD-10-CM | POA: Diagnosis not present

## 2023-11-22 DIAGNOSIS — Z8249 Family history of ischemic heart disease and other diseases of the circulatory system: Secondary | ICD-10-CM

## 2023-11-22 DIAGNOSIS — J439 Emphysema, unspecified: Secondary | ICD-10-CM | POA: Diagnosis present

## 2023-11-22 DIAGNOSIS — F17210 Nicotine dependence, cigarettes, uncomplicated: Secondary | ICD-10-CM | POA: Diagnosis present

## 2023-11-22 DIAGNOSIS — F332 Major depressive disorder, recurrent severe without psychotic features: Secondary | ICD-10-CM | POA: Diagnosis present

## 2023-11-22 DIAGNOSIS — F410 Panic disorder [episodic paroxysmal anxiety] without agoraphobia: Secondary | ICD-10-CM | POA: Diagnosis present

## 2023-11-22 DIAGNOSIS — M1990 Unspecified osteoarthritis, unspecified site: Secondary | ICD-10-CM | POA: Diagnosis present

## 2023-11-22 DIAGNOSIS — K219 Gastro-esophageal reflux disease without esophagitis: Secondary | ICD-10-CM | POA: Diagnosis present

## 2023-11-22 DIAGNOSIS — Z934 Other artificial openings of gastrointestinal tract status: Secondary | ICD-10-CM | POA: Diagnosis not present

## 2023-11-22 DIAGNOSIS — Z7951 Long term (current) use of inhaled steroids: Secondary | ICD-10-CM

## 2023-11-22 DIAGNOSIS — Z791 Long term (current) use of non-steroidal anti-inflammatories (NSAID): Secondary | ICD-10-CM

## 2023-11-22 DIAGNOSIS — F4000 Agoraphobia, unspecified: Secondary | ICD-10-CM | POA: Diagnosis present

## 2023-11-22 DIAGNOSIS — Z5982 Transportation insecurity: Secondary | ICD-10-CM

## 2023-11-22 DIAGNOSIS — G8929 Other chronic pain: Secondary | ICD-10-CM | POA: Diagnosis present

## 2023-11-22 DIAGNOSIS — Z9841 Cataract extraction status, right eye: Secondary | ICD-10-CM

## 2023-11-22 DIAGNOSIS — Z79899 Other long term (current) drug therapy: Secondary | ICD-10-CM

## 2023-11-22 DIAGNOSIS — Z5948 Other specified lack of adequate food: Secondary | ICD-10-CM

## 2023-11-22 DIAGNOSIS — I10 Essential (primary) hypertension: Secondary | ICD-10-CM | POA: Diagnosis present

## 2023-11-22 DIAGNOSIS — Z681 Body mass index (BMI) 19 or less, adult: Secondary | ICD-10-CM

## 2023-11-22 DIAGNOSIS — M797 Fibromyalgia: Secondary | ICD-10-CM | POA: Diagnosis present

## 2023-11-22 DIAGNOSIS — G35D Multiple sclerosis, unspecified: Secondary | ICD-10-CM | POA: Diagnosis present

## 2023-11-22 DIAGNOSIS — R1032 Left lower quadrant pain: Secondary | ICD-10-CM | POA: Diagnosis not present

## 2023-11-22 DIAGNOSIS — F4010 Social phobia, unspecified: Secondary | ICD-10-CM | POA: Diagnosis present

## 2023-11-22 DIAGNOSIS — Z5941 Food insecurity: Secondary | ICD-10-CM | POA: Diagnosis not present

## 2023-11-22 DIAGNOSIS — Z8619 Personal history of other infectious and parasitic diseases: Secondary | ICD-10-CM

## 2023-11-22 DIAGNOSIS — K30 Functional dyspepsia: Secondary | ICD-10-CM | POA: Diagnosis present

## 2023-11-22 DIAGNOSIS — R7303 Prediabetes: Secondary | ICD-10-CM | POA: Diagnosis present

## 2023-11-22 DIAGNOSIS — R630 Anorexia: Secondary | ICD-10-CM | POA: Diagnosis present

## 2023-11-22 DIAGNOSIS — R45851 Suicidal ideations: Secondary | ICD-10-CM | POA: Diagnosis present

## 2023-11-22 DIAGNOSIS — N400 Enlarged prostate without lower urinary tract symptoms: Secondary | ICD-10-CM | POA: Diagnosis present

## 2023-11-22 DIAGNOSIS — Z9842 Cataract extraction status, left eye: Secondary | ICD-10-CM

## 2023-11-22 DIAGNOSIS — R633 Feeding difficulties, unspecified: Secondary | ICD-10-CM | POA: Diagnosis present

## 2023-11-22 MED ORDER — DOCUSATE SODIUM 100 MG PO CAPS
100.0000 mg | ORAL_CAPSULE | Freq: Every day | ORAL | Status: DC
  Administered 2023-11-23 – 2023-11-27 (×5): 100 mg via ORAL
  Filled 2023-11-22 (×5): qty 1

## 2023-11-22 MED ORDER — ACETAMINOPHEN 325 MG PO TABS
650.0000 mg | ORAL_TABLET | Freq: Four times a day (QID) | ORAL | Status: DC | PRN
  Administered 2023-11-23 – 2023-11-27 (×8): 650 mg via ORAL
  Filled 2023-11-22 (×8): qty 2

## 2023-11-22 MED ORDER — DIPHENHYDRAMINE HCL 25 MG PO CAPS: 50.0000 mg | ORAL_CAPSULE | Freq: Three times a day (TID) | ORAL | Status: DC | PRN

## 2023-11-22 MED ORDER — HALOPERIDOL 5 MG PO TABS
5.0000 mg | ORAL_TABLET | Freq: Three times a day (TID) | ORAL | Status: DC | PRN
  Administered 2023-11-26: 5 mg via ORAL
  Filled 2023-11-22: qty 1

## 2023-11-22 MED ORDER — NICOTINE 14 MG/24HR TD PT24
14.0000 mg | MEDICATED_PATCH | Freq: Every day | TRANSDERMAL | Status: DC
Start: 2023-11-23 — End: 2023-11-27
  Administered 2023-11-23 – 2023-11-27 (×5): 14 mg via TRANSDERMAL
  Filled 2023-11-22 (×5): qty 1

## 2023-11-22 MED ORDER — HALOPERIDOL LACTATE 5 MG/ML IJ SOLN: 10.0000 mg | Freq: Three times a day (TID) | INTRAMUSCULAR | Status: DC | PRN

## 2023-11-22 MED ORDER — ENSURE PLUS HIGH PROTEIN PO LIQD
237.0000 mL | Freq: Three times a day (TID) | ORAL | Status: DC
Start: 2023-11-22 — End: 2023-11-25
  Administered 2023-11-24 – 2023-11-25 (×2): 237 mL via ORAL

## 2023-11-22 MED ORDER — ALPRAZOLAM 0.5 MG PO TABS
1.0000 mg | ORAL_TABLET | Freq: Three times a day (TID) | ORAL | Status: DC | PRN
  Administered 2023-11-22 – 2023-11-27 (×13): 1 mg via ORAL
  Filled 2023-11-22 (×13): qty 2

## 2023-11-22 MED ORDER — PANCRELIPASE (LIP-PROT-AMYL) 36000-114000 UNITS PO CPEP
36000.0000 [IU] | ORAL_CAPSULE | Freq: Three times a day (TID) | ORAL | Status: DC
  Administered 2023-11-23 – 2023-11-27 (×14): 36000 [IU] via ORAL
  Filled 2023-11-22 (×16): qty 1

## 2023-11-22 MED ORDER — LORAZEPAM 2 MG/ML IJ SOLN: 2.0000 mg | Freq: Three times a day (TID) | INTRAMUSCULAR | Status: DC | PRN

## 2023-11-22 MED ORDER — VITAMIN B-12 1000 MCG PO TABS
2000.0000 ug | ORAL_TABLET | Freq: Every day | ORAL | Status: DC
  Administered 2023-11-23 – 2023-11-27 (×5): 2000 ug via ORAL
  Filled 2023-11-22 (×5): qty 2

## 2023-11-22 MED ORDER — FLUOXETINE HCL 10 MG PO CAPS
10.0000 mg | ORAL_CAPSULE | Freq: Every day | ORAL | Status: DC
  Administered 2023-11-23: 10 mg via ORAL
  Filled 2023-11-22: qty 1

## 2023-11-22 MED ORDER — DIPHENHYDRAMINE HCL 50 MG/ML IJ SOLN: 50.0000 mg | Freq: Three times a day (TID) | INTRAMUSCULAR | Status: DC | PRN

## 2023-11-22 MED ORDER — ALUM & MAG HYDROXIDE-SIMETH 200-200-20 MG/5ML PO SUSP
30.0000 mL | ORAL | Status: DC | PRN
  Administered 2023-11-26: 30 mL via ORAL
  Filled 2023-11-22 (×2): qty 30

## 2023-11-22 MED ORDER — VITAMIN D 25 MCG (1000 UNIT) PO TABS
1000.0000 [IU] | ORAL_TABLET | Freq: Every day | ORAL | Status: DC
  Administered 2023-11-23 – 2023-11-27 (×5): 1000 [IU] via ORAL
  Filled 2023-11-22 (×5): qty 1

## 2023-11-22 MED ORDER — HALOPERIDOL LACTATE 5 MG/ML IJ SOLN: 5.0000 mg | Freq: Three times a day (TID) | INTRAMUSCULAR | Status: DC | PRN

## 2023-11-22 MED ORDER — TRAZODONE HCL 100 MG PO TABS
100.0000 mg | ORAL_TABLET | Freq: Every day | ORAL | Status: DC
  Administered 2023-11-22 – 2023-11-26 (×5): 100 mg via ORAL
  Filled 2023-11-22 (×5): qty 1

## 2023-11-22 MED ORDER — MAGNESIUM HYDROXIDE 400 MG/5ML PO SUSP
30.0000 mL | Freq: Every day | ORAL | Status: DC | PRN
  Administered 2023-11-24: 30 mL via ORAL
  Filled 2023-11-22: qty 30

## 2023-11-22 MED ORDER — PANCRELIPASE (LIP-PROT-AMYL) 36000-114000 UNITS PO CPEP
36000.0000 [IU] | ORAL_CAPSULE | Freq: Once | ORAL | Status: AC
  Administered 2023-11-22: 36000 [IU] via ORAL
  Filled 2023-11-22: qty 1

## 2023-11-22 MED ORDER — ACETAMINOPHEN 500 MG PO TABS
1000.0000 mg | ORAL_TABLET | Freq: Once | ORAL | Status: AC
  Administered 2023-11-22: 1000 mg via ORAL
  Filled 2023-11-22: qty 2

## 2023-11-22 NOTE — Plan of Care (Signed)
  Problem: Coping: Goal: Coping ability will improve Outcome: Progressing Goal: Will verbalize feelings Outcome: Progressing   Problem: Education: Goal: Utilization of techniques to improve thought processes will improve Outcome: Not Progressing Goal: Knowledge of the prescribed therapeutic regimen will improve Outcome: Not Progressing

## 2023-11-22 NOTE — Progress Notes (Signed)
 Admission Note:  Brent [name] was admitted voluntarily from [facility] emergency department.  Patient was discharged, but returned to the ER an hour later reporting suicidal ideation.  Patient has multiple medical issues:  G tube (LUQ),  HTN, COPD, kidney disease, degenerative disc disease and type 2 diabetes.    Patient cooperative with admission process.  Currently denies suicidal ideation.  Also denies HI and AVH.  Endorses anxiety/ panic attacks and agoraphobia.  Pain is rated 8/10 in LUQ - chronic.  Patient is preoccupied with medical issues.  Reports he is having surgery on his eye in a few days.    Per patient chart and report from ER, patient was not using G tube and eating by mouth. Upon admission to the unit patient states he can only tolerate bites of soft foods by mouth, but will need tube feedings.   Patient will be a high fall risk due to weakness/ recent weight loss.  No history of falls.   One time dose of Creon  was given per patient request.  1700 dose was not given prior to arrival.  Patient oriented to unit.  Questions and concerns addressed.  Patient has showered.   Declined dinner tray.

## 2023-11-22 NOTE — ED Notes (Signed)
 We can accept pt to [facility]. Attending MD Allyn Sequin. RN TO RN report to [phone]. Pt must sign a voluntary consent and fax that to [phone] and send original with pt at transfer. DX MDD, Adjustment D.O.

## 2023-11-22 NOTE — ED Notes (Signed)
 Pt requesting pain medicine for abd pain due to G-Tube. MD made aware, no new orders at this time.

## 2023-11-22 NOTE — ED Provider Notes (Addendum)
 Emergency Medicine Observation Re-evaluation Note  AMARU BURROUGHS is a 53 y.o. male, seen on rounds today.  Pt initially presented to the ED for complaints of Psychiatric Evaluation Currently, the patient is resting.  No concerns from nursing staff.  Patient has been accepted to [facility] [place].  Physical Exam  BP 125/89 (BP Location: Left Arm)   Pulse (!) 102   Temp 98.5 F (36.9 C) (Oral)   Resp 16   Ht 5' 7 (1.702 m)   Wt 46.7 kg   SpO2 97%   BMI 16.13 kg/m  Physical Exam General: No acute distress  ED Course / MDM  EKG:   I have reviewed the labs performed to date as well as medications administered while in observation.  Recent changes in the last 24 hours include -no new changes.  Plan  Current plan is for holding patient for psychiatric stabilization and eventually transferring to [facility] [place].    Charlyn Sora, MD 11/22/23 1340  1:42 PM Patient actually being transferred to [facility].   Charlyn Sora, MD 11/22/23 1342
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
## 2023-11-22 NOTE — ED Notes (Signed)
 States, feel better after xanax  and prozac

## 2023-11-23 DIAGNOSIS — F332 Major depressive disorder, recurrent severe without psychotic features: Principal | ICD-10-CM

## 2023-11-23 MED ORDER — NAPROXEN 250 MG PO TABS
250.0000 mg | ORAL_TABLET | Freq: Two times a day (BID) | ORAL | Status: DC
  Administered 2023-11-23 – 2023-11-27 (×8): 250 mg via ORAL
  Filled 2023-11-23 (×10): qty 1

## 2023-11-23 MED ORDER — FLUOXETINE HCL 20 MG/5ML PO SOLN
10.0000 mg | Freq: Every day | ORAL | Status: DC
  Administered 2023-11-24 – 2023-11-27 (×4): 10 mg via ORAL
  Filled 2023-11-23 (×4): qty 2.5

## 2023-11-23 MED ORDER — SIMETHICONE 80 MG PO CHEW
80.0000 mg | CHEWABLE_TABLET | Freq: Four times a day (QID) | ORAL | Status: DC | PRN
  Administered 2023-11-23 – 2023-11-27 (×8): 80 mg via ORAL
  Filled 2023-11-23 (×11): qty 1

## 2023-11-23 NOTE — Group Note (Signed)
 Date:  11/23/2023 Time:  10:57 PM  Group Topic/Focus:  Wrap-Up Group:   The focus of this group is to help patients review their daily goal of treatment and discuss progress on daily workbooks.    Participation Level:  Active  Participation Quality:  Appropriate  Affect:  Appropriate  Cognitive:  Appropriate  Insight: Appropriate  Engagement in Group:  Engaged  Modes of Intervention:  Discussion  Additional Comments:    Brent [name] 11/23/2023, 10:57 PM

## 2023-11-23 NOTE — Group Note (Signed)
 Date:  11/23/2023 Time:  10:58 AM  Group Topic/Focus:  Goals/Love Languages Group:   The focus of this group is to help patients establish daily goals to achieve during treatment and discuss how the patient can incorporate goal setting into their daily lives to aide in recovery. Also patients learned about the different loves languages and identified which love language traits were theirs,     Participation Level:  Did Not Attend  Beatris ONEIDA Hasten 11/23/2023, 10:58 AM

## 2023-11-23 NOTE — BH Assessment (Signed)
(  Sleep Hours) - 6.50 (Any PRNs that were needed, meds refused, or side effects to meds)- 2115 xanax  for anxiety; 0316 Tylenol  c/o headache from dry eyes (Any disturbances and when (visitation, over night)- I usually use drops for eyes and they're feeling really dry causing me to have a headache. (Concerns raised by the patient)- At home I give myself ensure 2.0 5x's/Day; as well as Prozac 3x's/day I thought (SI/HI/AVH)- Denies
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
## 2023-11-23 NOTE — Plan of Care (Signed)
  Problem: Health Behavior/Discharge Planning: Goal: Compliance with therapeutic regimen will improve Outcome: Progressing   Problem: Nutrition Goal: Patient maintains adequate hydration Outcome: Progressing

## 2023-11-23 NOTE — Progress Notes (Signed)
 Mood/Behavior:  Pleasant and cooperative.  Soft spoken.     Psych assessment:  Endorses anxiety.  Denies SI/HI and AVH.    Interaction / Group attendance:  Present in the milieu for meals.  Appropriate interaction with staff.  Minimal interaction with peers.    Medication/ PRNs: Compliant.  PRN pain medication given as ordered.    Pain: 8/10 LUQ  15 min checks in place for safety.    Naproxen and Mylicon ordered this evening.

## 2023-11-23 NOTE — Plan of Care (Signed)
 Adm today, pt c/o inability to eat d/t poor appetite and use of G-tube at home performed by self.   Problem: Education: Goal: Utilization of techniques to improve thought processes will improve Outcome: Not Progressing Goal: Knowledge of the prescribed therapeutic regimen will improve Outcome: Not Progressing   Problem: Coping: Goal: Coping ability will improve Outcome: Not Progressing Goal: Will verbalize feelings Outcome: Not Progressing   Problem: Activity: Goal: Interest or engagement in leisure activities will improve Outcome: Not Progressing Goal: Imbalance in normal sleep/wake cycle will improve Outcome: Not Progressing   Problem: Health Behavior/Discharge Planning: Goal: Ability to make decisions will improve Outcome: Not Progressing Goal: Compliance with therapeutic regimen will improve Outcome: Not Progressing   Problem: Safety: Goal: Ability to disclose and discuss suicidal ideas will improve Outcome: Not Progressing Goal: Ability to identify and utilize support systems that promote safety will improve Outcome: Not Progressing   Problem: Self-Concept: Goal: Will verbalize positive feelings about self Outcome: Not Progressing Goal: Level of anxiety will decrease Outcome: Not Progressing   Problem: Nutrition Goal: Patient maintains adequate hydration Outcome: Not Progressing

## 2023-11-23 NOTE — BHH Suicide Risk Assessment (Signed)
 [facility] Admission Suicide Risk Assessment   Nursing information obtained from:  Patient Demographic factors:  Male Current Mental Status:  NA Loss Factors:  Decline in physical health, Financial problems / change in socioeconomic status Historical Factors:  NA Risk Reduction Factors:  Responsible for children under 55 years of age  Total Time spent with patient: 30 minutes Principal Problem: MDD (major depressive disorder), recurrent severe, without psychosis (HCC) Diagnosis:  Principal Problem:   MDD (major depressive disorder), recurrent severe, without psychosis (HCC)  Subjective Data: 53 year old male with multiple medical problems and psychiatric problems presents the ER today secondary to depression and suicidal thoughts. Patient was seen earlier for abdominal pain and does not appear that he brought up his suicidality. He was cleared medically at that time.  He states that he did not tell the previous provider that he was not want to be on this earth and wanted to die and if he had a gun would kill himself. Patient is admitted to [facility] unit with Q15 min safety monitoring. Multidisciplinary team approach is offered. Medication management; group/milieu therapy is offered.   Continued Clinical Symptoms:  Alcohol  Use Disorder Identification Test Final Score (AUDIT): 0 The Alcohol  Use Disorders Identification Test, Guidelines for Use in Primary Care, Second Edition.  World Science writer [facility]). Score between 0-7:  no or low risk or alcohol  related problems. Score between 8-15:  moderate risk of alcohol  related problems. Score between 16-19:  high risk of alcohol  related problems. Score 20 or above:  warrants further diagnostic evaluation for alcohol  dependence and treatment.   CLINICAL FACTORS:   Depression:   Impulsivity   Musculoskeletal: Strength & Muscle Tone: within normal limits Gait & Station: normal Patient leans: N/A  Psychiatric Specialty Exam:  Presentation   General Appearance:  Appropriate for Environment  Eye Contact: Fair  Speech: Normal Rate  Speech Volume: Normal  Handedness: Right   Mood and Affect  Mood: Depressed; Dysphoric  Affect: Depressed; Flat   Thought Process  Thought Processes: Coherent  Descriptions of Associations:Intact  Orientation:Full (Time, Place and Person)  Thought Content:Logical  History of Schizophrenia/Schizoaffective disorder:No data recorded Duration of Psychotic Symptoms:No data recorded Hallucinations:Hallucinations: None  Ideas of Reference:None  Suicidal Thoughts:Suicidal Thoughts: Yes, Passive  Homicidal Thoughts:Homicidal Thoughts: No   Sensorium  Memory: Immediate Fair; Recent Fair; Remote Fair  Judgment: Impaired  Insight: Shallow   Executive Functions  Concentration: Fair  Attention Span: Fair  Recall: [employer] of Knowledge: Fair  Language: Fair   Psychomotor Activity  Psychomotor Activity: Psychomotor Activity: Normal   Assets  Assets: Communication Skills; Desire for Improvement; Resilience   Sleep  Sleep: Sleep: Fair    Physical Exam: Physical Exam Vitals and nursing note reviewed.    ROS Blood pressure 94/74, pulse (!) 129, temperature 97.9 F (36.6 C), resp. rate 14, height 5' 7 (1.702 m), weight 43.8 kg, SpO2 97%. Body mass index is 15.11 kg/m.   COGNITIVE FEATURES THAT CONTRIBUTE TO RISK:  None    SUICIDE RISK:   Minimal: No identifiable suicidal ideation.  Patients presenting with no risk factors but with morbid ruminations; may be classified as minimal risk based on the severity of the depressive symptoms  PLAN OF CARE: Patient is admitted to [facility] psych unit with Q15 min safety monitoring. Multidisciplinary team approach is offered. Medication management; group/milieu therapy is offered.   I certify that inpatient services furnished can reasonably be expected to improve the patient's condition.   Allyn Foil, MD 11/23/2023,  10:15 PM

## 2023-11-23 NOTE — Group Note (Signed)
 LCSW Group Therapy Note  Group Date: 11/23/2023 Start Time: 1430 End Time: 1530   Type of Therapy and Topic:  Group Therapy - Healthy vs Unhealthy Coping Skills  Participation Level:  Did Not Attend   Description of Group The focus of this group was to determine what unhealthy coping techniques typically are used by group members and what healthy coping techniques would be helpful in coping with various problems. Patients were guided in becoming aware of the differences between healthy and unhealthy coping techniques. Patients were asked to identify 2-3 healthy coping skills they would like to learn to use more effectively.  Therapeutic Goals Patients learned that coping is what human beings do all day long to deal with various situations in their lives Patients defined and discussed healthy vs unhealthy coping techniques Patients identified their preferred coping techniques and identified whether these were healthy or unhealthy Patients determined 2-3 healthy coping skills they would like to become more familiar with and use more often. Patients provided support and ideas to each other   Summary of Patient Progress: The patient did not attend group.  Therapeutic Modalities Cognitive Behavioral Therapy   Brent [name] S Javeah Loeza, LCSWA 11/23/2023  3:37 PM

## 2023-11-23 NOTE — Group Note (Signed)
 Date:  11/23/2023 Time:  6:58 PM  Group Topic/Focus:  Rediscovering Joy:   The focus of this group is to explore various ways to relieve stress in a positive manner.    Participation Level:  Did Not Attend   Harlene LITTIE Gavel 11/23/2023, 6:58 PM
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
## 2023-11-23 NOTE — Progress Notes (Signed)
 Pt calm and pleasant during assessment denying SI/HI/AVH. Pt endorses anxiety. Pt observed by this Clinical research associate interacting appropriately with staff and peers on the unit. Pt compliant with medication administration per MD orders. Pt given education, support, and encouragement to be active in his treatment plan. Pt being monitored Q 15 minutes for safety per unit protocol, remains safe on the unit

## 2023-11-24 NOTE — Group Note (Signed)
 Recreation Therapy Group Note   Group Topic:Coping Skills  Group Date: 11/24/2023 Start Time: 1100 End Time: 1130 Facilitators: Celestia Jeoffrey BRAVO, LRT, CTRS Location: Dayroom  Group Description: Seated Exercise. LRT discussed the mental and physical benefits of exercise. LRT and group discussed how physical activity can be used as a coping skill. Pt's and LRT followed along to an exercise video on the TV screen that provided a visual representation and audio description of every exercise performed. Pt's encouraged to listen to their bodies and stop at any time if they experience feelings of discomfort or pain. Pts were encouraged to drink water  and stay hydrated.   Goal Area(s) Addressed:  Patient will learn benefits of physical activity. Patient will identify exercise as a coping skill.  Patient will follow multistep directions. Patient will try a new leisure interest.    Affect/Mood: N/A   Participation Level: Did not attend    Clinical Observations/Individualized Feedback: Patient did not attend group.   Plan: Continue to engage patient in RT group sessions 2-3x/week.   Jeoffrey BRAVO Celestia, LRT, CTRS 11/24/2023 1:52 PM

## 2023-11-24 NOTE — Plan of Care (Signed)
  Problem: Coping: Goal: Coping ability will improve Outcome: Progressing   Problem: Self-Concept: Goal: Level of anxiety will decrease Outcome: Progressing

## 2023-11-24 NOTE — Group Note (Signed)
 Recreation Therapy Group Note   Group Topic:Leisure Education  Group Date: 11/24/2023 Start Time: 1500 End Time: 1550 Facilitators: Celestia Jeoffrey BRAVO, LRT, CTRS Location: Courtyard  Group Description: Music. Patients are encouraged to name their favorite song(s) for LRT to play song through speaker for group to hear, while in the courtyard getting fresh air and sunlight. Patients educated on the definition of leisure and the importance of having different leisure interests outside of the hospital. Group discussed how leisure activities can often be used as Pharmacologist and that listening to music and being outside are examples.    Goal Area(s) Addressed:  Patient will identify a current leisure interest.  Patient will practice making a positive decision. Patient will have the opportunity to try a new leisure activity.   Affect/Mood: N/A   Participation Level: Did not attend    Clinical Observations/Individualized Feedback: Patient did not attend group.   Plan: Continue to engage patient in RT group sessions 2-3x/week.   Jeoffrey BRAVO Celestia, LRT, CTRS 11/24/2023 4:13 PM

## 2023-11-24 NOTE — [facility] Suicide Risk Assessment (Signed)
 [facility] INPATIENT:  Family/Significant Other Suicide Prevention Education  Suicide Prevention Education:  Education Completed; Brent [name], brother, [phone] ,  (name of family member/significant other) has been identified by the patient as the family member/significant other with whom the patient will be residing, and identified as the person(s) who will aid the patient in the event of a mental health crisis (suicidal ideations/suicide attempt).  With written consent from the patient, the family member/significant other has been provided the following suicide prevention education, prior to the and/or following the discharge of the patient.  The suicide prevention education provided includes the following: Suicide risk factors Suicide prevention and interventions National Suicide Hotline telephone number [facility] assessment telephone number [facility] Emergency Assistance 911 [facility] and/or Residential Mobile Crisis Unit telephone number  Request made of family/significant other to: Remove weapons (e.g., guns, rifles, knives), all items previously/currently identified as safety concern.   Remove drugs/medications (over-the-counter, prescriptions, illicit drugs), all items previously/currently identified as a safety concern.  The family member/significant other verbalizes understanding of the suicide prevention education information provided.  The family member/significant other agrees to remove the items of safety concern listed above.  Reports that pt walked into his room and busted his TV and they started arguing.   Reports he does NOT feel pt is a danger to himself or others, states, I don't think he's a danger, he's too small.    Reports he is comfortable with pt returning back to the home, Most of the time he stays to himself and I stay to myself, it's not like we communicate too much.    Reports he feels this happened because pt's PCP took him off of  the medications that pt's psychiatrist put him (pt) on.  Reports pt does NOT have access to weapons.   Reports he feels pt DOES need medication, He needs something, he always wants to fuss about politics.  Reports pt does not leave his house except to go to the doctor.   Brent [name] 11/24/2023, 11:07 AM

## 2023-11-24 NOTE — Group Note (Signed)
 Date:  11/24/2023 Time:  9:10 PM  Group Topic/Focus:  Wrap-Up Group:   The focus of this group is to help patients review their daily goal of treatment and discuss progress on daily workbooks.    Participation Level:  Active  Participation Quality:  Appropriate  Affect:  Appropriate  Cognitive:  Alert  Insight: Appropriate  Engagement in Group:  Engaged  Modes of Intervention:  Discussion  Additional Comments:    Sherrilyn JAYSON Redman 11/24/2023, 9:10 PM

## 2023-11-24 NOTE — Progress Notes (Signed)
   11/22/23 2115  Psych Admission Type (Psych Patients Only)  Admission Status Voluntary  Psychosocial Assessment  Patient Complaints Other (Comment) (c/o sharp pain in LUQ)  Eye Contact Fair  Facial Expression Flat  Affect Flat  Speech Logical/coherent  Interaction Assertive  Motor Activity Slow  Appearance/Hygiene Unremarkable  Behavior Characteristics Cooperative  Mood Preoccupied;Pleasant  Aggressive Behavior  Effect No apparent injury  Thought Process  Coherency Circumstantial  Content Preoccupation  Delusions None reported or observed  Perception WDL  Hallucination None reported or observed  Judgment Impaired  Confusion Mild  Danger to Self  Current suicidal ideation? Denies  Danger to Others  Danger to Others None reported or observed

## 2023-11-24 NOTE — Progress Notes (Signed)
   11/24/23 0735  Psych Admission Type (Psych Patients Only)  Admission Status Voluntary  Psychosocial Assessment  Patient Complaints Sadness  Eye Contact Fair  Facial Expression Flat  Affect Sullen  Speech Soft  Interaction Needy  Motor Activity Slow  Appearance/Hygiene Unremarkable  Behavior Characteristics Cooperative  Mood Pleasant  Thought Process  Coherency WDL  Content WDL  Delusions None reported or observed  Perception WDL  Hallucination None reported or observed  Judgment Impaired  Confusion Mild  Danger to Self  Current suicidal ideation? Denies  Danger to Others  Danger to Others None reported or observed

## 2023-11-24 NOTE — Group Note (Signed)
 Physical/Occupational Therapy Group Note  Group Topic: Yoga  Group Date: 11/24/2023 Start Time: 1300 End Time: 1330 Facilitators: Jonni Oelkers, Alm Hamilton, PT   Group Description: Group participated with series of yoga poses, designed to emphasize functional sitting balance, core stability, generalized flexibility and overall posture.  Incorporated deep breathing techniques with poses, working to promote relaxation, mindfulness and focus with targeted activities.   Discussed benefits of yoga in improving mood and self-esteem, reducing stress and anxiety, and promoting functional strength and balance for each participant.  Discussed ways to integrate into each participant's daily routine.  Provided handout with written and pictorial descriptions of included yoga movements to be utilized as appropriate outside of group time.  Therapeutic Goal(s):  Demonstrate safe ability to participate with yoga poses during group activity. Identify one benefit of participation with yoga poses as part of each participant's exercise/movement routine. Identify 1-2 individual poses that participant feels most beneficial to his/her needs and that he/she can easily replicate outside of group.  Individual Participation: Did not attend  Participation Level:   Participation Quality:   Behavior:   Speech/Thought Process:   Affect/Mood:   Insight:   Judgement:   Modes of Intervention:   Patient Response to Interventions:    Plan: Continue to engage patient in PT/OT groups 1 - 2x/week.  CHARM Hamilton Bertin PT, DPT 11/24/23, 2:12 PM

## 2023-11-24 NOTE — Progress Notes (Signed)
 [facility] MD Progress Note  11/24/2023 9:06 PM Brent [name]  MRN:  [id number]  53 year old male with multiple medical problems and psychiatric problems presents the ER today secondary to depression and suicidal thoughts. Patient was seen earlier for abdominal pain and does not appear that he brought up his suicidality. He was cleared medically at that time.  He states that he did not tell the previous provider that he was not want to be on this earth and wanted to die and if he had a gun would kill himself. Patient is admitted to [facility] unit with Q15 min safety monitoring. Multidisciplinary team approach is offered. Medication management; group/milieu therapy is offered.  Subjective:  Chart reviewed, case discussed in multidisciplinary meeting, patient seen during rounds.  On interview patient is noted to be doing well he met with the treatment team today.  He reports that his goals of treatment being feeling better with his depression and is able to do his daily activities with no hesitation or anxiety.  He reports that since he was started on fluoxetine his mood improved drastically.  He denies any active suicidal/homicidal ideation/plan.  He denies any auditory/visual hallucinations.  He reports with simethicone  his stomach upset has improved significantly.  Reached out to dietitian and they are very reportedly will come tomorrow to check on the patient's GJ tube feeds   Sleep: Fair  Appetite:  Fair  Past Psychiatric History: see h&P Family History:  Family History  Problem Relation Age of Onset   Hypertension Mother    Breast cancer Mother    Melanoma Mother    Stroke Mother    Lung cancer Father 76   Colon cancer Neg Hx    Colitis Neg Hx    Cirrhosis Neg Hx    Liver disease Neg Hx    Pancreatic cancer Neg Hx    Pancreatitis Neg Hx    Anesthesia problems Neg Hx    Hypotension Neg Hx    Malignant hyperthermia Neg Hx    Pseudochol deficiency Neg Hx    Social History:  Social  History   Substance and Sexual Activity  Alcohol  Use Not Currently     Social History   Substance and Sexual Activity  Drug Use No    Social History   Socioeconomic History   Marital status: Single    Spouse name: Not on file   Number of children: 0   Years of education: Not on file   Highest education level: Not on file  Occupational History   Occupation: disabled    Comment: anxiety  Tobacco Use   Smoking status: Every Day    Current packs/day: 1.00    Average packs/day: 1 pack/day for 21.7 years (21.7 ttl pk-yrs)    Types: Cigarettes, Cigars    Start date: 03/2002   Smokeless tobacco: Never   Tobacco comments:    1 pack per week now as of 02/24/2018   Vaping Use   Vaping status: Never Used  Substance and Sexual Activity   Alcohol  use: Not Currently   Drug use: No   Sexual activity: Not on file  Other Topics Concern   Not on file  Social History Narrative   Right handed   Caffeine use: mostly water /OJ, tea sometimes.    Social Drivers of Corporate investment banker Strain: Low Risk  (02/24/2018)   Overall Financial Resource Strain (CARDIA)    Difficulty of Paying Living Expenses: Not very hard  Food Insecurity: Food Insecurity Present (  11/22/2023)   Hunger Vital Sign    Worried About Programme researcher, broadcasting/film/video in the Last Year: Often true    Ran Out of Food in the Last Year: Often true  Transportation Needs: Unmet Transportation Needs (11/22/2023)   PRAPARE - Administrator, Civil Service (Medical): Yes    Lack of Transportation (Non-Medical): Yes  Physical Activity: Not on file  Stress: Not on file  Social Connections: Not on file   Past Medical History:  Past Medical History:  Diagnosis Date   Alcohol  use    Anxiety    Disabled due to panic attacks   Arthritis    Bilateral ureteral calculi    Borderline type 2 diabetes mellitus    BPH (benign prostatic hypertrophy)    Chronic back pain    Condylomata acuminata in male    multiple procedures  --  penile, peri-rectal , perineum   DDD (degenerative disc disease), cervical    Depression    Dyspnea on exertion    Emphysematous COPD (HCC)    Epiretinal membrane (ERM) of both eyes    Fibromyalgia    GERD (gastroesophageal reflux disease)    Headache(784.0)    History of chronic pancreatitis    severeal adx for this /  2008  dx alcoholic pancreatitis   History of esophageal dilatation    History of hiatal hernia    History of kidney stones    History of panic attacks    History of sepsis    adx 05-01-2014--  urosepsis due to kidney stones obstruction/ hydronephrosis   History of suicidal ideation    2006   adx   Hypertension    was on medication for short time, htn was caused by prednisone  per pt   Multiple sclerosis    pt. states has 4 small brain lesions   Nephrolithiasis    bilateral    Retinal vasculitis    Uveitis     Past Surgical History:  Procedure Laterality Date   BIOPSY  11/12/2012   Procedure: GASTRIC AND ESOPHAGEAL BIOPSIES;  Surgeon: Lamar CHRISTELLA Hollingshead, MD;  Location: [facility];  Service: Endoscopy;;   CATARACT EXTRACTION W/ INTRAOCULAR LENS  IMPLANT, BILATERAL     COLONOSCOPY  10/08/2011   Jenkins:Normal colon/Anal condyloma without extension proximal to dentate line   CYSTOSCOPY W/ URETERAL STENT PLACEMENT Bilateral 05/01/2014   Procedure: CYSTOSCOPY WITH BILATERAL RETROGRADE PYELOGRAM; BILATERAL URETERAL STENT PLACEMENT;  Surgeon: Alm Fragmin, MD;  Location: [facility];  Service: Urology;  Laterality: Bilateral;   CYSTOSCOPY W/ URETERAL STENT REMOVAL Bilateral 06/06/2014   Procedure: CYSTOSCOPY WITH STENT REMOVAL;  Surgeon: Garnette Shack, MD;  Location[facility];  Service: Urology;  Laterality: Bilateral;   CYSTOSCOPY WITH URETEROSCOPY  06/06/2014   Procedure: CYSTOSCOPY WITH URETEROSCOPY;  Surgeon: Garnette Shack, MD;  Location: [facility];  Service: Urology;;   CYSTOSCOPY WITH URETEROSCOPY AND STENT PLACEMENT Bilateral  06/06/2014   Procedure: CYSTOSCOPY WITH J2 STENT EXTRACTION,,URETEROSCOPY WITH EXTRACTION OF STONES,;  Surgeon: Garnette Shack, MD;  Location: [facility];  Service: Urology;  Laterality: Bilateral;   ELECTROCAUTERY/ DESICCATION OF CONDYLOMA LESIONS  01-15-2008  &  12-29-2009   PENIS, PERI-RECTAL , PERINUEM   ESOPHAGOGASTRODUODENOSCOPY (EGD) WITH PROPOFOL  N/A 11/12/2012   MFM:Wnmfjo esophagus s/p  passage of a Maloney dilator and biopsy. Abnormal gastric mucosa-status post biopsy   FLEXIBLE BRONCHOSCOPY N/A 08/12/2012   Procedure: FLEXIBLE BRONCHOSCOPY;  Surgeon: Dallas LITTIE Gelineau, MD;  Location: [facility];  Service:  Pulmonary;  Laterality: N/A;   MALONEY DILATION N/A 11/12/2012   Procedure: MALONEY DILATION (54mm);  Surgeon: Lamar CHRISTELLA Hollingshead, MD;  Location: [facility];  Service: Endoscopy;  Laterality: N/A;   MULTIPLE EXTRACTIONS WITH ALVEOLOPLASTY N/A 04/22/2014   Procedure: MULTIPLE EXTRACTIONS ( 2,3,5,6,7,8,9,10,11,13,14,15,21,28  WITH ALVEOLOPLASTY;  Surgeon: Glendia Primrose, DDS;  Location: [facility];  Service: Oral Surgery;  Laterality: N/A;   WISDOM TOOTH EXTRACTION      Current Medications: Current Facility-Administered Medications  Medication Dose Route Frequency Provider Last Rate Last Admin   acetaminophen  (TYLENOL ) tablet 650 mg  650 mg Oral Q6H PRN Mannie Jerel PARAS, NP   650 mg at 11/24/23 0209   ALPRAZolam  (XANAX ) tablet 1 mg  1 mg Oral TID PRN Mannie Jerel PARAS, NP   1 mg at 11/24/23 1632   alum & mag hydroxide-simeth (MAALOX/MYLANTA) 200-200-20 MG/5ML suspension 30 mL  30 mL Oral Q4H PRN Mannie Jerel PARAS, NP       cholecalciferol  (VITAMIN D3) 25 MCG (1000 UNIT) tablet 1,000 Units  1,000 Units Oral Daily Mannie Jerel PARAS, NP   1,000 Units at 11/24/23 1021   cyanocobalamin (VITAMIN B12) tablet 2,000 mcg  2,000 mcg Oral Daily Mannie Jerel PARAS, NP   2,000 mcg at 11/24/23 1021   haloperidol (HALDOL) tablet 5 mg  5 mg Oral TID PRN Mannie Jerel PARAS, NP       And   diphenhydrAMINE  (BENADRYL) capsule 50 mg  50 mg Oral TID PRN Mannie Jerel PARAS, NP       haloperidol lactate (HALDOL) injection 5 mg  5 mg Intramuscular TID PRN Mannie Jerel PARAS, NP       And   diphenhydrAMINE (BENADRYL) injection 50 mg  50 mg Intramuscular TID PRN Mannie Jerel PARAS, NP       And   LORazepam  (ATIVAN ) injection 2 mg  2 mg Intramuscular TID PRN Mannie Jerel PARAS, NP       haloperidol lactate (HALDOL) injection 10 mg  10 mg Intramuscular TID PRN Mannie Jerel PARAS, NP       And   diphenhydrAMINE (BENADRYL) injection 50 mg  50 mg Intramuscular TID PRN Mannie Jerel PARAS, NP       And   LORazepam  (ATIVAN ) injection 2 mg  2 mg Intramuscular TID PRN Mannie Jerel PARAS, NP       docusate sodium (COLACE) capsule 100 mg  100 mg Oral Daily Mannie Jerel PARAS, NP   100 mg at 11/24/23 1021   feeding supplement (ENSURE PLUS HIGH PROTEIN) liquid 237 mL  237 mL Oral TID BM Brent Hogans, MD   237 mL at 11/24/23 2028   FLUoxetine (PROZAC) 20 MG/5ML solution 10 mg  10 mg Oral Daily Elizabet Schweppe, MD   10 mg at 11/24/23 1021   lipase/protease/amylase (CREON ) capsule 36,000 Units  36,000 Units Oral TID WC Mannie Jerel PARAS, NP   36,000 Units at 11/24/23 1632   magnesium  hydroxide (MILK OF MAGNESIA) suspension 30 mL  30 mL Oral Daily PRN Mannie Jerel PARAS, NP       naproxen (NAPROSYN) tablet 250 mg  250 mg Oral BID WC Dierks Wach, MD   250 mg at 11/24/23 1632   nicotine  (NICODERM CQ  - dosed in mg/24 hours) patch 14 mg  14 mg Transdermal Daily Mannie Jerel PARAS, NP   14 mg at 11/24/23 1032   simethicone  (MYLICON) chewable tablet 80 mg  80 mg Oral Q6H PRN Ikhlas Albo, MD   80 mg at  11/24/23 1632   traZODone (DESYREL) tablet 100 mg  100 mg Oral QHS Mannie Jerel PARAS, NP   100 mg at 11/23/23 2120    Lab Results: No results found for this or any previous visit (from the past 48 hours).  Blood Alcohol  level:  Lab Results  Component Value Date   ETH <10 06/08/2019   ETH <10 11/27/2017    Metabolic Disorder  Labs: Lab Results  Component Value Date   HGBA1C 5.2 06/09/2019   MPG 102.54 06/09/2019   No results found for: PROLACTIN Lab Results  Component Value Date   CHOL 93 11/27/2017   TRIG 94 02/25/2018   HDL 29 (L) 11/27/2017   CHOLHDL 3.2 11/27/2017   VLDL 28 11/27/2017   LDLCALC 36 11/27/2017    Physical Findings: AIMS:  , ,  ,  ,    CIWA:    COWS:      Psychiatric Specialty Exam:  Presentation  General Appearance:  Appropriate for Environment  Eye Contact: Fair  Speech: Normal Rate  Speech Volume: Normal    Mood and Affect  Mood: Depressed; Dysphoric  Affect: Depressed; Flat   Thought Process  Thought Processes: Coherent  Descriptions of Associations:Intact  Orientation:Full (Time, Place and Person)  Thought Content:Logical  Hallucinations:Hallucinations: None  Ideas of Reference:None  Suicidal Thoughts:Suicidal Thoughts: Yes, Passive  Homicidal Thoughts:Homicidal Thoughts: No   Sensorium  Memory: Immediate Fair; Recent Fair; Remote Fair  Judgment: Impaired  Insight: Shallow   Executive Functions  Concentration: Fair  Attention Span: Fair  Recall: Fair  Fund of Knowledge: Fair  Language: Fair   Psychomotor Activity  Psychomotor Activity: Psychomotor Activity: Normal  Musculoskeletal: Strength & Muscle Tone: within normal limits Gait & Station: normal Assets  Assets: Manufacturing systems engineer; Desire for Improvement; Resilience    Physical Exam: Physical Exam ROS Blood pressure 95/67, pulse 97, temperature 98.9 F (37.2 C), resp. rate 14, height 5' 7 (1.702 m), weight 43.8 kg, SpO2 99%. Body mass index is 15.11 kg/m.  Diagnosis: Principal Problem:   MDD (major depressive disorder), recurrent severe, without psychosis (HCC)   PLAN: Safety and Monitoring:  -- Voluntary admission to inpatient psychiatric unit for safety, stabilization and treatment  -- Daily contact with patient to assess and evaluate  symptoms and progress in treatment  -- Patient's case to be discussed in multi-disciplinary team meeting  -- Observation Level : q15 minute checks  -- Vital signs:  q12 hours  -- Precautions: suicide, elopement, and assault -- Encouraged patient to participate in unit milieu and in scheduled group therapies  2. Psychiatric Diagnoses and Treatment:    Fluoxetine syrup 10 mg daily Trazodone 100 mg qhs    3. Medical Issues Being Addressed:   GJ tube Dietician consult placed regarding tube feeding as per patient request  4. Discharge Planning:   -- Social work and case management to assist with discharge planning and identification of hospital follow-up needs prior to discharge  -- Estimated LOS: 3-4 days  Allyn Foil, MD 11/24/2023, 9:06 PM

## 2023-11-24 NOTE — BH IP Treatment Plan (Signed)
 Interdisciplinary Treatment and Diagnostic Plan Update  11/24/2023 Time of Session: 11:12 AM Brent [name] MRN: [id number]  Principal Diagnosis: MDD (major depressive disorder), recurrent severe, without psychosis (HCC)  Secondary Diagnoses: Principal Problem:   MDD (major depressive disorder), recurrent severe, without psychosis (HCC)   Current Medications:  Current Facility-Administered Medications  Medication Dose Route Frequency Provider Last Rate Last Admin   acetaminophen  (TYLENOL ) tablet 650 mg  650 mg Oral Q6H PRN Mannie Jerel PARAS, NP   650 mg at 11/24/23 0209   ALPRAZolam  (XANAX ) tablet 1 mg  1 mg Oral TID PRN Mannie Jerel PARAS, NP   1 mg at 11/24/23 1027   alum & mag hydroxide-simeth (MAALOX/MYLANTA) 200-200-20 MG/5ML suspension 30 mL  30 mL Oral Q4H PRN Mannie Jerel PARAS, NP       cholecalciferol  (VITAMIN D3) 25 MCG (1000 UNIT) tablet 1,000 Units  1,000 Units Oral Daily Mannie Jerel PARAS, NP   1,000 Units at 11/24/23 1021   cyanocobalamin (VITAMIN B12) tablet 2,000 mcg  2,000 mcg Oral Daily Mannie Jerel PARAS, NP   2,000 mcg at 11/24/23 1021   haloperidol (HALDOL) tablet 5 mg  5 mg Oral TID PRN Mannie Jerel PARAS, NP       And   diphenhydrAMINE (BENADRYL) capsule 50 mg  50 mg Oral TID PRN Mannie Jerel PARAS, NP       haloperidol lactate (HALDOL) injection 5 mg  5 mg Intramuscular TID PRN Mannie Jerel PARAS, NP       And   diphenhydrAMINE (BENADRYL) injection 50 mg  50 mg Intramuscular TID PRN Mannie Jerel PARAS, NP       And   LORazepam  (ATIVAN ) injection 2 mg  2 mg Intramuscular TID PRN Mannie Jerel PARAS, NP       haloperidol lactate (HALDOL) injection 10 mg  10 mg Intramuscular TID PRN Mannie Jerel PARAS, NP       And   diphenhydrAMINE (BENADRYL) injection 50 mg  50 mg Intramuscular TID PRN Mannie Jerel PARAS, NP       And   LORazepam  (ATIVAN ) injection 2 mg  2 mg Intramuscular TID PRN Mannie Jerel PARAS, NP       docusate sodium (COLACE) capsule 100 mg  100 mg Oral Daily Mannie Jerel PARAS,  NP   100 mg at 11/24/23 1021   feeding supplement (ENSURE PLUS HIGH PROTEIN) liquid 237 mL  237 mL Oral TID BM Jadapalle, Sree, MD       FLUoxetine (PROZAC) 20 MG/5ML solution 10 mg  10 mg Oral Daily Jadapalle, Sree, MD   10 mg at 11/24/23 1021   lipase/protease/amylase (CREON ) capsule 36,000 Units  36,000 Units Oral TID WC Mannie Jerel PARAS, NP   36,000 Units at 11/24/23 9271   magnesium  hydroxide (MILK OF MAGNESIA) suspension 30 mL  30 mL Oral Daily PRN Mannie Jerel PARAS, NP       naproxen (NAPROSYN) tablet 250 mg  250 mg Oral BID WC Jadapalle, Sree, MD   250 mg at 11/24/23 1021   nicotine  (NICODERM CQ  - dosed in mg/24 hours) patch 14 mg  14 mg Transdermal Daily Mannie Jerel PARAS, NP   14 mg at 11/24/23 1032   simethicone  (MYLICON) chewable tablet 80 mg  80 mg Oral Q6H PRN Jadapalle, Sree, MD   80 mg at 11/24/23 1021   traZODone (DESYREL) tablet 100 mg  100 mg Oral QHS Mannie Jerel PARAS, NP   100 mg at 11/23/23 2120   PTA Medications: Medications  Prior to Admission  Medication Sig Dispense Refill Last Dose/Taking   ALPRAZolam  (XANAX ) 1 MG tablet Take 1 tablet (1 mg total) by mouth 2 (two) times daily as needed for anxiety. (Patient taking differently: Take 1 mg by mouth 3 (three) times daily.) 30 tablet 0    Cholecalciferol  (VITAMIN D  HIGH POTENCY) 25 MCG (1000 UT) capsule Take 1,000 Units by mouth daily.      CREON  24000-76000 units CPEP Take 1-3 capsules by mouth See admin instructions. Take 3 capsules by mouth three times daily with each meal and take 1 capsule by mouth daily with each snack.      cyanocobalamin (VITAMIN B12) 1000 MCG tablet Take 2,000 mcg by mouth daily.      docusate sodium (COLACE) 100 MG capsule Take 100 mg by mouth daily.      ketorolac  (ACULAR ) 0.5 % ophthalmic solution Place 2 drops into both eyes 4 (four) times daily.      naproxen (NAPROSYN) 500 MG tablet Take 1 tablet (500 mg total) by mouth 2 (two) times daily with a meal. 30 tablet 0    omeprazole (PRILOSEC) 40 MG  capsule Take 40 mg by mouth at bedtime.      ondansetron  (ZOFRAN ) 4 MG tablet Take 4 mg by mouth every 8 (eight) hours as needed for nausea.       prednisoLONE  acetate (PRED FORTE ) 1 % ophthalmic suspension Place 2 drops into both eyes 2 (two) times daily.       SYMBICORT 80-4.5 MCG/ACT inhaler Inhale 1 puff into the lungs 2 (two) times daily.      traZODone (DESYREL) 100 MG tablet Take 100 mg by mouth at bedtime.       Patient Stressors:    Patient Strengths:    Treatment Modalities: Medication Management, Group therapy, Case management,  1 to 1 session with clinician, Psychoeducation, Recreational therapy.   Physician Treatment Plan for Primary Diagnosis: MDD (major depressive disorder), recurrent severe, without psychosis (HCC) Long Term Goal(s): Improvement in symptoms so as ready for discharge   Short Term Goals: Ability to identify changes in lifestyle to reduce recurrence of condition will improve Ability to verbalize feelings will improve Ability to disclose and discuss suicidal ideas Ability to demonstrate self-control will improve  Medication Management: Evaluate patient's response, side effects, and tolerance of medication regimen.  Therapeutic Interventions: 1 to 1 sessions, Unit Group sessions and Medication administration.  Evaluation of Outcomes: Not Met  Physician Treatment Plan for Secondary Diagnosis: Principal Problem:   MDD (major depressive disorder), recurrent severe, without psychosis (HCC)  Long Term Goal(s): Improvement in symptoms so as ready for discharge   Short Term Goals: Ability to identify changes in lifestyle to reduce recurrence of condition will improve Ability to verbalize feelings will improve Ability to disclose and discuss suicidal ideas Ability to demonstrate self-control will improve     Medication Management: Evaluate patient's response, side effects, and tolerance of medication regimen.  Therapeutic Interventions: 1 to 1 sessions,  Unit Group sessions and Medication administration.  Evaluation of Outcomes: Not Met   RN Treatment Plan for Primary Diagnosis: MDD (major depressive disorder), recurrent severe, without psychosis (HCC) Long Term Goal(s): Knowledge of disease and therapeutic regimen to maintain health will improve  Short Term Goals: Ability to verbalize frustration and anger appropriately will improve, Ability to demonstrate self-control, Ability to participate in decision making will improve, Ability to verbalize feelings will improve, Ability to disclose and discuss suicidal ideas, and Ability to identify and develop effective  coping behaviors will improve  Medication Management: RN will administer medications as ordered by provider, will assess and evaluate patient's response and provide education to patient for prescribed medication. RN will report any adverse and/or side effects to prescribing provider.  Therapeutic Interventions: 1 on 1 counseling sessions, Psychoeducation, Medication administration, Evaluate responses to treatment, Monitor vital signs and CBGs as ordered, Perform/monitor CIWA, COWS, AIMS and Fall Risk screenings as ordered, Perform wound care treatments as ordered.  Evaluation of Outcomes: Not Met   LCSW Treatment Plan for Primary Diagnosis: MDD (major depressive disorder), recurrent severe, without psychosis (HCC) Long Term Goal(s): Safe transition to appropriate next level of care at discharge, Engage patient in therapeutic group addressing interpersonal concerns.  Short Term Goals: Engage patient in aftercare planning with referrals and resources, Increase social support, Increase ability to appropriately verbalize feelings, Increase emotional regulation, Facilitate acceptance of mental health diagnosis and concerns, Facilitate patient progression through stages of change regarding substance use diagnoses and concerns, and Identify triggers associated with mental health/substance abuse  issues  Therapeutic Interventions: Assess for all discharge needs, 1 to 1 time with Social worker, Explore available resources and support systems, Assess for adequacy in community support network, Educate family and significant other(s) on suicide prevention, Complete Psychosocial Assessment, Interpersonal group therapy.  Evaluation of Outcomes: Not Met   Progress in Treatment: Attending groups: Yes. and No. Participating in groups: Yes. and No. Taking medication as prescribed: Yes. Toleration medication: Yes. Family/Significant other contact made: No, will contact:  CSW to contact once permission is granted.  Patient understands diagnosis: Yes. Discussing patient identified problems/goals with staff: No. Medical problems stabilized or resolved: Yes. Denies suicidal/homicidal ideation: Yes. Issues/concerns per patient self-inventory: No. Other: None  New problem(s) identified: No, Describe:  None  New Short Term/Long Term Goal(s):detox, elimination of symptoms of psychosis, medication management for mood stabilization; elimination of SI thoughts; development of comprehensive mental wellness/sobriety plan.    Patient Goals:  To learn how to deal with my depression and make it through life.  Discharge Plan or Barriers: CSW to assist with the development of appropriate discharge plan.    Reason for Continuation of Hospitalization: Anxiety Depression Suicidal ideation  Estimated Length of Stay: 1-7 days.   Last 3 [place] Suicide Severity Risk Score: Flowsheet Row Admission (Current) from 11/22/2023 in [facility] BEHAVIORAL MEDICINE Most recent reading at 11/22/2023 10:03 PM ED from 11/20/2023 in [facility] Emergency Department at [facility] Most recent reading at 11/20/2023 10:21 PM ED from 11/20/2023 in [facility] Emergency Department at [facility] Most recent reading at 11/20/2023  4:23 PM  C-SSRS RISK CATEGORY Low Risk Low Risk No Risk    Last PHQ 2/9  Scores:     No data to display          Scribe for Treatment Team: Alveta CHRISTELLA Kerns, LCSW 11/24/2023 11:12 AM

## 2023-11-24 NOTE — Group Note (Signed)
 Date:  11/24/2023 Time:  11:22 AM  Group Topic/Focus:  Building Self Esteem:   The Focus of this group is helping patients become aware of the effects of self-esteem on their lives, the things they and others do that enhance or undermine their self-esteem, seeing the relationship between their level of self-esteem and the choices they make and learning ways to enhance self-esteem.    Participation Level:  Did Not Attend   Arland Nutting 11/24/2023, 11:22 AM

## 2023-11-25 MED ORDER — ADULT MULTIVITAMIN W/MINERALS CH
1.0000 | ORAL_TABLET | Freq: Every day | ORAL | Status: DC
  Administered 2023-11-26 – 2023-11-27 (×2): 1 via ORAL
  Filled 2023-11-25 (×2): qty 1

## 2023-11-25 MED ORDER — NAPHAZOLINE-GLYCERIN 0.012-0.25 % OP SOLN
1.0000 [drp] | Freq: Four times a day (QID) | OPHTHALMIC | Status: DC | PRN
  Administered 2023-11-25 – 2023-11-27 (×4): 2 [drp] via OPHTHALMIC
  Filled 2023-11-25: qty 15

## 2023-11-25 MED ORDER — KATE FARMS STANDARD 1.4 PO LIQD
325.0000 mL | Freq: Two times a day (BID) | ORAL | Status: DC
Start: 2023-11-25 — End: 2023-11-27
  Administered 2023-11-25 – 2023-11-27 (×4): 325 mL via ORAL

## 2023-11-25 NOTE — Plan of Care (Signed)
  Problem: Activity: Goal: Interest or engagement in leisure activities will improve Outcome: Progressing Goal: Imbalance in normal sleep/wake cycle will improve Outcome: Progressing   Problem: Coping: Goal: Coping ability will improve Outcome: Progressing Goal: Will verbalize feelings Outcome: Progressing   

## 2023-11-25 NOTE — Progress Notes (Signed)
 [facility] MD Progress Note  11/25/2023 10:28 PM Brent [name]  MRN:  [id number]  53 year old male with multiple medical problems and psychiatric problems presents the ER today secondary to depression and suicidal thoughts. Patient was seen earlier for abdominal pain and does not appear that he brought up his suicidality. He was cleared medically at that time.  He states that he did not tell the previous provider that he was not want to be on this earth and wanted to die and if he had a gun would kill himself. Patient is admitted to [facility] unit with Q15 min safety monitoring. Multidisciplinary team approach is offered. Medication management; group/milieu therapy is offered.  Subjective:  Chart reviewed, case discussed in multidisciplinary meeting, patient seen during rounds.   Sleep: Fair  Appetite:  Fair  Past Psychiatric History: see h&P Family History:  Family History  Problem Relation Age of Onset   Hypertension Mother    Breast cancer Mother    Melanoma Mother    Stroke Mother    Lung cancer Father 90   Colon cancer Neg Hx    Colitis Neg Hx    Cirrhosis Neg Hx    Liver disease Neg Hx    Pancreatic cancer Neg Hx    Pancreatitis Neg Hx    Anesthesia problems Neg Hx    Hypotension Neg Hx    Malignant hyperthermia Neg Hx    Pseudochol deficiency Neg Hx    Social History:  Social History   Substance and Sexual Activity  Alcohol  Use Not Currently     Social History   Substance and Sexual Activity  Drug Use No    Social History   Socioeconomic History   Marital status: Single    Spouse name: Not on file   Number of children: 0   Years of education: Not on file   Highest education level: Not on file  Occupational History   Occupation: disabled    Comment: anxiety  Tobacco Use   Smoking status: Every Day    Current packs/day: 1.00    Average packs/day: 1 pack/day for 21.7 years (21.7 ttl pk-yrs)    Types: Cigarettes, Cigars    Start date: 03/2002   Smokeless  tobacco: Never   Tobacco comments:    1 pack per week now as of 02/24/2018   Vaping Use   Vaping status: Never Used  Substance and Sexual Activity   Alcohol  use: Not Currently   Drug use: No   Sexual activity: Not on file  Other Topics Concern   Not on file  Social History Narrative   Right handed   Caffeine use: mostly water /OJ, tea sometimes.    Social Drivers of Corporate investment banker Strain: Low Risk  (02/24/2018)   Overall Financial Resource Strain (CARDIA)    Difficulty of Paying Living Expenses: Not very hard  Food Insecurity: Food Insecurity Present (11/22/2023)   Hunger Vital Sign    Worried About Running Out of Food in the Last Year: Often true    Ran Out of Food in the Last Year: Often true  Transportation Needs: Unmet Transportation Needs (11/22/2023)   PRAPARE - Administrator, Civil Service (Medical): Yes    Lack of Transportation (Non-Medical): Yes  Physical Activity: Not on file  Stress: Not on file  Social Connections: Not on file   Past Medical History:  Past Medical History:  Diagnosis Date   Alcohol  use    Anxiety    Disabled  due to panic attacks   Arthritis    Bilateral ureteral calculi    Borderline type 2 diabetes mellitus    BPH (benign prostatic hypertrophy)    Chronic back pain    Condylomata acuminata in male    multiple procedures --  penile, peri-rectal , perineum   DDD (degenerative disc disease), cervical    Depression    Dyspnea on exertion    Emphysematous COPD (HCC)    Epiretinal membrane (ERM) of both eyes    Fibromyalgia    GERD (gastroesophageal reflux disease)    Headache(784.0)    History of chronic pancreatitis    severeal adx for this /  2008  dx alcoholic pancreatitis   History of esophageal dilatation    History of hiatal hernia    History of kidney stones    History of panic attacks    History of sepsis    adx 05-01-2014--  urosepsis due to kidney stones obstruction/ hydronephrosis   History of  suicidal ideation    2006   adx   Hypertension    was on medication for short time, htn was caused by prednisone  per pt   Multiple sclerosis    pt. states has 4 small brain lesions   Nephrolithiasis    bilateral    Retinal vasculitis    Uveitis     Past Surgical History:  Procedure Laterality Date   BIOPSY  11/12/2012   Procedure: GASTRIC AND ESOPHAGEAL BIOPSIES;  Surgeon: Lamar CHRISTELLA Hollingshead, MD;  Location: [facility];  Service: Endoscopy;;   CATARACT EXTRACTION W/ INTRAOCULAR LENS  IMPLANT, BILATERAL     COLONOSCOPY  10/08/2011   Jenkins:Normal colon/Anal condyloma without extension proximal to dentate line   CYSTOSCOPY W/ URETERAL STENT PLACEMENT Bilateral 05/01/2014   Procedure: CYSTOSCOPY WITH BILATERAL RETROGRADE PYELOGRAM; BILATERAL URETERAL STENT PLACEMENT;  Surgeon: Alm Fragmin, MD;  Location: [facility];  Service: Urology;  Laterality: Bilateral;   CYSTOSCOPY W/ URETERAL STENT REMOVAL Bilateral 06/06/2014   Procedure: CYSTOSCOPY WITH STENT REMOVAL;  Surgeon: Garnette Shack, MD;  Location: [facility];  Service: Urology;  Laterality: Bilateral;   CYSTOSCOPY WITH URETEROSCOPY  06/06/2014   Procedure: CYSTOSCOPY WITH URETEROSCOPY;  Surgeon: Garnette Shack, MD;  Location: [facility];  Service: Urology;;   CYSTOSCOPY WITH URETEROSCOPY AND STENT PLACEMENT Bilateral 06/06/2014   Procedure: CYSTOSCOPY WITH J2 STENT EXTRACTION,,URETEROSCOPY WITH EXTRACTION OF STONES,;  Surgeon: Garnette Shack, MD;  Location: [facility];  Service: Urology;  Laterality: Bilateral;   ELECTROCAUTERY/ DESICCATION OF CONDYLOMA LESIONS  01-15-2008  &  12-29-2009   PENIS, PERI-RECTAL , PERINUEM   ESOPHAGOGASTRODUODENOSCOPY (EGD) WITH PROPOFOL  N/A 11/12/2012   MFM:Wnmfjo esophagus s/p  passage of a Maloney dilator and biopsy. Abnormal gastric mucosa-status post biopsy   FLEXIBLE BRONCHOSCOPY N/A 08/12/2012   Procedure: FLEXIBLE BRONCHOSCOPY;  Surgeon: Dallas LITTIE Gelineau, MD;   Location: [facility];  Service: Pulmonary;  Laterality: N/A;   MALONEY DILATION N/A 11/12/2012   Procedure: MALONEY DILATION (54mm);  Surgeon: Lamar CHRISTELLA Hollingshead, MD;  Location: [facility];  Service: Endoscopy;  Laterality: N/A;   MULTIPLE EXTRACTIONS WITH ALVEOLOPLASTY N/A 04/22/2014   Procedure: MULTIPLE EXTRACTIONS ( 2,3,5,6,7,8,9,10,11,13,14,15,21,28  WITH ALVEOLOPLASTY;  Surgeon: Glendia Primrose, DDS;  Location: [facility];  Service: Oral Surgery;  Laterality: N/A;   WISDOM TOOTH EXTRACTION      Current Medications: Current Facility-Administered Medications  Medication Dose Route Frequency Provider Last Rate Last Admin   acetaminophen  (TYLENOL ) tablet 650 mg  650 mg Oral Q6H  PRN Mannie Jerel PARAS, NP   650 mg at 11/25/23 2035   ALPRAZolam  (XANAX ) tablet 1 mg  1 mg Oral TID PRN Mannie Jerel PARAS, NP   1 mg at 11/25/23 1829   alum & mag hydroxide-simeth (MAALOX/MYLANTA) 200-200-20 MG/5ML suspension 30 mL  30 mL Oral Q4H PRN Mannie Jerel PARAS, NP       cholecalciferol  (VITAMIN D3) 25 MCG (1000 UNIT) tablet 1,000 Units  1,000 Units Oral Daily Mannie Jerel PARAS, NP   1,000 Units at 11/25/23 9092   cyanocobalamin (VITAMIN B12) tablet 2,000 mcg  2,000 mcg Oral Daily Mannie Jerel PARAS, NP   2,000 mcg at 11/25/23 9092   haloperidol (HALDOL) tablet 5 mg  5 mg Oral TID PRN Mannie Jerel PARAS, NP       And   diphenhydrAMINE (BENADRYL) capsule 50 mg  50 mg Oral TID PRN Mannie Jerel PARAS, NP       haloperidol lactate (HALDOL) injection 5 mg  5 mg Intramuscular TID PRN Mannie Jerel PARAS, NP       And   diphenhydrAMINE (BENADRYL) injection 50 mg  50 mg Intramuscular TID PRN Mannie Jerel PARAS, NP       And   LORazepam  (ATIVAN ) injection 2 mg  2 mg Intramuscular TID PRN Mannie Jerel PARAS, NP       haloperidol lactate (HALDOL) injection 10 mg  10 mg Intramuscular TID PRN Mannie Jerel PARAS, NP       And   diphenhydrAMINE (BENADRYL) injection 50 mg  50 mg Intramuscular TID PRN Mannie Jerel PARAS, NP       And   LORazepam  (ATIVAN ) injection  2 mg  2 mg Intramuscular TID PRN Mannie Jerel PARAS, NP       docusate sodium (COLACE) capsule 100 mg  100 mg Oral Daily Mannie Jerel PARAS, NP   100 mg at 11/25/23 0907   feeding supplement (KATE FARMS STANDARD 1.4) liquid 325 mL  325 mL Oral BID BM Chayim Bialas, MD   325 mL at 11/25/23 1830   FLUoxetine (PROZAC) 20 MG/5ML solution 10 mg  10 mg Oral Daily Camryn Quesinberry, MD   10 mg at 11/25/23 9092   lipase/protease/amylase (CREON ) capsule 36,000 Units  36,000 Units Oral TID WC Mannie Jerel PARAS, NP   36,000 Units at 11/25/23 1702   magnesium  hydroxide (MILK OF MAGNESIA) suspension 30 mL  30 mL Oral Daily PRN Mannie Jerel PARAS, NP   30 mL at 11/24/23 2125   [START ON 11/26/2023] multivitamin with minerals tablet 1 tablet  1 tablet Oral Daily Cortlan Dolin, MD       naphazoline-glycerin (CLEAR EYES REDNESS) ophth solution 1-2 drop  1-2 drop Both Eyes QID PRN Reyanna Baley, MD   2 drop at 11/25/23 1703   naproxen (NAPROSYN) tablet 250 mg  250 mg Oral BID WC Blas Riches, MD   250 mg at 11/25/23 1702   nicotine  (NICODERM CQ  - dosed in mg/24 hours) patch 14 mg  14 mg Transdermal Daily Mannie Jerel PARAS, NP   14 mg at 11/25/23 9092   simethicone  (MYLICON) chewable tablet 80 mg  80 mg Oral Q6H PRN Sherie Dobrowolski, MD   80 mg at 11/25/23 1447   traZODone (DESYREL) tablet 100 mg  100 mg Oral QHS Mannie Jerel PARAS, NP   100 mg at 11/25/23 2115    Lab Results: No results found for this or any previous visit (from the past 48 hours).  Blood Alcohol  level:  Lab  Results  Component Value Date   ETH <10 06/08/2019   ETH <10 11/27/2017    Metabolic Disorder Labs: Lab Results  Component Value Date   HGBA1C 5.2 06/09/2019   MPG 102.54 06/09/2019   No results found for: PROLACTIN Lab Results  Component Value Date   CHOL 93 11/27/2017   TRIG 94 02/25/2018   HDL 29 (L) 11/27/2017   CHOLHDL 3.2 11/27/2017   VLDL 28 11/27/2017   LDLCALC 36 11/27/2017    Physical Findings: AIMS:  , ,  ,  ,     CIWA:    COWS:      Psychiatric Specialty Exam:  Presentation  General Appearance:  Appropriate for Environment  Eye Contact: Fair  Speech: Normal Rate  Speech Volume: Normal    Mood and Affect  Mood: Depressed; Dysphoric  Affect: Depressed; Flat   Thought Process  Thought Processes: Coherent  Descriptions of Associations:Intact  Orientation:Full (Time, Place and Person)  Thought Content:Logical  Hallucinations:No data recorded  Ideas of Reference:None  Suicidal Thoughts:No data recorded  Homicidal Thoughts:No data recorded   Sensorium  Memory: Immediate Fair; Recent Fair; Remote Fair  Judgment: Impaired  Insight: Shallow   Executive Functions  Concentration: Fair  Attention Span: Fair  Recall: [employer] of Knowledge: Fair  Language: Fair   Psychomotor Activity  Psychomotor Activity: No data recorded  Musculoskeletal: Strength & Muscle Tone: within normal limits Gait & Station: normal Assets  Assets: Manufacturing systems engineer; Desire for Improvement; Resilience    Physical Exam: Physical Exam Vitals and nursing note reviewed.    ROS Blood pressure 98/79, pulse 95, temperature 99.9 F (37.7 C), resp. rate 14, height 5' 7 (1.702 m), weight 43.8 kg, SpO2 95%. Body mass index is 15.11 kg/m.  Diagnosis: Principal Problem:   MDD (major depressive disorder), recurrent severe, without psychosis (HCC)   PLAN: Safety and Monitoring:  -- Voluntary admission to inpatient psychiatric unit for safety, stabilization and treatment  -- Daily contact with patient to assess and evaluate symptoms and progress in treatment  -- Patient's case to be discussed in multi-disciplinary team meeting  -- Observation Level : q15 minute checks  -- Vital signs:  q12 hours  -- Precautions: suicide, elopement, and assault -- Encouraged patient to participate in unit milieu and in scheduled group therapies  2. Psychiatric Diagnoses and  Treatment:    Fluoxetine syrup 10 mg daily Trazodone 100 mg qhs    3. Medical Issues Being Addressed:   GJ tube Dietician consult placed regarding tube feeding as per patient request  4. Discharge Planning:   -- Social work and case management to assist with discharge planning and identification of hospital follow-up needs prior to discharge  -- Estimated LOS: 3-4 days  Allyn Foil, MD 11/25/2023, 10:28 PM

## 2023-11-25 NOTE — Group Note (Signed)
 Date:  11/25/2023 Time:  11:10 AM  Group Topic/Focus:  Managing Feelings:   The focus of this group is to identify what feelings patients have difficulty handling and develop a plan to handle them in a healthier way upon discharge.    Participation Level:  Active  Participation Quality:  Appropriate  Affect:  Appropriate  Cognitive:  Appropriate  Insight: Appropriate  Engagement in Group:  Engaged  Modes of Intervention:  Discussion   Brent [name] 11/25/2023, 11:10 AM

## 2023-11-25 NOTE — Group Note (Signed)
 Recreation Therapy Group Note   Group Topic:Health and Wellness  Group Date: 11/25/2023 Start Time: 1345 End Time: 1430 Facilitators: Celestia Jeoffrey BRAVO, LRT, CTRS Location: Courtyard  Group Description: Outdoor Recreation. Patients had the option to play corn hole, ring toss, UNO, or listening to music while outside in the courtyard getting fresh air and sunlight. Patients helped water  and prune the raised garden beds. LRT and patients discussed things that they enjoy doing in their free time outside of the hospital. LRT encouraged patients to drink water  after being active and getting their heart rate up.   Goal Area(s) Addressed: Patient will identify leisure interests.  Patient will practice healthy decision making. Patient will engage in recreation activity.   Affect/Mood: N/A   Participation Level: Did not attend    Clinical Observations/Individualized Feedback: Patient did not attend group.   Plan: Continue to engage patient in RT group sessions 2-3x/week.   Jeoffrey BRAVO Celestia, LRT, CTRS 11/25/2023 2:40 PM

## 2023-11-25 NOTE — Group Note (Signed)
 [facility] LCSW Group Therapy Note    Group Date: 11/25/2023 Start Time: 1315 End Time: 1345  Type of Therapy and Topic:  Group Therapy:  Overcoming Obstacles  Participation Level:  [facility] PARTICIPATION LEVEL: Did Not Attend  Mood:  Description of Group:   In this group patients will be encouraged to explore what they see as obstacles to their own wellness and recovery. They will be guided to discuss their thoughts, feelings, and behaviors related to these obstacles. The group will process together ways to cope with barriers, with attention given to specific choices patients can make. Each patient will be challenged to identify changes they are motivated to make in order to overcome their obstacles. This group will be process-oriented, with patients participating in exploration of their own experiences as well as giving and receiving support and challenge from other group members.  Therapeutic Goals: 1. Patient will identify personal and current obstacles as they relate to admission. 2. Patient will identify barriers that currently interfere with their wellness or overcoming obstacles.  3. Patient will identify feelings, thought process and behaviors related to these barriers. 4. Patient will identify two changes they are willing to make to overcome these obstacles:    Summary of Patient Progress   X   Therapeutic Modalities:   Cognitive Behavioral Therapy Solution Focused Therapy Motivational Interviewing Relapse Prevention Therapy   Lum [name], LCSWA

## 2023-11-25 NOTE — Progress Notes (Addendum)
 Initial Nutrition Assessment  DOCUMENTATION CODES:   Underweight  INTERVENTION:   [place] 1.4 PO BID, each supplement provides 455 kcal and 20 grams protein.   MVI po daily   Continue Creon  TID with meals   Pt is at high refeed risk  NUTRITION DIAGNOSIS:   Inadequate oral intake related to chronic illness as evidenced by meal completion < 25%.  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  Met with pt in his room today. Pt is very pleasant. Pt reports ongoing issues with his feeding tube ever since having it placed. Pt reports ongoing bloating, sharp abdominal pains, constant belching and some nausea after eating that he relates to the tube moving around inside of him. Pt denies regular diarrhea but reports that he did have some diarrhea about two weeks ago. Pt is actively belching during RD visit. Pt's abdomen does appear distended on exam today. Pt reports that he does not have these issues with the tube feeds but only when he eats by mouth. Pt reports some leaking of intestinal acid from around his G-tube site; pt does have some minor skin irritation noted today. Pt reports that he has been using Nutren 2.0, 4 cartons per day on about 3 days per week. Pt gives each  bolus on a pump that he runs at 166ml/hr via the J-port. Pt only uses the G-port for water ; he takes all his medication by mouth. Pt reports that he eats tuna, salmon and some pudding by mouth. Pt also reports drinking skim milk sometimes but reports that he can only take dairy in small increments. Pt is unable to drink supplements as they tear up my stomach. Pt reports that he takes creon  every time he eats by mouth. Pt reports that he has been losing weight and unable to gain anything. Pt takes vitamin D3 and B12 daily at home. Pt reports that he has not drank any alcohol  since 2012. Per chart, pt is down ~17lbs over the past 1-1.5 years.   Pt appears very thin and cachetic on gross exam today. RD unsure if patient is malabsorbing nutrients or if he is non compliant with the tube feeds at home. It does not sound like he does the tube feeds every day. Pt receives his tube feed formula from [facility]; RD spoke with a representative at [employer] who reports that patient has been getting supplies regularly but that his last shipment would have lasted through 10/1 and patient has not yet called for a refill.   RD spoke with patient at length today regarding his current nutritional status and the recommendation for him to continue his tube feeds daily as he needs to gain wait. Pt admits that he has been depressed and feeling down for the past 3 months so he has not really been caring for himself  like he should. Recommended for patient to continue creon  with meals. Pt may benefit from relizorb given with his tube feeds. Recommended for patient to take a daily MVI. Recommended for patient to speak with his surgeon to see if he may be able to try taking the tube feeds via the G-port. If patient is able to tolerate gastric feeds, this would give him back a significant amount of absorptive space and he may be able to have the J-tube exchanged for a regular G-tube. Pt is at risk for SIBO and does have  symptoms supporting this such as bloating and nausea after eating. RD recommended for patient to discuss this with his MD but in the meantime, he can start eating foods such as yogurt and keifer to add back in some healthy bacteria into his gut. RD discussed narcotic use with patient and related how this will slow down the GI tract and make his symptoms much worse.    For now, as we can not do nutrition support in the geropsychiatric unit, pt is agreeable to try drinking the [employer] nutritional supplement which is plant based and may be better tolerated than Ensure (likes vanilla). RD will add MVI daily. Pt is at high refeed risk. Pt plans to resume his tube feeds once he is back home.   Medications reviewed and include: D3, B12, creon , nicotine   Labs reviewed:   Diet Order:   Diet Order             DIET SOFT Room service appropriate? Yes; Fluid consistency: Thin  Diet effective now                  EDUCATION NEEDS:   Education needs have been addressed  Skin:  Skin Assessment: Reviewed RN Assessment (lacertation L eye)  Last BM:  10/13  Height:   Ht Readings from Last 1 Encounters:  11/22/23 5' 7 (1.702 m)    Weight:   Wt Readings from Last 1 Encounters:  11/22/23 43.8 kg    Ideal Body Weight:  67 kg  BMI:  Body mass index is 15.11 kg/m.  Estimated Nutritional Needs:   Kcal:  1700-2000kcal/day  Protein:  85-100g/day  Fluid:  1.4-1.6L/day  Augustin Shams MS, RD, LDN If unable to be reached, please send secure chat to RD inpatient available from 8:00a-4:00p daily

## 2023-11-25 NOTE — Group Note (Signed)
 Date:  11/25/2023 Time:  11:09 PM  Group Topic/Focus:  Wrap-Up Group:   The focus of this group is to help patients review their daily goal of treatment and discuss progress on daily workbooks.    Participation Level:  Did Not Attend  Participation Quality:     Affect:     Cognitive:     Insight: None  Engagement in Group:  None  Modes of Intervention:     Additional Comments:    Brent [name] 11/25/2023, 11:09 PM

## 2023-11-25 NOTE — Progress Notes (Signed)
 Pt is A&Ox4. Denies SI/HI/AVH. Denies depression, but reports increasing anxiety. PRN Xanax  1mg  given as per order. Pt also reported not having a BM since 10/09 and passing excessive gas. PRN simethicone  80mg  and Milk of mag given as per order. He is safe on the unit at this time with Q15 min safety checks in place.

## 2023-11-25 NOTE — Plan of Care (Signed)
  Problem: Coping: Goal: Will verbalize feelings Outcome: Progressing   Problem: Health Behavior/Discharge Planning: Goal: Compliance with therapeutic regimen will improve Outcome: Progressing   Problem: Nutrition Goal: Patient maintains adequate hydration Outcome: Progressing

## 2023-11-25 NOTE — Progress Notes (Signed)
   11/25/23 1000  Psych Admission Type (Psych Patients Only)  Admission Status Voluntary  Psychosocial Assessment  Patient Complaints Anxiety  Eye Contact Fair  Facial Expression Flat  Affect Anxious  Speech Slow  Interaction Assertive  Motor Activity Slow  Appearance/Hygiene In scrubs  Behavior Characteristics Cooperative  Mood Anxious  Thought Process  Coherency WDL  Content WDL  Delusions None reported or observed  Perception WDL  Hallucination None reported or observed  Judgment Impaired  Confusion Mild  Danger to Self  Current suicidal ideation? Denies  Danger to Others  Danger to Others None reported or observed

## 2023-11-25 NOTE — Group Note (Signed)
 Recreation Therapy Group Note   Group Topic:Relaxation  Group Date: 11/25/2023 Start Time: 1100 End Time: 1130 Facilitators: Celestia Jeoffrey BRAVO, LRT, CTRS Location: Dayroom  Group Description: Chair Yoga. LRT and patients discussed the benefits of yoga and how it differs from strength exercises. LRT educated patients on the mental and physical benefits of yoga and deep breathing and how it can be used as a Associate Professor. LRT instructed patients on different stretching and yoga poses to complete that focused on all parts of the body, as well as deep breathing. Pt encouraged to stop movement at any time if they feel discomfort or pain.   Goal Area(s) Addressed: Patient will practice using relaxation technique. Patient will identify a new coping skill.  Patient will follow multistep directions to reduce anxiety and stress.   Affect/Mood: Appropriate   Participation Level: Minimal    Clinical Observations/Individualized Feedback: Brent [name] was present for the first 5 minutes. Pt was then pulled by the dietitian.   Plan: Continue to engage patient in RT group sessions 2-3x/week.   Jeoffrey BRAVO Celestia, LRT, CTRS 11/25/2023 12:26 PM

## 2023-11-26 MED ORDER — ALPRAZOLAM 1 MG PO TABS: 1.0000 mg | ORAL_TABLET | Freq: Three times a day (TID) | ORAL | 0 refills | Status: DC | PRN

## 2023-11-26 MED ORDER — KATE FARMS STANDARD 1.4 PO LIQD: 325.0000 mL | Freq: Two times a day (BID) | ORAL | 0 refills | Status: AC

## 2023-11-26 MED ORDER — TRAZODONE HCL 100 MG PO TABS: 100.0000 mg | ORAL_TABLET | Freq: Every day | ORAL | 0 refills | Status: DC

## 2023-11-26 MED ORDER — NICOTINE 14 MG/24HR TD PT24: 14.0000 mg | MEDICATED_PATCH | Freq: Every day | TRANSDERMAL | 0 refills | Status: DC

## 2023-11-26 MED ORDER — NAPHAZOLINE-GLYCERIN 0.012-0.25 % OP SOLN: 1.0000 [drp] | Freq: Four times a day (QID) | OPHTHALMIC | 0 refills | Status: AC | PRN

## 2023-11-26 MED ORDER — ADULT MULTIVITAMIN W/MINERALS CH: 1.0000 | ORAL_TABLET | Freq: Every day | ORAL | 0 refills | Status: AC

## 2023-11-26 MED ORDER — FLUOXETINE HCL 20 MG/5ML PO SOLN: 10.0000 mg | Freq: Every day | ORAL | 0 refills | Status: DC

## 2023-11-26 MED ORDER — SIMETHICONE 80 MG PO CHEW: 80.0000 mg | CHEWABLE_TABLET | Freq: Four times a day (QID) | ORAL | 0 refills | Status: DC | PRN

## 2023-11-26 NOTE — [facility] Suicide Risk Assessment (Signed)
 [facility] Discharge Suicide Risk Assessment   Principal Problem: MDD (major depressive disorder), recurrent severe, without psychosis (HCC) Discharge Diagnoses: Principal Problem:   MDD (major depressive disorder), recurrent severe, without psychosis (HCC)   Total Time spent with patient: 30 minutes  Musculoskeletal: Strength & Muscle Tone: within normal limits Gait & Station: normal Patient leans: N/A  Psychiatric Specialty Exam  Presentation  General Appearance:  Appropriate for Environment; Casual  Eye Contact: Fair  Speech: Clear and Coherent  Speech Volume: Normal  Handedness: Right   Mood and Affect  Mood: Euthymic  Duration of Depression Symptoms: No data recorded Affect: Appropriate   Thought Process  Thought Processes: Coherent  Descriptions of Associations:Intact  Orientation:Full (Time, Place and Person)  Thought Content:Logical  History of Schizophrenia/Schizoaffective disorder:No data recorded Duration of Psychotic Symptoms:No data recorded Hallucinations:Hallucinations: None  Ideas of Reference:None  Suicidal Thoughts:Suicidal Thoughts: No  Homicidal Thoughts:Homicidal Thoughts: No   Sensorium  Memory: Immediate Fair; Recent Fair  Judgment: Fair  Insight: Fair   Art therapist  Concentration: Fair  Attention Span: Fair  Recall: [employer] of Knowledge: Fair  Language: Fair   Psychomotor Activity  Psychomotor Activity: Psychomotor Activity: Normal   Assets  Assets: Communication Skills; Desire for Improvement; Resilience; Social Support   Sleep  Sleep: Sleep: Fair  Estimated Sleeping Duration (Last 24 Hours): 11.00-13.25 hours  Physical Exam: Physical Exam Vitals and nursing note reviewed.    ROS Blood pressure 93/77, pulse (!) 119, temperature 99.9 F (37.7 C), resp. rate 18, height 5' 7 (1.702 m), weight 43.8 kg, SpO2 95%. Body mass index is 15.11 kg/m.  Mental Status Per Nursing  Assessment::   On Admission:  NA  Demographic Factors:  Low socioeconomic status  Loss Factors: Decrease in vocational status  Historical Factors: Impulsivity  Risk Reduction Factors:   Living with another person, especially a relative, Positive social support, Positive therapeutic relationship, and Positive coping skills or problem solving skills  Continued Clinical Symptoms:  Depression:   Impulsivity  Cognitive Features That Contribute To Risk:  None    Suicide Risk:  Minimal: No identifiable suicidal ideation.  Patients presenting with no risk factors but with morbid ruminations; may be classified as minimal risk based on the severity of the depressive symptoms    Plan Of Care/Follow-up recommendations:  Activity:  as tolerated  Allyn Foil, MD 11/26/2023, 10:06 PM

## 2023-11-26 NOTE — Plan of Care (Signed)
   Problem: Activity: Goal: Interest or engagement in leisure activities will improve Outcome: Progressing Goal: Imbalance in normal sleep/wake cycle will improve Outcome: Progressing

## 2023-11-26 NOTE — Group Note (Signed)
 Date:  11/26/2023 Time:  10:56 AM  Group Topic/Focus:  Self Care:   The focus of this group is to help patients understand the importance of self-care in order to improve or restore emotional, physical, spiritual, interpersonal, and financial health. We also discussed health eating habits and hygiene.     Participation Level:  Did Not Attend   Leigh VEAR Pais 11/26/2023, 10:56 AM

## 2023-11-26 NOTE — Progress Notes (Signed)
   11/26/23 1000  Psych Admission Type (Psych Patients Only)  Admission Status Voluntary  Psychosocial Assessment  Patient Complaints Anxiety  Eye Contact Fair  Facial Expression Flat  Affect Anxious  Speech Slow  Interaction Assertive  Motor Activity Slow  Appearance/Hygiene In scrubs  Behavior Characteristics Cooperative  Mood Anxious  Thought Process  Coherency WDL  Content WDL  Delusions None reported or observed  Perception WDL  Hallucination None reported or observed  Judgment Impaired  Confusion Mild  Danger to Self  Current suicidal ideation? Denies  Danger to Others  Danger to Others None reported or observed

## 2023-11-26 NOTE — Progress Notes (Signed)
   11/26/23 0033  Psych Admission Type (Psych Patients Only)  Admission Status Voluntary  Psychosocial Assessment  Patient Complaints Anxiety  Eye Contact Fair  Facial Expression Anxious  Affect Anxious  Speech Slow  Interaction Assertive  Motor Activity Slow  Appearance/Hygiene In scrubs;Poor hygiene  Behavior Characteristics Cooperative  Mood Anxious  Thought Process  Coherency WDL  Content WDL  Delusions None reported or observed  Perception WDL  Hallucination None reported or observed  Judgment Impaired  Confusion Mild  Danger to Self  Current suicidal ideation? Denies  Danger to Others  Danger to Others None reported or observed     Estimated Sleeping Duration (Last 24 Hours): 7.25-8.75 hours

## 2023-11-26 NOTE — Group Note (Signed)
 Date:  11/26/2023 Time:  9:30 PM  Group Topic/Focus:  Wrap-Up Group:   The focus of this group is to help patients review their daily goal of treatment and discuss progress on daily workbooks.    Participation Level:  Active  Participation Quality:  Appropriate  Affect:  Appropriate  Cognitive:  Alert  Insight: Appropriate  Engagement in Group:  Engaged  Modes of Intervention:  Discussion  Additional Comments:    Brent [name] Bunker 11/26/2023, 9:30 PM

## 2023-11-26 NOTE — Plan of Care (Signed)
  Problem: Coping: Goal: Coping ability will improve Outcome: Progressing Goal: Will verbalize feelings Outcome: Progressing   Problem: Health Behavior/Discharge Planning: Goal: Compliance with therapeutic regimen will improve Outcome: Progressing

## 2023-11-27 NOTE — Group Note (Signed)
 Date:  11/27/2023 Time:  10:55 AM  Group Topic/Focus:  Exercise Group:  The focus of the group is to encourage patients to watch a video which demonstrated different exercises to perform while in a seated position.  Patients were given an explanation on how each movements could benefit their physical and mental health in the long run.    Participation Level:  Did Not Attend   Camellia HERO Gomer [place] 11/27/2023, 10:55 AM

## 2023-11-27 NOTE — Progress Notes (Signed)
 Patient reports feeling agitated earlier in shift, prn given for agitation and sleep. Denies SI, Hi, AVH. Did attend group. Xanax  given for anxiety.

## 2023-11-27 NOTE — Discharge Summary (Signed)
 Physician Discharge Summary Note  Patient:  Brent [name] is an 53 y.o., male MRN:  [id number] DOB:  [birth year] Patient phone:  [phone] (home)  Patient address:   [address] [place] [address],   Total time spent: 40 min Date of Admission:  11/22/2023 Date of Discharge: 11/27/23  Reason for Admission:  53 year old male with multiple medical problems and psychiatric problems presents the ER today secondary to depression and suicidal thoughts. Patient was seen earlier for abdominal pain and does not appear that he brought up his suicidality. He was cleared medically at that time. He states that he did not tell the previous provider that he was not want to be on this earth and wanted to die and if he had a gun would kill himself. Patient is admitted to [facility] unit with Q15 min safety monitoring. Multidisciplinary team approach is offered. Medication management; group/milieu therapy is offered.   Principal Problem: MDD (major depressive disorder), recurrent severe, without psychosis (HCC) Discharge Diagnoses: Principal Problem:   MDD (major depressive disorder), recurrent severe, without psychosis (HCC)   Past Psychiatric History: see h&p  Family Psychiatric  History: see h&p Social History:  Social History   Substance and Sexual Activity  Alcohol  Use Not Currently     Social History   Substance and Sexual Activity  Drug Use No    Social History   Socioeconomic History   Marital status: Single    Spouse name: Not on file   Number of children: 0   Years of education: Not on file   Highest education level: Not on file  Occupational History   Occupation: disabled    Comment: anxiety  Tobacco Use   Smoking status: Every Day    Current packs/day: 1.00    Average packs/day: 1 pack/day for 21.7 years (21.7 ttl pk-yrs)    Types: Cigarettes, Cigars    Start date: 03/2002   Smokeless tobacco: Never   Tobacco comments:    1 pack per week now as of  02/24/2018   Vaping Use   Vaping status: Never Used  Substance and Sexual Activity   Alcohol  use: Not Currently   Drug use: No   Sexual activity: Not on file  Other Topics Concern   Not on file  Social History Narrative   Right handed   Caffeine use: mostly water /OJ, tea sometimes.    Social Drivers of Corporate investment banker Strain: Low Risk  (02/24/2018)   Overall Financial Resource Strain (CARDIA)    Difficulty of Paying Living Expenses: Not very hard  Food Insecurity: Food Insecurity Present (11/22/2023)   Hunger Vital Sign    Worried About Programme researcher, broadcasting/film/video in the Last Year: Often true    Ran Out of Food in the Last Year: Often true  Transportation Needs: Unmet Transportation Needs (11/22/2023)   PRAPARE - Administrator, Civil Service (Medical): Yes    Lack of Transportation (Non-Medical): Yes  Physical Activity: Not on file  Stress: Not on file  Social Connections: Not on file   Past Medical History:  Past Medical History:  Diagnosis Date   Alcohol  use    Anxiety    Disabled due to panic attacks   Arthritis    Bilateral ureteral calculi    Borderline type 2 diabetes mellitus    BPH (benign prostatic hypertrophy)    Chronic back pain    Condylomata acuminata in male    multiple procedures --  penile,  peri-rectal , perineum   DDD (degenerative disc disease), cervical    Depression    Dyspnea on exertion    Emphysematous COPD (HCC)    Epiretinal membrane (ERM) of both eyes    Fibromyalgia    GERD (gastroesophageal reflux disease)    Headache(784.0)    History of chronic pancreatitis    severeal adx for this /  2008  dx alcoholic pancreatitis   History of esophageal dilatation    History of hiatal hernia    History of kidney stones    History of panic attacks    History of sepsis    adx 05-01-2014--  urosepsis due to kidney stones obstruction/ hydronephrosis   History of suicidal ideation    2006   adx   Hypertension    was on medication  for short time, htn was caused by prednisone  per pt   Multiple sclerosis    pt. states has 4 small brain lesions   Nephrolithiasis    bilateral    Retinal vasculitis    Uveitis     Past Surgical History:  Procedure Laterality Date   BIOPSY  11/12/2012   Procedure: GASTRIC AND ESOPHAGEAL BIOPSIES;  Surgeon: Lamar CHRISTELLA Hollingshead, MD;  Location: [facility];  Service: Endoscopy;;   CATARACT EXTRACTION W/ INTRAOCULAR LENS  IMPLANT, BILATERAL     COLONOSCOPY  10/08/2011   Jenkins:Normal colon/Anal condyloma without extension proximal to dentate line   CYSTOSCOPY W/ URETERAL STENT PLACEMENT Bilateral 05/01/2014   Procedure: CYSTOSCOPY WITH BILATERAL RETROGRADE PYELOGRAM; BILATERAL URETERAL STENT PLACEMENT;  Surgeon: Alm Fragmin, MD;  Location: [facility];  Service: Urology;  Laterality: Bilateral;   CYSTOSCOPY W/ URETERAL STENT REMOVAL Bilateral 06/06/2014   Procedure: CYSTOSCOPY WITH STENT REMOVAL;  Surgeon: Garnette Shack, MD;  Location: [facility];  Service: Urology;  Laterality: Bilateral;   CYSTOSCOPY WITH URETEROSCOPY  06/06/2014   Procedure: CYSTOSCOPY WITH URETEROSCOPY;  Surgeon: Garnette Shack, MD;  Location: [facility];  Service: Urology;;   CYSTOSCOPY WITH URETEROSCOPY AND STENT PLACEMENT Bilateral 06/06/2014   Procedure: CYSTOSCOPY WITH J2 STENT EXTRACTION,,URETEROSCOPY WITH EXTRACTION OF STONES,;  Surgeon: Garnette Shack, MD;  Location: [facility];  Service: Urology;  Laterality: Bilateral;   ELECTROCAUTERY/ DESICCATION OF CONDYLOMA LESIONS  01-15-2008  &  12-29-2009   PENIS, PERI-RECTAL , PERINUEM   ESOPHAGOGASTRODUODENOSCOPY (EGD) WITH PROPOFOL  N/A 11/12/2012   MFM:Wnmfjo esophagus s/p  passage of a Maloney dilator and biopsy. Abnormal gastric mucosa-status post biopsy   FLEXIBLE BRONCHOSCOPY N/A 08/12/2012   Procedure: FLEXIBLE BRONCHOSCOPY;  Surgeon: Dallas LITTIE Gelineau, MD;  Location: [facility];  Service: Pulmonary;  Laterality: N/A;   MALONEY  DILATION N/A 11/12/2012   Procedure: MALONEY DILATION (54mm);  Surgeon: Lamar CHRISTELLA Hollingshead, MD;  Location: [facility];  Service: Endoscopy;  Laterality: N/A;   MULTIPLE EXTRACTIONS WITH ALVEOLOPLASTY N/A 04/22/2014   Procedure: MULTIPLE EXTRACTIONS ( 2,3,5,6,7,8,9,10,11,13,14,15,21,28  WITH ALVEOLOPLASTY;  Surgeon: Glendia Primrose, DDS;  Location: [facility];  Service: Oral Surgery;  Laterality: N/A;   WISDOM TOOTH EXTRACTION     Family History:  Family History  Problem Relation Age of Onset   Hypertension Mother    Breast cancer Mother    Melanoma Mother    Stroke Mother    Lung cancer Father 71   Colon cancer Neg Hx    Colitis Neg Hx    Cirrhosis Neg Hx    Liver disease Neg Hx    Pancreatic cancer Neg Hx    Pancreatitis Neg Hx  Anesthesia problems Neg Hx    Hypotension Neg Hx    Malignant hyperthermia Neg Hx    Pseudochol deficiency Neg Hx     Hospital Course:  53 year old male with multiple medical problems and psychiatric problems presents the ER today secondary to depression and suicidal thoughts. Patient was seen earlier for abdominal pain and does not appear that he brought up his suicidality. He was cleared medically at that time. He states that he did not tell the previous provider that he was not want to be on this earth and wanted to die and if he had a gun would kill himself. Patient is admitted to [facility] unit with Q15 min safety monitoring. Multidisciplinary team approach is offered. Medication management; group/milieu therapy is offered.  Detailed risk assessment is complete based on clinical exam and individual risk factors and acute suicide risk is low and acute violence risk is low.    On admission, patient reports depression in the context of multiple medical problems and physical limitation.  He was started on Prozac which was changed to liquid.  Patient tolerated the medications very well with no reported side effect.  Patient denies SI/HI/plan and denies auditory/visual  hallucinations on the day of discharge.  He remains future oriented and is willing to participate in outpatient mental health resources. Currently, all modifiable risk of harm to self/harm to others have been addressed and patient is no longer appropriate for the acute inpatient setting and is able to continue treatment for mental health needs in the community with the supports as indicated below.  Patient is educated and verbalized understanding of discharge plan of care including medications, follow-up appointments, mental health resources and further crisis services in the community.  He is instructed to call 911 or present to the nearest emergency room should he experience any decompensation in mood, disturbance of bowel or return of suicidal/homicidal ideations.  Patient verbalizes understanding of this education and agrees to this plan of care  Physical Findings: AIMS:  , ,  ,  ,    CIWA:    COWS:        Psychiatric Specialty Exam:  Presentation  General Appearance:  Appropriate for Environment; Casual  Eye Contact: Fair  Speech: Clear and Coherent  Speech Volume: Normal    Mood and Affect  Mood: Euthymic  Affect: Appropriate   Thought Process  Thought Processes: Coherent  Descriptions of Associations:Intact  Orientation:Full (Time, Place and Person)  Thought Content:Logical  Hallucinations:Hallucinations: None  Ideas of Reference:None  Suicidal Thoughts:Suicidal Thoughts: No  Homicidal Thoughts:Homicidal Thoughts: No   Sensorium  Memory: Immediate Fair; Recent Fair  Judgment: Fair  Insight: Fair   Art therapist  Concentration: Fair  Attention Span: Fair  Recall: [employer] of Knowledge: Fair  Language: Fair   Psychomotor Activity  Psychomotor Activity: Psychomotor Activity: Normal  Musculoskeletal: Strength & Muscle Tone: within normal limits Gait & Station: normal Assets  Assets: Manufacturing systems engineer; Desire for  Improvement; Resilience; Social Support   Sleep  Sleep: Sleep: Fair    Physical Exam: Physical Exam ROS Blood pressure 92/67, pulse 96, temperature 98.1 F (36.7 C), resp. rate 18, height 5' 7 (1.702 m), weight 43.8 kg, SpO2 98%. Body mass index is 15.11 kg/m.   Social History   Tobacco Use  Smoking Status Every Day   Current packs/day: 1.00   Average packs/day: 1 pack/day for 21.7 years (21.7 ttl pk-yrs)   Types: Cigarettes, Cigars   Start date: 03/2002  Smokeless Tobacco Never  Tobacco Comments   1 pack per week now as of 02/24/2018    Tobacco Cessation:  A prescription for an FDA-approved tobacco cessation medication provided at discharge   Blood Alcohol  level:  Lab Results  Component Value Date   [facility] <10 06/08/2019   ETH <10 11/27/2017    Metabolic Disorder Labs:  Lab Results  Component Value Date   HGBA1C 5.2 06/09/2019   MPG 102.54 06/09/2019   No results found for: PROLACTIN Lab Results  Component Value Date   CHOL 93 11/27/2017   TRIG 94 02/25/2018   HDL 29 (L) 11/27/2017   CHOLHDL 3.2 11/27/2017   VLDL 28 11/27/2017   LDLCALC 36 11/27/2017    See Psychiatric Specialty Exam and Suicide Risk Assessment completed by Attending Physician prior to discharge.  Discharge destination:  Home  Is patient on multiple antipsychotic therapies at discharge:  No   Has Patient had three or more failed trials of antipsychotic monotherapy by history:  No  Recommended Plan for Multiple Antipsychotic Therapies: NA   Allergies as of 11/27/2023   No Known Allergies      Medication List     STOP taking these medications    prednisoLONE  acetate 1 % ophthalmic suspension Commonly known as: PRED FORTE        TAKE these medications      Indication  ALPRAZolam  1 MG tablet Commonly known as: XANAX  Take 1 tablet (1 mg total) by mouth 3 (three) times daily as needed for up to 15 days for anxiety (reordered home medication). What changed:  when to  take this reasons to take this  Indication: Panic Disorder   Creon  24000-76000 units Cpep Generic drug: Pancrelipase  (Lip-Prot-Amyl) Take 1-3 capsules by mouth See admin instructions. Take 3 capsules by mouth three times daily with each meal and take 1 capsule by mouth daily with each snack.    cyanocobalamin 1000 MCG tablet Commonly known as: VITAMIN B12 Take 2,000 mcg by mouth daily.    docusate sodium 100 MG capsule Commonly known as: COLACE Take 100 mg by mouth daily.    feeding supplement (KATE FARMS STANDARD 1.4) Liqd liquid Take 325 mLs by mouth 2 (two) times daily between meals.    FLUoxetine 20 MG/5ML solution Commonly known as: PROZAC Take 2.5 mLs (10 mg total) by mouth daily.    ketorolac  0.5 % ophthalmic solution Commonly known as: ACULAR  Place 2 drops into both eyes 4 (four) times daily.    multivitamin with minerals Tabs tablet Take 1 tablet by mouth daily.    naphazoline-glycerin Soln Commonly known as: CLEAR EYES REDNESS Place 1-2 drops into both eyes 4 (four) times daily as needed for eye irritation.    naproxen 500 MG tablet Commonly known as: Naprosyn Take 1 tablet (500 mg total) by mouth 2 (two) times daily with a meal.    nicotine  14 mg/24hr patch Commonly known as: NICODERM CQ  - dosed in mg/24 hours Place 1 patch (14 mg total) onto the skin daily.    omeprazole 40 MG capsule Commonly known as: PRILOSEC Take 40 mg by mouth at bedtime.    ondansetron  4 MG tablet Commonly known as: ZOFRAN  Take 4 mg by mouth every 8 (eight) hours as needed for nausea.    simethicone  80 MG chewable tablet Commonly known as: MYLICON Chew 1 tablet (80 mg total) by mouth every 6 (six) hours as needed for flatulence.    Symbicort 80-4.5 MCG/ACT inhaler Generic drug: budesonide-formoterol  Inhale 1 puff into the lungs 2 (  two) times daily.    traZODone 100 MG tablet Commonly known as: DESYREL Take 1 tablet (100 mg total) by mouth at bedtime.  Indication: Trouble  Sleeping   Vitamin D  High Potency 25 MCG (1000 UT) capsule Generic drug: Cholecalciferol  Take 1,000 Units by mouth daily.         Follow-up Information     [facility] Follow up on 12/05/2023.   Why: Augustino Savastano scheduled for 12/05/23 at 8am and it will be a virtual appointment. Contact information: [address]  [address] [place] [address] [phone]                 Follow-up recommendations:  Activity:  as tolerated    Signed: Addalyn Speedy, MD 11/27/2023, 6:50 PM

## 2023-11-27 NOTE — Progress Notes (Signed)
 Mood/Behavior:  Pleasant and cooperative.  Flat affect.    Psych assessment:  Endorses anxiety.  Denies SI/HI and AVH.  Interaction / Group attendance:  Present in the milieu for meals.  Does not attend groups.  Appropriate interaction with peers and staff.  Medication/ PRNs: Compliant.  PRN medication for gas and anxiety given as ordered.   Pain: 8/20  (LUQ, head)  15 min checks in place for safety.

## 2023-11-27 NOTE — Care Management Important Message (Signed)
 Important Message  Patient Details  Name: Brent [name] MRN: [id number] Date of Birth: January 02, 1971   Important Message Given:  Yes - Medicare IM     Lum [name], LCSWA 11/27/2023, 10:36 AM

## 2023-11-27 NOTE — Plan of Care (Signed)
  Problem: Activity: Goal: Interest or engagement in leisure activities will improve Outcome: Progressing   Problem: Coping: Goal: Coping ability will improve Outcome: Progressing Goal: Will verbalize feelings Outcome: Progressing   Problem: Coping: Goal: Will verbalize feelings Outcome: Progressing   Problem: Health Behavior/Discharge Planning: Goal: Compliance with therapeutic regimen will improve Outcome: Progressing

## 2023-11-27 NOTE — Progress Notes (Signed)
  [facility] Adult Case Management Discharge Plan :  Will you be returning to the same living situation after discharge:  Yes,  pt will return home  At discharge, do you have transportation home?: Yes,  CSW will assist with transportation  Do you have the ability to pay for your medications: Yes,  [employer] MEDICARE / DREMA DUAL COMPLETE  Release of information consent forms completed and in the chart;  Patient's signature needed at discharge.  Patient to Follow up at:  Follow-up Information     [facility] Follow up on 12/05/2023.   Why: Brent [name] scheduled for 12/05/23 at 8am and it will be a virtual appointment. Contact information: [address]  [address] [place] [address] [phone]                 Next level of care provider has access to [facility] Link:no  Safety Planning and Suicide Prevention discussed: Yes,  Brent [name], brother, [phone]     Has patient been referred to the Quitline?: Patient refused referral for treatment  Patient has been referred for addiction treatment: No known substance use disorder.  [address] LCSWA 11/27/2023, 11:01 AM

## 2023-11-27 NOTE — Plan of Care (Signed)
 ?  Problem: Education: ?Goal: Knowledge of the prescribed therapeutic regimen will improve ?Outcome: Progressing ?  ?Problem: Coping: ?Goal: Coping ability will improve ?Outcome: Progressing ?Goal: Will verbalize feelings ?Outcome: Progressing ?  ?

## 2023-11-27 NOTE — Progress Notes (Signed)
   11/27/23 1140  Spiritual Encounters  Type of Visit Initial  Care provided to: Patient  Referral source Chaplain assessment  Reason for visit Routine spiritual support   I was able to provided spiritual care support to Mr Brent [name] just prior to time of discharge.  [name] was seated on his bed, he welcomed my visit. [name] reflected on hopes and goals for healing, esp gaining weight and recovering energy levels. He named sources of talent and life-giving activity, writing lyrics and music which have also been ways of collaborating with others. Another significant connection is with 16yo god-daughter and mutually supportive relationship with her. He also named significant losses and possible connections of life experiences (perhaps loss) and physical illness.  I provided non-anxious, compassionate presence. I celebrated with [name] his many gifts and offered support to the positive goals he named. I invited reflection around locating purpose in life and goals that help him feel empowered, motivated. I encouraged holding open possibilities for healing, new creativity, sources of strength; meaning making.  Krystianna Soth L. Delores HERO.Div

## 2023-11-27 NOTE — Progress Notes (Signed)
 Discharge Note:  Patient denies SI/HI/AVH at this time. Discharge instructions, AVS, prescriptions, and transition record reviewed with patient. Patient agrees to comply with medication management, follow-up visit, and outpatient therapy. Patient belongings returned to patient. Patient questions and concerns addressed and answered. Patient ambulatory off unit. Patient discharged to home via cab voucher.  Pt declined to fill out suicide safety plan.

## 2023-12-03 ENCOUNTER — Encounter (HOSPITAL_COMMUNITY): Payer: Self-pay

## 2023-12-03 ENCOUNTER — Other Ambulatory Visit: Payer: Self-pay

## 2023-12-03 ENCOUNTER — Observation Stay (HOSPITAL_COMMUNITY)
Admission: EM | Admit: 2023-12-03 | Discharge: 2023-12-06 | Disposition: A | Discharge status: Other Acute Inpatient Hospital | Attending: Emergency Medicine | Admitting: Emergency Medicine

## 2023-12-03 DIAGNOSIS — Z934 Other artificial openings of gastrointestinal tract status: Secondary | ICD-10-CM | POA: Diagnosis not present

## 2023-12-03 DIAGNOSIS — J449 Chronic obstructive pulmonary disease, unspecified: Secondary | ICD-10-CM | POA: Insufficient documentation

## 2023-12-03 DIAGNOSIS — R109 Unspecified abdominal pain: Secondary | ICD-10-CM | POA: Diagnosis present

## 2023-12-03 DIAGNOSIS — F17210 Nicotine dependence, cigarettes, uncomplicated: Secondary | ICD-10-CM | POA: Diagnosis not present

## 2023-12-03 DIAGNOSIS — R45851 Suicidal ideations: Secondary | ICD-10-CM | POA: Insufficient documentation

## 2023-12-03 DIAGNOSIS — I10 Essential (primary) hypertension: Secondary | ICD-10-CM | POA: Diagnosis not present

## 2023-12-03 DIAGNOSIS — K859 Acute pancreatitis without necrosis or infection, unspecified: Secondary | ICD-10-CM | POA: Diagnosis not present

## 2023-12-03 DIAGNOSIS — F419 Anxiety disorder, unspecified: Secondary | ICD-10-CM | POA: Diagnosis present

## 2023-12-03 DIAGNOSIS — I700 Atherosclerosis of aorta: Secondary | ICD-10-CM | POA: Insufficient documentation

## 2023-12-03 DIAGNOSIS — F332 Major depressive disorder, recurrent severe without psychotic features: Principal | ICD-10-CM | POA: Diagnosis present

## 2023-12-03 DIAGNOSIS — K861 Other chronic pancreatitis: Secondary | ICD-10-CM | POA: Diagnosis not present

## 2023-12-03 DIAGNOSIS — E871 Hypo-osmolality and hyponatremia: Principal | ICD-10-CM | POA: Diagnosis present

## 2023-12-03 LAB — CBC WITH DIFFERENTIAL/PLATELET
Abs Immature Granulocytes: 0.03 K/uL (ref 0.00–0.07)
Basophils Absolute: 0.1 K/uL (ref 0.0–0.1)
Basophils Relative: 1 %
Eosinophils Absolute: 0.1 K/uL (ref 0.0–0.5)
Eosinophils Relative: 2 %
HCT: 34.9 % — ABNORMAL LOW (ref 39.0–52.0)
Hemoglobin: 12.5 g/dL — ABNORMAL LOW (ref 13.0–17.0)
Immature Granulocytes: 0 %
Lymphocytes Relative: 18 %
Lymphs Abs: 1.3 K/uL (ref 0.7–4.0)
MCH: 31.7 pg (ref 26.0–34.0)
MCHC: 35.8 g/dL (ref 30.0–36.0)
MCV: 88.6 fL (ref 80.0–100.0)
Monocytes Absolute: 0.8 K/uL (ref 0.1–1.0)
Monocytes Relative: 11 %
Neutro Abs: 4.9 K/uL (ref 1.7–7.7)
Neutrophils Relative %: 68 %
Platelets: 328 K/uL (ref 150–400)
RBC: 3.94 MIL/uL — ABNORMAL LOW (ref 4.22–5.81)
RDW: 13.1 % (ref 11.5–15.5)
WBC: 7.3 K/uL (ref 4.0–10.5)
nRBC: 0 % (ref 0.0–0.2)

## 2023-12-03 LAB — LIPASE, BLOOD: Lipase: 77 U/L — ABNORMAL HIGH (ref 11–51)

## 2023-12-03 LAB — COMPREHENSIVE METABOLIC PANEL WITH GFR
ALT: 11 U/L (ref 0–44)
AST: 17 U/L (ref 15–41)
Albumin: 4 g/dL (ref 3.5–5.0)
Alkaline Phosphatase: 74 U/L (ref 38–126)
Anion gap: 10 (ref 5–15)
BUN: 5 mg/dL — ABNORMAL LOW (ref 6–20)
CO2: 25 mmol/L (ref 22–32)
Calcium: 8.9 mg/dL (ref 8.9–10.3)
Chloride: 90 mmol/L — ABNORMAL LOW (ref 98–111)
Creatinine, Ser: 0.64 mg/dL (ref 0.61–1.24)
GFR, Estimated: >60 mL/min (ref >60)
Glucose, Bld: 89 mg/dL (ref 70–99)
Potassium: 3.4 mmol/L — ABNORMAL LOW (ref 3.5–5.1)
Sodium: 125 mmol/L — ABNORMAL LOW (ref 135–145)
Total Bilirubin: 0.4 mg/dL (ref 0.0–1.2)
Total Protein: 6.1 g/dL — ABNORMAL LOW (ref 6.5–8.1)

## 2023-12-03 LAB — OSMOLALITY: Osmolality: 264 mosm/kg — ABNORMAL LOW (ref 275–295)

## 2023-12-03 LAB — TSH: TSH: 0.852 u[IU]/mL (ref 0.350–4.500)

## 2023-12-03 MED ORDER — SIMETHICONE 80 MG PO CHEW
80.0000 mg | CHEWABLE_TABLET | Freq: Four times a day (QID) | ORAL | Status: DC | PRN
  Administered 2023-12-04: 80 mg via ORAL
  Filled 2023-12-03: qty 1

## 2023-12-03 MED ORDER — ACETAMINOPHEN 325 MG PO TABS
650.0000 mg | ORAL_TABLET | Freq: Four times a day (QID) | ORAL | Status: DC | PRN
  Administered 2023-12-05 – 2023-12-06 (×4): 650 mg via ORAL
  Filled 2023-12-03 (×4): qty 2

## 2023-12-03 MED ORDER — ONDANSETRON HCL 4 MG PO TABS: 4.0000 mg | ORAL_TABLET | Freq: Three times a day (TID) | ORAL | Status: DC | PRN

## 2023-12-03 MED ORDER — ACETAMINOPHEN 650 MG RE SUPP: 650.0000 mg | Freq: Four times a day (QID) | RECTAL | Status: DC | PRN

## 2023-12-03 MED ORDER — PANCRELIPASE (LIP-PROT-AMYL) 36000-114000 UNITS PO CPEP
72000.0000 [IU] | ORAL_CAPSULE | Freq: Three times a day (TID) | ORAL | Status: DC
  Administered 2023-12-03 – 2023-12-06 (×9): 72000 [IU] via ORAL
  Filled 2023-12-03 (×11): qty 2

## 2023-12-03 MED ORDER — KETOROLAC TROMETHAMINE 0.5 % OP SOLN
2.0000 [drp] | Freq: Four times a day (QID) | OPHTHALMIC | Status: DC
  Administered 2023-12-03 – 2023-12-06 (×11): 2 [drp] via OPHTHALMIC
  Filled 2023-12-03: qty 3

## 2023-12-03 MED ORDER — SODIUM CHLORIDE 0.9 % IV SOLN: INTRAVENOUS | Status: AC

## 2023-12-03 MED ORDER — PANCRELIPASE (LIP-PROT-AMYL) 36000-114000 UNITS PO CPEP
36000.0000 [IU] | ORAL_CAPSULE | ORAL | Status: DC
  Administered 2023-12-03 – 2023-12-05 (×4): 36000 [IU] via ORAL
  Filled 2023-12-03 (×2): qty 1

## 2023-12-03 MED ORDER — ALPRAZOLAM 1 MG PO TABS
1.0000 mg | ORAL_TABLET | Freq: Three times a day (TID) | ORAL | Status: DC | PRN
Start: 2023-12-03 — End: 2023-12-06
  Administered 2023-12-03 – 2023-12-06 (×10): 1 mg via ORAL
  Filled 2023-12-03 (×10): qty 1

## 2023-12-03 MED ORDER — FLUOXETINE HCL 10 MG PO CAPS
10.0000 mg | ORAL_CAPSULE | Freq: Every day | ORAL | Status: DC
  Administered 2023-12-03 – 2023-12-06 (×4): 10 mg via ORAL
  Filled 2023-12-03 (×4): qty 1

## 2023-12-03 MED ORDER — NICOTINE 14 MG/24HR TD PT24
14.0000 mg | MEDICATED_PATCH | Freq: Every day | TRANSDERMAL | Status: DC
  Administered 2023-12-03 – 2023-12-06 (×4): 14 mg via TRANSDERMAL
  Filled 2023-12-03 (×4): qty 1

## 2023-12-03 MED ORDER — ONDANSETRON HCL 4 MG/2ML IJ SOLN: 4.0000 mg | Freq: Four times a day (QID) | INTRAMUSCULAR | Status: DC | PRN

## 2023-12-03 MED ORDER — ENOXAPARIN SODIUM 40 MG/0.4ML IJ SOSY
40.0000 mg | PREFILLED_SYRINGE | INTRAMUSCULAR | Status: DC
Start: 2023-12-03 — End: 2023-12-06
  Administered 2023-12-03 – 2023-12-05 (×3): 40 mg via SUBCUTANEOUS
  Filled 2023-12-03 (×3): qty 0.4

## 2023-12-03 MED ORDER — VITAMIN D 25 MCG (1000 UNIT) PO TABS
1000.0000 [IU] | ORAL_TABLET | Freq: Every day | ORAL | Status: DC
  Administered 2023-12-03 – 2023-12-06 (×4): 1000 [IU] via ORAL
  Filled 2023-12-03 (×4): qty 1

## 2023-12-03 MED ORDER — VITAMIN B-12 1000 MCG PO TABS
2000.0000 ug | ORAL_TABLET | Freq: Every day | ORAL | Status: DC
  Administered 2023-12-03 – 2023-12-06 (×4): 2000 ug via ORAL
  Filled 2023-12-03 (×4): qty 2

## 2023-12-03 MED ORDER — ADULT MULTIVITAMIN W/MINERALS CH
1.0000 | ORAL_TABLET | Freq: Every day | ORAL | Status: DC
  Administered 2023-12-03 – 2023-12-06 (×4): 1 via ORAL
  Filled 2023-12-03 (×4): qty 1

## 2023-12-03 MED ORDER — FLUTICASONE FUROATE-VILANTEROL 100-25 MCG/ACT IN AEPB
1.0000 | INHALATION_SPRAY | Freq: Every day | RESPIRATORY_TRACT | Status: DC
  Administered 2023-12-04 – 2023-12-06 (×3): 1 via RESPIRATORY_TRACT
  Filled 2023-12-03: qty 28

## 2023-12-03 MED ORDER — DOCUSATE SODIUM 100 MG PO CAPS
100.0000 mg | ORAL_CAPSULE | Freq: Every day | ORAL | Status: DC
  Administered 2023-12-03 – 2023-12-06 (×4): 100 mg via ORAL
  Filled 2023-12-03 (×4): qty 1

## 2023-12-03 MED ORDER — ONDANSETRON HCL 4 MG PO TABS: 4.0000 mg | ORAL_TABLET | Freq: Four times a day (QID) | ORAL | Status: DC | PRN

## 2023-12-03 MED ORDER — LACTATED RINGERS IV BOLUS
1000.0000 mL | Freq: Once | INTRAVENOUS | Status: AC
  Administered 2023-12-03: 1000 mL via INTRAVENOUS

## 2023-12-03 MED ORDER — HYDROMORPHONE HCL 1 MG/ML IJ SOLN
0.5000 mg | INTRAMUSCULAR | Status: DC | PRN
  Administered 2023-12-03 – 2023-12-04 (×4): 1 mg via INTRAVENOUS
  Administered 2023-12-05: 0.5 mg via INTRAVENOUS
  Filled 2023-12-03 (×5): qty 1

## 2023-12-03 MED ORDER — KATE FARMS STANDARD 1.4 PO LIQD
325.0000 mL | Freq: Two times a day (BID) | ORAL | Status: DC
  Filled 2023-12-03 (×3): qty 325

## 2023-12-03 MED ORDER — TRAZODONE HCL 50 MG PO TABS
100.0000 mg | ORAL_TABLET | Freq: Every day | ORAL | Status: DC
  Administered 2023-12-03 – 2023-12-05 (×3): 100 mg via ORAL
  Filled 2023-12-03 (×3): qty 2

## 2023-12-03 MED ORDER — PANTOPRAZOLE SODIUM 40 MG PO TBEC
40.0000 mg | DELAYED_RELEASE_TABLET | Freq: Every day | ORAL | Status: DC
  Administered 2023-12-03 – 2023-12-06 (×4): 40 mg via ORAL
  Filled 2023-12-03 (×4): qty 1

## 2023-12-03 MED ORDER — ONDANSETRON HCL 4 MG/2ML IJ SOLN
4.0000 mg | Freq: Once | INTRAMUSCULAR | Status: AC
  Administered 2023-12-03: 4 mg via INTRAVENOUS
  Filled 2023-12-03: qty 2

## 2023-12-03 NOTE — ED Provider Notes (Signed)
 [place] EMERGENCY DEPARTMENT AT [facility] Provider Note   CSN: [id number] Arrival date & time: 12/03/23  9351     Patient presents with: Psychiatric Evaluation and Abdominal Pain   Brent [name] is a 53 y.o. male.    Abdominal Pain Patient presents acute on chronic abdominal pain.  History of chronic pancreatitis.  Has GJ tube in place.  Is on some chronic medicines.  Not on pain meds any longer.  However has been dealing with this for years.  Worse pain recently.  Recent admission to [facility] for behavioral health.  Has history of depression with these episodes.  Has suicidal thoughts.  No frank plan but states when he starts to feel these thoughts he will come in to get evaluated.  No fevers.  Does have some oral intake but states has had some nausea recently.    Past Medical History:  Diagnosis Date   Alcohol  use    Anxiety    Disabled due to panic attacks   Arthritis    Bilateral ureteral calculi    Borderline type 2 diabetes mellitus    BPH (benign prostatic hypertrophy)    Chronic back pain    Condylomata acuminata in male    multiple procedures --  penile, peri-rectal , perineum   DDD (degenerative disc disease), cervical    Depression    Dyspnea on exertion    Emphysematous COPD (HCC)    Epiretinal membrane (ERM) of both eyes    Fibromyalgia    GERD (gastroesophageal reflux disease)    Headache(784.0)    History of chronic pancreatitis    severeal adx for this /  2008  dx alcoholic pancreatitis   History of esophageal dilatation    History of hiatal hernia    History of kidney stones    History of panic attacks    History of sepsis    adx 05-01-2014--  urosepsis due to kidney stones obstruction/ hydronephrosis   History of suicidal ideation    2006   adx   Hypertension    was on medication for short time, htn was caused by prednisone  per pt   Multiple sclerosis    pt. states has 4 small brain lesions   Nephrolithiasis    bilateral     Retinal vasculitis    Uveitis    Past Surgical History:  Procedure Laterality Date   BIOPSY  11/12/2012   Procedure: GASTRIC AND ESOPHAGEAL BIOPSIES;  Surgeon: Lamar CHRISTELLA Hollingshead, MD;  Location: [facility];  Service: Endoscopy;;   CATARACT EXTRACTION W/ INTRAOCULAR LENS  IMPLANT, BILATERAL     COLONOSCOPY  10/08/2011   Jenkins:Normal colon/Anal condyloma without extension proximal to dentate line   CYSTOSCOPY W/ URETERAL STENT PLACEMENT Bilateral 05/01/2014   Procedure: CYSTOSCOPY WITH BILATERAL RETROGRADE PYELOGRAM; BILATERAL URETERAL STENT PLACEMENT;  Surgeon: Alm Fragmin, MD;  Location: [facility];  Service: Urology;  Laterality: Bilateral;   CYSTOSCOPY W/ URETERAL STENT REMOVAL Bilateral 06/06/2014   Procedure: CYSTOSCOPY WITH STENT REMOVAL;  Surgeon: Garnette Shack, MD;  Location[facility];  Service: Urology;  Laterality: Bilateral;   CYSTOSCOPY WITH URETEROSCOPY  06/06/2014   Procedure: CYSTOSCOPY WITH URETEROSCOPY;  Surgeon: Garnette Shack, MD;  Location: [facility];  Service: Urology;;   CYSTOSCOPY WITH URETEROSCOPY AND STENT PLACEMENT Bilateral 06/06/2014   Procedure: CYSTOSCOPY WITH J2 STENT EXTRACTION,,URETEROSCOPY WITH EXTRACTION OF STONES,;  Surgeon: Garnette Shack, MD;  Location: [facility];  Service: Urology;  Laterality: Bilateral;   ELECTROCAUTERY/ DESICCATION  OF CONDYLOMA LESIONS  01-15-2008  &  12-29-2009   PENIS, PERI-RECTAL , PERINUEM   ESOPHAGOGASTRODUODENOSCOPY (EGD) WITH PROPOFOL  N/A 11/12/2012   MFM:Wnmfjo esophagus s/p  passage of a Maloney dilator and biopsy. Abnormal gastric mucosa-status post biopsy   FLEXIBLE BRONCHOSCOPY N/A 08/12/2012   Procedure: FLEXIBLE BRONCHOSCOPY;  Surgeon: Dallas LITTIE Gelineau, MD;  Location: [facility];  Service: Pulmonary;  Laterality: N/A;   MALONEY DILATION N/A 11/12/2012   Procedure: MALONEY DILATION (54mm);  Surgeon: Lamar CHRISTELLA Hollingshead, MD;  Location: [facility];  Service: Endoscopy;  Laterality: N/A;    MULTIPLE EXTRACTIONS WITH ALVEOLOPLASTY N/A 04/22/2014   Procedure: MULTIPLE EXTRACTIONS ( 2,3,5,6,7,8,9,10,11,13,14,15,21,28  WITH ALVEOLOPLASTY;  Surgeon: Glendia Primrose, DDS;  Location: [facility];  Service: Oral Surgery;  Laterality: N/A;   WISDOM TOOTH EXTRACTION      Prior to Admission medications   Medication Sig Start Date End Date Taking? Authorizing Provider  ALPRAZolam  (XANAX ) 1 MG tablet Take 1 tablet (1 mg total) by mouth 3 (three) times daily as needed for up to 15 days for anxiety (reordered home medication). 11/26/23 12/11/23  Donnelly Mellow, MD  Cholecalciferol  (VITAMIN D  HIGH POTENCY) 25 MCG (1000 UT) capsule Take 1,000 Units by mouth daily.    [provider]  CREON  24000-76000 units CPEP Take 1-3 capsules by mouth See admin instructions. Take 3 capsules by mouth three times daily with each meal and take 1 capsule by mouth daily with each snack. 11/04/23   [provider]  cyanocobalamin (VITAMIN B12) 1000 MCG tablet Take 2,000 mcg by mouth daily. 11/23/21   [provider]  docusate sodium (COLACE) 100 MG capsule Take 100 mg by mouth daily. 11/23/21   [provider]  FLUoxetine (PROZAC) 20 MG/5ML solution Take 2.5 mLs (10 mg total) by mouth daily. 11/27/23   Jadapalle, Sree, MD  ketorolac  (ACULAR ) 0.5 % ophthalmic solution Place 2 drops into both eyes 4 (four) times daily.    [provider]  Multiple Vitamin (MULTIVITAMIN WITH MINERALS) TABS tablet Take 1 tablet by mouth daily. 11/27/23   Jadapalle, Sree, MD  naphazoline-glycerin (CLEAR EYES REDNESS) SOLN Place 1-2 drops into both eyes 4 (four) times daily as needed for eye irritation. 11/26/23   Jadapalle, Sree, MD  naproxen (NAPROSYN) 500 MG tablet Take 1 tablet (500 mg total) by mouth 2 (two) times daily with a meal. 11/20/23   Cleotilde Rogue, MD  nicotine  (NICODERM CQ  - DOSED IN MG/24 HOURS) 14 mg/24hr patch Place 1 patch (14 mg total) onto the skin daily. 11/27/23   Donnelly Mellow, MD   Nutritional Supplements (FEEDING SUPPLEMENT, KATE FARMS STANDARD 1.4,) LIQD liquid Take 325 mLs by mouth 2 (two) times daily between meals. 11/27/23   Jadapalle, Sree, MD  omeprazole (PRILOSEC) 40 MG capsule Take 40 mg by mouth at bedtime. 11/14/23   [provider]  ondansetron  (ZOFRAN ) 4 MG tablet Take 4 mg by mouth every 8 (eight) hours as needed for nausea.  05/28/19   [provider]  simethicone  (MYLICON) 80 MG chewable tablet Chew 1 tablet (80 mg total) by mouth every 6 (six) hours as needed for flatulence. 11/26/23   Jadapalle, Sree, MD  SYMBICORT 80-4.5 MCG/ACT inhaler Inhale 1 puff into the lungs 2 (two) times daily. 04/23/19   [provider]  traZODone (DESYREL) 100 MG tablet Take 1 tablet (100 mg total) by mouth at bedtime. 11/26/23   Jadapalle, Sree, MD    Allergies: Patient has no known allergies.    Review of Systems  Gastrointestinal:  Positive for abdominal pain.    Updated Vital Signs BP 101/74   Pulse 86   Temp 98.8 F (37.1 C) (Rectal)   Resp 16   Ht 5' 7 (1.702 m)   Wt 46.7 kg   SpO2 96%   BMI 16.13 kg/m   Physical Exam Vitals and nursing note reviewed.  Cardiovascular:     Rate and Rhythm: Normal rate.  Abdominal:     Tenderness: There is abdominal tenderness.     Hernia: No hernia is present.     Comments: Diffuse abdominal tenderness rebound or guarding.  GJ tube in left upper abdomen.  Skin:    General: Skin is warm.  Neurological:     Mental Status: He is alert.     (all labs ordered are listed, but only abnormal results are displayed) Labs Reviewed  COMPREHENSIVE METABOLIC PANEL WITH GFR - Abnormal; Notable for the following components:      Result Value   Sodium 125 (*)    Potassium 3.4 (*)    Chloride 90 (*)    BUN 5 (*)    Total Protein 6.1 (*)    All other components within normal limits  LIPASE, BLOOD - Abnormal; Notable for the following components:   Lipase 77 (*)    All other components within normal  limits  CBC WITH DIFFERENTIAL/PLATELET - Abnormal; Notable for the following components:   RBC 3.94 (*)    Hemoglobin 12.5 (*)    HCT 34.9 (*)    All other components within normal limits  URINE DRUG SCREEN    EKG: None  Radiology: No results found.   Procedures   Medications Ordered in the ED  ondansetron  (ZOFRAN ) injection 4 mg (has no administration in time range)  lactated ringers  bolus 1,000 mL (1,000 mLs Intravenous Bolus 12/03/23 0927)                                    Medical Decision Making Amount and/or Complexity of Data Reviewed Labs: ordered.  Risk Prescription drug management. Decision regarding hospitalization.   Patient with both depression/suicidal thoughts and chronic abdominal pain with worsening symptoms.  Differential diagnosis for the abdominal pain does include dehydration, pancreatitis, obstruction.  Patient feels somewhat warm and has oral temperature 99.1.  Will get rectal temperature.  Reviewed recent psychiatric discharge note.  Patient was not febrile here on the rectal temperature.  Lipase mildly elevated.  But is somewhat near baseline.  However sodium now down to 125.  Has not really had good oral intake.  States really has not been able to eat or drink much.  Also had not gotten his psych medications so his depression has worsened.  At this point I do not think he can be medically cleared for placement to psychiatric center since he is not tolerating orals and will not be able to supplement through his GJ tube at a psychiatric hospital.  Will discuss with hospitalist for admission.     Final diagnoses:  Hyponatremia  Chronic pancreatitis, unspecified pancreatitis type [facility])  Suicidal ideations    ED Discharge Orders     None          Patsey Lot, MD 12/03/23 1040

## 2023-12-03 NOTE — ED Notes (Signed)
 Pt room set up for [employer], garage door closed, items removed from room.

## 2023-12-03 NOTE — Progress Notes (Signed)
 1633 Patient would not wake up to take his Breo DPI. He appeared to be sleeping heavily, and was not in respiratory distress.

## 2023-12-03 NOTE — ED Notes (Addendum)
 Patient wet stood up and urinated in the bed, linen changed.

## 2023-12-03 NOTE — TOC CM/SW Note (Signed)
 Transition of Care [facility]) - Inpatient Brief Assessment   Patient Details  Name: LAVONTE PALOS MRN: [id number] Date of Birth: 06-Jan-1971  Transition of Care [facility]) CM/SW Contact:    Noreen KATHEE Cleotilde ISRAEL Phone Number: 12/03/2023, 11:13 AM   Clinical Narrative:  Inpatient Care Management (ICM) has reviewed patient and no other ICM needs have been identified at this time. We will continue to monitor patient advancement through interdisciplinary progression rounds. If new patient transition needs arise, please place a ICM consult.  Transition of Care Asessment: Insurance and Status: Insurance coverage has been reviewed Patient has primary care physician: No (PCP list added to AVS) Home environment has been reviewed: Apartment Prior level of function:: Independent Prior/Current Home Services: No current home services Social Drivers of Health Review: SDOH reviewed interventions complete Readmission risk has been reviewed: Yes Transition of care needs: no transition of care needs at this time

## 2023-12-03 NOTE — Discharge Instructions (Addendum)
 Providers Accepting New Patients in [place], [place]    [facility] [address]  [place], [place] [address]J [phone] Accepts most insurances  [facility] [address] [place], [place] [address] [phone] Accepts most insurances  [facility] [address] [place], [place] [address]  [phone] Must meet requirements  [facility] [address] [place], [place] [address] [phone] Accepts most insurances  [facility] [address]  [place], [place] [address] [phone] Accepts most insurances  [facility] [address]   [place], [place]   [phone] Accepts most insurances  [facility])  [phone] [address]  [place], [place] [address] [phone] Accepts most insurances     [facility] [address]  [place], [place] [address] [phone] Accepts most insurances  [facility] Department [address] [place], [place] [address] [phone] option 1 Accepts Medicaid and [facility] [address]  [place], [place] [address] [phone] Accepts most insurances  Brent Outhouse, MD [address] [place], [place] [address] [phone] Accepts most insurances  [facility] [address]  [place], [place] [address] [phone] Accepts most insurances  [facility] [phone] [place], [place] [address] [phone] Accepts most insurances  [place], [place] [address], [address] [place], [place] [address] [phone]  Accepts most insurances   Food  Agency Name: Aging Disability & Transit Services of [facility] Address: [address], [place], [place] [address] Phone: [phone] Website: www.adtsrc.org Services Offered: Meals on [employer] and Meals with Friends.   Home  care, at home assisted living, volunteer services, Center  for Active Retirement, transportation  Agency Name: [facility] Address: Sites vary. Must call first. Food Pantry location: [address] [place], [place] [address] [employer] Phone: [phone] Website: none Services Offered: Museum/gallery curator, utility assistance if funds available Careers adviser for all of [place], [employer], [place]  [employer] , Office manager and [employer] for [employer] area only) Walk-in  current Id and current address verification required.  Wed-Thurs: 9:30-12:00 Agency Name: [facility] Address: [address] [place], [place] [address]  Phone: [phone] or [phone] Website: none Services Offered: Food assistance Agency Name: [employer] Address: [address] [place], [place] [address]  Phone: [phone] or [phone]  Website: none Services Offered: Serves 1 hot meal a day at 11:00 am Monday-Sunday and 5 pm  on the second and fourth Sunday of each month Agency Name: [facility] Department of Health and [facility]  Services/Social Services  Address: [phone], [place], [place] [address] Phone: [phone] Website: [email or web address]   [email or web address]://[email or web address] Services Offered: Sales executive, [facility] program Agency Name: [facility] Address: [address] [place], [place] [address] Phone: [phone] Website: www.rockinghamhope.com Services Offered: Food pantry Tuesday, Wednesday and Thursday 9am-11:30am  (need appointment) and health clinic (9:00am-11:00am)  Agency Name: [employer] [facility] Address: [address] [place] / [address] [place] Phone: [phone] [place] / [phone] [place] Website: OpinionTrades.tn NetworkAffair.co.za Services Offered: Civil Service fast streamer, food pantry, soup kitchen Psychologist, counselling) emergency financial  assistance, thrift stores, showers & hygiene  products ([place]),  Christmas Assistance, spiritual help  Rent/Utilities/Housing  Agency: [employer] Address: [address] [address] [place], [place] [address]  [address] [place], [place] [address]  Phone Number:  [phone] or [phone] For the hearing-impaired - Dial 711 for [employer] Agency Name: Raynaldo Haws. Dept. of Health and Human Services Address: [phone], [place], [place] [address] Phone: [phone] Website: www.co.[place]ingham..us  Services Offered: Temporary financial assistance, subsidized housing, and utility  assistance  Agency Name: [facility]  Address: [address], [place], [place] [address] Phone: [phone] ext. [phone] Email: Contact: info@newrha .org Website: FootballPromos.co.nz Services Offered: Subsidized apartment rent based on income.  Agency Name: [facility] Address: [facility], [address] [place], [place] Phone: [phone]  Website: www.ccmeden.org Services Offered: Museum/gallery curator, utility assistance Technical brewer for all of  [facility], [employer], [place] [employer] , [employer]  January 18, 2020 13 and [place] for [place] area only), rent assistance.  Agency Name: [facility] Address: [address] [place], [place], [place] [address] / [name],  [place] Phone: [phone] [place] / [phone] [place] Website: OpinionTrades.tn NetworkAffair.co.za Services Offered: Civil Service fast streamer, food, showers, hygiene products utility payment  assistance, thrift shops, rental assistance, Support Groups Agency Name: [facility]  Address: [address] [place], [place] [address]  Phone: [phone] / [phone] Website: https://www.daymarkrecovery.org/ Services Offered: Support groups for Bipolar, substance abuse, anger  management, panic depression and anxiety. Mobile crisis unit,  outpatient therapy,  substance abuse treatment. Agency Name: [employer]  Address: [address], [place], [place] [address]  Phone: [phone] Website: www.helpinc-centeragainstviolence.org Services Offered: Support groups for domestic violence or sexual assault, support  group for elderly women and domestic assault.  Transportation  Agency Name: Aging Disability & Transit Services of [place] Address: [address], [place], [place] [address] Phone: [phone] Website: www.adtsrc.org Services Offered: Meals on [employer]. Home care, at home assisted  living, volunteer services, Center for Active Retirement,  RCATS and SKAT transportation system.  [phone] please call and dial ex [phone] ( in county ) [phone] Agency Name: [facility] Address: [address] [place], [place] [address] Phone: [phone] Website: www.pel[place]transportation.com Services Offered: Transportation for a fee.

## 2023-12-03 NOTE — ED Notes (Signed)
 ED Provider at bedside.

## 2023-12-03 NOTE — ED Triage Notes (Signed)
 Patient BIB [facility] from home for complaint of LLQ abdominal pain and troubling thoughts.  Was seen 8 months ago for the same abdominal pain. Stated has had thoughts of hurting self but no plan at this time. Abdominal pain 8/10 stated it started up Sunday night.

## 2023-12-03 NOTE — ED Notes (Signed)
 Portable called for tele sitter.

## 2023-12-03 NOTE — ED Triage Notes (Incomplete)
 Patient BIB [facility], for complaints of

## 2023-12-03 NOTE — ED Notes (Signed)
 Pt dressed out into BH scrubs, pt belongings placed into bag (t shirt, sweatpants, overalls, jacket, boots) labeled with pt specific sticker and locked in pt locker number 9.   Pt informed that staff will need urine sample, stated unable to void at this time.

## 2023-12-03 NOTE — Plan of Care (Signed)

## 2023-12-03 NOTE — H&P (Signed)
 History and Physical    Brent [name] FMW:[id number] DOB: [birth year] DOA: 12/03/2023  PCP: Pcp, No   Patient coming from: Home  Chief Complaint: Suicidal ideation with abdominal pain  HPI: Brent [name] is a 53 y.o. 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  He was started on liquid Prozac and appeared to be tolerating this well and was otherwise stable for discharge at that time, however it appears that he was not able to obtain this medication.  Additionally, he has some suicidal thoughts and wanted to be evaluated.  He has overall had some poor oral intake and complains of nausea but no vomiting.  He denies any diarrhea, fevers, or chills.   ED Course: Vital signs stable and patient afebrile.  Sodium noted to be 125 with potassium 3.4 and lipase 77.  Patient has voiced suicidal ideation without any frank plans and was given 1 L fluid bolus.  Review of Systems: Reviewed as noted above, otherwise negative.  Past Medical History:  Diagnosis Date   Alcohol  use    Anxiety    Disabled due to panic attacks   Arthritis    Bilateral ureteral calculi    Borderline type 2 diabetes mellitus    BPH (benign prostatic hypertrophy)    Chronic back pain    Condylomata acuminata in male    multiple procedures --  penile, peri-rectal , perineum   DDD (degenerative disc disease), cervical    Depression    Dyspnea on exertion    Emphysematous COPD (HCC)    Epiretinal membrane (ERM) of both eyes    Fibromyalgia    GERD (gastroesophageal reflux disease)    Headache(784.0)    History of chronic pancreatitis    severeal adx for this /  2008  dx alcoholic pancreatitis   History of esophageal dilatation    History of hiatal hernia    History of kidney stones    History of panic attacks    History of sepsis    adx  05-01-2014--  urosepsis due to kidney stones obstruction/ hydronephrosis   History of suicidal ideation    2006   adx   Hypertension    was on medication for short time, htn was caused by prednisone  per pt   Multiple sclerosis    pt. states has 4 small brain lesions   Nephrolithiasis    bilateral    Retinal vasculitis    Uveitis     Past Surgical History:  Procedure Laterality Date   BIOPSY  11/12/2012   Procedure: GASTRIC AND ESOPHAGEAL BIOPSIES;  Surgeon: Lamar CHRISTELLA Hollingshead, MD;  Location: [facility];  Service: Endoscopy;;   CATARACT EXTRACTION W/ INTRAOCULAR LENS  IMPLANT, BILATERAL     COLONOSCOPY  10/08/2011   Jenkins:Normal colon/Anal condyloma without extension proximal to dentate line   CYSTOSCOPY W/ URETERAL STENT PLACEMENT Bilateral 05/01/2014   Procedure: CYSTOSCOPY WITH BILATERAL RETROGRADE PYELOGRAM; BILATERAL URETERAL STENT PLACEMENT;  Surgeon: Alm Fragmin, MD;  Location: [facility];  Service: Urology;  Laterality: Bilateral;   CYSTOSCOPY W/ URETERAL STENT REMOVAL Bilateral 06/06/2014   Procedure: CYSTOSCOPY WITH STENT REMOVAL;  Surgeon: Garnette Shack, MD;  Location: [facility];  Service: Urology;  Laterality: Bilateral;   CYSTOSCOPY WITH URETEROSCOPY  06/06/2014   Procedure: CYSTOSCOPY WITH URETEROSCOPY;  Surgeon: Garnette Shack, MD;  Location: [facility];  Service: Urology;;   CYSTOSCOPY WITH  URETEROSCOPY AND STENT PLACEMENT Bilateral 06/06/2014   Procedure: CYSTOSCOPY WITH J2 STENT EXTRACTION,,URETEROSCOPY WITH EXTRACTION OF STONES,;  Surgeon: Garnette Shack, MD;  Location: [facility];  Service: Urology;  Laterality: Bilateral;   ELECTROCAUTERY/ DESICCATION OF CONDYLOMA LESIONS  01-15-2008  &  12-29-2009   PENIS, PERI-RECTAL , PERINUEM   ESOPHAGOGASTRODUODENOSCOPY (EGD) WITH PROPOFOL  N/A 11/12/2012   MFM:Wnmfjo esophagus s/p  passage of a Maloney dilator and biopsy. Abnormal gastric mucosa-status post biopsy   FLEXIBLE  BRONCHOSCOPY N/A 08/12/2012   Procedure: FLEXIBLE BRONCHOSCOPY;  Surgeon: Dallas LITTIE Gelineau, MD;  Location: [facility];  Service: Pulmonary;  Laterality: N/A;   MALONEY DILATION N/A 11/12/2012   Procedure: MALONEY DILATION (54mm);  Surgeon: Lamar CHRISTELLA Hollingshead, MD;  Location: [facility];  Service: Endoscopy;  Laterality: N/A;   MULTIPLE EXTRACTIONS WITH ALVEOLOPLASTY N/A 04/22/2014   Procedure: MULTIPLE EXTRACTIONS ( 2,3,5,6,7,8,9,10,11,13,14,15,21,28  WITH ALVEOLOPLASTY;  Surgeon: Glendia Primrose, DDS;  Location: [facility];  Service: Oral Surgery;  Laterality: N/A;   WISDOM TOOTH EXTRACTION       reports that he has been smoking cigarettes and cigars. He started smoking about 21 years ago. He has a 21.7 pack-year smoking history. He has never used smokeless tobacco. He reports that he does not currently use alcohol . He reports that he does not use drugs.  No Known Allergies  Family History  Problem Relation Age of Onset   Hypertension Mother    Breast cancer Mother    Melanoma Mother    Stroke Mother    Lung cancer Father 26   Colon cancer Neg Hx    Colitis Neg Hx    Cirrhosis Neg Hx    Liver disease Neg Hx    Pancreatic cancer Neg Hx    Pancreatitis Neg Hx    Anesthesia problems Neg Hx    Hypotension Neg Hx    Malignant hyperthermia Neg Hx    Pseudochol deficiency Neg Hx     Prior to Admission medications   Medication Sig Start Date End Date Taking? Authorizing Provider  ALPRAZolam  (XANAX ) 1 MG tablet Take 1 tablet (1 mg total) by mouth 3 (three) times daily as needed for up to 15 days for anxiety (reordered home medication). 11/26/23 12/11/23  Jadapalle, Sree, MD  Cholecalciferol  (VITAMIN D  HIGH POTENCY) 25 MCG (1000 UT) capsule Take 1,000 Units by mouth daily.    [provider]  CREON  24000-76000 units CPEP Take 1-3 capsules by mouth See admin instructions. Take 3 capsules by mouth three times daily with each meal and take 1 capsule by mouth daily with each snack. 11/04/23   [provider]  cyanocobalamin (VITAMIN B12) 1000 MCG tablet Take 2,000 mcg by mouth daily. 11/23/21   [provider]  docusate sodium (COLACE) 100 MG capsule Take 100 mg by mouth daily. 11/23/21   [provider]  FLUoxetine (PROZAC) 20 MG/5ML solution Take 2.5 mLs (10 mg total) by mouth daily. 11/27/23   Donnelly Mellow, MD  ketorolac  (ACULAR ) 0.5 % ophthalmic solution Place 2 drops into both eyes 4 (four) times daily.    [provider]  Multiple Vitamin (MULTIVITAMIN WITH MINERALS) TABS tablet Take 1 tablet by mouth daily. 11/27/23   Jadapalle, Sree, MD  naphazoline-glycerin (CLEAR EYES REDNESS) SOLN Place 1-2 drops into both eyes 4 (four) times daily as needed for eye irritation. 11/26/23   Jadapalle, Sree, MD  naproxen (NAPROSYN) 500 MG tablet Take 1 tablet (500 mg total) by mouth 2 (two) times daily with a  meal. 11/20/23   Cleotilde Rogue, MD  nicotine  (NICODERM CQ  - DOSED IN MG/24 HOURS) 14 mg/24hr patch Place 1 patch (14 mg total) onto the skin daily. 11/27/23   Donnelly Mellow, MD  Nutritional Supplements (FEEDING SUPPLEMENT, KATE FARMS STANDARD 1.4,) LIQD liquid Take 325 mLs by mouth 2 (two) times daily between meals. 11/27/23   Jadapalle, Sree, MD  omeprazole (PRILOSEC) 40 MG capsule Take 40 mg by mouth at bedtime. 11/14/23   [provider]  ondansetron  (ZOFRAN ) 4 MG tablet Take 4 mg by mouth every 8 (eight) hours as needed for nausea.  05/28/19   [provider]  simethicone  (MYLICON) 80 MG chewable tablet Chew 1 tablet (80 mg total) by mouth every 6 (six) hours as needed for flatulence. 11/26/23   Jadapalle, Sree, MD  SYMBICORT 80-4.5 MCG/ACT inhaler Inhale 1 puff into the lungs 2 (two) times daily. 04/23/19   [provider]  traZODone (DESYREL) 100 MG tablet Take 1 tablet (100 mg total) by mouth at bedtime. 11/26/23   Donnelly Mellow, MD    Physical Exam: Vitals:   12/03/23 9340 12/03/23 0702 12/03/23 0705 12/03/23 0848  BP:   101/74 101/74   Pulse:  84 86   Resp:  16    Temp:  99.1 F (37.3 C)  98.8 F (37.1 C)  TempSrc:  Oral  Rectal  SpO2:  96% 96%   Weight: 46.7 kg     Height: 5' 7 (1.702 m)       Constitutional: NAD, calm, comfortable, frail and emaciated. Vitals:   12/03/23 0659 12/03/23 0702 12/03/23 0705 12/03/23 0848  BP:  101/74 101/74   Pulse:  84 86   Resp:  16    Temp:  99.1 F (37.3 C)  98.8 F (37.1 C)  TempSrc:  Oral  Rectal  SpO2:  96% 96%   Weight: 46.7 kg     Height: 5' 7 (1.702 m)      Eyes: lids and conjunctivae normal Neck: normal, supple Respiratory: clear to auscultation bilaterally. Normal respiratory effort. No accessory muscle use.  Cardiovascular: Regular rate and rhythm, no murmurs. Abdomen: no tenderness, no distention. Bowel sounds positive.  G-tube present, C/D/I Musculoskeletal:  No edema. Skin: no rashes, lesions, ulcers.  Psychiatric: Flat affect  Labs on Admission: I have personally reviewed following labs and imaging studies  CBC: Recent Labs  Lab 12/03/23 0900  WBC 7.3  NEUTROABS 4.9  HGB 12.5*  HCT 34.9*  MCV 88.6  PLT 328   Basic Metabolic Panel: Recent Labs  Lab 12/03/23 0900  NA 125*  K 3.4*  CL 90*  CO2 25  GLUCOSE 89  BUN 5*  CREATININE 0.64  CALCIUM  8.9   GFR: Estimated Creatinine Clearance: 68.9 mL/min (by C-G formula based on SCr of 0.64 mg/dL). Liver Function Tests: Recent Labs  Lab 12/03/23 0900  AST 17  ALT 11  ALKPHOS 74  BILITOT 0.4  PROT 6.1*  ALBUMIN 4.0   Recent Labs  Lab 12/03/23 0900  LIPASE 77*   No results for input(s): AMMONIA in the last 168 hours. Coagulation Profile: No results for input(s): INR, PROTIME in the last 168 hours. Cardiac Enzymes: No results for input(s): CKTOTAL, CKMB, CKMBINDEX, TROPONINI in the last 168 hours. BNP (last 3 results) No results for input(s): PROBNP in the last 8760 hours. HbA1C: No results for input(s): HGBA1C in the last 72 hours. CBG: No  results for input(s): GLUCAP in the last 168 hours. Lipid Profile: No results for  input(s): CHOL, HDL, LDLCALC, TRIG, CHOLHDL, LDLDIRECT in the last 72 hours. Thyroid  Function Tests: Recent Labs    12/03/23 0900  TSH 0.852   Anemia Panel: No results for input(s): VITAMINB12, FOLATE, FERRITIN, TIBC, IRON, RETICCTPCT in the last 72 hours. Urine analysis:    Component Value Date/Time   COLORURINE YELLOW 11/20/2023 1842   APPEARANCEUR CLEAR 11/20/2023 1842   LABSPEC 1.010 11/20/2023 1842   PHURINE 5.0 11/20/2023 1842   GLUCOSEU NEGATIVE 11/20/2023 1842   HGBUR NEGATIVE 11/20/2023 1842   BILIRUBINUR NEGATIVE 11/20/2023 1842   KETONESUR 5 (A) 11/20/2023 1842   PROTEINUR NEGATIVE 11/20/2023 1842   UROBILINOGEN 0.2 11/06/2014 2345   NITRITE NEGATIVE 11/20/2023 1842   LEUKOCYTESUR NEGATIVE 11/20/2023 1842    Radiological Exams on Admission: No results found.   Assessment/Plan Principal Problem:   Acute hyponatremia Active Problems:   Acute on chronic pancreatitis (HCC)   Anxiety   MDD (major depressive disorder), recurrent severe, without psychosis (HCC)    Acute hyponatremia likely in the setting of poor oral intake with chronic pancreatitis - Further workup with urine and serum osmolarity, TSH, and urine sodium - Likely due to poor oral intake in the setting of chronic pancreatitis - IV fluid hydration with normal saline - Clear liquid diet - Antiemetics - Pain medications as needed  Mild hypokalemia - Replete orally and recheck  Suicidal ideation with MDD - Patient has missed 2 doses of liquid Prozac as he was not able to receive this - Sitter - Plan for TTS evaluation once medically cleared  Questionable multiple sclerosis - Holding off on disease modifying drugs as this was unclear  Anxiety disorder/depression - Continue medications as prior   DVT prophylaxis: Lovenox  Code Status: Full Family Communication: None at  bedside Disposition Plan: Admit for evaluation and treatment of hyponatremia Consults called: Eventual need for psychiatric evaluation once medically cleared Admission status: Observation, MedSurg  Severity of Illness: The appropriate patient status for this patient is OBSERVATION. Observation status is judged to be reasonable and necessary in order to provide the required intensity of service to ensure the patient's safety. The patient's presenting symptoms, physical exam findings, and initial radiographic and laboratory data in the context of their medical condition is felt to place them at decreased risk for further clinical deterioration. Furthermore, it is anticipated that the patient will be medically stable for discharge from the hospital within 2 midnights of admission.    Brent Chipps D Maree DO [facility]  If 7PM-7AM, please contact night-coverage [email or web address]  12/03/2023, 1:34 PM

## 2023-12-04 DIAGNOSIS — F332 Major depressive disorder, recurrent severe without psychotic features: Secondary | ICD-10-CM | POA: Diagnosis not present

## 2023-12-04 DIAGNOSIS — E871 Hypo-osmolality and hyponatremia: Secondary | ICD-10-CM | POA: Diagnosis not present

## 2023-12-04 LAB — COMPREHENSIVE METABOLIC PANEL WITH GFR
ALT: 11 U/L (ref 0–44)
AST: 16 U/L (ref 15–41)
Albumin: 3.8 g/dL (ref 3.5–5.0)
Alkaline Phosphatase: 68 U/L (ref 38–126)
Anion gap: 8 (ref 5–15)
BUN: <5 mg/dL — ABNORMAL LOW (ref 6–20)
CO2: 28 mmol/L (ref 22–32)
Calcium: 8.7 mg/dL — ABNORMAL LOW (ref 8.9–10.3)
Chloride: 98 mmol/L (ref 98–111)
Creatinine, Ser: 0.62 mg/dL (ref 0.61–1.24)
GFR, Estimated: >60 mL/min (ref >60)
Glucose, Bld: 75 mg/dL (ref 70–99)
Potassium: 3.4 mmol/L — ABNORMAL LOW (ref 3.5–5.1)
Sodium: 134 mmol/L — ABNORMAL LOW (ref 135–145)
Total Bilirubin: 0.3 mg/dL (ref 0.0–1.2)
Total Protein: 5.9 g/dL — ABNORMAL LOW (ref 6.5–8.1)

## 2023-12-04 LAB — SODIUM, URINE, RANDOM: Sodium, Ur: <30 mmol/L

## 2023-12-04 LAB — MAGNESIUM: Magnesium: 2 mg/dL (ref 1.7–2.4)

## 2023-12-04 LAB — CBC
HCT: 38.7 % — ABNORMAL LOW (ref 39.0–52.0)
Hemoglobin: 13 g/dL (ref 13.0–17.0)
MCH: 31.2 pg (ref 26.0–34.0)
MCHC: 33.6 g/dL (ref 30.0–36.0)
MCV: 92.8 fL (ref 80.0–100.0)
Platelets: 316 K/uL (ref 150–400)
RBC: 4.17 MIL/uL — ABNORMAL LOW (ref 4.22–5.81)
RDW: 13.3 % (ref 11.5–15.5)
WBC: 3.8 K/uL — ABNORMAL LOW (ref 4.0–10.5)
nRBC: 0 % (ref 0.0–0.2)

## 2023-12-04 LAB — GLUCOSE, CAPILLARY
Glucose-Capillary: 129 mg/dL — ABNORMAL HIGH (ref 70–99)
Glucose-Capillary: 139 mg/dL — ABNORMAL HIGH (ref 70–99)

## 2023-12-04 LAB — OSMOLALITY, URINE: Osmolality, Ur: 67 mosm/kg — ABNORMAL LOW (ref 300–900)

## 2023-12-04 LAB — HIV ANTIBODY (ROUTINE TESTING W REFLEX): HIV Screen 4th Generation wRfx: NONREACTIVE

## 2023-12-04 MED ORDER — POLYVINYL ALCOHOL 1.4 % OP SOLN
1.0000 [drp] | OPHTHALMIC | Status: DC | PRN
  Administered 2023-12-04 – 2023-12-06 (×5): 1 [drp] via OPHTHALMIC
  Filled 2023-12-04: qty 15

## 2023-12-04 MED ORDER — THIAMINE MONONITRATE 100 MG PO TABS
100.0000 mg | ORAL_TABLET | Freq: Every day | ORAL | Status: DC
  Administered 2023-12-04 – 2023-12-06 (×3): 100 mg via ORAL
  Filled 2023-12-04 (×3): qty 1

## 2023-12-04 MED ORDER — KATE FARMS STANDARD 1.4 PO LIQD: 1300.0000 mL | Freq: Every day | ORAL | Status: DC

## 2023-12-04 NOTE — Progress Notes (Signed)
 PROGRESS NOTE    Brent [name]  FMW:[id number] DOB: Jun 27, 1970 DOA: 12/03/2023 PCP: Brent [name]   Brief Narrative:    Brent [name] is a 53 y.o. 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 He was admitted for acute hyponatremia likely in the setting of chronic pancreatitis and also for evaluation for suicidal ideations.  Assessment & Plan:   Principal Problem:   Acute hyponatremia Active Problems:   Acute on chronic pancreatitis (HCC)   Anxiety   MDD (major depressive disorder), recurrent severe, without psychosis (HCC)  Assessment and Plan:  Acute hyponatremia likely in the setting of poor oral intake with chronic pancreatitis-improved - Further workup with urine and serum osmolarity, TSH, and urine sodium - Likely due to poor oral intake in the setting of chronic pancreatitis - IV fluid hydration with normal saline - Regular diet - Antiemetics - Pain medications as needed   Mild hypokalemia - Replete orally and recheck   Suicidal ideation with MDD - Patient has missed 2 doses of liquid Prozac as he was not able to receive this - Sitter - Plan for TTS evaluation today   Questionable multiple sclerosis - Holding off on disease modifying drugs as this was unclear  Mild hypotension -Asymptomatic -Likely exacerbated from Dilaudid  and Xanax  earlier -Frail and likely has soft bp chronically   Anxiety disorder/depression - Continue medications as prior    DVT prophylaxis:Lovenox  Code Status: Full Family Communication: None Disposition Plan:  Status is: Observation The patient remains OBS appropriate and will d/c before 2 midnights.   Consultants:  Psychiatry  Procedures:  None  Antimicrobials:  None   Subjective: Patient seen and evaluated today with [name] new acute complaints or  concerns. [name] acute concerns or events noted overnight.  Objective: Vitals:   12/03/23 2359 12/04/23 0356 12/04/23 0846 12/04/23 1328  BP: (!) 89/61 99/72  (!) 88/66  Pulse: 81 77 77 78  Resp: 18 17 18 17   Temp: 98.7 F (37.1 C) 97.7 F (36.5 C)  97.9 F (36.6 C)  TempSrc: Oral Oral    SpO2: 95% 97% 97% 98%  Weight:      Height:        Intake/Output Summary (Last 24 hours) at 12/04/2023 1419 Last data filed at 12/04/2023 1309 Gross per 24 hour  Intake 698.75 ml  Output 2650 ml  Net -1951.25 ml   Filed Weights   12/03/23 0659 12/03/23 1459  Weight: 46.7 kg 45.4 kg    Examination:  General exam: Appears calm and comfortable  Respiratory system: Clear to auscultation. Respiratory effort normal. Cardiovascular system: S1 & S2 heard, RRR.  Gastrointestinal system: Abdomen is soft Central nervous system: Alert and awake Extremities: [name] edema Skin: [name] significant lesions noted Psychiatry: Flat affect.    Data Reviewed: I have personally reviewed following labs and imaging studies  CBC: Recent Labs  Lab 12/03/23 0900 12/04/23 0407  WBC 7.3 3.8*  NEUTROABS 4.9  --   HGB 12.5* 13.0  HCT 34.9* 38.7*  MCV 88.6 92.8  PLT 328 316   Basic Metabolic Panel: Recent Labs  Lab 12/03/23 0900 12/04/23 0407  NA 125* 134*  K 3.4* 3.4*  CL 90* 98  CO2 25 28  GLUCOSE 89 75  BUN 5* <5*  CREATININE 0.64 0.62  CALCIUM  8.9 8.7*  MG  --  2.0   GFR: Estimated Creatinine Clearance: 67 mL/min (  by C-G formula based on SCr of 0.62 mg/dL). Liver Function Tests: Recent Labs  Lab 12/03/23 0900 12/04/23 0407  AST 17 16  ALT 11 11  ALKPHOS 74 68  BILITOT 0.4 0.3  PROT 6.1* 5.9*  ALBUMIN 4.0 3.8   Recent Labs  Lab 12/03/23 0900  LIPASE 77*   [name] results for input(s): AMMONIA in the last 168 hours. Coagulation Profile: [name] results for input(s): INR, PROTIME in the last 168 hours. Cardiac Enzymes: [name] results for input(s): CKTOTAL, CKMB, CKMBINDEX,  TROPONINI in the last 168 hours. BNP (last 3 results) [name] results for input(s): PROBNP in the last 8760 hours. HbA1C: [name] results for input(s): HGBA1C in the last 72 hours. CBG: [name] results for input(s): GLUCAP in the last 168 hours. Lipid Profile: [name] results for input(s): CHOL, HDL, LDLCALC, TRIG, CHOLHDL, LDLDIRECT in the last 72 hours. Thyroid  Function Tests: Recent Labs    12/03/23 0900  TSH 0.852   Anemia Panel: [name] results for input(s): VITAMINB12, FOLATE, FERRITIN, TIBC, IRON, RETICCTPCT in the last 72 hours. Sepsis Labs: [name] results for input(s): PROCALCITON, LATICACIDVEN in the last 168 hours.  [name] results found for this or any previous visit (from the past 240 hours).       Radiology Studies: [name] results found.      Scheduled Meds:  cholecalciferol   1,000 Units Oral Daily   cyanocobalamin  2,000 mcg Oral Daily   docusate sodium  100 mg Oral Daily   enoxaparin  (LOVENOX ) injection  40 mg Subcutaneous Q24H   feeding supplement (KATE FARMS STANDARD 1.4)  1,300 mL Per Tube Daily   FLUoxetine  10 mg Oral Daily   fluticasone  furoate-vilanterol  1 puff Inhalation Daily   ketorolac   2 drop Both Eyes QID   lipase/protease/amylase  36,000 Units Oral With snacks   lipase/protease/amylase  72,000 Units Oral TID WC   multivitamin with minerals  1 tablet Oral Daily   nicotine   14 mg Transdermal Daily   pantoprazole   40 mg Oral Daily   thiamine   100 mg Oral Daily   traZODone  100 mg Oral QHS     LOS: 0 days    Time spent: 55 minutes    Brent Haen JONETTA Fairly, DO [facility]  If 7PM-7AM, please contact night-coverage [email or web address] 12/04/2023, 2:19 PM

## 2023-12-04 NOTE — Progress Notes (Addendum)
 Initial Nutrition Assessment  DOCUMENTATION CODES:   Severe malnutrition in context of chronic illness, Underweight  INTERVENTION:   TF via J-port of GJ-tube:  The [employer] standard 1.4 at 108 ml/h x 12 hours from 8am to 8pm (will need 4 containers of [employer] standard 1.4)  Provides 1820 kcal, 80 gm protein, 924 ml free water  daily  Flush J port with 100 ml water  before and after the tube feeding is administered  When fluid status allows, recommend adding an additional 150 ml free water  flush BID  Monitor magnesium , potassium, and phosphorus daily for at least 3 days, MD to replete as needed, as pt is at risk for refeeding syndrome given severe PCM with inadequate intake for the past few weeks to a month. Add Thiamine  100 mg daily for 7 days.  After discharge home, can resume home TF regimen with Nutren 2.0 4 containers per day via J-port of GJ-tube. Run at 100 ml/h x 10 hours per day.   NUTRITION DIAGNOSIS:   Severe Malnutrition related to chronic illness (chronic pancreatitis) as evidenced by severe muscle depletion, severe fat depletion.  GOAL:   Patient will meet greater than or equal to 90% of their needs  MONITOR:   TF tolerance  REASON FOR ASSESSMENT:   Consult Assessment of nutrition requirement/status  ASSESSMENT:   53 yo male admitted with suicidal ideation, abdominal pain. PMH includes chronic pancreatitis, anxiety disorder, depression, multiple sclerosis, fibromyalgia, COPD, esophageal dilatation, BPH, DDD, GJ-tube.  Patient reports that he doesn't eat much because it will cause a pancreatitis flare up. He eats small amounts of potatoes and other soft foods and takes his creon  by mouth. He usually takes his TF (Nutren 2.0) 2 containers running at 100 ml/h via J-port twice daily for a total of 4 containers per day, but has only been taking 1, 2, or 3 containers per day for the past month because he hasn't had his medicine.   Okay for RD to adjust TF orders per  discussion with attending MD.   Weight history reviewed.  Patient with 3% weight loss within the past 2 weeks.  Labs reviewed.  Na 134 K 3.4 BUN <5  Medications reviewed and include vitamin D , vitamin B12, colace, PO creon  with meals & snacks, MVI, protonix .   Patient meets criteria for severe malnutrition, given severe depletion of muscle and subcutaneous fat mass.  NUTRITION - FOCUSED PHYSICAL EXAM:  Flowsheet Row Most Recent Value  Orbital Region Severe depletion  Upper Arm Region Severe depletion  Thoracic and Lumbar Region Severe depletion  Buccal Region Severe depletion  Temple Region Severe depletion  Clavicle Bone Region Severe depletion  Clavicle and Acromion Bone Region Severe depletion  Scapular Bone Region Severe depletion  Dorsal Hand Severe depletion  Patellar Region Severe depletion  Anterior Thigh Region Severe depletion  Posterior Calf Region Severe depletion  Edema (RD Assessment) None  Hair Reviewed  Eyes Reviewed  Mouth Reviewed  Skin Reviewed  Nails Reviewed    Diet Order:   Diet Order             Diet regular Fluid consistency: Thin  Diet effective now                   EDUCATION NEEDS:   Education needs have been addressed  Skin:  Skin Assessment: Reviewed RN Assessment  Last BM:  unknown  Height:   Ht Readings from Last 1 Encounters:  12/03/23 5' 7 (1.702 m)    Weight:  Wt Readings from Last 1 Encounters:  12/03/23 45.4 kg    Ideal Body Weight:  67.3 kg  BMI:  Body mass index is 15.68 kg/m.  Estimated Nutritional Needs:   Kcal:  1600-1800  Protein:  75-90 gm  Fluid:  1.6-1.8 L   Suzen HUNT RD, LDN, CNSC Contact via secure chat. If unavailable, use group chat RD Inpatient.

## 2023-12-04 NOTE — Consult Note (Signed)
 Attempted to see patient for psych assessment via TTS cart.  Patient's nurse was at lunch but agrees to get cart set up now.

## 2023-12-04 NOTE — Consult Note (Signed)
 Sent message to nursing staff, Daphne Slicker, requesting to see patient for psychiatric assessment via tts cart.  Awaiting response.

## 2023-12-04 NOTE — Care Management Obs Status (Signed)
 MEDICARE OBSERVATION STATUS NOTIFICATION   Patient Details  Name: Brent [name] MRN: [id number] Date of Birth: [birth year]   Medicare Observation Status Notification Given:  Yes    [name] 12/04/2023, 4:35 PM

## 2023-12-04 NOTE — Plan of Care (Signed)
   Problem: Education: Goal: Knowledge of General Education information will improve Description: Including pain rating scale, medication(s)/side effects and non-pharmacologic comfort measures Outcome: Progressing   Problem: Safety: Goal: Ability to remain free from injury will improve Outcome: Progressing   Problem: Skin Integrity: Goal: Risk for impaired skin integrity will decrease Outcome: Progressing

## 2023-12-04 NOTE — Consult Note (Signed)
 Nurse report TTS cart is at the bedside, several attempts to reach patient, however machine answered by ICU staff.  Reached back to [employer], who apologized for the confusion and agreed to get the cart to patient.  Psychiatry will continue to reach out for patient evaluation.

## 2023-12-05 DIAGNOSIS — Z720 Tobacco use: Secondary | ICD-10-CM | POA: Diagnosis not present

## 2023-12-05 DIAGNOSIS — F339 Major depressive disorder, recurrent, unspecified: Secondary | ICD-10-CM

## 2023-12-05 DIAGNOSIS — F4321 Adjustment disorder with depressed mood: Secondary | ICD-10-CM | POA: Diagnosis not present

## 2023-12-05 DIAGNOSIS — K861 Other chronic pancreatitis: Secondary | ICD-10-CM

## 2023-12-05 DIAGNOSIS — E871 Hypo-osmolality and hyponatremia: Secondary | ICD-10-CM | POA: Diagnosis not present

## 2023-12-05 DIAGNOSIS — F419 Anxiety disorder, unspecified: Secondary | ICD-10-CM | POA: Diagnosis not present

## 2023-12-05 DIAGNOSIS — F332 Major depressive disorder, recurrent severe without psychotic features: Secondary | ICD-10-CM | POA: Diagnosis not present

## 2023-12-05 DIAGNOSIS — K859 Acute pancreatitis without necrosis or infection, unspecified: Secondary | ICD-10-CM

## 2023-12-05 LAB — CBC
HCT: 36.6 % — ABNORMAL LOW (ref 39.0–52.0)
Hemoglobin: 12.1 g/dL — ABNORMAL LOW (ref 13.0–17.0)
MCH: 31.5 pg (ref 26.0–34.0)
MCHC: 33.1 g/dL (ref 30.0–36.0)
MCV: 95.3 fL (ref 80.0–100.0)
Platelets: 295 K/uL (ref 150–400)
RBC: 3.84 MIL/uL — ABNORMAL LOW (ref 4.22–5.81)
RDW: 13.2 % (ref 11.5–15.5)
WBC: 4.3 K/uL (ref 4.0–10.5)
nRBC: 0 % (ref 0.0–0.2)

## 2023-12-05 LAB — BASIC METABOLIC PANEL WITH GFR
Anion gap: 9 (ref 5–15)
BUN: 6 mg/dL (ref 6–20)
CO2: 24 mmol/L (ref 22–32)
Calcium: 8.6 mg/dL — ABNORMAL LOW (ref 8.9–10.3)
Chloride: 102 mmol/L (ref 98–111)
Creatinine, Ser: 0.69 mg/dL (ref 0.61–1.24)
GFR, Estimated: >60 mL/min (ref >60)
Glucose, Bld: 125 mg/dL — ABNORMAL HIGH (ref 70–99)
Potassium: 4 mmol/L (ref 3.5–5.1)
Sodium: 135 mmol/L (ref 135–145)

## 2023-12-05 LAB — GLUCOSE, CAPILLARY
Glucose-Capillary: 110 mg/dL — ABNORMAL HIGH (ref 70–99)
Glucose-Capillary: 77 mg/dL (ref 70–99)
Glucose-Capillary: 116 mg/dL — ABNORMAL HIGH (ref 70–99)
Glucose-Capillary: 120 mg/dL — ABNORMAL HIGH (ref 70–99)
Glucose-Capillary: 110 mg/dL — ABNORMAL HIGH (ref 70–99)
Glucose-Capillary: 116 mg/dL — ABNORMAL HIGH (ref 70–99)

## 2023-12-05 LAB — MAGNESIUM: Magnesium: 2 mg/dL (ref 1.7–2.4)

## 2023-12-05 LAB — PHOSPHORUS: Phosphorus: 3.2 mg/dL (ref 2.5–4.6)

## 2023-12-05 NOTE — Progress Notes (Signed)
 PROGRESS NOTE    Brent [name]  FMW:[id number] DOB: April 19, 1970 DOA: 12/03/2023 PCP: Freddrick, No   Brief Narrative:    Brent [name] is a 53 y.o. 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 He was admitted for acute hyponatremia likely in the setting of chronic pancreatitis and also for evaluation for suicidal ideations.  Assessment & Plan:   Principal Problem:   Acute hyponatremia Active Problems:   Acute on chronic pancreatitis (HCC)   Anxiety   MDD (major depressive disorder), recurrent severe, without psychosis (HCC)  Assessment and Plan:  Acute hyponatremia likely in the setting of poor oral intake with chronic pancreatitis-resolved - Further workup with urine and serum osmolarity, TSH, and urine sodium - Likely due to poor oral intake in the setting of chronic pancreatitis - IV fluid hydration with normal saline - Regular diet - Antiemetics - Pain medications as needed   Suicidal ideation with MDD - Patient has missed 2 doses of liquid Prozac as he was not able to receive this - Sitter - Plan for TTS evaluation today, cart not placed in room yesterday   Questionable multiple sclerosis - Holding off on disease modifying drugs as this was unclear  Mild hypotension -Asymptomatic -Likely exacerbated from Dilaudid  and Xanax  earlier -Frail and likely has soft bp chronically   Anxiety disorder/depression - Continue medications as prior    DVT prophylaxis:Lovenox  Code Status: Full Family Communication: None Disposition Plan:  Status is: Observation The patient remains OBS appropriate and will d/c before 2 midnights.   Consultants:  Psychiatry  Procedures:  None  Antimicrobials:  None   Subjective: Patient seen and evaluated today with no new acute complaints or concerns. No  acute concerns or events noted overnight.  He is currently awaiting TTS evaluation and denies any abdominal pain, nausea, or vomiting.  Objective: Vitals:   12/04/23 1328 12/04/23 2024 12/05/23 0403 12/05/23 0839  BP: (!) 88/66 (!) 85/66 90/69   Pulse: 78 85 78   Resp: 17 20 19    Temp: 97.9 F (36.6 C) 98.2 F (36.8 C) 97.7 F (36.5 C)   TempSrc:      SpO2: 98% 95% 93% 98%  Weight:   46.1 kg   Height:        Intake/Output Summary (Last 24 hours) at 12/05/2023 1127 Last data filed at 12/05/2023 0900 Gross per 24 hour  Intake 720 ml  Output 2900 ml  Net -2180 ml   Filed Weights   12/03/23 0659 12/03/23 1459 12/05/23 0403  Weight: 46.7 kg 45.4 kg 46.1 kg    Examination:  General exam: Appears calm and comfortable  Respiratory system: Clear to auscultation. Respiratory effort normal. Cardiovascular system: S1 & S2 heard, RRR.  Gastrointestinal system: Abdomen is soft Central nervous system: Alert and awake Extremities: No edema Skin: No significant lesions noted Psychiatry: Flat affect.    Data Reviewed: I have personally reviewed following labs and imaging studies  CBC: Recent Labs  Lab 12/03/23 0900 12/04/23 0407 12/05/23 0448  WBC 7.3 3.8* 4.3  NEUTROABS 4.9  --   --   HGB 12.5* 13.0 12.1*  HCT 34.9* 38.7* 36.6*  MCV 88.6 92.8 95.3  PLT 328 316 295   Basic Metabolic Panel: Recent Labs  Lab 12/03/23 0900 12/04/23 0407 12/05/23 0448  NA 125* 134* 135  K 3.4* 3.4* 4.0  CL 90* 98 102  CO2  25 28 24   GLUCOSE 89 75 125*  BUN 5* <5* 6  CREATININE 0.64 0.62 0.69  CALCIUM  8.9 8.7* 8.6*  MG  --  2.0 2.0  PHOS  --   --  3.2   GFR: Estimated Creatinine Clearance: 68 mL/min (by C-G formula based on SCr of 0.69 mg/dL). Liver Function Tests: Recent Labs  Lab 12/03/23 0900 12/04/23 0407  AST 17 16  ALT 11 11  ALKPHOS 74 68  BILITOT 0.4 0.3  PROT 6.1* 5.9*  ALBUMIN 4.0 3.8   Recent Labs  Lab 12/03/23 0900  LIPASE 77*   No results for  input(s): AMMONIA in the last 168 hours. Coagulation Profile: No results for input(s): INR, PROTIME in the last 168 hours. Cardiac Enzymes: No results for input(s): CKTOTAL, CKMB, CKMBINDEX, TROPONINI in the last 168 hours. BNP (last 3 results) No results for input(s): PROBNP in the last 8760 hours. HbA1C: No results for input(s): HGBA1C in the last 72 hours. CBG: Recent Labs  Lab 12/04/23 1933 12/04/23 2343 12/05/23 0404 12/05/23 0724 12/05/23 1121  GLUCAP 139* 129* 110* 77 116*   Lipid Profile: No results for input(s): CHOL, HDL, LDLCALC, TRIG, CHOLHDL, LDLDIRECT in the last 72 hours. Thyroid  Function Tests: Recent Labs    12/03/23 0900  TSH 0.852   Anemia Panel: No results for input(s): VITAMINB12, FOLATE, FERRITIN, TIBC, IRON, RETICCTPCT in the last 72 hours. Sepsis Labs: No results for input(s): PROCALCITON, LATICACIDVEN in the last 168 hours.  No results found for this or any previous visit (from the past 240 hours).       Radiology Studies: No results found.      Scheduled Meds:  cholecalciferol   1,000 Units Oral Daily   cyanocobalamin  2,000 mcg Oral Daily   docusate sodium  100 mg Oral Daily   enoxaparin  (LOVENOX ) injection  40 mg Subcutaneous Q24H   feeding supplement (KATE FARMS STANDARD 1.4)  1,300 mL Per Tube Daily   FLUoxetine  10 mg Oral Daily   fluticasone  furoate-vilanterol  1 puff Inhalation Daily   ketorolac   2 drop Both Eyes QID   lipase/protease/amylase  36,000 Units Oral With snacks   lipase/protease/amylase  72,000 Units Oral TID WC   multivitamin with minerals  1 tablet Oral Daily   nicotine   14 mg Transdermal Daily   pantoprazole   40 mg Oral Daily   thiamine   100 mg Oral Daily   traZODone  100 mg Oral QHS     LOS: 0 days    Time spent: 55 minutes    Vanesa Renier JONETTA Fairly, DO [facility]  If 7PM-7AM, please contact night-coverage [email or web address] 12/05/2023, 11:27 AM

## 2023-12-05 NOTE — TOC Progression Note (Addendum)
 Transition of Care [facility]) - Progression Note    Patient Details  Name: Brent [name] MRN: [id number] Date of Birth: August 09, 1970  [facility] CM/SW Contact  Hoy DELENA Bigness, LCSW Phone Number: 12/05/2023, 12:15 PM  Clinical Narrative:    Pt recommended for inpatient psychiatric placement per Cathaleen Adam, PMHNP. CSW has reached out to [facility] to review for placement at [facility] or [facility].   ADDENDUM: Per Brook McNichols, AC- no beds available at [facility].  Referrals have been faxed out to the following facilities for placement:   [facility]   [facility]   [facility]   [facility]   [facility]   [facility]   [facility]   [facility]   [facility]   [facility]   [facility]   [facility]   [facility]   [facility]   [facility]   [facility]   [facility]   [facility]   [facility]   [facility]   [facility]   [facility]   [facility]       Barriers to Discharge: Continued Medical Work up               Expected Discharge Plan and Services                                               Social Drivers of Health (SDOH) Interventions SDOH Screenings   Food Insecurity: Food Insecurity Present (12/03/2023)  Housing: High Risk (12/03/2023)  Transportation Needs: Unmet Transportation Needs (12/03/2023)  Utilities: At Risk (12/03/2023)  Alcohol  Screen: Low Risk   (11/22/2023)  Financial Resource Strain: Low Risk  (02/24/2018)  Tobacco Use: High Risk (12/03/2023)    Readmission Risk Interventions     No data to display

## 2023-12-05 NOTE — Plan of Care (Signed)

## 2023-12-05 NOTE — Consult Note (Signed)
 [facility] Psychiatric Consult Initial  Patient Name: .Brent [name]  MRN: [id number]  DOB: 15-Jul-1970  Consult Order details:  Orders (From admission, onward)     Start     Ordered   12/04/23 0746  CONSULT TO CALL ACT TEAM       Ordering Provider: Maree Adron BIRCH, DO  Provider:  (Not yet assigned)  Question:  Reason for Consult?  Answer:  suicidal ideation   12/04/23 0746             Mode of Visit: Tele-visit Virtual Statement:TELE PSYCHIATRY ATTESTATION & CONSENT As the provider for this telehealth consult, I attest that I verified the patient's identity using two separate identifiers, introduced myself to the patient, provided my credentials, disclosed my location, and performed this encounter via a HIPAA-compliant, real-time, face-to-face, two-way, interactive audio and video platform and with the full consent and agreement of the patient (or guardian as applicable.) Patient physical location: [name]. Telehealth provider physical location: home office in state of [place].   Video start time: 1055 Video end time: 1125    Psychiatry Consult Evaluation  Service Date: December 05, 2023 LOS:  LOS: 0 days  Chief Complaint "I don't trust myself if I go home."  Primary Psychiatric Diagnoses  Major Depressive Disorder, recurrent Adjustment Disorder with depressed mood Tobacco Abuse  Assessment  UMAIR [name] is a 53 y.o. male admitted: Presented to the EDfor 12/03/2023  6:50 AM BIB [facility] from home for complaint of LLQ abdominal pain and troubling thoughts. Was seen 8 months ago for the same abdominal pain. Stated has had thoughts of hurting self but no plan at this time.  He carries the psychiatric diagnoses of anxiety and depression and has a past medical history of pancreatitis with a GJ tube.   This is a 53 year old male with chronic medical illness, recent psychiatric hospitalization, and medication nonadherence, now presenting with worsening depression, suicidal ideation, and  possible psychotic or confusional symptoms. His inability to contract for safety, poor support system, and guardedness increase risk for self-harm. Please see plan below for detailed recommendations.   Diagnoses:  Active Hospital problems: Principal Problem:   MDD (major depressive disorder), recurrent severe, without psychosis (HCC) Active Problems:   Anxiety   Acute on chronic pancreatitis (HCC)   Acute hyponatremia    Plan   ## Psychiatric Medication Recommendations:  Continue home medications  ## Medical Decision Making Capacity: Not specifically addressed in this encounter  ## Further Work-up:  EKG or UDS -- Pertinent labwork reviewed earlier this admission includes:  CMP- WNL CBC- WNL- no leukocytosis    ## Disposition:-- We recommend inpatient psychiatric hospitalization when medically cleared. Patient is under voluntary admission status at this time.  ## Behavioral / Environmental: -Difficult Patient (SELECT OPTIONS FROM BELOW), To minimize splitting of staff, assign one staff person to communicate all information from the team when feasible., or Utilize compassion and acknowledge the patient's experiences while setting clear and realistic expectations for care.    ## Safety and Observation Level:  - Based on my clinical evaluation, I estimate the patient to be at low risk of self harm in the current setting. - At this time, we recommend  routine. This decision is based on my review of the chart including patient's history and current presentation, interview of the patient, mental status examination, and consideration of suicide risk including evaluating suicidal ideation, plan, intent, suicidal or self-harm behaviors, risk factors, and protective factors. This judgment is based on our  ability to directly address suicide risk, implement suicide prevention strategies, and develop a safety plan while the patient is in the clinical setting. Please contact our team if there is a  concern that risk level has changed.   CSSR Risk Category:C-SSRS RISK CATEGORY: Low Risk   Suicide Risk Assessment: Patient has following modifiable risk factors for suicide: active suicidal ideation, untreated depression, current symptoms: anxiety/panic, insomnia, impulsivity, anhedonia, hopelessness, and triggering events, which we are addressing by reviewing and starting fluoxetine 10mg  po daily with patient and referring for psychiatric admission for mood stabilization. Patient has following non-modifiable or demographic risk factors for suicide: male gender Patient has the following protective factors against suicide: no history of suicide attempts and no history of NSSIB   Thank you for this consult request. Recommendations have been communicated to the primary team.  We will continue to follow patient at this time.   Cyra Spader MOTLEY-MANGRUM, PMHNP       History of Present Illness  Relevant Aspects of Hospital ED Course:  Admitted on 12/03/2023 BIB [facility] from home for complaint of LLQ abdominal pain and troubling thoughts. Was seen 8 months ago for the same abdominal pain. Stated has had thoughts of hurting self but no plan at this time.   Patient Report:  The patient presents with worsening depression, anxiety, and suicidal ideation in the context of chronic medical illness and medication nonadherence. He reports that following discharge from a recent behavioral health admission at [facility], his brother and niece did not retrieve his prescribed antidepressant and Xanax , resulting in being off psychiatric medications for approximately one to two weeks.  Since that time, he has experienced increased nervousness, anxiety, and perceptual disturbances. He describes "talking with people who aren't there," uncertainty about whether events have truly occurred, and moments of confusion--such as feeling he had "won a million dollars" or questioning whether his daughter actually visited him  last weekend.  He endorses active suicidal thoughts and expresses concern about his safety if discharged home. He denies access to firearms but vaguely acknowledges "other means" of self-harm without elaborating. He is guarded and minimally forthcoming about specific stressors, though notes a recent argument with his brother as a trigger.  He reports sleep and appetite as fair. He remains independent in care of his PEG/J-tube.  Due to active suicidal ideation with inability to contract for safety, recent medication nonadherence, psychotic-like symptoms, and limited social supports, inpatient psychiatric admission is strongly indicated for safety and stabilization.  Patient had a follow up appointment today with [facility] for outpatient follow up, he did not follow up and did not remember. This provider looked at his [facility]S from [facility] and informed him.   Psych ROS:  Depression: Endorses Anxiety:  Endorses Mania (lifetime and current): Denies  Psychosis: (lifetime and current): Denies    Collateral information:  Attempted to contact brother Tamarick Kovalcik, no answer left a HIPAA compliant voicemail.  Review of Systems  Psychiatric/Behavioral:  Positive for depression and suicidal ideas.      Psychiatric and Social History  Psychiatric History:  Information collected from patient   Prev Dx/Sx: Depression, Tobacco Abuse, Agoraphobia, Panic Disorder Current Psych Provider:  Home Meds (current): [name] [address] [place] Previous Med Trials: Alprazolam  1mg  po TID for anxiety Therapy: denies   Prior Psych Hospitalization: denies  Prior Self Harm: pt denies Prior Violence: pt denies   Family Psych History: pt is unsure of dx Family Hx suicide: denies   Social History:  Developmental Hx:no concerns Educational Hx: 12th grade diploma Occupational Hx: unemployed and received disability check for  Legal Hx: denies Living Situation: lives  alone Spiritual Hx: unknown Access to weapons/lethal means: pt denies    Substance History Alcohol : not since 2012  History of DT's denies Tobacco: 1/2 pack daily since 53 years old Illicit drugs: he denies Prescription drug abuse: he denies Rehab hx: pt denies  Exam Findings  Physical Exam:  Vital Signs:  Temp:  [97.7 F (36.5 C)-98.2 F (36.8 C)] 97.7 F (36.5 C) (10/24 0403) Pulse Rate:  [78-85] 78 (10/24 0403) Resp:  [17-20] 19 (10/24 0403) BP: (85-90)/(66-69) 90/69 (10/24 0403) SpO2:  [93 %-98 %] 98 % (10/24 0839) Weight:  [46.1 kg] 46.1 kg (10/24 0403) Blood pressure 90/69, pulse 78, temperature 97.7 F (36.5 C), resp. rate 19, height 5' 7 (1.702 m), weight 46.1 kg, SpO2 98%. Body mass index is 15.92 kg/m.  Physical Exam Vitals and nursing note reviewed. Exam conducted with a chaperone present.  Neurological:     Mental Status: He is alert.  Psychiatric:        Attention and Perception: Attention normal.        Mood and Affect: Mood is anxious and depressed. Affect is flat.        Speech: Speech is delayed.        Behavior: Behavior is cooperative.        Thought Content: Thought content includes suicidal ideation.        Judgment: Judgment is inappropriate.     Mental Status Exam: General Appearance: Appropriately groomed, appears stated age  Orientation:  Full (Time, Place, and Person)  Memory:  Immediate;   Fair Recent;   Fair  Concentration:  Concentration: Fair  Recall:  Fair  Attention  Fair  Eye Contact:  Fair  Speech:  Pressured  Language:  Good  Volume:  Decreased  Mood: "Depressed, nervous"  Affect:  Constricted, congruent with mood  Thought Process:  Coherent  Thought Content:  Tangential  Suicidal Thoughts:  Yes.  without intent/plan  Homicidal Thoughts:  No  Judgement:  Fair  Insight:  Fair  Psychomotor Activity:  Normal  Akathisia:  No  Fund of Knowledge:  Fair      Assets:  Manufacturing systems engineer Desire for  Improvement Housing Social Support  Cognition:  WNL  ADL's:  Impaired  AIMS (if indicated):        Other History   These have been pulled in through the EMR, reviewed, and updated if appropriate.  Family History:  The patient's family history includes Breast cancer in his mother; Hypertension in his mother; Lung cancer (age of onset: 7) in his father; Melanoma in his mother; Stroke in his mother.  Medical History: Past Medical History:  Diagnosis Date  . Alcohol  use   . Anxiety    Disabled due to panic attacks  . Arthritis   . Bilateral ureteral calculi   . Borderline type 2 diabetes mellitus   . BPH (benign prostatic hypertrophy)   . Chronic back pain   . Condylomata acuminata in male    multiple procedures --  penile, peri-rectal , perineum  . DDD (degenerative disc disease), cervical   . Depression   . Dyspnea on exertion   . Emphysematous COPD (HCC)   . Epiretinal membrane (ERM) of both eyes   . Fibromyalgia   . GERD (gastroesophageal reflux disease)   . Headache(784.0)   . History of chronic pancreatitis    severeal  adx for this /  2008  dx alcoholic pancreatitis  . History of esophageal dilatation   . History of hiatal hernia   . History of kidney stones   . History of panic attacks   . History of sepsis    adx 05-01-2014--  urosepsis due to kidney stones obstruction/ hydronephrosis  . History of suicidal ideation    2006   adx  . Hypertension    was on medication for short time, htn was caused by prednisone  per pt  . Multiple sclerosis    pt. states has 4 small brain lesions  . Nephrolithiasis    bilateral   . Retinal vasculitis   . Uveitis     Surgical History: Past Surgical History:  Procedure Laterality Date  . BIOPSY  11/12/2012   Procedure: GASTRIC AND ESOPHAGEAL BIOPSIES;  Surgeon: Lamar CHRISTELLA Hollingshead, MD;  Location: [facility];  Service: Endoscopy;;  . CATARACT EXTRACTION W/ INTRAOCULAR LENS  IMPLANT, BILATERAL    . COLONOSCOPY  10/08/2011    Jenkins:Normal colon/Anal condyloma without extension proximal to dentate line  . CYSTOSCOPY W/ URETERAL STENT PLACEMENT Bilateral 05/01/2014   Procedure: CYSTOSCOPY WITH BILATERAL RETROGRADE PYELOGRAM; BILATERAL URETERAL STENT PLACEMENT;  Surgeon: Alm Fragmin, MD;  Location: [facility];  Service: Urology;  Laterality: Bilateral;  . CYSTOSCOPY W/ URETERAL STENT REMOVAL Bilateral 06/06/2014   Procedure: CYSTOSCOPY WITH STENT REMOVAL;  Surgeon: Garnette Shack, MD;  Location[facility];  Service: Urology;  Laterality: Bilateral;  . CYSTOSCOPY WITH URETEROSCOPY  06/06/2014   Procedure: CYSTOSCOPY WITH URETEROSCOPY;  Surgeon: Garnette Shack, MD;  Location[facility];  Service: Urology;;  . PHYLLIS WITH URETEROSCOPY AND STENT PLACEMENT Bilateral 06/06/2014   Procedure: CYSTOSCOPY WITH J2 STENT EXTRACTION,,URETEROSCOPY WITH EXTRACTION OF STONES,;  Surgeon: Garnette Shack, MD;  Location: [facility];  Service: Urology;  Laterality: Bilateral;  . ELECTROCAUTERY/ DESICCATION OF CONDYLOMA LESIONS  01-15-2008  &  12-29-2009   PENIS, PERI-RECTAL , PERINUEM  . ESOPHAGOGASTRODUODENOSCOPY (EGD) WITH PROPOFOL  N/A 11/12/2012   MFM:Wnmfjo esophagus s/p  passage of a Maloney dilator and biopsy. Abnormal gastric mucosa-status post biopsy  . FLEXIBLE BRONCHOSCOPY N/A 08/12/2012   Procedure: FLEXIBLE BRONCHOSCOPY;  Surgeon: Dallas LITTIE Gelineau, MD;  Location: [facility];  Service: Pulmonary;  Laterality: N/A;  . MALONEY DILATION N/A 11/12/2012   Procedure: MALONEY DILATION (54mm);  Surgeon: Lamar CHRISTELLA Hollingshead, MD;  Location: [facility];  Service: Endoscopy;  Laterality: N/A;  . MULTIPLE EXTRACTIONS WITH ALVEOLOPLASTY N/A 04/22/2014   Procedure: MULTIPLE EXTRACTIONS ( 2,3,5,6,7,8,9,10,11,13,14,15,21,28  WITH ALVEOLOPLASTY;  Surgeon: Glendia Primrose, DDS;  Location: [facility];  Service: Oral Surgery;  Laterality: N/A;  . WISDOM TOOTH EXTRACTION       Medications:   Current  Facility-Administered Medications:  .  acetaminophen  (TYLENOL ) tablet 650 mg, 650 mg, Oral, Q6H PRN, 650 mg at 12/05/23 0300 **OR** acetaminophen  (TYLENOL ) suppository 650 mg, 650 mg, Rectal, Q6H PRN, Maree, Pratik D, DO .  ALPRAZolam  (XANAX ) tablet 1 mg, 1 mg, Oral, TID PRN, Maree, Pratik D, DO, 1 mg at 12/05/23 0824 .  artificial tears ophthalmic solution 1 drop, 1 drop, Both Eyes, PRN, Maree, Pratik D, DO, 1 drop at 12/05/23 0641 .  cholecalciferol  (VITAMIN D3) 25 MCG (1000 UNIT) tablet 1,000 Units, 1,000 Units, Oral, Daily, Maree Bracken D, DO, 1,000 Units at 12/05/23 0816 .  cyanocobalamin (VITAMIN B12) tablet 2,000 mcg, 2,000 mcg, Oral, Daily, Maree Bracken D, DO, 2,000 mcg at 12/05/23 0817 .  docusate sodium (COLACE) capsule  100 mg, 100 mg, Oral, Daily, Maree, Pratik D, DO, 100 mg at 12/05/23 9182 .  enoxaparin  (LOVENOX ) injection 40 mg, 40 mg, Subcutaneous, Q24H, Maree, Pratik D, DO, 40 mg at 12/04/23 2137 .  feeding supplement (KATE FARMS STANDARD 1.4) liquid 1,300 mL, 1,300 mL, Per Tube, Daily, Maree, Pratik D, DO .  FLUoxetine (PROZAC) capsule 10 mg, 10 mg, Oral, Daily, Maree, Pratik D, DO, 10 mg at 12/05/23 0816 .  fluticasone  furoate-vilanterol (BREO ELLIPTA) 100-25 MCG/ACT 1 puff, 1 puff, Inhalation, Daily, Maree, Pratik D, DO, 1 puff at 12/05/23 9162 .  HYDROmorphone  (DILAUDID ) injection 0.5-1 mg, 0.5-1 mg, Intravenous, Q2H PRN, Maree, Pratik D, DO, 0.5 mg at 12/05/23 0640 .  ketorolac  (ACULAR ) 0.5 % ophthalmic solution 2 drop, 2 drop, Both Eyes, QID, Shah, Pratik D, DO, 2 drop at 12/04/23 2141 .  lipase/protease/amylase (CREON ) capsule 36,000 Units, 36,000 Units, Oral, With snacks, Maree Adron BIRCH, DO, 36,000 Units at 12/04/23 2147 .  lipase/protease/amylase (CREON ) capsule 72,000 Units, 72,000 Units, Oral, TID WC, Maree Adron BIRCH, DO, 72,000 Units at 12/05/23 9183 .  multivitamin with minerals tablet 1 tablet, 1 tablet, Oral, Daily, Maree, Pratik D, DO, 1 tablet at 12/05/23 9182 .  nicotine   (NICODERM CQ  - dosed in mg/24 hours) patch 14 mg, 14 mg, Transdermal, Daily, Maree, Pratik D, DO, 14 mg at 12/05/23 9180 .  ondansetron  (ZOFRAN ) tablet 4 mg, 4 mg, Oral, Q6H PRN **OR** ondansetron  (ZOFRAN ) injection 4 mg, 4 mg, Intravenous, Q6H PRN, Maree, Pratik D, DO .  pantoprazole  (PROTONIX ) EC tablet 40 mg, 40 mg, Oral, Daily, Maree, Pratik D, DO, 40 mg at 12/05/23 0817 .  simethicone  (MYLICON) chewable tablet 80 mg, 80 mg, Oral, Q6H PRN, Maree Adron D, DO, 80 mg at 12/04/23 2136 .  thiamine  (VITAMIN B1) tablet 100 mg, 100 mg, Oral, Daily, Maree, Pratik D, DO, 100 mg at 12/05/23 0817 .  traZODone (DESYREL) tablet 100 mg, 100 mg, Oral, QHS, Shah, Pratik D, DO, 100 mg at 12/04/23 2136  Allergies: No Known Allergies  Diamante Rubin MOTLEY-MANGRUM, PMHNP

## 2023-12-06 ENCOUNTER — Inpatient Hospital Stay
Admission: AD | Admit: 2023-12-06 | Discharge: 2023-12-11 | DRG: 885 | Disposition: A | Discharge status: Home / Self Care | Source: Intra-hospital | Attending: Psychiatry | Admitting: Psychiatry

## 2023-12-06 ENCOUNTER — Encounter: Payer: Self-pay | Admitting: Psychiatry

## 2023-12-06 ENCOUNTER — Other Ambulatory Visit: Payer: Self-pay

## 2023-12-06 DIAGNOSIS — F13239 Sedative, hypnotic or anxiolytic dependence with withdrawal, unspecified: Secondary | ICD-10-CM | POA: Diagnosis present

## 2023-12-06 DIAGNOSIS — Z79899 Other long term (current) drug therapy: Secondary | ICD-10-CM

## 2023-12-06 DIAGNOSIS — F332 Major depressive disorder, recurrent severe without psychotic features: Principal | ICD-10-CM | POA: Diagnosis present

## 2023-12-06 DIAGNOSIS — J439 Emphysema, unspecified: Secondary | ICD-10-CM | POA: Diagnosis present

## 2023-12-06 DIAGNOSIS — F410 Panic disorder [episodic paroxysmal anxiety] without agoraphobia: Secondary | ICD-10-CM | POA: Diagnosis present

## 2023-12-06 DIAGNOSIS — F4000 Agoraphobia, unspecified: Secondary | ICD-10-CM | POA: Diagnosis present

## 2023-12-06 DIAGNOSIS — K30 Functional dyspepsia: Secondary | ICD-10-CM | POA: Diagnosis present

## 2023-12-06 DIAGNOSIS — R45851 Suicidal ideations: Secondary | ICD-10-CM | POA: Diagnosis present

## 2023-12-06 DIAGNOSIS — Z681 Body mass index (BMI) 19 or less, adult: Secondary | ICD-10-CM | POA: Diagnosis not present

## 2023-12-06 DIAGNOSIS — F514 Sleep terrors [night terrors]: Secondary | ICD-10-CM | POA: Diagnosis present

## 2023-12-06 DIAGNOSIS — Z8249 Family history of ischemic heart disease and other diseases of the circulatory system: Secondary | ICD-10-CM | POA: Diagnosis not present

## 2023-12-06 DIAGNOSIS — F17210 Nicotine dependence, cigarettes, uncomplicated: Secondary | ICD-10-CM | POA: Diagnosis present

## 2023-12-06 DIAGNOSIS — K861 Other chronic pancreatitis: Secondary | ICD-10-CM | POA: Diagnosis present

## 2023-12-06 DIAGNOSIS — Z9841 Cataract extraction status, right eye: Secondary | ICD-10-CM

## 2023-12-06 DIAGNOSIS — Z961 Presence of intraocular lens: Secondary | ICD-10-CM | POA: Diagnosis present

## 2023-12-06 DIAGNOSIS — Z7951 Long term (current) use of inhaled steroids: Secondary | ICD-10-CM | POA: Diagnosis not present

## 2023-12-06 DIAGNOSIS — Z1152 Encounter for screening for COVID-19: Secondary | ICD-10-CM | POA: Diagnosis not present

## 2023-12-06 DIAGNOSIS — N400 Enlarged prostate without lower urinary tract symptoms: Secondary | ICD-10-CM | POA: Diagnosis present

## 2023-12-06 DIAGNOSIS — M797 Fibromyalgia: Secondary | ICD-10-CM | POA: Diagnosis present

## 2023-12-06 DIAGNOSIS — Z87442 Personal history of urinary calculi: Secondary | ICD-10-CM

## 2023-12-06 DIAGNOSIS — F5089 Other specified eating disorder: Secondary | ICD-10-CM | POA: Diagnosis present

## 2023-12-06 DIAGNOSIS — I10 Essential (primary) hypertension: Secondary | ICD-10-CM | POA: Diagnosis present

## 2023-12-06 DIAGNOSIS — G35D Multiple sclerosis, unspecified: Secondary | ICD-10-CM | POA: Diagnosis present

## 2023-12-06 DIAGNOSIS — M5030 Other cervical disc degeneration, unspecified cervical region: Secondary | ICD-10-CM | POA: Diagnosis present

## 2023-12-06 DIAGNOSIS — Z9842 Cataract extraction status, left eye: Secondary | ICD-10-CM

## 2023-12-06 DIAGNOSIS — Z5982 Transportation insecurity: Secondary | ICD-10-CM

## 2023-12-06 DIAGNOSIS — Z5941 Food insecurity: Secondary | ICD-10-CM

## 2023-12-06 LAB — BASIC METABOLIC PANEL WITH GFR
Anion gap: 10 (ref 5–15)
BUN: 7 mg/dL (ref 6–20)
CO2: 24 mmol/L (ref 22–32)
Calcium: 9 mg/dL (ref 8.9–10.3)
Chloride: 100 mmol/L (ref 98–111)
Creatinine, Ser: 0.62 mg/dL (ref 0.61–1.24)
GFR, Estimated: >60 mL/min (ref >60)
Glucose, Bld: 119 mg/dL — ABNORMAL HIGH (ref 70–99)
Potassium: 3.9 mmol/L (ref 3.5–5.1)
Sodium: 134 mmol/L — ABNORMAL LOW (ref 135–145)

## 2023-12-06 LAB — RESP PANEL BY RT-PCR (RSV, FLU A&B, COVID)  RVPGX2
Influenza A by PCR: NEGATIVE
Influenza B by PCR: NEGATIVE
Resp Syncytial Virus by PCR: NEGATIVE
SARS Coronavirus 2 by RT PCR: NEGATIVE

## 2023-12-06 LAB — GLUCOSE, CAPILLARY
Glucose-Capillary: 127 mg/dL — ABNORMAL HIGH (ref 70–99)
Glucose-Capillary: 122 mg/dL — ABNORMAL HIGH (ref 70–99)
Glucose-Capillary: 140 mg/dL — ABNORMAL HIGH (ref 70–99)

## 2023-12-06 LAB — PHOSPHORUS: Phosphorus: 3.6 mg/dL (ref 2.5–4.6)

## 2023-12-06 LAB — MAGNESIUM: Magnesium: 2.2 mg/dL (ref 1.7–2.4)

## 2023-12-06 MED ORDER — HALOPERIDOL 5 MG PO TABS: 5.0000 mg | ORAL_TABLET | Freq: Three times a day (TID) | ORAL | Status: DC | PRN

## 2023-12-06 MED ORDER — BUTALBITAL-APAP-CAFFEINE 50-325-40 MG PO TABS
1.0000 | ORAL_TABLET | ORAL | Status: DC | PRN
  Administered 2023-12-06: 1 via ORAL
  Filled 2023-12-06: qty 1

## 2023-12-06 MED ORDER — FLUOXETINE HCL 10 MG PO CAPS
10.0000 mg | ORAL_CAPSULE | Freq: Every day | ORAL | Status: DC
  Administered 2023-12-07 – 2023-12-11 (×5): 10 mg via ORAL
  Filled 2023-12-06 (×5): qty 1

## 2023-12-06 MED ORDER — SIMETHICONE 80 MG PO CHEW
80.0000 mg | CHEWABLE_TABLET | Freq: Four times a day (QID) | ORAL | Status: DC | PRN
  Administered 2023-12-09 – 2023-12-10 (×2): 80 mg via ORAL
  Filled 2023-12-06 (×3): qty 1

## 2023-12-06 MED ORDER — HALOPERIDOL LACTATE 5 MG/ML IJ SOLN: 10.0000 mg | Freq: Three times a day (TID) | INTRAMUSCULAR | Status: DC | PRN

## 2023-12-06 MED ORDER — DIPHENHYDRAMINE HCL 25 MG PO CAPS: 50.0000 mg | ORAL_CAPSULE | Freq: Three times a day (TID) | ORAL | Status: DC | PRN

## 2023-12-06 MED ORDER — ACETAMINOPHEN 325 MG PO TABS: 650.0000 mg | ORAL_TABLET | Freq: Four times a day (QID) | ORAL | Status: DC | PRN

## 2023-12-06 MED ORDER — NAPROXEN 500 MG PO TABS
500.0000 mg | ORAL_TABLET | Freq: Two times a day (BID) | ORAL | Status: DC
Start: 2023-12-06 — End: 2023-12-11
  Administered 2023-12-06 – 2023-12-11 (×10): 500 mg via ORAL
  Filled 2023-12-06 (×11): qty 1

## 2023-12-06 MED ORDER — PANCRELIPASE (LIP-PROT-AMYL) 12000-38000 UNITS PO CPEP
12000.0000 [IU] | ORAL_CAPSULE | Freq: Three times a day (TID) | ORAL | Status: DC
Start: 2023-12-06 — End: 2023-12-11
  Administered 2023-12-06 – 2023-12-11 (×15): 12000 [IU] via ORAL
  Filled 2023-12-06 (×16): qty 1

## 2023-12-06 MED ORDER — ONDANSETRON HCL 4 MG PO TABS: 4.0000 mg | ORAL_TABLET | Freq: Three times a day (TID) | ORAL | Status: DC | PRN

## 2023-12-06 MED ORDER — DOCUSATE SODIUM 100 MG PO CAPS
100.0000 mg | ORAL_CAPSULE | Freq: Every day | ORAL | Status: DC
  Administered 2023-12-07 – 2023-12-11 (×5): 100 mg via ORAL
  Filled 2023-12-06 (×5): qty 1

## 2023-12-06 MED ORDER — KETOROLAC TROMETHAMINE 0.5 % OP SOLN
2.0000 [drp] | Freq: Four times a day (QID) | OPHTHALMIC | Status: DC
  Administered 2023-12-06 – 2023-12-11 (×19): 2 [drp] via OPHTHALMIC
  Filled 2023-12-06: qty 3

## 2023-12-06 MED ORDER — VITAMIN B-12 1000 MCG PO TABS
2000.0000 ug | ORAL_TABLET | Freq: Every day | ORAL | Status: DC
  Administered 2023-12-07 – 2023-12-11 (×5): 2000 ug via ORAL
  Filled 2023-12-06 (×5): qty 2

## 2023-12-06 MED ORDER — ALUM & MAG HYDROXIDE-SIMETH 200-200-20 MG/5ML PO SUSP: 30.0000 mL | ORAL | Status: DC | PRN

## 2023-12-06 MED ORDER — ADULT MULTIVITAMIN W/MINERALS CH
1.0000 | ORAL_TABLET | Freq: Every day | ORAL | Status: DC
  Administered 2023-12-07 – 2023-12-11 (×5): 1 via ORAL
  Filled 2023-12-06 (×5): qty 1

## 2023-12-06 MED ORDER — VITAMIN D 25 MCG (1000 UNIT) PO TABS
1000.0000 [IU] | ORAL_TABLET | Freq: Every day | ORAL | Status: DC
Start: 2023-12-07 — End: 2023-12-11
  Administered 2023-12-07 – 2023-12-11 (×5): 1000 [IU] via ORAL
  Filled 2023-12-06 (×5): qty 1

## 2023-12-06 MED ORDER — NICOTINE 14 MG/24HR TD PT24
14.0000 mg | MEDICATED_PATCH | Freq: Every day | TRANSDERMAL | Status: DC
  Administered 2023-12-07 – 2023-12-11 (×5): 14 mg via TRANSDERMAL
  Filled 2023-12-06 (×5): qty 1

## 2023-12-06 MED ORDER — FLUTICASONE FUROATE-VILANTEROL 100-25 MCG/ACT IN AEPB
1.0000 | INHALATION_SPRAY | Freq: Every day | RESPIRATORY_TRACT | Status: DC
  Administered 2023-12-07 – 2023-12-11 (×5): 1 via RESPIRATORY_TRACT
  Filled 2023-12-06: qty 28

## 2023-12-06 MED ORDER — LORAZEPAM 2 MG/ML IJ SOLN: 2.0000 mg | Freq: Three times a day (TID) | INTRAMUSCULAR | Status: DC | PRN

## 2023-12-06 MED ORDER — HALOPERIDOL LACTATE 5 MG/ML IJ SOLN: 5.0000 mg | Freq: Three times a day (TID) | INTRAMUSCULAR | Status: DC | PRN

## 2023-12-06 MED ORDER — DIPHENHYDRAMINE HCL 50 MG/ML IJ SOLN: 50.0000 mg | Freq: Three times a day (TID) | INTRAMUSCULAR | Status: DC | PRN

## 2023-12-06 MED ORDER — ACETAMINOPHEN 325 MG PO TABS
650.0000 mg | ORAL_TABLET | Freq: Four times a day (QID) | ORAL | Status: DC | PRN
  Administered 2023-12-07 – 2023-12-11 (×10): 650 mg via ORAL
  Filled 2023-12-06 (×10): qty 2

## 2023-12-06 MED ORDER — PANCRELIPASE (LIP-PROT-AMYL) 12000-38000 UNITS PO CPEP: 12000.0000 [IU] | ORAL_CAPSULE | Freq: Three times a day (TID) | ORAL | Status: DC

## 2023-12-06 MED ORDER — TRAZODONE HCL 50 MG PO TABS
50.0000 mg | ORAL_TABLET | Freq: Every evening | ORAL | Status: DC | PRN
  Administered 2023-12-06 – 2023-12-10 (×5): 50 mg via ORAL
  Filled 2023-12-06 (×5): qty 1

## 2023-12-06 MED ORDER — PREDNISOLONE ACETATE 1 % OP SUSP
1.0000 [drp] | Freq: Four times a day (QID) | OPHTHALMIC | Status: DC
  Administered 2023-12-06 – 2023-12-11 (×19): 1 [drp] via OPHTHALMIC
  Filled 2023-12-06: qty 1

## 2023-12-06 MED ORDER — PANTOPRAZOLE SODIUM 40 MG PO TBEC
80.0000 mg | DELAYED_RELEASE_TABLET | Freq: Every day | ORAL | Status: DC
  Administered 2023-12-07 – 2023-12-11 (×5): 80 mg via ORAL
  Filled 2023-12-06 (×5): qty 2

## 2023-12-06 NOTE — Group Note (Signed)
 Date:  12/06/2023 Time:  5:34 PM  Group Topic/Focus:  Healthy Communication:   The focus of this group is to discuss communication, barriers to communication, as well as healthy ways to communicate with others.    Participation Level:  Did Not Attend   Arland Nutting 12/06/2023, 5:34 PM

## 2023-12-06 NOTE — Plan of Care (Signed)
 Problem: Education: Goal: Knowledge of General Education information will improve Description: Including pain rating scale, medication(s)/side effects and non-pharmacologic comfort measures 12/06/2023 1110 by Mathews Norleen POUR, RN Outcome: Adequate for Discharge 12/06/2023 0909 by Mathews Norleen POUR, RN Outcome: Progressing 12/06/2023 0907 by Mathews Norleen POUR, RN Outcome: Progressing   Problem: Health Behavior/Discharge Planning: Goal: Ability to manage health-related needs will improve 12/06/2023 1110 by Mathews Norleen POUR, RN Outcome: Adequate for Discharge 12/06/2023 0909 by Mathews Norleen POUR, RN Outcome: Progressing 12/06/2023 0907 by Mathews Norleen POUR, RN Outcome: Progressing   Problem: Clinical Measurements: Goal: Ability to maintain clinical measurements within normal limits will improve 12/06/2023 1110 by Mathews Norleen POUR, RN Outcome: Adequate for Discharge 12/06/2023 0909 by Mathews Norleen POUR, RN Outcome: Progressing 12/06/2023 0907 by Mathews Norleen POUR, RN Outcome: Progressing Goal: Will remain free from infection 12/06/2023 1110 by Mathews Norleen POUR, RN Outcome: Adequate for Discharge 12/06/2023 0909 by Mathews Norleen POUR, RN Outcome: Progressing 12/06/2023 0907 by Mathews Norleen POUR, RN Outcome: Progressing Goal: Diagnostic test results will improve 12/06/2023 1110 by Mathews Norleen POUR, RN Outcome: Adequate for Discharge 12/06/2023 0909 by Mathews Norleen POUR, RN Outcome: Progressing 12/06/2023 0907 by Mathews Norleen POUR, RN Outcome: Progressing Goal: Respiratory complications will improve 12/06/2023 1110 by Mathews Norleen POUR, RN Outcome: Adequate for Discharge 12/06/2023 [phone] by Mathews Norleen POUR, RN Outcome: Progressing 12/06/2023 0907 by Mathews Norleen POUR, RN Outcome: Progressing Goal: Cardiovascular complication will be avoided 12/06/2023 1110 by Mathews Norleen POUR, RN Outcome: Adequate for Discharge 12/06/2023 9090 by Mathews Norleen POUR, RN Outcome: Progressing 12/06/2023 0907 by Mathews Norleen POUR, RN Outcome: Progressing    Problem: Activity: Goal: Risk for activity intolerance will decrease 12/06/2023 1110 by Mathews Norleen POUR, RN Outcome: Adequate for Discharge 12/06/2023 0909 by Mathews Norleen POUR, RN Outcome: Progressing 12/06/2023 0907 by Mathews Norleen POUR, RN Outcome: Progressing   Problem: Nutrition: Goal: Adequate nutrition will be maintained 12/06/2023 1110 by Mathews Norleen POUR, RN Outcome: Adequate for Discharge 12/06/2023 0909 by Mathews Norleen POUR, RN Outcome: Progressing 12/06/2023 0907 by Mathews Norleen POUR, RN Outcome: Progressing   Problem: Coping: Goal: Level of anxiety will decrease 12/06/2023 1110 by Mathews Norleen POUR, RN Outcome: Adequate for Discharge 12/06/2023 0909 by Mathews Norleen POUR, RN Outcome: Progressing 12/06/2023 0907 by Mathews Norleen POUR, RN Outcome: Progressing   Problem: Elimination: Goal: Will not experience complications related to bowel motility 12/06/2023 1110 by Mathews Norleen POUR, RN Outcome: Adequate for Discharge 12/06/2023 0909 by Mathews Norleen POUR, RN Outcome: Progressing 12/06/2023 0907 by Mathews Norleen POUR, RN Outcome: Progressing Goal: Will not experience complications related to urinary retention 12/06/2023 1110 by Mathews Norleen POUR, RN Outcome: Adequate for Discharge 12/06/2023 0909 by Mathews Norleen POUR, RN Outcome: Progressing 12/06/2023 0907 by Mathews Norleen POUR, RN Outcome: Progressing   Problem: Pain Managment: Goal: General experience of comfort will improve and/or be controlled 12/06/2023 1110 by Mathews Norleen POUR, RN Outcome: Adequate for Discharge 12/06/2023 0909 by Mathews Norleen POUR, RN Outcome: Progressing 12/06/2023 0907 by Mathews Norleen POUR, RN Outcome: Progressing   Problem: Safety: Goal: Ability to remain free from injury will improve 12/06/2023 1110 by Mathews Norleen POUR, RN Outcome: Adequate for Discharge 12/06/2023 0909 by Mathews Norleen POUR, RN Outcome: Progressing 12/06/2023 0907 by Mathews Norleen POUR, RN Outcome: Progressing   Problem: Skin Integrity: Goal: Risk for impaired skin integrity will  decrease 12/06/2023 1110 by Mathews Norleen POUR, RN Outcome: Adequate for Discharge 12/06/2023 0909 by Mathews Norleen POUR, RN Outcome: Progressing 12/06/2023 0907 by Mathews,  Norleen POUR, RN Outcome: Progressing

## 2023-12-06 NOTE — Plan of Care (Signed)
   Problem: Education: Goal: Knowledge of General Education information will improve Description: Including pain rating scale, medication(s)/side effects and non-pharmacologic comfort measures Outcome: Progressing   Problem: Activity: Goal: Risk for activity intolerance will decrease Outcome: Progressing   Problem: Coping: Goal: Level of anxiety will decrease Outcome: Progressing

## 2023-12-06 NOTE — Discharge Summary (Signed)
 Physician Discharge Summary  Brent [name] FMW:[id number] DOB: [birth year] DOA: 12/03/2023  PCP: Pcp, No  Admit date: 12/03/2023  Discharge date: 12/06/2023  Admitted From:Home  Disposition:  Home  Recommendations for Outpatient Follow-up:  Follow up with [facility] Continue on medications as noted below Follow-up BMP in 1 week to reassess sodium levels  Home Health: None  Equipment/Devices: None  Discharge Condition:Stable  CODE STATUS: Full  Diet recommendation: Heart Healthy  Brief/Interim Summary: Brent [name] is a 53 y.o. 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 He was admitted for acute hyponatremia likely in the setting of chronic pancreatitis and also for evaluation for suicidal ideations.  His hyponatremia quickly resolved with initial administration of IV normal saline and was attributed to poor oral intake as well as his chronic pancreatitis.  Further workup has been unrevealing and he is now tolerating diet.  He has been assessed by psychiatry with recommendations for inpatient psychiatric hospitalization and will be transferred to [facility] at this time and has remained medically stable.  No other acute events or concerns noted.  Discharge Diagnoses:  Principal Problem:   MDD (major depressive disorder), recurrent severe, without psychosis (HCC) Active Problems:   Acute on chronic pancreatitis (HCC)   Anxiety   Acute hyponatremia  Principal discharge diagnosis: Acute hyponatremia likely in the setting of poor oral intake with noted chronic pancreatitis.  Suicidal ideation with history of MDD.  Discharge Instructions  Discharge Instructions     Diet - low sodium heart healthy   Complete by: As directed    Increase activity slowly   Complete by: As directed       Allergies as of  12/06/2023   No Known Allergies      Medication List     TAKE these medications    ALPRAZolam  1 MG tablet Commonly known as: XANAX  Take 1 tablet (1 mg total) by mouth 3 (three) times daily as needed for up to 15 days for anxiety (reordered home medication).   bismuth subsalicylate 262 MG/15ML suspension Commonly known as: PEPTO BISMOL Take 30 mLs by mouth every 6 (six) hours as needed for indigestion or diarrhea or loose stools.   Creon  24000-76000 units Cpep Generic drug: Pancrelipase  (Lip-Prot-Amyl) Take 1-3 capsules by mouth See admin instructions. Take 3 capsules by mouth three times daily with each meal and take 1 capsule by mouth daily with each snack.   cyanocobalamin 1000 MCG tablet Commonly known as: VITAMIN B12 Take 2,000 mcg by mouth daily.   docusate sodium 100 MG capsule Commonly known as: COLACE Take 100 mg by mouth daily.   feeding supplement (KATE FARMS STANDARD 1.4) Liqd liquid Take 325 mLs by mouth 2 (two) times daily between meals.   FLUoxetine 20 MG/5ML solution Commonly known as: PROZAC Take 2.5 mLs (10 mg total) by mouth daily.   GOODYS BODY PAIN PO Take 1 Package by mouth 4 (four) times daily as needed (pain).   ketorolac  0.5 % ophthalmic solution Commonly known as: ACULAR  Place 2 drops into both eyes 4 (four) times daily.   multivitamin with minerals Tabs tablet Take 1 tablet by mouth daily.   naphazoline-glycerin Soln Commonly known as: CLEAR EYES REDNESS Place 1-2 drops into both eyes 4 (four) times daily as needed for eye irritation.   naproxen 500 MG tablet Commonly known as: Naprosyn Take 1 tablet (500 mg total) by mouth 2 (  two) times daily with a meal.   nicotine  14 mg/24hr patch Commonly known as: NICODERM CQ  - dosed in mg/24 hours Place 1 patch (14 mg total) onto the skin daily.   omeprazole 40 MG capsule Commonly known as: PRILOSEC Take 40 mg by mouth at bedtime.   ondansetron  4 MG tablet Commonly known as: ZOFRAN  Take  4 mg by mouth every 8 (eight) hours as needed for nausea.   prednisoLONE  acetate 1 % ophthalmic suspension Commonly known as: PRED FORTE  Place 1 drop into both eyes 4 (four) times daily.   simethicone  80 MG chewable tablet Commonly known as: MYLICON Chew 1 tablet (80 mg total) by mouth every 6 (six) hours as needed for flatulence.   Symbicort 80-4.5 MCG/ACT inhaler Generic drug: budesonide-formoterol  Inhale 1 puff into the lungs 2 (two) times daily.   traZODone 100 MG tablet Commonly known as: DESYREL Take 1 tablet (100 mg total) by mouth at bedtime.   Vitamin D  High Potency 25 MCG (1000 UT) capsule Generic drug: Cholecalciferol  Take 1,000 Units by mouth daily.        No Known Allergies  Consultations: TTS   Procedures/Studies: CT ABDOMEN PELVIS W CONTRAST Result Date: 11/20/2023 CLINICAL DATA:  Left lower quadrant pain for several weeks, initial encounter EXAM: CT ABDOMEN AND PELVIS WITH CONTRAST TECHNIQUE: Multidetector CT imaging of the abdomen and pelvis was performed using the standard protocol following bolus administration of intravenous contrast. RADIATION DOSE REDUCTION: This exam was performed according to the departmental dose-optimization program which includes automated exposure control, adjustment of the mA and/or kV according to patient size and/or use of iterative reconstruction technique. CONTRAST:  OMNIPAQUE  IOHEXOL  300 MG/ML  SOLN COMPARISON:  02/21/2022 FINDINGS: Lower chest: Lung bases are free of acute infiltrate or sizable effusion. Hepatobiliary: No focal liver abnormality is seen. No gallstones, gallbladder wall thickening, or biliary dilatation. Pancreas: Pancreas demonstrates significant scattered calcifications increased when compared with the prior exam. Pancreatic ductal dilatation is seen although no discrete mass is noted. These changes are consistent with chronic pancreatitis. No acute component is noted. Spleen: Normal in size without focal  abnormality. Adrenals/Urinary Tract: Adrenal glands are within normal limits. Kidneys demonstrate a normal enhancement pattern bilaterally. No renal calculi or obstructive changes are seen. The bladder is decompressed. Stomach/Bowel: No obstructive or inflammatory changes of the colon are noted. Dense barium is noted throughout the colon. The appendix is not discretely visualized and may have been surgically removed. No inflammatory changes are noted. Small bowel appears within normal limits. Gastric lumen is decompressed. 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 Vascular/Lymphatic: Aortic atherosclerosis. No enlarged abdominal or pelvic lymph nodes. Reproductive: Prostate is unremarkable. Other: No abdominal wall hernia or abnormality. No abdominopelvic ascites. Musculoskeletal: No acute or significant osseous findings. IMPRESSION: Changes consistent with chronic pancreatitis relatively stable from the prior exam. No acute abnormality is identified. Electronically Signed   By: Oneil Devonshire M.D.   On: 11/20/2023 19:46     Discharge Exam: Vitals:   12/05/23 1952 12/06/23 0453  BP: 96/74 93/71  Pulse: 84 77  Resp: 14 16  Temp: 98.3 F (36.8 C) (!) 97.3 F (36.3 C)  SpO2: 96% 92%   Vitals:   12/05/23 1935 12/05/23 1952 12/06/23 0453 12/06/23 0632  BP: 98/74 96/74 93/71    Pulse: 89 84 77   Resp: 14 14 16    Temp: 98.1 F (36.7 C) 98.3 F (36.8 C) (!) 97.3 F (36.3 C)  TempSrc: Oral Oral Oral   SpO2: 96% 96% 92%   Weight:    45.5 kg  Height:        General: Pt is alert, awake, not in acute distress Cardiovascular: RRR, S1/S2 +, no rubs, no gallops Respiratory: CTA bilaterally, no wheezing, no rhonchi Abdominal: Soft, NT, ND, bowel sounds + Extremities: no edema, no cyanosis    The results of significant diagnostics from this hospitalization (including imaging, microbiology, ancillary and laboratory) are listed below for reference.      Microbiology: No results found for this or any previous visit (from the past 240 hours).   Labs: BNP (last 3 results) No results for input(s): BNP in the last 8760 hours. Basic Metabolic Panel: Recent Labs  Lab 12/03/23 0900 12/04/23 0407 12/05/23 0448 12/06/23 0434  NA 125* 134* 135 134*  K 3.4* 3.4* 4.0 3.9  CL 90* 98 102 100  CO2 25 28 24 24   GLUCOSE 89 75 125* 119*  BUN 5* <5* 6 7  CREATININE 0.64 0.62 0.69 0.62  CALCIUM  8.9 8.7* 8.6* 9.0  MG  --  2.0 2.0 2.2  PHOS  --   --  3.2 3.6   Liver Function Tests: Recent Labs  Lab 12/03/23 0900 12/04/23 0407  AST 17 16  ALT 11 11  ALKPHOS 74 68  BILITOT 0.4 0.3  PROT 6.1* 5.9*  ALBUMIN 4.0 3.8   Recent Labs  Lab 12/03/23 0900  LIPASE 77*   No results for input(s): AMMONIA in the last 168 hours. CBC: Recent Labs  Lab 12/03/23 0900 12/04/23 0407 12/05/23 0448  WBC 7.3 3.8* 4.3  NEUTROABS 4.9  --   --   HGB 12.5* 13.0 12.1*  HCT 34.9* 38.7* 36.6*  MCV 88.6 92.8 95.3  PLT 328 316 295   Cardiac Enzymes: No results for input(s): CKTOTAL, CKMB, CKMBINDEX, TROPONINI in the last 168 hours. BNP: Invalid input(s): POCBNP CBG: Recent Labs  Lab 12/05/23 1610 12/05/23 2046 12/05/23 2310 12/06/23 0451 12/06/23 0721  GLUCAP 120* 110* 116* 127* 122*   D-Dimer No results for input(s): DDIMER in the last 72 hours. Hgb A1c No results for input(s): HGBA1C in the last 72 hours. Lipid Profile No results for input(s): CHOL, HDL, LDLCALC, TRIG, CHOLHDL, LDLDIRECT in the last 72 hours. Thyroid  function studies No results for input(s): TSH, T4TOTAL, T3FREE, THYROIDAB in the last 72 hours.  Invalid input(s): FREET3 Anemia work up No results for input(s): VITAMINB12, FOLATE, FERRITIN, TIBC, IRON, RETICCTPCT in the last 72 hours. Urinalysis    Component Value Date/Time   COLORURINE YELLOW 11/20/2023 1842   APPEARANCEUR CLEAR 11/20/2023 1842   LABSPEC 1.010  11/20/2023 1842   PHURINE 5.0 11/20/2023 1842   GLUCOSEU NEGATIVE 11/20/2023 1842   HGBUR NEGATIVE 11/20/2023 1842   BILIRUBINUR NEGATIVE 11/20/2023 1842   KETONESUR 5 (A) 11/20/2023 1842   PROTEINUR NEGATIVE 11/20/2023 1842   UROBILINOGEN 0.2 11/06/2014 2345   NITRITE NEGATIVE 11/20/2023 1842   LEUKOCYTESUR NEGATIVE 11/20/2023 1842   Sepsis Labs Recent Labs  Lab 12/03/23 0900 12/04/23 0407 12/05/23 0448  WBC 7.3 3.8* 4.3   Microbiology No results found for this or any previous visit (from the past 240 hours).   Time coordinating discharge: 35 minutes  SIGNED:   Adron JONETTA Fairly, DO [facility] 12/06/2023, 9:22 AM  If 7PM-7AM, please contact night-coverage [email or web address]

## 2023-12-06 NOTE — Progress Notes (Signed)
 53 year old patient was admitted voluntarily from [facility]. Patient calm and cooperative. Denies SI/HI and AVH at current. Oriented to unit. All questions and concerns addressed at this time. No acute concerns at this time.

## 2023-12-06 NOTE — Plan of Care (Signed)

## 2023-12-06 NOTE — TOC Progression Note (Signed)
 Transition of Care [facility]) - Progression Note    Patient Details  Name: BRITIAN JENTZ MRN: [id number] Date of Birth: July 12, 1970  Transition of Care [facility] CM/SW Contact  Lorraine LILLETTE Fenton, LCSW Phone Number: 12/06/2023, 10:45 AM  Clinical Narrative:    CSW contacted in [facility] thread- pt needs to sign VC as agreeable to [facility]. CSW contacted [facility] peer who sent doc,  CSW forwarded to RN who agreed to facilitate process.  No further ICM needs.      Barriers to Discharge: Continued Medical Work up               Expected Discharge Plan and Services         Expected Discharge Date: 12/06/23                                     Social Drivers of Health (SDOH) Interventions SDOH Screenings   Food Insecurity: Food Insecurity Present (12/03/2023)  Housing: High Risk (12/03/2023)  Transportation Needs: Unmet Transportation Needs (12/03/2023)  Utilities: At Risk (12/03/2023)  Alcohol  Screen: Low Risk  (11/22/2023)  Financial Resource Strain: Low Risk  (02/24/2018)  Tobacco Use: High Risk (12/03/2023)    Readmission Risk Interventions     No data to display

## 2023-12-06 NOTE — Plan of Care (Signed)
 Newly admitted.   Problem: Education: Goal: Knowledge of [facility] Education information/materials will improve Outcome: Not Progressing Goal: Verbalization of understanding the information provided will improve Outcome: Not Progressing   Problem: Activity: Goal: Interest or engagement in activities will improve Outcome: Not Progressing

## 2023-12-07 DIAGNOSIS — F332 Major depressive disorder, recurrent severe without psychotic features: Secondary | ICD-10-CM | POA: Diagnosis not present

## 2023-12-07 MED ORDER — ALPRAZOLAM 0.5 MG PO TABS
1.0000 mg | ORAL_TABLET | Freq: Three times a day (TID) | ORAL | Status: DC | PRN
  Administered 2023-12-07 – 2023-12-11 (×11): 1 mg via ORAL
  Filled 2023-12-07 (×11): qty 2

## 2023-12-07 NOTE — Group Note (Signed)
 LCSW Group Therapy Note  Group Date: 12/07/2023 Start Time: 1405 End Time: 1446   Type of Therapy and Topic:  Group Therapy - Healthy vs Unhealthy Coping Skills  Participation Level:  Did Not Attend   Description of Group The focus of this group was to determine what unhealthy coping techniques typically are used by group members and what healthy coping techniques would be helpful in coping with various problems. Patients were guided in becoming aware of the differences between healthy and unhealthy coping techniques. Patients were asked to identify 2-3 healthy coping skills they would like to learn to use more effectively.  Therapeutic Goals Patients learned that coping is what human beings do all day long to deal with various situations in their lives Patients defined and discussed healthy vs unhealthy coping techniques Patients identified their preferred coping techniques and identified whether these were healthy or unhealthy Patients determined 2-3 healthy coping skills they would like to become more familiar with and use more often. Patients provided support and ideas to each other   Summary of Patient Progress: The patient did not attend group.   Therapeutic Modalities Cognitive Behavioral Therapy   Veralyn Lopp S Daniel Johndrow, LCSWA 12/07/2023  3:33 PM
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
## 2023-12-07 NOTE — Plan of Care (Signed)
   Problem: Coping: Goal: Ability to demonstrate self-control will improve Outcome: Progressing   Problem: Health Behavior/Discharge Planning: Goal: Compliance with treatment plan for underlying cause of condition will improve Outcome: Progressing

## 2023-12-07 NOTE — Group Note (Signed)
 Date:  12/07/2023 Time:  5:22 PM  Group Topic/Focus:    There is an intrinsic connection between music and nature, and when we tune in, we can find rhythms, melodies and inspiration all around us . Brent [name] has inspired cultures and musicians for centuries, shaping everything from traditional folk songs to [employer]. We present a view that places our ability to create and appreciate music at the center of what it means to be human. We argue that music is the sounds of human bodies, voices and minds - our personalities - moving in creative, story-making ways.  Participation Level:  Did Not Attend   Brent [name] Gavel 12/07/2023, 5:22 PM

## 2023-12-07 NOTE — Group Note (Signed)
 Date:  12/07/2023 Time:  10:34 AM  Group Topic/Focus:  Movement Therapy    Participation Level:  Did Not Attend    Norleen SHAUNNA Bias 12/07/2023, 10:34 AM

## 2023-12-07 NOTE — [facility] Suicide Risk Assessment (Signed)
 [facility] Admission Suicide Risk Assessment   Nursing information obtained from:  Patient Demographic factors:  Male, Caucasian, Unemployed, Low socioeconomic status Current Mental Status:  NA Loss Factors:  Financial problems / change in socioeconomic status, Decline in physical health Historical Factors:  NA Risk Reduction Factors:  Sense of responsibility to family  Total Time spent with patient: 30 minutes Principal Problem: MDD (major depressive disorder), recurrent episode, severe (HCC) Diagnosis:  Principal Problem:   MDD (major depressive disorder), recurrent episode, severe (HCC) Active Problems:   Major depressive disorder, recurrent severe without psychotic features (HCC)  Subjective Data: Brent [name] is a 53 y.o. 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  He was started on liquid Prozac and appeared to be tolerating this well and was otherwise stable for discharge at that time, however it appears that he was not able to obtain this medication.  Additionally, he has some suicidal thoughts and wanted to be evaluated Patient is admitted to [facility] unit with Q15 min safety monitoring. Multidisciplinary team approach is offered. Medication management; group/milieu therapy is offered.   Continued Clinical Symptoms:  Alcohol  Use Disorder Identification Test Final Score (AUDIT): 0 The Alcohol  Use Disorders Identification Test, Guidelines for Use in Primary Care, Second Edition.  World Science Writer [facility]). Score between 0-7:  no or low risk or alcohol  related problems. Score between 8-15:  moderate risk of alcohol  related problems. Score between 16-19:  high risk of alcohol  related problems. Score 20 or above:  warrants further diagnostic evaluation for alcohol  dependence and treatment.   CLINICAL  FACTORS:   Depression:   Impulsivity   Musculoskeletal: Strength & Muscle Tone: within normal limits Gait & Station: unsteady Patient leans: N/A  Psychiatric Specialty Exam:  Presentation  General Appearance:  Appropriate for Environment; Casual  Eye Contact: Fair  Speech: Normal Rate  Speech Volume: Decreased  Handedness: Right   Mood and Affect  Mood: Dysphoric; Anxious  Affect: Depressed; Flat   Thought Process  Thought Processes: Linear  Descriptions of Associations:Intact  Orientation:Full (Time, Place and Person)  Thought Content:Illogical  History of Schizophrenia/Schizoaffective disorder:No data recorded Duration of Psychotic Symptoms:No data recorded Hallucinations:Hallucinations: None  Ideas of Reference:None  Suicidal Thoughts:Suicidal Thoughts: Yes, Passive  Homicidal Thoughts:Homicidal Thoughts: No   Sensorium  Memory: Immediate Fair; Recent Fair; Remote Fair  Judgment: Impaired  Insight: Shallow   Executive Functions  Concentration: Poor  Attention Span: Poor  Recall: [employer] of Knowledge: Fair  Language: Fair   Psychomotor Activity  Psychomotor Activity: Psychomotor Activity: Normal   Assets  Assets: Communication Skills; Desire for Improvement; Physical Health; Resilience   Sleep  Sleep: Sleep: Fair    Physical Exam: Physical Exam ROS Blood pressure 111/75, pulse 76, temperature 98.4 F (36.9 C), resp. rate 16, height 5' 7 (1.702 m), weight 45.6 kg, SpO2 99%. Body mass index is 15.74 kg/m.   COGNITIVE FEATURES THAT CONTRIBUTE TO RISK:  None    SUICIDE RISK:   Minimal: No identifiable suicidal ideation.  Patients presenting with no risk factors but with morbid ruminations; may be classified as minimal risk based on the severity of the depressive symptoms  PLAN OF CARE: Patient is admitted to [facility] psych unit with Q15 min safety monitoring. Multidisciplinary team approach is offered.  Medication management; group/milieu therapy is offered.   I certify that inpatient services furnished can reasonably be expected  to improve the patient's condition.   Allyn Foil, MD 12/07/2023, 1:30 PM

## 2023-12-07 NOTE — Progress Notes (Signed)
 Medications given early per pt request.  Behavior:  Pleasant and cooperative.    Psych assessment: Flat affect.  Endorses anxiety.  Denies SI/HI and AVH.   Interaction / Group attendance:  Isolates to room.  Present in the milieu for meals.  Minimal interaction with peers and staff.  Medication/ PRNs: Compliant. PRN medication for anxiety given as ordered.  Pain: 4/10 in head  15 min checks in place for safety.

## 2023-12-07 NOTE — H&P (Signed)
 Psychiatric Admission Assessment Adult  Patient Identification: Brent [name] MRN:  [id number] Date of Evaluation:  12/07/2023 Chief Complaint:  MDD (major depressive disorder), recurrent episode, severe (HCC) [F33.2] Major depressive disorder, recurrent severe without psychotic features (HCC) [F33.2]   History of Present Illness: Brent [name] is a 53 y.o. 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  He was started on liquid Prozac and appeared to be tolerating this well and was otherwise stable for discharge at that time, however it appears that he was not able to obtain this medication.  Additionally, he has some suicidal thoughts and wanted to be evaluated. Patient is admitted to [facility] unit with Q15 min safety monitoring. Multidisciplinary team approach is offered. Medication management; group/milieu therapy is offered.   Patient is noted to be resting in bed.  He reports that he was unable to get his Xanax  filled from the pharmacy after his recent discharge from the hospital.  He reports that he could not get out of the house and requested his brother to pick up his prescriptions from the [facility] pharmacy in [place] but he was unable to.  He reports worsening anxiety and withdrawal from benzos causing night visions/night terrors.  He reports worsening depression, feeling helpless and hopeless with poor appetite, poor sleep, poor concentration.  He denies auditory/visual hallucinations.  He reports worsening suicidal ideation in the last few weeks since he was off of his medication.  He reports that his brother refused to bring his medication from the pharmacy.  He reports his brother lives with him and patient is trying to support his brother and return to his brother helping with household chores.  He denies current or  recent episodes of mania/hypomania. Total Time spent with patient: 1 hour Sleep  Sleep:Sleep: Fair  Past Psychiatric History:  Psychiatric History:  Information collected from patient/chart  Prev Dx/Sx: Depression, Tobacco Abuse, Agoraphobia, Panic Disorder Current Psych Provider:  Home Meds (current): Brent [name] [address] [place] Previous Med Trials: Alprazolam  1mg  po TID for anxiety Therapy: denies   Prior Psych Hospitalization: denies  Prior Self Harm: pt denies Prior Violence: pt denies   Family Psych History: pt is unsure of dx Family Hx suicide: denies   Social History:  Developmental Hx:no concerns Educational Hx: 12th grade diploma Occupational Hx: unemployed and received disability check for  Legal Hx: denies Living Situation: lives alone Spiritual Hx: unknown Access to weapons/lethal means: pt denies    Substance History Alcohol : not since 2012  History of DT's denies Tobacco: 1/2 pack daily since 53 years old Illicit drugs: he denies Prescription drug abuse: he denies Rehab hx: pt denies Is the patient at risk to self? No.  Has the patient been a risk to self in the past 6 months? No.  Has the patient been a risk to self within the distant past? No.  Is the patient a risk to others? No.  Has the patient been a risk to others in the past 6 months? No.  Has the patient been a risk to others within the distant past? No.   Columbia Scale:  Flowsheet Row Admission (Current) from 12/06/2023 in [facility] ED to Hosp-Admission (Discharged) from 12/03/2023 in [facility] UNIT Admission (Discharged) from 11/22/2023 in [facility] BE[facility] MED[facility]  C-SSRS RISK CATEGORY Low Risk Low Risk Low Risk  Past Medical History:  Past Medical History:  Diagnosis Date   Alcohol  use    Anxiety    Disabled due to panic attacks   Arthritis    Bilateral ureteral calculi    Borderline type 2 diabetes mellitus     BPH (benign prostatic hypertrophy)    Chronic back pain    Condylomata acuminata in male    multiple procedures --  penile, peri-rectal , perineum   DDD (degenerative disc disease), cervical    Depression    Dyspnea on exertion    Emphysematous COPD (HCC)    Epiretinal membrane (ERM) of both eyes    Fibromyalgia    GERD (gastroesophageal reflux disease)    Headache(784.0)    History of chronic pancreatitis    severeal adx for this /  2008  dx alcoholic pancreatitis   History of esophageal dilatation    History of hiatal hernia    History of kidney stones    History of panic attacks    History of sepsis    adx 05-01-2014--  urosepsis due to kidney stones obstruction/ hydronephrosis   History of suicidal ideation    2006   adx   Hypertension    was on medication for short time, htn was caused by prednisone  per pt   Multiple sclerosis    pt. states has 4 small brain lesions   Nephrolithiasis    bilateral    Retinal vasculitis    Uveitis     Past Surgical History:  Procedure Laterality Date   BIOPSY  11/12/2012   Procedure: GASTRIC AND ESOPHAGEAL BIOPSIES;  Surgeon: Lamar CHRISTELLA Hollingshead, MD;  Location: [facility];  Service: Endoscopy;;   CATARACT EXTRACTION W/ INTRAOCULAR LENS  IMPLANT, BILATERAL     COLONOSCOPY  10/08/2011   Jenkins:Normal colon/Anal condyloma without extension proximal to dentate line   CYSTOSCOPY W/ URETERAL STENT PLACEMENT Bilateral 05/01/2014   Procedure: CYSTOSCOPY WITH BILATERAL RETROGRADE PYELOGRAM; BILATERAL URETERAL STENT PLACEMENT;  Surgeon: Alm Fragmin, MD;  Location: [facility];  Service: Urology;  Laterality: Bilateral;   CYSTOSCOPY W/ URETERAL STENT REMOVAL Bilateral 06/06/2014   Procedure: CYSTOSCOPY WITH STENT REMOVAL;  Surgeon: Garnette Shack, MD;  Location: [facility];  Service: Urology;  Laterality: Bilateral;   CYSTOSCOPY WITH URETEROSCOPY  06/06/2014   Procedure: CYSTOSCOPY WITH URETEROSCOPY;  Surgeon: Garnette Shack, MD;  Location:  [facility];  Service: Urology;;   CYSTOSCOPY WITH URETEROSCOPY AND STENT PLACEMENT Bilateral 06/06/2014   Procedure: CYSTOSCOPY WITH J2 STENT EXTRACTION,,URETEROSCOPY WITH EXTRACTION OF STONES,;  Surgeon: Garnette Shack, MD;  Location: [facility];  Service: Urology;  Laterality: Bilateral;   ELECTROCAUTERY/ DESICCATION OF CONDYLOMA LESIONS  01-15-2008  &  12-29-2009   PENIS, PERI-RECTAL , PERINUEM   ESOPHAGOGASTRODUODENOSCOPY (EGD) WITH PROPOFOL  N/A 11/12/2012   MFM:Wnmfjo esophagus s/p  passage of a Maloney dilator and biopsy. Abnormal gastric mucosa-status post biopsy   FLEXIBLE BRONCHOSCOPY N/A 08/12/2012   Procedure: FLEXIBLE BRONCHOSCOPY;  Surgeon: Dallas LITTIE Gelineau, MD;  Location: [facility];  Service: Pulmonary;  Laterality: N/A;   MALONEY DILATION N/A 11/12/2012   Procedure: MALONEY DILATION (54mm);  Surgeon: Lamar CHRISTELLA Hollingshead, MD;  Location: [facility];  Service: Endoscopy;  Laterality: N/A;   MULTIPLE EXTRACTIONS WITH ALVEOLOPLASTY N/A 04/22/2014   Procedure: MULTIPLE EXTRACTIONS ( 2,3,5,6,7,8,9,10,11,13,14,15,21,28  WITH ALVEOLOPLASTY;  Surgeon: Glendia Primrose, DDS;  Location: [facility];  Service: Oral Surgery;  Laterality: N/A;   WISDOM TOOTH EXTRACTION     Family History:  Family History  Problem Relation Age of Onset   Hypertension Mother    Breast cancer Mother    Melanoma Mother    Stroke Mother    Lung cancer Father 57   Colon cancer Neg Hx    Colitis Neg Hx    Cirrhosis Neg Hx    Liver disease Neg Hx    Pancreatic cancer Neg Hx    Pancreatitis Neg Hx    Anesthesia problems Neg Hx    Hypotension Neg Hx    Malignant hyperthermia Neg Hx    Pseudochol deficiency Neg Hx     Social History:  Social History   Substance and Sexual Activity  Alcohol  Use Not Currently     Social History   Substance and Sexual Activity  Drug Use No      Allergies:  No Known Allergies Lab Results:  Results for orders placed or performed during the hospital encounter  of 12/03/23 (from the past 48 hours)  Glucose, capillary     Status: Abnormal   Collection Time: 12/05/23  4:10 PM  Result Value Ref Range   Glucose-Capillary 120 (H) 70 - 99 mg/dL    Comment: Glucose reference range applies only to samples taken after fasting for at least 8 hours.  Glucose, capillary     Status: Abnormal   Collection Time: 12/05/23  8:46 PM  Result Value Ref Range   Glucose-Capillary 110 (H) 70 - 99 mg/dL    Comment: Glucose reference range applies only to samples taken after fasting for at least 8 hours.  Glucose, capillary     Status: Abnormal   Collection Time: 12/05/23 11:10 PM  Result Value Ref Range   Glucose-Capillary 116 (H) 70 - 99 mg/dL    Comment: Glucose reference range applies only to samples taken after fasting for at least 8 hours.  Magnesium      Status: None   Collection Time: 12/06/23  4:34 AM  Result Value Ref Range   Magnesium  2.2 1.7 - 2.4 mg/dL    Comment: Performed at [facility], [address] [place], [place] [address]  Phosphorus     Status: None   Collection Time: 12/06/23  4:34 AM  Result Value Ref Range   Phosphorus 3.6 2.5 - 4.6 mg/dL    Comment: Performed at [facility], [address], [place], [place] [address]  Basic metabolic panel with GFR     Status: Abnormal   Collection Time: 12/06/23  4:34 AM  Result Value Ref Range   Sodium 134 (L) 135 - 145 mmol/L   Potassium 3.9 3.5 - 5.1 mmol/L   Chloride 100 98 - 111 mmol/L   CO2 24 22 - 32 mmol/L   Glucose, Bld 119 (H) 70 - 99 mg/dL    Comment: Glucose reference range applies only to samples taken after fasting for at least 8 hours.   BUN 7 6 - 20 mg/dL   Creatinine, Ser 9.37 0.61 - 1.24 mg/dL   Calcium  9.0 8.9 - 10.3 mg/dL   GFR, Estimated >39 >39 mL/min    Comment: (NOTE) Calculated using the CKD-EPI Creatinine Equation (2021)    Anion gap 10 5 - 15    Comment: Performed at [facility], [address] [place], [place] [address]  Glucose, capillary     Status: Abnormal    Collection Time: 12/06/23  4:51 AM  Result Value Ref Range   Glucose-Capillary 127 (H) 70 - 99 mg/dL    Comment: Glucose reference range applies only to samples taken  after fasting for at least 8 hours.  Glucose, capillary     Status: Abnormal   Collection Time: 12/06/23  7:21 AM  Result Value Ref Range   Glucose-Capillary 122 (H) 70 - 99 mg/dL    Comment: Glucose reference range applies only to samples taken after fasting for at least 8 hours.  Glucose, capillary     Status: Abnormal   Collection Time: 12/06/23 11:05 AM  Result Value Ref Range   Glucose-Capillary 140 (H) 70 - 99 mg/dL    Comment: Glucose reference range applies only to samples taken after fasting for at least 8 hours.  Resp panel by RT-PCR (RSV, Flu A&B, Covid) Anterior Nasal Swab     Status: None   Collection Time: 12/06/23 12:14 PM   Specimen: Anterior Nasal Swab  Result Value Ref Range   SARS Coronavirus 2 by RT PCR NEGATIVE NEGATIVE    Comment: (NOTE) SARS-CoV-2 target nucleic acids are NOT DETECTED.  The SARS-CoV-2 RNA is generally detectable in upper respiratory specimens during the acute phase of infection. The lowest concentration of SARS-CoV-2 viral copies this assay can detect is 138 copies/mL. A negative result does not preclude SARS-Cov-2 infection and should not be used as the sole basis for treatment or other patient management decisions. A negative result may occur with  improper specimen collection/handling, submission of specimen other than nasopharyngeal swab, presence of viral mutation(s) within the areas targeted by this assay, and inadequate number of viral copies(<138 copies/mL). A negative result must be combined with clinical observations, patient history, and epidemiological information. The expected result is Negative.  Fact Sheet for Patients:  bloggercourse.com  Fact Sheet for Healthcare Providers:  seriousbroker.it  This test is no  t yet approved or cleared by the [place]  FDA and  has been authorized for detection and/or diagnosis of SARS-CoV-2 by FDA under an Emergency Use Authorization (EUA). This EUA will remain  in effect (meaning this test can be used) for the duration of the COVID-19 declaration under Section 564(b)(1) of the Act, 21 U.S.C.section 360bbb-3(b)(1), unless the authorization is terminated  or revoked sooner.       Influenza A by PCR NEGATIVE NEGATIVE   Influenza B by PCR NEGATIVE NEGATIVE    Comment: (NOTE) The Xpert Xpress SARS-CoV-2/FLU/RSV plus assay is intended as an aid in the diagnosis of influenza from Nasopharyngeal swab specimens and should not be used as a sole basis for treatment. Nasal washings and aspirates are unacceptable for Xpert Xpress SARS-CoV-2/FLU/RSV testing.  Fact Sheet for Patients: bloggercourse.com  Fact Sheet for Healthcare Providers: seriousbroker.it  This test is not yet approved or cleared by the [place]  FDA and has been authorized for detection and/or diagnosis of SARS-CoV-2 by FDA under an Emergency Use Authorization (EUA). This EUA will remain in effect (meaning this test can be used) for the duration of the COVID-19 declaration under Section 564(b)(1) of the Act, 21 U.S.C. section 360bbb-3(b)(1), unless the authorization is terminated or revoked.     Resp Syncytial Virus by PCR NEGATIVE NEGATIVE    Comment: (NOTE) Fact Sheet for Patients: bloggercourse.com  Fact Sheet for Healthcare Providers: seriousbroker.it  This test is not yet approved or cleared by the [place]  FDA and has been authorized for detection and/or diagnosis of SARS-CoV-2 by FDA under an Emergency Use Authorization (EUA). This EUA will remain in effect (meaning this test can be used) for the duration of the COVID-19 declaration under Section 564(b)(1) of the Act, 21  U.S.C. section 360bbb-3(b)(1), unless  the authorization is terminated or revoked.  Performed at [facility], [address], [place], [place] [address]     Blood Alcohol  level:  Lab Results  Component Value Date   [facility] <10 06/08/2019   ETH <10 11/27/2017    Metabolic Disorder Labs:  Lab Results  Component Value Date   HGBA1C 5.2 06/09/2019   MPG 102.54 06/09/2019   No results found for: PROLACTIN Lab Results  Component Value Date   CHOL 93 11/27/2017   TRIG 94 02/25/2018   HDL 29 (L) 11/27/2017   CHOLHDL 3.2 11/27/2017   VLDL 28 11/27/2017   LDLCALC 36 11/27/2017    Current Medications: Current Facility-Administered Medications  Medication Dose Route Frequency Provider Last Rate Last Admin   acetaminophen  (TYLENOL ) tablet 650 mg  650 mg Oral Q6H PRN Bobbitt, Shalon E, NP   650 mg at 12/07/23 0411   ALPRAZolam  (XANAX ) tablet 1 mg  1 mg Oral TID PRN Lucah Petta, MD       alum & mag hydroxide-simeth (MAALOX/MYLANTA) 200-200-20 MG/5ML suspension 30 mL  30 mL Oral Q4H PRN Ezzard Staci SAILOR, NP       cholecalciferol  (VITAMIN D3) 25 MCG (1000 UNIT) tablet 1,000 Units  1,000 Units Oral Daily Bobbitt, Shalon E, NP   1,000 Units at 12/07/23 0824   cyanocobalamin (VITAMIN B12) tablet 2,000 mcg  2,000 mcg Oral Daily Bobbitt, Shalon E, NP   2,000 mcg at 12/07/23 0823   haloperidol (HALDOL) tablet 5 mg  5 mg Oral TID PRN Lewis, Tanika N, NP       And   diphenhydrAMINE (BENADRYL) capsule 50 mg  50 mg Oral TID PRN Ezzard Staci SAILOR, NP       haloperidol lactate (HALDOL) injection 5 mg  5 mg Intramuscular TID PRN Lewis, Tanika N, NP       And   diphenhydrAMINE (BENADRYL) injection 50 mg  50 mg Intramuscular TID PRN Ezzard Staci SAILOR, NP       And   LORazepam  (ATIVAN ) injection 2 mg  2 mg Intramuscular TID PRN Ezzard Staci SAILOR, NP       haloperidol lactate (HALDOL) injection 10 mg  10 mg Intramuscular TID PRN Lewis, Tanika N, NP       And   diphenhydrAMINE (BENADRYL) injection 50 mg  50  mg Intramuscular TID PRN Ezzard Staci SAILOR, NP       And   LORazepam  (ATIVAN ) injection 2 mg  2 mg Intramuscular TID PRN Ezzard Staci SAILOR, NP       docusate sodium (COLACE) capsule 100 mg  100 mg Oral Daily Bobbitt, Shalon E, NP   100 mg at 12/07/23 0823   FLUoxetine (PROZAC) capsule 10 mg  10 mg Oral Daily Bobbitt, Shalon E, NP   10 mg at 12/07/23 9176   fluticasone  furoate-vilanterol (BREO ELLIPTA) 100-25 MCG/ACT 1 puff  1 puff Inhalation Daily Bobbitt, Shalon E, NP   1 puff at 12/07/23 0720   ketorolac  (ACULAR ) 0.5 % ophthalmic solution 2 drop  2 drop Both Eyes QID Bobbitt, Shalon E, NP   2 drop at 12/07/23 0816   lipase/protease/amylase (CREON ) capsule 12,000 Units  12,000 Units Oral TID AC Motley-Mangrum, Jadeka A, PMHNP   12,000 Units at 12/07/23 1125   multivitamin with minerals tablet 1 tablet  1 tablet Oral Daily Bobbitt, Shalon E, NP   1 tablet at 12/07/23 0823   naproxen (NAPROSYN) tablet 500 mg  500 mg Oral BID WC Bobbitt, Shalon E, NP  500 mg at 12/07/23 0719   nicotine  (NICODERM CQ  - dosed in mg/24 hours) patch 14 mg  14 mg Transdermal Daily Bobbitt, Shalon E, NP   14 mg at 12/07/23 9177   ondansetron  (ZOFRAN ) tablet 4 mg  4 mg Oral Q8H PRN Bobbitt, Shalon E, NP       pantoprazole  (PROTONIX ) EC tablet 80 mg  80 mg Oral Daily Bobbitt, Shalon E, NP   80 mg at 12/07/23 9175   prednisoLONE  acetate (PRED FORTE ) 1 % ophthalmic suspension 1 drop  1 drop Both Eyes QID Bobbitt, Shalon E, NP   1 drop at 12/07/23 0816   simethicone  (MYLICON) chewable tablet 80 mg  80 mg Oral Q6H PRN Bobbitt, Shalon E, NP       traZODone (DESYREL) tablet 50 mg  50 mg Oral QHS PRN Bobbitt, Shalon E, NP   50 mg at 12/06/23 2102   PTA Medications: Medications Prior to Admission  Medication Sig Dispense Refill Last Dose/Taking   ALPRAZolam  (XANAX ) 1 MG tablet Take 1 tablet (1 mg total) by mouth 3 (three) times daily as needed for up to 15 days for anxiety (reordered home medication). 45 tablet 0     Aspirin-Acetaminophen  (GOODYS BODY PAIN PO) Take 1 Package by mouth 4 (four) times daily as needed (pain).      bismuth subsalicylate (PEPTO BISMOL) 262 MG/15ML suspension Take 30 mLs by mouth every 6 (six) hours as needed for indigestion or diarrhea or loose stools.      Cholecalciferol  (VITAMIN D  HIGH POTENCY) 25 MCG (1000 UT) capsule Take 1,000 Units by mouth daily.      CREON  24000-76000 units CPEP Take 1-3 capsules by mouth See admin instructions. Take 3 capsules by mouth three times daily with each meal and take 1 capsule by mouth daily with each snack.      cyanocobalamin (VITAMIN B12) 1000 MCG tablet Take 2,000 mcg by mouth daily.      docusate sodium (COLACE) 100 MG capsule Take 100 mg by mouth daily.      FLUoxetine (PROZAC) 20 MG/5ML solution Take 2.5 mLs (10 mg total) by mouth daily. (Patient not taking: Reported on 12/04/2023) 120 mL 0    ketorolac  (ACULAR ) 0.5 % ophthalmic solution Place 2 drops into both eyes 4 (four) times daily.      Multiple Vitamin (MULTIVITAMIN WITH MINERALS) TABS tablet Take 1 tablet by mouth daily. (Patient not taking: Reported on 12/04/2023) 30 tablet 0    naphazoline-glycerin (CLEAR EYES REDNESS) SOLN Place 1-2 drops into both eyes 4 (four) times daily as needed for eye irritation. 100 mL 0    naproxen (NAPROSYN) 500 MG tablet Take 1 tablet (500 mg total) by mouth 2 (two) times daily with a meal. 30 tablet 0    nicotine  (NICODERM CQ  - DOSED IN MG/24 HOURS) 14 mg/24hr patch Place 1 patch (14 mg total) onto the skin daily. (Patient not taking: Reported on 12/04/2023) 28 patch 0    Nutritional Supplements (FEEDING SUPPLEMENT, KATE FARMS STANDARD 1.4,) LIQD liquid Take 325 mLs by mouth 2 (two) times daily between meals. 19500 mL 0    omeprazole (PRILOSEC) 40 MG capsule Take 40 mg by mouth at bedtime.      ondansetron  (ZOFRAN ) 4 MG tablet Take 4 mg by mouth every 8 (eight) hours as needed for nausea.       prednisoLONE  acetate (PRED FORTE ) 1 % ophthalmic suspension  Place 1 drop into both eyes 4 (four) times daily.  simethicone  (MYLICON) 80 MG chewable tablet Chew 1 tablet (80 mg total) by mouth every 6 (six) hours as needed for flatulence. 30 tablet 0    SYMBICORT 80-4.5 MCG/ACT inhaler Inhale 1 puff into the lungs 2 (two) times daily.      traZODone (DESYREL) 100 MG tablet Take 1 tablet (100 mg total) by mouth at bedtime. 30 tablet 0     Psychiatric Specialty Exam:  Presentation  General Appearance:  Appropriate for Environment; Casual  Eye Contact: Fair  Speech: Normal Rate  Speech Volume: Decreased    Mood and Affect  Mood: Dysphoric; Anxious  Affect: Depressed; Flat   Thought Process  Thought Processes: Linear  Descriptions of Associations:Intact  Orientation:Full (Time, Place and Person)  Thought Content:Illogical  Hallucinations:Hallucinations: None  Ideas of Reference:None  Suicidal Thoughts:Suicidal Thoughts: Yes, Passive  Homicidal Thoughts:Homicidal Thoughts: No   Sensorium  Memory: Immediate Fair; Recent Fair; Remote Fair  Judgment: Impaired  Insight: Shallow   Executive Functions  Concentration: Poor  Attention Span: Poor  Recall: [employer] of Knowledge: Fair  Language: Fair   Psychomotor Activity  Psychomotor Activity: Psychomotor Activity: Normal   Assets  Assets: Communication Skills; Desire for Improvement; Physical Health; Resilience    Musculoskeletal: Strength & Muscle Tone: decreased Gait & Station: unsteady  Physical Exam: Physical Exam Vitals and nursing note reviewed.  HENT:     Head: Normocephalic.     Mouth/Throat:     Mouth: Mucous membranes are moist.  Cardiovascular:     Rate and Rhythm: Normal rate.  Pulmonary:     Effort: Pulmonary effort is normal.  Abdominal:     General: Abdomen is flat.  Neurological:     Mental Status: He is alert.    Review of Systems  Constitutional: Negative.   HENT: Negative.    Eyes: Negative.    Cardiovascular: Negative.   Skin: Negative.    Blood pressure 111/75, pulse 76, temperature 98.4 F (36.9 C), resp. rate 16, height 5' 7 (1.702 m), weight 45.6 kg, SpO2 99%. Body mass index is 15.74 kg/m.  Principal Diagnosis: MDD (major depressive disorder), recurrent episode, severe (HCC) Diagnosis:  Principal Problem:   MDD (major depressive disorder), recurrent episode, severe (HCC) Active Problems:   Major depressive disorder, recurrent severe without psychotic features (HCC)   Clinical Decision Making: Patient is currently admitted for worsening depression and anxiety and suicidal ideation in the context of having access to his prescribed medications including Prozac and Xanax  that worsened the anxiety.  Patient will be monitored closely for safety concerns.  Treatment Plan Summary:  Safety and Monitoring:             -- Voluntary admission to inpatient psychiatric unit for safety, stabilization and treatment             -- Daily contact with patient to assess and evaluate symptoms and progress in treatment             -- Patient's case to be discussed in multi-disciplinary team meeting             -- Observation Level: q15 minute checks             -- Vital signs:  q12 hours             -- Precautions: suicide, elopement, and assault   2. Psychiatric Diagnoses and Treatment:              Restart home medications Prozac 10 mg daily and  Xanax  1 mg 3 times daily as needed anxiety that patient has been taking for many years for his agoraphobia, social anxiety     -- The risks/benefits/side-effects/alternatives to this medication were discussed in detail with the patient and time was given for questions. The patient consents to medication trial.                -- Metabolic profile and EKG monitoring obtained while on an atypical antipsychotic (BMI: Lipid Panel: HbgA1c: QTc:)              -- Encouraged patient to participate in unit milieu and in scheduled group therapies                             3. Medical Issues Being Addressed:      4. Discharge Planning:              -- Social work and case management to assist with discharge planning and identification of hospital follow-up needs prior to discharge             -- Estimated LOS: 5-7 days             -- Discharge Concerns: Need to establish a safety plan; Medication compliance and effectiveness             -- Discharge Goals: Return home with outpatient referrals follow ups  Physician Treatment Plan for Primary Diagnosis: MDD (major depressive disorder), recurrent episode, severe (HCC) Long Term Goal(s): Improvement in symptoms so as ready for discharge  Short Term Goals: Ability to identify changes in lifestyle to reduce recurrence of condition will improve, Ability to verbalize feelings will improve, Ability to disclose and discuss suicidal ideas, and Ability to demonstrate self-control will improve  Physician Treatment Plan for Secondary Diagnosis: Principal Problem:   MDD (major depressive disorder), recurrent episode, severe (HCC) Active Problems:   Major depressive disorder, recurrent severe without psychotic features (HCC)  Long Term Goal(s): Improvement in symptoms so as ready for discharge  Short Term Goals: Ability to identify changes in lifestyle to reduce recurrence of condition will improve, Ability to verbalize feelings will improve, Ability to disclose and discuss suicidal ideas, Ability to demonstrate self-control will improve, and Ability to identify and develop effective coping behaviors will improve  I certify that inpatient services furnished can reasonably be expected to improve the patient's condition.    Pacer Dorn, MD 10/26/20251:32 PM

## 2023-12-07 NOTE — Group Note (Signed)
 Date:  12/07/2023 Time:  9:15 PM  Group Topic/Focus:  Wrap-Up Group:   The focus of this group is to help patients review their daily goal of treatment and discuss progress on daily workbooks.    Participation Level:  Did Not Attend  Participation Quality:     Affect:     Cognitive:     Insight: None  Engagement in Group:  None  Modes of Intervention:     Additional Comments:    Brent [name] 12/07/2023, 9:15 PM

## 2023-12-08 DIAGNOSIS — F332 Major depressive disorder, recurrent severe without psychotic features: Secondary | ICD-10-CM | POA: Diagnosis not present

## 2023-12-08 MED ORDER — KATE FARMS STANDARD 1.4 PO LIQD
325.0000 mL | Freq: Two times a day (BID) | ORAL | Status: DC
  Administered 2023-12-08 – 2023-12-11 (×6): 325 mL via ORAL
  Filled 2023-12-08: qty 325

## 2023-12-08 NOTE — Progress Notes (Signed)
 [facility] MD Progress Note  12/08/2023 3:29 PM Brent [name]  MRN:  [id number]  Brent [name] is a 53 y.o. 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  He was started on liquid Prozac and appeared to be tolerating this well and was otherwise stable for discharge at that time, however it appears that he was not able to obtain this medication.  Additionally, he has some suicidal thoughts and wanted to be evaluated. Patient is admitted to [facility] unit with Q15 min safety monitoring. Multidisciplinary team approach is offered. Medication management; group/milieu therapy is offered.  Subjective:  Chart reviewed, case discussed in multidisciplinary meeting, patient seen during rounds.   Patient met with the treatment team today and reports needing transportation to go for his appointments.  He was educated about needing to go to pharmacies to pick up the controlled substance prescription.  He reports improvement in his depression and anxiety since he was put back on medications including Prozac.  Provider did educate him about titrating and optimizing the dose of Prozac to help further with the mood component.  Patient agreed with the plan.  He denies SI/HI/plan and denies auditory/visual hallucinations. Sleep: Fair  Appetite:  Fair  Past Psychiatric History: see h&P Family History:  Family History  Problem Relation Age of Onset   Hypertension Mother    Breast cancer Mother    Melanoma Mother    Stroke Mother    Lung cancer Father 68   Colon cancer Neg Hx    Colitis Neg Hx    Cirrhosis Neg Hx    Liver disease Neg Hx    Pancreatic cancer Neg Hx    Pancreatitis Neg Hx    Anesthesia problems Neg Hx    Hypotension Neg Hx    Malignant hyperthermia Neg Hx    Pseudochol deficiency Neg Hx    Social History:  Social History    Substance and Sexual Activity  Alcohol  Use Not Currently     Social History   Substance and Sexual Activity  Drug Use No    Social History   Socioeconomic History   Marital status: Single    Spouse name: Not on file   Number of children: 0   Years of education: Not on file   Highest education level: Not on file  Occupational History   Occupation: disabled    Comment: anxiety  Tobacco Use   Smoking status: Every Day    Current packs/day: 1.00    Average packs/day: 1 pack/day for 21.7 years (21.7 ttl pk-yrs)    Types: Cigarettes, Cigars    Start date: 03/2002   Smokeless tobacco: Never   Tobacco comments:    1 pack per week now as of 02/24/2018   Vaping Use   Vaping status: Never Used  Substance and Sexual Activity   Alcohol  use: Not Currently   Drug use: No   Sexual activity: Not on file  Other Topics Concern   Not on file  Social History Narrative   Right handed   Caffeine use: mostly water /OJ, tea sometimes.    Social Drivers of Corporate Investment Banker Strain: Low Risk  (02/24/2018)   Overall Financial Resource Strain (CARDIA)    Difficulty of Paying Living Expenses: Not very hard  Food Insecurity: No Food Insecurity (12/06/2023)   Hunger Vital Sign    Worried About Running Out of  Food in the Last Year: Never true    Ran Out of Food in the Last Year: Never true  Recent Concern: Food Insecurity - Food Insecurity Present (12/03/2023)   Hunger Vital Sign    Worried About Running Out of Food in the Last Year: Often true    Ran Out of Food in the Last Year: Often true  Transportation Needs: Unmet Transportation Needs (12/06/2023)   PRAPARE - Administrator, Civil Service (Medical): Yes    Lack of Transportation (Non-Medical): Yes  Physical Activity: Not on file  Stress: Not on file  Social Connections: Socially Isolated (12/06/2023)   Social Connection and Isolation Panel    Frequency of Communication with Friends and Family: Once a week     Frequency of Social Gatherings with Friends and Family: Once a week    Attends Religious Services: Never    Database Administrator or Organizations: No    Attends Engineer, Structural: Never    Marital Status: Never married   Past Medical History:  Past Medical History:  Diagnosis Date   Alcohol  use    Anxiety    Disabled due to panic attacks   Arthritis    Bilateral ureteral calculi    Borderline type 2 diabetes mellitus    BPH (benign prostatic hypertrophy)    Chronic back pain    Condylomata acuminata in male    multiple procedures --  penile, peri-rectal , perineum   DDD (degenerative disc disease), cervical    Depression    Dyspnea on exertion    Emphysematous COPD (HCC)    Epiretinal membrane (ERM) of both eyes    Fibromyalgia    GERD (gastroesophageal reflux disease)    Headache(784.0)    History of chronic pancreatitis    severeal adx for this /  2008  dx alcoholic pancreatitis   History of esophageal dilatation    History of hiatal hernia    History of kidney stones    History of panic attacks    History of sepsis    adx 05-01-2014--  urosepsis due to kidney stones obstruction/ hydronephrosis   History of suicidal ideation    2006   adx   Hypertension    was on medication for short time, htn was caused by prednisone  per pt   Multiple sclerosis    pt. states has 4 small brain lesions   Nephrolithiasis    bilateral    Retinal vasculitis    Uveitis     Past Surgical History:  Procedure Laterality Date   BIOPSY  11/12/2012   Procedure: GASTRIC AND ESOPHAGEAL BIOPSIES;  Surgeon: Lamar CHRISTELLA Hollingshead, MD;  Location: [facility];  Service: Endoscopy;;   CATARACT EXTRACTION W/ INTRAOCULAR LENS  IMPLANT, BILATERAL     COLONOSCOPY  10/08/2011   Jenkins:Normal colon/Anal condyloma without extension proximal to dentate line   CYSTOSCOPY W/ URETERAL STENT PLACEMENT Bilateral 05/01/2014   Procedure: CYSTOSCOPY WITH BILATERAL RETROGRADE PYELOGRAM; BILATERAL URETERAL STENT  PLACEMENT;  Surgeon: Alm Fragmin, MD;  Location: [facility];  Service: Urology;  Laterality: Bilateral;   CYSTOSCOPY W/ URETERAL STENT REMOVAL Bilateral 06/06/2014   Procedure: CYSTOSCOPY WITH STENT REMOVAL;  Surgeon: Garnette Shack, MD;  Location[facility];  Service: Urology;  Laterality: Bilateral;   CYSTOSCOPY WITH URETEROSCOPY  06/06/2014   Procedure: CYSTOSCOPY WITH URETEROSCOPY;  Surgeon: Garnette Shack, MD;  Location: [facility];  Service: Urology;;   CYSTOSCOPY WITH URETEROSCOPY AND STENT PLACEMENT Bilateral 06/06/2014  Procedure: CYSTOSCOPY WITH J2 STENT EXTRACTION,,URETEROSCOPY WITH EXTRACTION OF STONES,;  Surgeon: Garnette Shack, MD;  Location: [facility];  Service: Urology;  Laterality: Bilateral;   ELECTROCAUTERY/ DESICCATION OF CONDYLOMA LESIONS  01-15-2008  &  12-29-2009   PENIS, PERI-RECTAL , PERINUEM   ESOPHAGOGASTRODUODENOSCOPY (EGD) WITH PROPOFOL  N/A 11/12/2012   MFM:Wnmfjo esophagus s/p  passage of a Maloney dilator and biopsy. Abnormal gastric mucosa-status post biopsy   FLEXIBLE BRONCHOSCOPY N/A 08/12/2012   Procedure: FLEXIBLE BRONCHOSCOPY;  Surgeon: Dallas LITTIE Gelineau, MD;  Location: [facility];  Service: Pulmonary;  Laterality: N/A;   MALONEY DILATION N/A 11/12/2012   Procedure: MALONEY DILATION (54mm);  Surgeon: Lamar CHRISTELLA Hollingshead, MD;  Location: [facility];  Service: Endoscopy;  Laterality: N/A;   MULTIPLE EXTRACTIONS WITH ALVEOLOPLASTY N/A 04/22/2014   Procedure: MULTIPLE EXTRACTIONS ( 2,3,5,6,7,8,9,10,11,13,14,15,21,28  WITH ALVEOLOPLASTY;  Surgeon: Glendia Primrose, DDS;  Location: [facility];  Service: Oral Surgery;  Laterality: N/A;   WISDOM TOOTH EXTRACTION      Current Medications: Current Facility-Administered Medications  Medication Dose Route Frequency Provider Last Rate Last Admin   acetaminophen  (TYLENOL ) tablet 650 mg  650 mg Oral Q6H PRN Bobbitt, Shalon E, NP   650 mg at 12/08/23 0409   ALPRAZolam  (XANAX ) tablet 1 mg  1 mg  Oral TID PRN Nakul Avino, MD   1 mg at 12/08/23 0918   alum & mag hydroxide-simeth (MAALOX/MYLANTA) 200-200-20 MG/5ML suspension 30 mL  30 mL Oral Q4H PRN Lewis, Tanika N, NP       cholecalciferol  (VITAMIN D3) 25 MCG (1000 UNIT) tablet 1,000 Units  1,000 Units Oral Daily Bobbitt, Shalon E, NP   1,000 Units at 12/08/23 0918   cyanocobalamin (VITAMIN B12) tablet 2,000 mcg  2,000 mcg Oral Daily Bobbitt, Shalon E, NP   2,000 mcg at 12/08/23 0918   haloperidol (HALDOL) tablet 5 mg  5 mg Oral TID PRN Lewis, Tanika N, NP       And   diphenhydrAMINE (BENADRYL) capsule 50 mg  50 mg Oral TID PRN Ezzard Staci SAILOR, NP       haloperidol lactate (HALDOL) injection 5 mg  5 mg Intramuscular TID PRN Lewis, Tanika N, NP       And   diphenhydrAMINE (BENADRYL) injection 50 mg  50 mg Intramuscular TID PRN Ezzard Staci SAILOR, NP       And   LORazepam  (ATIVAN ) injection 2 mg  2 mg Intramuscular TID PRN Ezzard Staci SAILOR, NP       haloperidol lactate (HALDOL) injection 10 mg  10 mg Intramuscular TID PRN Lewis, Tanika N, NP       And   diphenhydrAMINE (BENADRYL) injection 50 mg  50 mg Intramuscular TID PRN Ezzard Staci SAILOR, NP       And   LORazepam  (ATIVAN ) injection 2 mg  2 mg Intramuscular TID PRN Ezzard Staci SAILOR, NP       docusate sodium (COLACE) capsule 100 mg  100 mg Oral Daily Bobbitt, Shalon E, NP   100 mg at 12/08/23 0918   feeding supplement (KATE FARMS STANDARD 1.4) liquid 325 mL  325 mL Oral BID BM Rosalind Guido, MD   325 mL at 12/08/23 1355   FLUoxetine (PROZAC) capsule 10 mg  10 mg Oral Daily Bobbitt, Shalon E, NP   10 mg at 12/08/23 9081   fluticasone  furoate-vilanterol (BREO ELLIPTA) 100-25 MCG/ACT 1 puff  1 puff Inhalation Daily Bobbitt, Shalon E, NP   1 puff at 12/08/23 0915   ketorolac  (  ACULAR ) 0.5 % ophthalmic solution 2 drop  2 drop Both Eyes QID Bobbitt, Shalon E, NP   2 drop at 12/08/23 0916   lipase/protease/amylase (CREON ) capsule 12,000 Units  12,000 Units Oral TID AC Motley-Mangrum, Jadeka A,  PMHNP   12,000 Units at 12/08/23 1124   multivitamin with minerals tablet 1 tablet  1 tablet Oral Daily Bobbitt, Shalon E, NP   1 tablet at 12/08/23 0918   naproxen (NAPROSYN) tablet 500 mg  500 mg Oral BID WC Bobbitt, Shalon E, NP   500 mg at 12/08/23 0918   nicotine  (NICODERM CQ  - dosed in mg/24 hours) patch 14 mg  14 mg Transdermal Daily Bobbitt, Shalon E, NP   14 mg at 12/08/23 9082   ondansetron  (ZOFRAN ) tablet 4 mg  4 mg Oral Q8H PRN Bobbitt, Shalon E, NP       pantoprazole  (PROTONIX ) EC tablet 80 mg  80 mg Oral Daily Bobbitt, Shalon E, NP   80 mg at 12/08/23 0918   prednisoLONE  acetate (PRED FORTE ) 1 % ophthalmic suspension 1 drop  1 drop Both Eyes QID Bobbitt, Shalon E, NP   1 drop at 12/08/23 0916   simethicone  (MYLICON) chewable tablet 80 mg  80 mg Oral Q6H PRN Bobbitt, Shalon E, NP       traZODone (DESYREL) tablet 50 mg  50 mg Oral QHS PRN Bobbitt, Shalon E, NP   50 mg at 12/07/23 2124    Lab Results: No results found for this or any previous visit (from the past 48 hours).  Blood Alcohol  level:  Lab Results  Component Value Date   ETH <10 06/08/2019   ETH <10 11/27/2017    Metabolic Disorder Labs: Lab Results  Component Value Date   HGBA1C 5.2 06/09/2019   MPG 102.54 06/09/2019   No results found for: PROLACTIN Lab Results  Component Value Date   CHOL 93 11/27/2017   TRIG 94 02/25/2018   HDL 29 (L) 11/27/2017   CHOLHDL 3.2 11/27/2017   VLDL 28 11/27/2017   LDLCALC 36 11/27/2017    Physical Findings: AIMS:  , ,  ,  ,    CIWA:    COWS:      Psychiatric Specialty Exam:  Presentation  General Appearance:  Appropriate for Environment; Casual  Eye Contact: Fair  Speech: Normal Rate  Speech Volume: Decreased    Mood and Affect  Mood: Improving  Affect: Improving   Thought Process  Thought Processes: Linear  Orientation:Full (Time, Place and Person)  Thought Content:Illogical  Hallucinations:Hallucinations: None  Ideas of  Reference:None  Suicidal Thoughts: Denies Homicidal Thoughts:Homicidal Thoughts: No   Sensorium  Memory: Immediate Fair; Recent Fair; Remote Fair  Judgment: Impaired  Insight: Shallow   Executive Functions  Concentration: Poor  Attention Span: Poor  Recall: [employer] of Knowledge: Fair  Language: Fair   Psychomotor Activity  Psychomotor Activity: Psychomotor Activity: Normal  Musculoskeletal: Strength & Muscle Tone: within normal limits Gait & Station: normal Assets  Assets: Manufacturing Systems Engineer; Desire for Improvement; Physical Health; Resilience    Physical Exam: Physical Exam Vitals and nursing note reviewed.    ROS Blood pressure 102/66, pulse 62, temperature 98.4 F (36.9 C), resp. rate 16, height 5' 7 (1.702 m), weight 45.6 kg, SpO2 99%. Body mass index is 15.74 kg/m.  Diagnosis: Principal Problem:   MDD (major depressive disorder), recurrent episode, severe (HCC) Active Problems:   Major depressive disorder, recurrent severe without psychotic features [facility])  Clinical Decision Making: Patient is currently  admitted for worsening depression and anxiety and suicidal ideation in the context of having access to his prescribed medications including Prozac and Xanax  that worsened the anxiety.  Patient will be monitored closely for safety concerns.  Treatment Plan Summary:  Safety and Monitoring:             -- Voluntary admission to inpatient psychiatric unit for safety, stabilization and treatment             -- Daily contact with patient to assess and evaluate symptoms and progress in treatment             -- Patient's case to be discussed in multi-disciplinary team meeting             -- Observation Level: q15 minute checks             -- Vital signs:  q12 hours             -- Precautions: suicide, elopement, and assault   2. Psychiatric Diagnoses and Treatment:              Plan to titrate up  Prozac 20 mg daily and  Xanax  1 mg 3 times  daily as needed anxiety that patient has been taking for many years for his agoraphobia, social anxiety     -- The risks/benefits/side-effects/alternatives to this medication were discussed in detail with the patient and time was given for questions. The patient consents to medication trial.                -- Metabolic profile and EKG monitoring obtained while on an atypical antipsychotic (BMI: Lipid Panel: HbgA1c: QTc:)              -- Encouraged patient to participate in unit milieu and in scheduled group therapies                            3. Medical Issues Being Addressed:  GJ tube stable 4. Discharge Planning:   -- Social work and case management to assist with discharge planning and identification of hospital follow-up needs prior to discharge  -- Estimated LOS: 3-4 days  Allyn Foil, MD 12/08/2023, 3:29 PM

## 2023-12-08 NOTE — Plan of Care (Signed)
   Problem: Coping: Goal: Ability to verbalize frustrations and anger appropriately will improve Outcome: Progressing Goal: Ability to demonstrate self-control will improve Outcome: Progressing

## 2023-12-08 NOTE — Plan of Care (Signed)
   Problem: Education: Goal: Knowledge of [facility] Education information/materials will improve Outcome: Progressing Goal: Emotional status will improve Outcome: Progressing Goal: Mental status will improve Outcome: Progressing Goal: Verbalization of understanding the information provided will improve Outcome: Progressing

## 2023-12-08 NOTE — Group Note (Signed)
 Physical/Occupational Therapy Group Note  Group Topic: Transfer Training   Group Date: 12/08/2023 Start Time: 1300 End Time: 1325 Facilitators: Katrena Stehlin, Alm Hamilton, PT   Group Description: Group educated on sequence and techniques to maximize safety with functional transfers.  Additionally, integrated education on impact of seating surfaces, use of assistive device and management of orthostasis with movement transitions.  Patients actively engaged with functional transfers (sit/stand) from various seating surfaces, with and without assist devices, working to integrate and retain education provided during session.  Allowed time for questions and further discussion on mobility concerns/needs.   Therapeutic Goal(s):  Identify and demonstrate safe technique for sit/stand transfers from various seating surfaces. Identify and demonstrate safe use of assistive devices with basic transfers and simple mobility. Identify and demonstrate ability to recognize signs/symptoms of orthostasis and appropriate compensatory/safety techniques.  Individual Participation: Did not attend  Participation Level:   Participation Quality:   Behavior:   Speech/Thought Process:   Affect/Mood:   Insight:   Judgement:   Individualization:   Modes of Intervention:   Patient Response to Interventions:    Plan: Continue to engage patient in PT/OT groups 1 - 2x/week.  CHARM Hamilton Bertin PT, DPT 12/08/23, 5:14 PM

## 2023-12-08 NOTE — Discharge Instructions (Signed)
 Resume Home Tube Feed Regimen  Nutren 2.0- Give 4 cartons daily. Administer each carton into J-port via pump @100ml /hr. Flush tube with 60ml of water  before and after each feeding.

## 2023-12-08 NOTE — Group Note (Signed)
 Date:  12/08/2023 Time:  10:53 AM  Group Topic/Focus:  Goals Group:   The focus of this group is to help patients establish daily goals to achieve during treatment and discuss how the patient can incorporate goal setting into their daily lives to aide in recovery.    Participation Level:  None  Participation Quality:     Affect:    Cognitive:     Insight: None  Engagement in Group:     Modes of Intervention:     Additional Comments:     Ahsan Esterline K 12/08/2023, 10:53 AM

## 2023-12-08 NOTE — Group Note (Signed)
 Date:  12/08/2023 Time:  10:47 PM  Group Topic/Focus:  Wrap-Up Group:   The focus of this group is to help patients review their daily goal of treatment and discuss progress on daily workbooks.    Participation Level:  Active  Participation Quality:  Appropriate  Affect:  Appropriate  Cognitive:  Alert  Insight: Appropriate  Engagement in Group:  Engaged  Modes of Intervention:  Discussion  Additional Comments:    Tommas CHRISTELLA Bunker 12/08/2023, 10:47 PM

## 2023-12-08 NOTE — BH IP Treatment Plan (Signed)
 Interdisciplinary Treatment and Diagnostic Plan Update  12/08/2023 Time of Session: 10:17 AM  CORNELIOUS BARTOLUCCI MRN: [id number]  Principal Diagnosis: MDD (major depressive disorder), recurrent episode, severe (HCC)  Secondary Diagnoses: Principal Problem:   MDD (major depressive disorder), recurrent episode, severe (HCC) Active Problems:   Major depressive disorder, recurrent severe without psychotic features (HCC)   Current Medications:  Current Facility-Administered Medications  Medication Dose Route Frequency Provider Last Rate Last Admin   acetaminophen  (TYLENOL ) tablet 650 mg  650 mg Oral Q6H PRN Bobbitt, Shalon E, NP   650 mg at 12/08/23 0409   ALPRAZolam  (XANAX ) tablet 1 mg  1 mg Oral TID PRN Jadapalle, Sree, MD   1 mg at 12/08/23 0918   alum & mag hydroxide-simeth (MAALOX/MYLANTA) 200-200-20 MG/5ML suspension 30 mL  30 mL Oral Q4H PRN Ezzard Staci SAILOR, NP       cholecalciferol  (VITAMIN D3) 25 MCG (1000 UNIT) tablet 1,000 Units  1,000 Units Oral Daily Bobbitt, Shalon E, NP   1,000 Units at 12/08/23 0918   cyanocobalamin (VITAMIN B12) tablet 2,000 mcg  2,000 mcg Oral Daily Bobbitt, Shalon E, NP   2,000 mcg at 12/08/23 0918   haloperidol (HALDOL) tablet 5 mg  5 mg Oral TID PRN Lewis, Tanika N, NP       And   diphenhydrAMINE (BENADRYL) capsule 50 mg  50 mg Oral TID PRN Ezzard Staci SAILOR, NP       haloperidol lactate (HALDOL) injection 5 mg  5 mg Intramuscular TID PRN Lewis, Tanika N, NP       And   diphenhydrAMINE (BENADRYL) injection 50 mg  50 mg Intramuscular TID PRN Ezzard Staci SAILOR, NP       And   LORazepam  (ATIVAN ) injection 2 mg  2 mg Intramuscular TID PRN Ezzard Staci SAILOR, NP       haloperidol lactate (HALDOL) injection 10 mg  10 mg Intramuscular TID PRN Lewis, Tanika N, NP       And   diphenhydrAMINE (BENADRYL) injection 50 mg  50 mg Intramuscular TID PRN Ezzard Staci SAILOR, NP       And   LORazepam  (ATIVAN ) injection 2 mg  2 mg Intramuscular TID PRN Ezzard Staci SAILOR, NP        docusate sodium (COLACE) capsule 100 mg  100 mg Oral Daily Bobbitt, Shalon E, NP   100 mg at 12/08/23 0918   feeding supplement (KATE FARMS STANDARD 1.4) liquid 325 mL  325 mL Oral BID BM Jadapalle, Sree, MD       FLUoxetine (PROZAC) capsule 10 mg  10 mg Oral Daily Bobbitt, Shalon E, NP   10 mg at 12/08/23 9081   fluticasone  furoate-vilanterol (BREO ELLIPTA) 100-25 MCG/ACT 1 puff  1 puff Inhalation Daily Bobbitt, Shalon E, NP   1 puff at 12/08/23 0915   ketorolac  (ACULAR ) 0.5 % ophthalmic solution 2 drop  2 drop Both Eyes QID Bobbitt, Shalon E, NP   2 drop at 12/08/23 0916   lipase/protease/amylase (CREON ) capsule 12,000 Units  12,000 Units Oral TID AC Motley-Mangrum, Jadeka A, PMHNP   12,000 Units at 12/08/23 0759   multivitamin with minerals tablet 1 tablet  1 tablet Oral Daily Bobbitt, Shalon E, NP   1 tablet at 12/08/23 0918   naproxen (NAPROSYN) tablet 500 mg  500 mg Oral BID WC Bobbitt, Shalon E, NP   500 mg at 12/08/23 9081   nicotine  (NICODERM CQ  - dosed in mg/24 hours) patch 14 mg  14 mg Transdermal  Daily Bobbitt, Shalon E, NP   14 mg at 12/08/23 9082   ondansetron  (ZOFRAN ) tablet 4 mg  4 mg Oral Q8H PRN Bobbitt, Shalon E, NP       pantoprazole  (PROTONIX ) EC tablet 80 mg  80 mg Oral Daily Bobbitt, Shalon E, NP   80 mg at 12/08/23 9081   prednisoLONE  acetate (PRED FORTE ) 1 % ophthalmic suspension 1 drop  1 drop Both Eyes QID Bobbitt, Shalon E, NP   1 drop at 12/08/23 0916   simethicone  (MYLICON) chewable tablet 80 mg  80 mg Oral Q6H PRN Bobbitt, Shalon E, NP       traZODone (DESYREL) tablet 50 mg  50 mg Oral QHS PRN Bobbitt, Shalon E, NP   50 mg at 12/07/23 2124   PTA Medications: Medications Prior to Admission  Medication Sig Dispense Refill Last Dose/Taking   ALPRAZolam  (XANAX ) 1 MG tablet Take 1 tablet (1 mg total) by mouth 3 (three) times daily as needed for up to 15 days for anxiety (reordered home medication). 45 tablet 0    Aspirin-Acetaminophen  (GOODYS BODY PAIN PO) Take 1 Package  by mouth 4 (four) times daily as needed (pain).      bismuth subsalicylate (PEPTO BISMOL) 262 MG/15ML suspension Take 30 mLs by mouth every 6 (six) hours as needed for indigestion or diarrhea or loose stools.      Cholecalciferol  (VITAMIN D  HIGH POTENCY) 25 MCG (1000 UT) capsule Take 1,000 Units by mouth daily.      CREON  24000-76000 units CPEP Take 1-3 capsules by mouth See admin instructions. Take 3 capsules by mouth three times daily with each meal and take 1 capsule by mouth daily with each snack.      cyanocobalamin (VITAMIN B12) 1000 MCG tablet Take 2,000 mcg by mouth daily.      docusate sodium (COLACE) 100 MG capsule Take 100 mg by mouth daily.      FLUoxetine (PROZAC) 20 MG/5ML solution Take 2.5 mLs (10 mg total) by mouth daily. (Patient not taking: Reported on 12/04/2023) 120 mL 0    ketorolac  (ACULAR ) 0.5 % ophthalmic solution Place 2 drops into both eyes 4 (four) times daily.      Multiple Vitamin (MULTIVITAMIN WITH MINERALS) TABS tablet Take 1 tablet by mouth daily. (Patient not taking: Reported on 12/04/2023) 30 tablet 0    naphazoline-glycerin (CLEAR EYES REDNESS) SOLN Place 1-2 drops into both eyes 4 (four) times daily as needed for eye irritation. 100 mL 0    naproxen (NAPROSYN) 500 MG tablet Take 1 tablet (500 mg total) by mouth 2 (two) times daily with a meal. 30 tablet 0    nicotine  (NICODERM CQ  - DOSED IN MG/24 HOURS) 14 mg/24hr patch Place 1 patch (14 mg total) onto the skin daily. (Patient not taking: Reported on 12/04/2023) 28 patch 0    Nutritional Supplements (FEEDING SUPPLEMENT, KATE FARMS STANDARD 1.4,) LIQD liquid Take 325 mLs by mouth 2 (two) times daily between meals. 19500 mL 0    omeprazole (PRILOSEC) 40 MG capsule Take 40 mg by mouth at bedtime.      ondansetron  (ZOFRAN ) 4 MG tablet Take 4 mg by mouth every 8 (eight) hours as needed for nausea.       prednisoLONE  acetate (PRED FORTE ) 1 % ophthalmic suspension Place 1 drop into both eyes 4 (four) times daily.       simethicone  (MYLICON) 80 MG chewable tablet Chew 1 tablet (80 mg total) by mouth every 6 (six) hours as needed for  flatulence. 30 tablet 0    SYMBICORT 80-4.5 MCG/ACT inhaler Inhale 1 puff into the lungs 2 (two) times daily.      traZODone (DESYREL) 100 MG tablet Take 1 tablet (100 mg total) by mouth at bedtime. 30 tablet 0     Patient Stressors:    Patient Strengths:    Treatment Modalities: Medication Management, Group therapy, Case management,  1 to 1 session with clinician, Psychoeducation, Recreational therapy.   Physician Treatment Plan for Primary Diagnosis: MDD (major depressive disorder), recurrent episode, severe (HCC) Long Term Goal(s): Improvement in symptoms so as ready for discharge   Short Term Goals: Ability to identify changes in lifestyle to reduce recurrence of condition will improve Ability to verbalize feelings will improve Ability to disclose and discuss suicidal ideas Ability to demonstrate self-control will improve Ability to identify and develop effective coping behaviors will improve  Medication Management: Evaluate patient's response, side effects, and tolerance of medication regimen.  Therapeutic Interventions: 1 to 1 sessions, Unit Group sessions and Medication administration.  Evaluation of Outcomes: Progressing  Physician Treatment Plan for Secondary Diagnosis: Principal Problem:   MDD (major depressive disorder), recurrent episode, severe (HCC) Active Problems:   Major depressive disorder, recurrent severe without psychotic features (HCC)  Long Term Goal(s): Improvement in symptoms so as ready for discharge   Short Term Goals: Ability to identify changes in lifestyle to reduce recurrence of condition will improve Ability to verbalize feelings will improve Ability to disclose and discuss suicidal ideas Ability to demonstrate self-control will improve Ability to identify and develop effective coping behaviors will improve     Medication  Management: Evaluate patient's response, side effects, and tolerance of medication regimen.  Therapeutic Interventions: 1 to 1 sessions, Unit Group sessions and Medication administration.  Evaluation of Outcomes: Progressing   RN Treatment Plan for Primary Diagnosis: MDD (major depressive disorder), recurrent episode, severe (HCC) Long Term Goal(s): Knowledge of disease and therapeutic regimen to maintain health will improve  Short Term Goals: Ability to remain free from injury will improve, Ability to verbalize frustration and anger appropriately will improve, Ability to demonstrate self-control, Ability to participate in decision making will improve, Ability to verbalize feelings will improve, Ability to disclose and discuss suicidal ideas, Ability to identify and develop effective coping behaviors will improve, and Compliance with prescribed medications will improve  Medication Management: RN will administer medications as ordered by provider, will assess and evaluate patient's response and provide education to patient for prescribed medication. RN will report any adverse and/or side effects to prescribing provider.  Therapeutic Interventions: 1 on 1 counseling sessions, Psychoeducation, Medication administration, Evaluate responses to treatment, Monitor vital signs and CBGs as ordered, Perform/monitor CIWA, COWS, AIMS and Fall Risk screenings as ordered, Perform wound care treatments as ordered.  Evaluation of Outcomes: Progressing   LCSW Treatment Plan for Primary Diagnosis: MDD (major depressive disorder), recurrent episode, severe (HCC) Long Term Goal(s): Safe transition to appropriate next level of care at discharge, Engage patient in therapeutic group addressing interpersonal concerns.  Short Term Goals: Engage patient in aftercare planning with referrals and resources, Increase social support, Increase ability to appropriately verbalize feelings, Increase emotional regulation,  Facilitate acceptance of mental health diagnosis and concerns, Facilitate patient progression through stages of change regarding substance use diagnoses and concerns, Identify triggers associated with mental health/substance abuse issues, and Increase skills for wellness and recovery  Therapeutic Interventions: Assess for all discharge needs, 1 to 1 time with Social worker, Explore available resources and support systems, Assess  for adequacy in community support network, Educate family and significant other(s) on suicide prevention, Complete Psychosocial Assessment, Interpersonal group therapy.  Evaluation of Outcomes: Progressing   Progress in Treatment: Attending groups: Yes. and No. Participating in groups: Yes. and No. Taking medication as prescribed: Yes. Toleration medication: Yes. Family/Significant other contact made: No, will contact:  CSW will contact pt's brother Arshad Oberholzer, [phone] Patient understands diagnosis: Yes. Discussing patient identified problems/goals with staff: Yes. Medical problems stabilized or resolved: Yes. Denies suicidal/homicidal ideation: Yes. Issues/concerns per patient self-inventory: No. Other: None   New problem(s) identified: No, Describe:  None identified   New Short Term/Long Term Goal(s):  elimination of symptoms of psychosis, medication management for mood stabilization; elimination of SI thoughts; development of comprehensive mental wellness plan.   Patient Goals:  Transportation  Discharge Plan or Barriers: CSW will assist with appropriate transportation   Reason for Continuation of Hospitalization: Anxiety Depression Medication stabilization  Estimated Length of Stay: 1 to 7 days   Last 3 [place] Suicide Severity Risk Score: Flowsheet Row Admission (Current) from 12/06/2023 in [facility] BEH[facility]AL [facility] ED to Hosp-Admission (Discharged) from 12/03/2023 in [place] [facility] Admission (Discharged) from  11/22/2023 in [facility] BE[facility]AL MEDICINE  C-SSRS RISK CATEGORY Low Risk Low Risk Low Risk    Last PHQ 2/9 Scores:     No data to display          Scribe for Treatment Team: Lum [name], ISRAEL 12/08/2023 10:59 AM

## 2023-12-08 NOTE — Group Note (Signed)
 Recreation Therapy Group Note   Group Topic:Coping Skills  Group Date: 12/08/2023 Start Time: 1500 End Time: 1540 Facilitators: Celestia Jeoffrey BRAVO, LRT, CTRS Location: Dayroom  Group Description: Seated Exercise. LRT discussed the mental and physical benefits of exercise. LRT and group discussed how physical activity can be used as a coping skill. Pt's and LRT followed along to an exercise video on the TV screen that provided a visual representation and audio description of every exercise performed. Pt's encouraged to listen to their bodies and stop at any time if they experience feelings of discomfort or pain. Pts were encouraged to drink water  and stay hydrated.   Goal Area(s) Addressed:  Patient will learn benefits of physical activity. Patient will identify exercise as a coping skill.  Patient will follow multistep directions. Patient will try a new leisure interest.    Affect/Mood: N/A   Participation Level: Did not attend    Clinical Observations/Individualized Feedback: Patient did not attend group.   Plan: Continue to engage patient in RT group sessions 2-3x/week.   Jeoffrey BRAVO Celestia, LRT, CTRS 12/08/2023 4:38 PM

## 2023-12-08 NOTE — Progress Notes (Signed)
   12/08/23 0600  Psych Admission Type (Psych Patients Only)  Admission Status Voluntary  Psychosocial Assessment  Patient Complaints Anxiety  Eye Contact Fair  Facial Expression Flat  Affect Flat  Speech Soft  Interaction Assertive  Motor Activity Slow  Appearance/Hygiene In scrubs  Behavior Characteristics Cooperative;Appropriate to situation  Mood Pleasant  Thought Process  Coherency Circumstantial  Content Preoccupation  Delusions None reported or observed  Perception WDL  Hallucination None reported or observed  Judgment Impaired  Confusion None  Danger to Self  Current suicidal ideation? Denies  Danger to Others  Danger to Others None reported or observed

## 2023-12-08 NOTE — Progress Notes (Addendum)
 SI/HI/AVH: denies all  Behavior/Mood: cooperative/pleasant    Interaction/Group attendance: assertive/0 of 4 groups    Medication/PRNs: compliant/xanax  x2, Tylenol     Pain: denies   Other: discharge expected 10.30

## 2023-12-08 NOTE — [facility] Suicide Risk Assessment (Signed)
 [facility] INPATIENT:  Family/Significant Other Suicide Prevention Education  Suicide Prevention Education:  Education Completed; Brent [name], brother, [phone],  (name of family member/significant other) has been identified by the patient as the family member/significant other with whom the patient will be residing, and identified as the person(s) who will aid the patient in the event of a mental health crisis (suicidal ideations/suicide attempt).  With written consent from the patient, the family member/significant other has been provided the following suicide prevention education, prior to the and/or following the discharge of the patient.  The suicide prevention education provided includes the following: Suicide risk factors Suicide prevention and interventions National Suicide Hotline telephone number [facility] assessment telephone number [facility] Emergency Assistance 911 [facility] and/or Residential Mobile Crisis Unit telephone number  Request made of family/significant other to: Remove weapons (e.g., guns, rifles, knives), all items previously/currently identified as safety concern.   Remove drugs/medications (over-the-counter, prescriptions, illicit drugs), all items previously/currently identified as a safety concern.  The family member/significant other verbalizes understanding of the suicide prevention education information provided.  The family member/significant other agrees to remove the items of safety concern listed above.  Brent [name] 12/08/2023, 1:53 PM

## 2023-12-08 NOTE — Group Note (Signed)
 Date:  12/08/2023 Time:  5:24 PM  Group Topic/Focus:  Self Care:   The focus of this group is to help patients understand the importance of self-care in order to improve or restore emotional, physical, spiritual, interpersonal, and financial health.    Participation Level:  Active  Participation Quality:  Appropriate  Affect:  Appropriate  Cognitive:  Alert  Insight: Appropriate  Engagement in Group:  Engaged  Modes of Intervention:  Discussion  Additional Comments:    Garen CINDERELLA Daring 12/08/2023, 5:24 PM

## 2023-12-09 DIAGNOSIS — F332 Major depressive disorder, recurrent severe without psychotic features: Secondary | ICD-10-CM | POA: Diagnosis not present

## 2023-12-09 NOTE — Evaluation (Signed)
 Physical Therapy Evaluation Patient Details Name: Brent [name] MRN: [id number] DOB: September 14, 1970 Today's Date: 12/09/2023  History of Present Illness  Pt is a 53 y/o M presenting to ED with c/o abdominal pain, suicidal ideation, depression. Pt was transferred to [facility] unit from [facility] on 12/06/23. Workup for MDD recurrent severe without psychotic features. PMH significant for alcohol  use, anxiety, arthritis, depression, dyspnea on exertion, GERD, hx of chronic pancreatitis, GJ tube placement.   Clinical Impression  Pt A&Ox4, pleasant and agreeable to participate in PT evaluation. At baseline, pt is IND with mobility/ADLs, denies hx of falls or previous AD use, IND with GJ tube management at home. Pt was met sitting in dayroom, able to perform STS transfers IND with good demonstration of eccentric control to sitting. Pt amb ~169ft independently, no LOB throughout, good ability to perform dynamic gait tasks such as horizontal/vertical head turns and dual-task walking and talking with no excessive path variance. Pt denies symptoms of weakness or balance deficits, states his main priority is improving his current mental state. Pt appears to be at his baseline level of function, no acute needs identified at this time. PT to sign-off, can be re-consulted if acute need arises.        If plan is discharge home, recommend the following: Assist for transportation   Can travel by private vehicle        Equipment Recommendations None recommended by PT  Recommendations for Other Services       Functional Status Assessment Patient has had a recent decline in their functional status and demonstrates the ability to make significant improvements in function in a reasonable and predictable amount of time.     Precautions / Restrictions Precautions Precautions: None Precaution/Restrictions Comments: GJ tube Restrictions Weight Bearing Restrictions Per Provider Order: No       Mobility  Bed Mobility               General bed mobility comments: NT, pt seated in dayroom pre/post session    Transfers Overall transfer level: Independent Equipment used: None               General transfer comment: STS from low height chair with no apparent difficulties, good eccentric control to sitting    Ambulation/Gait Ambulation/Gait assistance: Independent Gait Distance (Feet): 100 Feet Assistive device: None Gait Pattern/deviations: WFL(Within Functional Limits), Step-through pattern, Narrow base of support Gait velocity: WFL     General Gait Details: No LOB, steady cadence throughout with no excessive path variance. Good ability to perform horizontal/vertical head turns during gait without LOB  Stairs            Wheelchair Mobility     Tilt Bed    Modified Rankin (Stroke Patients Only)       Balance Overall balance assessment: No apparent balance deficits (not formally assessed)                                           Pertinent Vitals/Pain Pain Assessment Pain Assessment: No/denies pain    Home Living Family/patient expects to be discharged to:: Private residence Living Arrangements: Other relatives (brother) Available Help at Discharge: Family;Available 24 hours/day Type of Home: House Home Access: Level entry       Home Layout: One level Home Equipment: None      Prior Function Prior Level of Function : Independent/Modified  Independent             Mobility Comments: Pt reports being IND with mobility, denies hx of AD use or falls ADLs Comments: IND with ADLs, manages tube feeds independently at home, does not drive     Extremity/Trunk Assessment   Upper Extremity Assessment Upper Extremity Assessment: Overall WFL for tasks assessed    Lower Extremity Assessment Lower Extremity Assessment: Overall WFL for tasks assessed       Communication   Communication Communication: No apparent  difficulties    Cognition Arousal: Alert Behavior During Therapy: WFL for tasks assessed/performed   PT - Cognitive impairments: No apparent impairments                       PT - Cognition Comments: A&Ox4, pleasant and cooperative throughout Following commands: Intact       Cueing Cueing Techniques: Verbal cues     General Comments      Exercises     Assessment/Plan    PT Assessment Patient does not need any further PT services  PT Problem List         PT Treatment Interventions      PT Goals (Current goals can be found in the Care Plan section)  Acute Rehab PT Goals Patient Stated Goal: to get better mentally PT Goal Formulation: All assessment and education complete, DC therapy    Frequency       Co-evaluation               AM-PAC PT 6 Clicks Mobility  Outcome Measure Help needed turning from your back to your side while in a flat bed without using bedrails?: None Help needed moving from lying on your back to sitting on the side of a flat bed without using bedrails?: None Help needed moving to and from a bed to a chair (including a wheelchair)?: None Help needed standing up from a chair using your arms (e.g., wheelchair or bedside chair)?: None Help needed to walk in hospital room?: None Help needed climbing 3-5 steps with a railing? : None 6 Click Score: 24    End of Session   Activity Tolerance: Patient tolerated treatment well Patient left: in chair (pt left sitting in dayroom) Nurse Communication: Mobility status PT Visit Diagnosis: Other abnormalities of gait and mobility (R26.89)    Time: 9083-9075 PT Time Calculation (min) (ACUTE ONLY): 8 min   Charges:   PT Evaluation $PT Eval Low Complexity: 1 Low   PT General Charges $$ ACUTE PT VISIT: 1 Visit         Janell Axe, SPT

## 2023-12-09 NOTE — Group Note (Deleted)
 Recreation Therapy Group Note   Group Topic:Coping Skills  Group Date: 12/09/2023 Start Time: 1100 End Time: 1130 Facilitators: Celestia Jeoffrey BRAVO, LRT Location: Dayroom  Group Description: Music. Patients are encouraged to name their favorite song(s) for LRT to play song through speaker for group to hear, while in the courtyard getting fresh air and sunlight. Patients educated on the definition of leisure and the importance of having different leisure interests outside of the hospital. Group discussed how leisure activities can often be used as pharmacologist and that listening to music and being outside are examples.    Goal Area(s) Addressed:  Patient will identify a current leisure interest.  Patient will practice making a positive decision. Patient will have the opportunity to try a new leisure activity.       Affect/Mood: {RT B[facility] Affect/Mood:26271}   Participation Level: {RT [facility] Participation Level:26267}   Participation Quality: {RT [facility] Participation Quality:26268}   Behavior: {RT B[facility] Group Behavior:26269}   Speech/Thought Process: {RT B[facility] Speech/Thought:26276}   Insight: {RT BHH Insight:26272}   Judgement: {RT BHH Judgement:26278}   Modes of Intervention: {RT BHH Modes of Intervention:26277}   Patient Response to Interventions:  {RT B[facility] Patient Response to Intervention:26274}   Education Outcome:  {RT B[facility] Education Outcome:26279}   Clinical Observations/Individualized Feedback: *** was *** in their participation of session activities and group discussion. Pt identified ***   Plan: {RT BHH Tx Eojw:73719}   Jeoffrey BRAVO Celestia, LRT,  12/09/2023 1:28 PM

## 2023-12-09 NOTE — Plan of Care (Signed)
  Problem: Education: Goal: Emotional status will improve 12/09/2023 0546 by Adrian Specht, Shanda ORN, RN Outcome: Progressing 12/09/2023 0516 by Dinesh Ulysse, Shanda ORN, RN Outcome: Progressing Goal: Mental status will improve Outcome: Progressing

## 2023-12-09 NOTE — Group Note (Signed)
 LCSW Group Therapy Note  Group Date: 12/09/2023 Start Time: 1315 End Time: 1400   Type of Therapy and Topic:  Group Therapy - Healthy vs Unhealthy Coping Skills  Participation Level:  Did Not Attend   Description of Group The focus of this group was to determine what unhealthy coping techniques typically are used by group members and what healthy coping techniques would be helpful in coping with various problems. Patients were guided in becoming aware of the differences between healthy and unhealthy coping techniques. Patients were asked to identify 2-3 healthy coping skills they would like to learn to use more effectively.  Therapeutic Goals Patients learned that coping is what human beings do all day long to deal with various situations in their lives Patients defined and discussed healthy vs unhealthy coping techniques Patients identified their preferred coping techniques and identified whether these were healthy or unhealthy Patients determined 2-3 healthy coping skills they would like to become more familiar with and use more often. Patients provided support and ideas to each other   Summary of Patient Progress:  X   Therapeutic Modalities Cognitive Behavioral Therapy Motivational Interviewing  Lum [name], [place] 12/09/2023  2:29 PM

## 2023-12-09 NOTE — Group Note (Signed)
 Recreation Therapy Group Note   Group Topic:Emotion Expression  Group Date: 12/09/2023 Start Time: 1500 End Time: 1600 Facilitators: Celestia Jeoffrey BRAVO, LRT, CTRS Location: Dayroom  Group Description: Painting a Diplomatic Services Operational Officer. Patients and LRT discuss what it means to be "at peace", what it feels like physically and mentally. Pts are given a canvas and watercolor paint to use and encouraged to draw their idea of a peaceful place. Pts and LRT discuss how they use this in their daily life post discharge. Pts are encouraged to take their canvas home with them as a reminder to find their peaceful place whenever they are feeling depressed, anxious, etc.    Goal Area(s) Addressed:  Patient will identify what it means to experience a "peaceful" emotion. Patient will identify a new coping skill.  Patient will express their emotions through art. Patients will increase communication by talking with LRT and peers while in group.  Affect/Mood: N/A   Participation Level: Did not attend    Clinical Observations/Individualized Feedback: Patient did not attend group.   Plan: Continue to engage patient in RT group sessions 2-3x/week.   Jeoffrey BRAVO Celestia, LRT, CTRS 12/09/2023 4:47 PM

## 2023-12-09 NOTE — Progress Notes (Signed)
   12/09/23 1100  Psych Admission Type (Psych Patients Only)  Admission Status Voluntary  Psychosocial Assessment  Patient Complaints Anxiety  Eye Contact Brief  Facial Expression Sad  Affect Sad  Speech Soft  Interaction Minimal  Motor Activity Slow  Appearance/Hygiene In scrubs  Behavior Characteristics Cooperative  Mood Pleasant  Thought Process  Coherency Disorganized  Content WDL  Delusions None reported or observed  Perception WDL  Hallucination None reported or observed  Judgment Poor  Confusion None  Danger to Self  Current suicidal ideation? Denies  Agreement Not to Harm Self Yes  Description of Agreement verbal  Danger to Others  Danger to Others None reported or observed

## 2023-12-09 NOTE — Progress Notes (Signed)
 [facility] MD Progress Note  12/09/2023 11:41 AM Brent [name]  MRN:  [id number]  Brent [name] is a 53 y.o. 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  He was started on liquid Prozac and appeared to be tolerating this well and was otherwise stable for discharge at that time, however it appears that he was not able to obtain this medication.  Additionally, he has some suicidal thoughts and wanted to be evaluated. Patient is admitted to [facility] unit with Q15 min safety monitoring. Multidisciplinary team approach is offered. Medication management; group/milieu therapy is offered.  Subjective:  Chart reviewed, case discussed in multidisciplinary meeting, patient seen during rounds.  On interview patient is noted to be resting in bed.  He reports fair sleep and energy.  He has intermittent GI upset due to the regular diet but the as needed medications are helping.  He denies SI/HI/plan and denies hallucinations.  He reports improvement in his depression and anxiety.  He is requesting the provider to get his medications filled at the hospital pharmacy this time so that he does not miss his medications.  Sleep: Fair  Appetite:  Fair  Past Psychiatric History: see h&P Family History:  Family History  Problem Relation Age of Onset   Hypertension Mother    Breast cancer Mother    Melanoma Mother    Stroke Mother    Lung cancer Father 19   Colon cancer Neg Hx    Colitis Neg Hx    Cirrhosis Neg Hx    Liver disease Neg Hx    Pancreatic cancer Neg Hx    Pancreatitis Neg Hx    Anesthesia problems Neg Hx    Hypotension Neg Hx    Malignant hyperthermia Neg Hx    Pseudochol deficiency Neg Hx    Social History:  Social History   Substance and Sexual Activity  Alcohol  Use Not Currently     Social History   Substance and  Sexual Activity  Drug Use No    Social History   Socioeconomic History   Marital status: Single    Spouse name: Not on file   Number of children: 0   Years of education: Not on file   Highest education level: Not on file  Occupational History   Occupation: disabled    Comment: anxiety  Tobacco Use   Smoking status: Every Day    Current packs/day: 1.00    Average packs/day: 1 pack/day for 21.7 years (21.7 ttl pk-yrs)    Types: Cigarettes, Cigars    Start date: 03/2002   Smokeless tobacco: Never   Tobacco comments:    1 pack per week now as of 02/24/2018   Vaping Use   Vaping status: Never Used  Substance and Sexual Activity   Alcohol  use: Not Currently   Drug use: No   Sexual activity: Not on file  Other Topics Concern   Not on file  Social History Narrative   Right handed   Caffeine use: mostly water /OJ, tea sometimes.    Social Drivers of Corporate Investment Banker Strain: Low Risk  (02/24/2018)   Overall Financial Resource Strain (CARDIA)    Difficulty of Paying Living Expenses: Not very hard  Food Insecurity: No Food Insecurity (12/06/2023)   Hunger Vital Sign    Worried About Running Out of Food in the Last Year: Never true  Ran Out of Food in the Last Year: Never true  Recent Concern: Food Insecurity - Food Insecurity Present (12/03/2023)   Hunger Vital Sign    Worried About Running Out of Food in the Last Year: Often true    Ran Out of Food in the Last Year: Often true  Transportation Needs: Unmet Transportation Needs (12/06/2023)   PRAPARE - Administrator, Civil Service (Medical): Yes    Lack of Transportation (Non-Medical): Yes  Physical Activity: Not on file  Stress: Not on file  Social Connections: Socially Isolated (12/06/2023)   Social Connection and Isolation Panel    Frequency of Communication with Friends and Family: Once a week    Frequency of Social Gatherings with Friends and Family: Once a week    Attends Religious Services:  Never    Database Administrator or Organizations: No    Attends Engineer, Structural: Never    Marital Status: Never married   Past Medical History:  Past Medical History:  Diagnosis Date   Alcohol  use    Anxiety    Disabled due to panic attacks   Arthritis    Bilateral ureteral calculi    Borderline type 2 diabetes mellitus    BPH (benign prostatic hypertrophy)    Chronic back pain    Condylomata acuminata in male    multiple procedures --  penile, peri-rectal , perineum   DDD (degenerative disc disease), cervical    Depression    Dyspnea on exertion    Emphysematous COPD (HCC)    Epiretinal membrane (ERM) of both eyes    Fibromyalgia    GERD (gastroesophageal reflux disease)    Headache(784.0)    History of chronic pancreatitis    severeal adx for this /  2008  dx alcoholic pancreatitis   History of esophageal dilatation    History of hiatal hernia    History of kidney stones    History of panic attacks    History of sepsis    adx 05-01-2014--  urosepsis due to kidney stones obstruction/ hydronephrosis   History of suicidal ideation    2006   adx   Hypertension    was on medication for short time, htn was caused by prednisone  per pt   Multiple sclerosis    pt. states has 4 small brain lesions   Nephrolithiasis    bilateral    Retinal vasculitis    Uveitis     Past Surgical History:  Procedure Laterality Date   BIOPSY  11/12/2012   Procedure: GASTRIC AND ESOPHAGEAL BIOPSIES;  Surgeon: Lamar CHRISTELLA Hollingshead, MD;  Location: [facility];  Service: Endoscopy;;   CATARACT EXTRACTION W/ INTRAOCULAR LENS  IMPLANT, BILATERAL     COLONOSCOPY  10/08/2011   Jenkins:Normal colon/Anal condyloma without extension proximal to dentate line   CYSTOSCOPY W/ URETERAL STENT PLACEMENT Bilateral 05/01/2014   Procedure: CYSTOSCOPY WITH BILATERAL RETROGRADE PYELOGRAM; BILATERAL URETERAL STENT PLACEMENT;  Surgeon: Alm Fragmin, MD;  Location: [facility];  Service: Urology;  Laterality: Bilateral;    CYSTOSCOPY W/ URETERAL STENT REMOVAL Bilateral 06/06/2014   Procedure: CYSTOSCOPY WITH STENT REMOVAL;  Surgeon: Garnette Shack, MD;  Location: [facility];  Service: Urology;  Laterality: Bilateral;   CYSTOSCOPY WITH URETEROSCOPY  06/06/2014   Procedure: CYSTOSCOPY WITH URETEROSCOPY;  Surgeon: Garnette Shack, MD;  Location: [facility];  Service: Urology;;   CYSTOSCOPY WITH URETEROSCOPY AND STENT PLACEMENT Bilateral 06/06/2014   Procedure: CYSTOSCOPY WITH J2 STENT EXTRACTION,,URETEROSCOPY WITH EXTRACTION OF  STONES,;  Surgeon: Garnette Shack, MD;  Location: [facility];  Service: Urology;  Laterality: Bilateral;   ELECTROCAUTERY/ DESICCATION OF CONDYLOMA LESIONS  01-15-2008  &  12-29-2009   PENIS, PERI-RECTAL , PERINUEM   ESOPHAGOGASTRODUODENOSCOPY (EGD) WITH PROPOFOL  N/A 11/12/2012   MFM:Wnmfjo esophagus s/p  passage of a Maloney dilator and biopsy. Abnormal gastric mucosa-status post biopsy   FLEXIBLE BRONCHOSCOPY N/A 08/12/2012   Procedure: FLEXIBLE BRONCHOSCOPY;  Surgeon: Dallas LITTIE Gelineau, MD;  Location: [facility];  Service: Pulmonary;  Laterality: N/A;   MALONEY DILATION N/A 11/12/2012   Procedure: MALONEY DILATION (54mm);  Surgeon: Lamar CHRISTELLA Hollingshead, MD;  Location: [facility];  Service: Endoscopy;  Laterality: N/A;   MULTIPLE EXTRACTIONS WITH ALVEOLOPLASTY N/A 04/22/2014   Procedure: MULTIPLE EXTRACTIONS ( 2,3,5,6,7,8,9,10,11,13,14,15,21,28  WITH ALVEOLOPLASTY;  Surgeon: Glendia Primrose, DDS;  Location: [facility];  Service: Oral Surgery;  Laterality: N/A;   WISDOM TOOTH EXTRACTION      Current Medications: Current Facility-Administered Medications  Medication Dose Route Frequency Provider Last Rate Last Admin   acetaminophen  (TYLENOL ) tablet 650 mg  650 mg Oral Q6H PRN Bobbitt, Shalon E, NP   650 mg at 12/09/23 0134   ALPRAZolam  (XANAX ) tablet 1 mg  1 mg Oral TID PRN Angelyna Henderson, MD   1 mg at 12/09/23 0937   alum & mag hydroxide-simeth (MAALOX/MYLANTA)  200-200-20 MG/5ML suspension 30 mL  30 mL Oral Q4H PRN Lewis, Tanika N, NP       cholecalciferol  (VITAMIN D3) 25 MCG (1000 UNIT) tablet 1,000 Units  1,000 Units Oral Daily Bobbitt, Shalon E, NP   1,000 Units at 12/09/23 0937   cyanocobalamin (VITAMIN B12) tablet 2,000 mcg  2,000 mcg Oral Daily Bobbitt, Shalon E, NP   2,000 mcg at 12/09/23 0937   haloperidol (HALDOL) tablet 5 mg  5 mg Oral TID PRN Lewis, Tanika N, NP       And   diphenhydrAMINE (BENADRYL) capsule 50 mg  50 mg Oral TID PRN Lewis, Tanika N, NP       haloperidol lactate (HALDOL) injection 5 mg  5 mg Intramuscular TID PRN Lewis, Tanika N, NP       And   diphenhydrAMINE (BENADRYL) injection 50 mg  50 mg Intramuscular TID PRN Ezzard Staci SAILOR, NP       And   LORazepam  (ATIVAN ) injection 2 mg  2 mg Intramuscular TID PRN Ezzard Staci SAILOR, NP       haloperidol lactate (HALDOL) injection 10 mg  10 mg Intramuscular TID PRN Lewis, Tanika N, NP       And   diphenhydrAMINE (BENADRYL) injection 50 mg  50 mg Intramuscular TID PRN Ezzard Staci SAILOR, NP       And   LORazepam  (ATIVAN ) injection 2 mg  2 mg Intramuscular TID PRN Ezzard Staci SAILOR, NP       docusate sodium (COLACE) capsule 100 mg  100 mg Oral Daily Bobbitt, Shalon E, NP   100 mg at 12/09/23 0937   feeding supplement (KATE FARMS STANDARD 1.4) liquid 325 mL  325 mL Oral BID BM Emilyn Ruble, MD   325 mL at 12/09/23 0936   FLUoxetine (PROZAC) capsule 10 mg  10 mg Oral Daily Bobbitt, Shalon E, NP   10 mg at 12/09/23 9062   fluticasone  furoate-vilanterol (BREO ELLIPTA) 100-25 MCG/ACT 1 puff  1 puff Inhalation Daily Bobbitt, Shalon E, NP   1 puff at 12/09/23 0945   ketorolac  (ACULAR ) 0.5 % ophthalmic solution 2 drop  2  drop Both Eyes QID Bobbitt, Shalon E, NP   2 drop at 12/09/23 0944   lipase/protease/amylase (CREON ) capsule 12,000 Units  12,000 Units Oral TID AC Motley-Mangrum, Jadeka A, PMHNP   12,000 Units at 12/09/23 0738   multivitamin with minerals tablet 1 tablet  1 tablet Oral Daily  Bobbitt, Shalon E, NP   1 tablet at 12/09/23 0937   naproxen (NAPROSYN) tablet 500 mg  500 mg Oral BID WC Bobbitt, Shalon E, NP   500 mg at 12/09/23 9062   nicotine  (NICODERM CQ  - dosed in mg/24 hours) patch 14 mg  14 mg Transdermal Daily Bobbitt, Shalon E, NP   14 mg at 12/09/23 9062   ondansetron  (ZOFRAN ) tablet 4 mg  4 mg Oral Q8H PRN Bobbitt, Shalon E, NP       pantoprazole  (PROTONIX ) EC tablet 80 mg  80 mg Oral Daily Bobbitt, Shalon E, NP   80 mg at 12/09/23 0937   prednisoLONE  acetate (PRED FORTE ) 1 % ophthalmic suspension 1 drop  1 drop Both Eyes QID Bobbitt, Shalon E, NP   1 drop at 12/09/23 0946   simethicone  (MYLICON) chewable tablet 80 mg  80 mg Oral Q6H PRN Bobbitt, Shalon E, NP       traZODone (DESYREL) tablet 50 mg  50 mg Oral QHS PRN Bobbitt, Shalon E, NP   50 mg at 12/08/23 2125    Lab Results: No results found for this or any previous visit (from the past 48 hours).  Blood Alcohol  level:  Lab Results  Component Value Date   ETH <10 06/08/2019   ETH <10 11/27/2017    Metabolic Disorder Labs: Lab Results  Component Value Date   HGBA1C 5.2 06/09/2019   MPG 102.54 06/09/2019   No results found for: PROLACTIN Lab Results  Component Value Date   CHOL 93 11/27/2017   TRIG 94 02/25/2018   HDL 29 (L) 11/27/2017   CHOLHDL 3.2 11/27/2017   VLDL 28 11/27/2017   LDLCALC 36 11/27/2017    Physical Findings: AIMS:  , ,  ,  ,    CIWA:    COWS:      Psychiatric Specialty Exam:  Presentation  General Appearance:  Appropriate for Environment; Casual  Eye Contact: Fair  Speech: Normal Rate  Speech Volume: Decreased    Mood and Affect  Mood: Improving  Affect: Improving   Thought Process  Thought Processes: Linear  Orientation:Full (Time, Place and Person)  Thought Content:Illogical  Hallucinations: Denies  Ideas of Reference:None  Suicidal Thoughts: Denies Homicidal Thoughts: Denies   Sensorium  Memory: Immediate Fair; Recent Fair;  Remote Fair  Judgment: Impaired  Insight: Shallow   Executive Functions  Concentration: Poor  Attention Span: Poor  Recall: [employer] of Knowledge: Fair  Language: Fair   Psychomotor Activity  Psychomotor Activity: No data recorded  Musculoskeletal: Strength & Muscle Tone: within normal limits Gait & Station: normal Assets  Assets: Manufacturing Systems Engineer; Desire for Improvement; Physical Health; Resilience    Physical Exam: Physical Exam Vitals and nursing note reviewed.    ROS Blood pressure (!) 84/74, pulse 94, temperature (!) 97.5 F (36.4 C), resp. rate 18, height 5' 7 (1.702 m), weight 45.6 kg, SpO2 97%. Body mass index is 15.74 kg/m.  Diagnosis: Principal Problem:   MDD (major depressive disorder), recurrent episode, severe (HCC) Active Problems:   Major depressive disorder, recurrent severe without psychotic features [facility])  Clinical Decision Making: Patient is currently admitted for worsening depression and anxiety and suicidal  ideation in the context of having access to his prescribed medications including Prozac and Xanax  that worsened the anxiety.  Patient will be monitored closely for safety concerns.  Treatment Plan Summary:  Safety and Monitoring:             -- Voluntary admission to inpatient psychiatric unit for safety, stabilization and treatment             -- Daily contact with patient to assess and evaluate symptoms and progress in treatment             -- Patient's case to be discussed in multi-disciplinary team meeting             -- Observation Level: q15 minute checks             -- Vital signs:  q12 hours             -- Precautions: suicide, elopement, and assault   2. Psychiatric Diagnoses and Treatment:                Prozac 20 mg daily and  Xanax  1 mg 3 times daily as needed anxiety that patient has been taking for many years for his agoraphobia, social anxiety     -- The risks/benefits/side-effects/alternatives to  this medication were discussed in detail with the patient and time was given for questions. The patient consents to medication trial.                -- Metabolic profile and EKG monitoring obtained while on an atypical antipsychotic (BMI: Lipid Panel: HbgA1c: QTc:)              -- Encouraged patient to participate in unit milieu and in scheduled group therapies                            3. Medical Issues Being Addressed:  GJ tube stable 4. Discharge Planning:   -- Social work and case management to assist with discharge planning and identification of hospital follow-up needs prior to discharge  -- Estimated LOS: 3-4 days  Cassandre Oleksy, MD 12/09/2023, 11:41 AM

## 2023-12-09 NOTE — Group Note (Signed)
 Date:  12/09/2023 Time:  10:28 AM  Group Topic/Focus:  Emotional Education:   The focus of this group is to discuss what feelings/emotions are, and how they are experienced.  Emotional regulation is the ability to manage your emotions, not suppress them, by recognizing, understanding, and responding to them in healthy ways. It involves influencing which emotions you have, when you have them, and how you experience and express them to achieve a desired outcome, rather than simply reacting impulsively. For example, taking a deep breath when frustrated instead of throwing things is a form of emotional regulation.   Participation Level:  Did Not Attend   Brent [name] 12/09/2023, 10:28 AM

## 2023-12-09 NOTE — Group Note (Signed)
 Recreation Therapy Group Note   Group Topic:Coping Skills  Group Date: 12/09/2023 Start Time: 1100 End Time: 1130 Facilitators: Celestia Jeoffrey BRAVO, LRT, CTRS Location: Dayroom  Group Description: Music. Patients are encouraged to name their favorite song(s) for LRT to play song through speaker for group to hear. Patients educated on the definition of leisure and the importance of having different leisure interests outside of the hospital. Group discussed how leisure activities can often be used as pharmacologist and that listening to music and being outside are examples.    Goal Area(s) Addressed:  Patient will identify a current leisure interest.  Patient will practice making a positive decision. Patient will have the opportunity to try a new leisure activity.   Affect/Mood: N/A   Participation Level: Did not attend    Clinical Observations/Individualized Feedback: Patient did not attend group.   Plan: Continue to engage patient in RT group sessions 2-3x/week.   [address], LRT, CTRS 12/09/2023 1:34 PM

## 2023-12-09 NOTE — Progress Notes (Signed)
   12/08/23 2000  Psych Admission Type (Psych Patients Only)  Admission Status Voluntary  Psychosocial Assessment  Patient Complaints Anxiety;Depression  Eye Contact Brief  Facial Expression Sad  Affect Depressed  Speech Soft  Interaction Minimal  Motor Activity Slow  Appearance/Hygiene In scrubs  Behavior Characteristics Cooperative  Mood Pleasant  Thought Process  Coherency Circumstantial  Content WDL  Delusions None reported or observed  Perception WDL  Hallucination None reported or observed  Judgment Poor  Danger to Self  Agreement Not to Harm Self Yes  Description of Agreement verbal  Danger to Others  Danger to Others None reported or observed

## 2023-12-09 NOTE — Group Note (Signed)
 Date:  12/09/2023 Time:  9:10 PM  Group Topic/Focus:  Movie Group The purpose of this group is for patients to come together and watch a soothing movie of their liking while having snack with their peers    Participation Level:  Active  Participation Quality:  Appropriate  Affect:  Appropriate  Cognitive:  Appropriate  Insight: Appropriate  Engagement in Group:  Engaged  Modes of Intervention:  Activity  Additional Comments:    Beatris ONEIDA Hasten 12/09/2023, 9:10 PM

## 2023-12-09 NOTE — Plan of Care (Signed)
   Problem: Activity: Goal: Interest or engagement in activities will improve Outcome: Progressing Goal: Sleeping patterns will improve Outcome: Progressing

## 2023-12-10 ENCOUNTER — Other Ambulatory Visit: Payer: Self-pay

## 2023-12-10 DIAGNOSIS — F332 Major depressive disorder, recurrent severe without psychotic features: Secondary | ICD-10-CM | POA: Diagnosis not present

## 2023-12-10 MED ORDER — PREDNISOLONE ACETATE 1 % OP SUSP
1.0000 [drp] | Freq: Four times a day (QID) | OPHTHALMIC | 0 refills | Status: AC
  Filled 2023-12-10: qty 5, 25d supply, fill #0

## 2023-12-10 MED ORDER — ALPRAZOLAM 1 MG PO TABS
1.0000 mg | ORAL_TABLET | Freq: Three times a day (TID) | ORAL | 0 refills | Status: AC | PRN
Start: 2023-12-10 — End: 2024-01-09
  Filled 2023-12-10: qty 90, 30d supply, fill #0

## 2023-12-10 MED ORDER — OMEPRAZOLE 40 MG PO CPDR
40.0000 mg | DELAYED_RELEASE_CAPSULE | Freq: Every evening | ORAL | 0 refills | Status: AC
  Filled 2023-12-10: qty 30, 30d supply, fill #0

## 2023-12-10 MED ORDER — NICOTINE 14 MG/24HR TD PT24
14.0000 mg | MEDICATED_PATCH | Freq: Every day | TRANSDERMAL | 0 refills | Status: AC
  Filled 2023-12-10: qty 28, 28d supply, fill #0

## 2023-12-10 MED ORDER — NAPROXEN 500 MG PO TABS
500.0000 mg | ORAL_TABLET | Freq: Two times a day (BID) | ORAL | 0 refills | Status: AC
  Filled 2023-12-10: qty 30, 15d supply, fill #0

## 2023-12-10 MED ORDER — TRAZODONE HCL 100 MG PO TABS
100.0000 mg | ORAL_TABLET | Freq: Every day | ORAL | 0 refills | Status: AC
  Filled 2023-12-10: qty 30, 30d supply, fill #0

## 2023-12-10 MED ORDER — DOCUSATE SODIUM 100 MG PO CAPS
100.0000 mg | ORAL_CAPSULE | Freq: Every day | ORAL | 0 refills | Status: AC
  Filled 2023-12-10: qty 10, 10d supply, fill #0

## 2023-12-10 MED ORDER — FLUOXETINE HCL 10 MG PO TABS
10.0000 mg | ORAL_TABLET | Freq: Every day | ORAL | 0 refills | Status: AC
  Filled 2023-12-10: qty 30, 30d supply, fill #0

## 2023-12-10 MED ORDER — CREON 24000-76000 UNITS PO CPEP
1.0000 | ORAL_CAPSULE | ORAL | 0 refills | Status: AC
Start: 2023-12-10 — End: ?
  Filled 2023-12-10: qty 100, 15d supply, fill #0

## 2023-12-10 MED ORDER — SIMETHICONE 80 MG PO CHEW
80.0000 mg | CHEWABLE_TABLET | Freq: Four times a day (QID) | ORAL | 0 refills | Status: AC | PRN
  Filled 2023-12-10: qty 30, 8d supply, fill #0

## 2023-12-10 NOTE — Group Note (Signed)
 Date:  12/10/2023 Time:  9:10 PM  Group Topic/Focus:  Wrap-Up Group:   The focus of this group is to help patients review their daily goal of treatment and discuss progress on daily workbooks.    Participation Level:  Did Not Attend  Additional Comments:    Kristen VEAR Gibbon 12/10/2023, 9:10 PM

## 2023-12-10 NOTE — Group Note (Signed)
 Date:  12/10/2023 Time:  11:27 PM  Group Topic/Focus:  Self Care:   The focus of this group is to help patients understand the importance of self-care in order to improve or restore emotional, physical, spiritual, interpersonal, and financial health.    Participation Level:  Did Not Attend  Participation Quality:  Did Not Attend  Affect:  Did Not Attend  Cognitive:  Did Not Attend  Insight: None  Engagement in Group:  None  Modes of Intervention:  Education  Additional Comments:    Brent [name] 12/10/2023, 11:27 PM

## 2023-12-10 NOTE — Progress Notes (Signed)
   12/10/23 1100  Psych Admission Type (Psych Patients Only)  Admission Status Voluntary  Psychosocial Assessment  Patient Complaints Anxiety  Eye Contact Brief  Facial Expression Sad  Affect Sad  Speech Soft  Interaction Minimal  Motor Activity Slow  Appearance/Hygiene In scrubs  Behavior Characteristics Cooperative  Mood Pleasant  Thought Process  Coherency Disorganized  Content WDL  Delusions None reported or observed  Perception WDL  Hallucination None reported or observed  Judgment Poor  Confusion None  Danger to Self  Current suicidal ideation? Denies  Agreement Not to Harm Self Yes  Description of Agreement verbal  Danger to Others  Danger to Others None reported or observed

## 2023-12-10 NOTE — Progress Notes (Signed)
   12/10/23 2300  Psych Admission Type (Psych Patients Only)  Admission Status Voluntary  Psychosocial Assessment  Patient Complaints Anxiety  Eye Contact Brief  Facial Expression Sad  Affect Sad  Speech Soft  Interaction Minimal;Isolative  Motor Activity Slow  Appearance/Hygiene In scrubs;Disheveled  Behavior Characteristics Cooperative  Mood Anxious  Thought Process  Coherency Disorganized  Content WDL  Delusions None reported or observed  Perception WDL  Hallucination None reported or observed  Judgment Poor  Confusion None  Danger to Self  Current suicidal ideation? Denies  Agreement Not to Harm Self Yes  Description of Agreement Verbal  Danger to Others  Danger to Others None reported or observed

## 2023-12-10 NOTE — Group Note (Signed)
 Date:  12/10/2023 Time:  10:13 AM  Group Topic/Focus:  Community Meeting    Participation Level:  Did Not Attend    Brent [name] Bias 12/10/2023, 10:13 AM

## 2023-12-10 NOTE — Plan of Care (Signed)
   Problem: Education: Goal: Knowledge of [facility] Education information/materials will improve Outcome: Progressing Goal: Emotional status will improve Outcome: Progressing Goal: Mental status will improve Outcome: Progressing Goal: Verbalization of understanding the information provided will improve Outcome: Progressing

## 2023-12-10 NOTE — Progress Notes (Signed)
 Patient cooperative with treatment, he denies SI, HI & AVH. Patient still endorses depression, but he was medication compliant.

## 2023-12-10 NOTE — [facility] Counselor (Signed)
 CSW provided pt phone number for his [employer] rides as pt reports he does not have access to transportation.   Pt reports he has used all of his rides for the year.   CSW then provided pt information for [facility] [employer].   Pt should be able to schedule rides from 6AM to 6PM for medical appointments for a fare of $1 to $3 dollars.    Lum Croft, MSW, [place] 12/10/2023 1:50 PM

## 2023-12-10 NOTE — Plan of Care (Signed)
  Problem: Activity: Goal: Sleeping patterns will improve Outcome: Progressing   

## 2023-12-10 NOTE — Progress Notes (Signed)
 [facility] MD Progress Note  12/10/2023 12:25 PM Brent [name]  MRN:  [id number]  Brent [name] is a 53 y.o. 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  He was started on liquid Prozac and appeared to be tolerating this well and was otherwise stable for discharge at that time, however it appears that he was not able to obtain this medication.  Additionally, he has some suicidal thoughts and wanted to be evaluated. Patient is admitted to [facility] unit with Q15 min safety monitoring. Multidisciplinary team approach is offered. Medication management; group/milieu therapy is offered.  Subjective:  Chart reviewed, case discussed in multidisciplinary meeting, patient seen during rounds.  Patient is noted to be resting in bed.  He offers no complaints.  He reports feeling better since his admission.  He denies feeling depressed or anxious.  He did complain about poor sleep but reports having 4 to 6 hours of sleep most of the nights even at home.  Patient was taking trazodone at some point at home with good benefit.  Patient denies SI/HI/plan.  Patient denies auditory/visual hallucinations.  Patient is encouraged to follow-up with his outpatient appointments Sleep: Fair  Appetite:  Fair  Past Psychiatric History: see h&P Family History:  Family History  Problem Relation Age of Onset   Hypertension Mother    Breast cancer Mother    Melanoma Mother    Stroke Mother    Lung cancer Father 67   Colon cancer Neg Hx    Colitis Neg Hx    Cirrhosis Neg Hx    Liver disease Neg Hx    Pancreatic cancer Neg Hx    Pancreatitis Neg Hx    Anesthesia problems Neg Hx    Hypotension Neg Hx    Malignant hyperthermia Neg Hx    Pseudochol deficiency Neg Hx    Social History:  Social History   Substance and Sexual Activity  Alcohol  Use  Not Currently     Social History   Substance and Sexual Activity  Drug Use No    Social History   Socioeconomic History   Marital status: Single    Spouse name: Not on file   Number of children: 0   Years of education: Not on file   Highest education level: Not on file  Occupational History   Occupation: disabled    Comment: anxiety  Tobacco Use   Smoking status: Every Day    Current packs/day: 1.00    Average packs/day: 1 pack/day for 21.7 years (21.7 ttl pk-yrs)    Types: Cigarettes, Cigars    Start date: 03/2002   Smokeless tobacco: Never   Tobacco comments:    1 pack per week now as of 02/24/2018   Vaping Use   Vaping status: Never Used  Substance and Sexual Activity   Alcohol  use: Not Currently   Drug use: No   Sexual activity: Not on file  Other Topics Concern   Not on file  Social History Narrative   Right handed   Caffeine use: mostly water /OJ, tea sometimes.    Social Drivers of Corporate Investment Banker Strain: Low Risk  (02/24/2018)   Overall Financial Resource Strain (CARDIA)    Difficulty of Paying Living Expenses: Not very hard  Food Insecurity: No Food Insecurity (12/06/2023)   Hunger Vital Sign    Worried About Running Out of Food in the  Last Year: Never true    Ran Out of Food in the Last Year: Never true  Recent Concern: Food Insecurity - Food Insecurity Present (12/03/2023)   Hunger Vital Sign    Worried About Running Out of Food in the Last Year: Often true    Ran Out of Food in the Last Year: Often true  Transportation Needs: Unmet Transportation Needs (12/06/2023)   PRAPARE - Administrator, Civil Service (Medical): Yes    Lack of Transportation (Non-Medical): Yes  Physical Activity: Not on file  Stress: Not on file  Social Connections: Socially Isolated (12/06/2023)   Social Connection and Isolation Panel    Frequency of Communication with Friends and Family: Once a week    Frequency of Social Gatherings with Friends and  Family: Once a week    Attends Religious Services: Never    Database Administrator or Organizations: No    Attends Engineer, Structural: Never    Marital Status: Never married   Past Medical History:  Past Medical History:  Diagnosis Date   Alcohol  use    Anxiety    Disabled due to panic attacks   Arthritis    Bilateral ureteral calculi    Borderline type 2 diabetes mellitus    BPH (benign prostatic hypertrophy)    Chronic back pain    Condylomata acuminata in male    multiple procedures --  penile, peri-rectal , perineum   DDD (degenerative disc disease), cervical    Depression    Dyspnea on exertion    Emphysematous COPD (HCC)    Epiretinal membrane (ERM) of both eyes    Fibromyalgia    GERD (gastroesophageal reflux disease)    Headache(784.0)    History of chronic pancreatitis    severeal adx for this /  2008  dx alcoholic pancreatitis   History of esophageal dilatation    History of hiatal hernia    History of kidney stones    History of panic attacks    History of sepsis    adx 05-01-2014--  urosepsis due to kidney stones obstruction/ hydronephrosis   History of suicidal ideation    2006   adx   Hypertension    was on medication for short time, htn was caused by prednisone  per pt   Multiple sclerosis    pt. states has 4 small brain lesions   Nephrolithiasis    bilateral    Retinal vasculitis    Uveitis     Past Surgical History:  Procedure Laterality Date   BIOPSY  11/12/2012   Procedure: GASTRIC AND ESOPHAGEAL BIOPSIES;  Surgeon: Lamar CHRISTELLA Hollingshead, MD;  Location: [facility];  Service: Endoscopy;;   CATARACT EXTRACTION W/ INTRAOCULAR LENS  IMPLANT, BILATERAL     COLONOSCOPY  10/08/2011   Jenkins:Normal colon/Anal condyloma without extension proximal to dentate line   CYSTOSCOPY W/ URETERAL STENT PLACEMENT Bilateral 05/01/2014   Procedure: CYSTOSCOPY WITH BILATERAL RETROGRADE PYELOGRAM; BILATERAL URETERAL STENT PLACEMENT;  Surgeon: Alm Fragmin, MD;  Location:  [facility];  Service: Urology;  Laterality: Bilateral;   CYSTOSCOPY W/ URETERAL STENT REMOVAL Bilateral 06/06/2014   Procedure: CYSTOSCOPY WITH STENT REMOVAL;  Surgeon: Garnette Shack, MD;  Location: [facility];  Service: Urology;  Laterality: Bilateral;   CYSTOSCOPY WITH URETEROSCOPY  06/06/2014   Procedure: CYSTOSCOPY WITH URETEROSCOPY;  Surgeon: Garnette Shack, MD;  Location: [facility];  Service: Urology;;   CYSTOSCOPY WITH URETEROSCOPY AND STENT PLACEMENT Bilateral 06/06/2014   Procedure: CYSTOSCOPY  WITH J2 STENT EXTRACTION,,URETEROSCOPY WITH EXTRACTION OF STONES,;  Surgeon: Garnette Shack, MD;  Location: [facility];  Service: Urology;  Laterality: Bilateral;   ELECTROCAUTERY/ DESICCATION OF CONDYLOMA LESIONS  01-15-2008  &  12-29-2009   PENIS, PERI-RECTAL , PERINUEM   ESOPHAGOGASTRODUODENOSCOPY (EGD) WITH PROPOFOL  N/A 11/12/2012   MFM:Wnmfjo esophagus s/p  passage of a Maloney dilator and biopsy. Abnormal gastric mucosa-status post biopsy   FLEXIBLE BRONCHOSCOPY N/A 08/12/2012   Procedure: FLEXIBLE BRONCHOSCOPY;  Surgeon: Dallas LITTIE Gelineau, MD;  Location: [facility];  Service: Pulmonary;  Laterality: N/A;   MALONEY DILATION N/A 11/12/2012   Procedure: MALONEY DILATION (54mm);  Surgeon: Lamar CHRISTELLA Hollingshead, MD;  Location: [facility];  Service: Endoscopy;  Laterality: N/A;   MULTIPLE EXTRACTIONS WITH ALVEOLOPLASTY N/A 04/22/2014   Procedure: MULTIPLE EXTRACTIONS ( 2,3,5,6,7,8,9,10,11,13,14,15,21,28  WITH ALVEOLOPLASTY;  Surgeon: Glendia Primrose, DDS;  Location: [facility];  Service: Oral Surgery;  Laterality: N/A;   WISDOM TOOTH EXTRACTION      Current Medications: Current Facility-Administered Medications  Medication Dose Route Frequency Provider Last Rate Last Admin   acetaminophen  (TYLENOL ) tablet 650 mg  650 mg Oral Q6H PRN Bobbitt, Shalon E, NP   650 mg at 12/10/23 1117   ALPRAZolam  (XANAX ) tablet 1 mg  1 mg Oral TID PRN Rufus Cypert, MD   1 mg at 12/10/23  0942   alum & mag hydroxide-simeth (MAALOX/MYLANTA) 200-200-20 MG/5ML suspension 30 mL  30 mL Oral Q4H PRN Lewis, Tanika N, NP       cholecalciferol  (VITAMIN D3) 25 MCG (1000 UNIT) tablet 1,000 Units  1,000 Units Oral Daily Bobbitt, Shalon E, NP   1,000 Units at 12/10/23 0942   cyanocobalamin (VITAMIN B12) tablet 2,000 mcg  2,000 mcg Oral Daily Bobbitt, Shalon E, NP   2,000 mcg at 12/10/23 9057   haloperidol (HALDOL) tablet 5 mg  5 mg Oral TID PRN Lewis, Tanika N, NP       And   diphenhydrAMINE (BENADRYL) capsule 50 mg  50 mg Oral TID PRN Ezzard Staci SAILOR, NP       haloperidol lactate (HALDOL) injection 5 mg  5 mg Intramuscular TID PRN Lewis, Tanika N, NP       And   diphenhydrAMINE (BENADRYL) injection 50 mg  50 mg Intramuscular TID PRN Ezzard Staci SAILOR, NP       And   LORazepam  (ATIVAN ) injection 2 mg  2 mg Intramuscular TID PRN Ezzard Staci SAILOR, NP       haloperidol lactate (HALDOL) injection 10 mg  10 mg Intramuscular TID PRN Lewis, Tanika N, NP       And   diphenhydrAMINE (BENADRYL) injection 50 mg  50 mg Intramuscular TID PRN Ezzard Staci SAILOR, NP       And   LORazepam  (ATIVAN ) injection 2 mg  2 mg Intramuscular TID PRN Ezzard Staci SAILOR, NP       docusate sodium (COLACE) capsule 100 mg  100 mg Oral Daily Bobbitt, Shalon E, NP   100 mg at 12/10/23 0942   feeding supplement (KATE FARMS STANDARD 1.4) liquid 325 mL  325 mL Oral BID BM Phoenix Dresser, MD   325 mL at 12/10/23 0947   FLUoxetine (PROZAC) capsule 10 mg  10 mg Oral Daily Bobbitt, Shalon E, NP   10 mg at 12/10/23 0942   fluticasone  furoate-vilanterol (BREO ELLIPTA) 100-25 MCG/ACT 1 puff  1 puff Inhalation Daily Bobbitt, Shalon E, NP   1 puff at 12/10/23 0941   ketorolac  (ACULAR ) 0.5 %  ophthalmic solution 2 drop  2 drop Both Eyes QID Bobbitt, Shalon E, NP   2 drop at 12/10/23 0940   lipase/protease/amylase (CREON ) capsule 12,000 Units  12,000 Units Oral TID AC Motley-Mangrum, Jadeka A, PMHNP   12,000 Units at 12/10/23 1115   multivitamin  with minerals tablet 1 tablet  1 tablet Oral Daily Bobbitt, Shalon E, NP   1 tablet at 12/10/23 0942   naproxen (NAPROSYN) tablet 500 mg  500 mg Oral BID WC Bobbitt, Shalon E, NP   500 mg at 12/10/23 0730   nicotine  (NICODERM CQ  - dosed in mg/24 hours) patch 14 mg  14 mg Transdermal Daily Bobbitt, Shalon E, NP   14 mg at 12/10/23 0940   ondansetron  (ZOFRAN ) tablet 4 mg  4 mg Oral Q8H PRN Bobbitt, Shalon E, NP       pantoprazole  (PROTONIX ) EC tablet 80 mg  80 mg Oral Daily Bobbitt, Shalon E, NP   80 mg at 12/10/23 0942   prednisoLONE  acetate (PRED FORTE ) 1 % ophthalmic suspension 1 drop  1 drop Both Eyes QID Bobbitt, Shalon E, NP   1 drop at 12/10/23 0940   simethicone  (MYLICON) chewable tablet 80 mg  80 mg Oral Q6H PRN Bobbitt, Shalon E, NP   80 mg at 12/10/23 1115   traZODone (DESYREL) tablet 50 mg  50 mg Oral QHS PRN Bobbitt, Shalon E, NP   50 mg at 12/09/23 2137    Lab Results: No results found for this or any previous visit (from the past 48 hours).  Blood Alcohol  level:  Lab Results  Component Value Date   ETH <10 06/08/2019   ETH <10 11/27/2017    Metabolic Disorder Labs: Lab Results  Component Value Date   HGBA1C 5.2 06/09/2019   MPG 102.54 06/09/2019   No results found for: PROLACTIN Lab Results  Component Value Date   CHOL 93 11/27/2017   TRIG 94 02/25/2018   HDL 29 (L) 11/27/2017   CHOLHDL 3.2 11/27/2017   VLDL 28 11/27/2017   LDLCALC 36 11/27/2017    Physical Findings: AIMS:  , ,  ,  ,    CIWA:    COWS:      Psychiatric Specialty Exam:  Presentation  General Appearance:  Appropriate for Environment; Casual  Eye Contact: Fair  Speech: Normal Rate  Speech Volume: Decreased    Mood and Affect  Mood: Improving  Affect: Improving   Thought Process  Thought Processes: Linear  Orientation:Full (Time, Place and Person)  Thought Content:Illogical  Hallucinations: Denies  Ideas of Reference:None  Suicidal Thoughts: Denies Homicidal  Thoughts: Denies   Sensorium  Memory: Immediate Fair; Recent Fair; Remote Fair  Judgment: Impaired  Insight: Shallow   Executive Functions  Concentration: Poor  Attention Span: Poor  Recall: [employer] of Knowledge: Fair  Language: Fair   Psychomotor Activity  Psychomotor Activity: No data recorded  Musculoskeletal: Strength & Muscle Tone: within normal limits Gait & Station: normal Assets  Assets: Manufacturing Systems Engineer; Desire for Improvement; Physical Health; Resilience    Physical Exam: Physical Exam Vitals and nursing note reviewed.    ROS Blood pressure 95/73, pulse 81, temperature 97.9 F (36.6 C), resp. rate 16, height 5' 7 (1.702 m), weight 45.6 kg, SpO2 100%. Body mass index is 15.74 kg/m.  Diagnosis: Principal Problem:   MDD (major depressive disorder), recurrent episode, severe (HCC) Active Problems:   Major depressive disorder, recurrent severe without psychotic features [facility])  Clinical Decision Making: Patient is currently admitted  for worsening depression and anxiety and suicidal ideation in the context of having access to his prescribed medications including Prozac and Xanax  that worsened the anxiety.  Patient will be monitored closely for safety concerns.  Treatment Plan Summary:  Safety and Monitoring:             -- Voluntary admission to inpatient psychiatric unit for safety, stabilization and treatment             -- Daily contact with patient to assess and evaluate symptoms and progress in treatment             -- Patient's case to be discussed in multi-disciplinary team meeting             -- Observation Level: q15 minute checks             -- Vital signs:  q12 hours             -- Precautions: suicide, elopement, and assault   2. Psychiatric Diagnoses and Treatment:                Prozac 20 mg daily and  Xanax  1 mg 3 times daily as needed anxiety that patient has been taking for many years for his agoraphobia, social  anxiety     -- The risks/benefits/side-effects/alternatives to this medication were discussed in detail with the patient and time was given for questions. The patient consents to medication trial.                -- Metabolic profile and EKG monitoring obtained while on an atypical antipsychotic (BMI: Lipid Panel: HbgA1c: QTc:)              -- Encouraged patient to participate in unit milieu and in scheduled group therapies                            3. Medical Issues Being Addressed:  GJ tube stable 4. Discharge Planning:   -- Social work and case management to assist with discharge planning and identification of hospital follow-up needs prior to discharge  -- Estimated LOS: 3-4 days  Allyn Foil, MD 12/10/2023, 12:25 PM

## 2023-12-11 DIAGNOSIS — F332 Major depressive disorder, recurrent severe without psychotic features: Secondary | ICD-10-CM | POA: Diagnosis not present

## 2023-12-11 NOTE — Progress Notes (Signed)
 Nutrition Brief Note  Pt has not been receiving any tube feeds in hospital as tube feeds are unable to be given in the [facility] unit. Pt is to resume his home tube feed regimen after discharge.   Home Regimen   Nutren 2.0- Give 4 cartons total per day. Please administer each carton via feeding pump @100ml /hr via the J-port. Flush tube with 60ml of water  before and after each feed.   Regimen provides 2000kcal/day, 84g/day protein and 875ml/day of free water .   Augustin Shams MS, RD, LDN If unable to be reached, please send secure chat to RD inpatient available from 8:00a-4:00p daily

## 2023-12-11 NOTE — Plan of Care (Signed)
  Problem: Education: Goal: Emotional status will improve Outcome: Progressing Goal: Mental status will improve Outcome: Progressing   Problem: Coping: Goal: Ability to demonstrate self-control will improve Outcome: Progressing

## 2023-12-11 NOTE — Group Note (Signed)
 [facility] LCSW Group Therapy Note   Group Date: 12/11/2023 Start Time: 1330 End Time: 1400   Type of Therapy/Topic:  Group Therapy:  Emotion Regulation  Participation Level:  Minimal   Mood:  Description of Group:    The purpose of this group is to assist patients in learning to regulate negative emotions and experience positive emotions. Patients will be guided to discuss ways in which they have been vulnerable to their negative emotions. These vulnerabilities will be juxtaposed with experiences of positive emotions or situations, and patients challenged to use positive emotions to combat negative ones. Special emphasis will be placed on coping with negative emotions in conflict situations, and patients will process healthy conflict resolution skills.  Therapeutic Goals: Patient will identify two positive emotions or experiences to reflect on in order to balance out negative emotions:  Patient will label two or more emotions that they find the most difficult to experience:  Patient will be able to demonstrate positive conflict resolution skills through discussion or role plays:   Summary of Patient Progress:   Pt was moderately active during group. Pt was appropriate throughout group. Pt open to feedback from group facilitator and peers.     Therapeutic Modalities:   Cognitive Behavioral Therapy Feelings Identification Dialectical Behavioral Therapy   Lum [name], LCSWA

## 2023-12-11 NOTE — Plan of Care (Signed)
  Problem: Education: Goal: Emotional status will improve Outcome: Progressing   Problem: Coping: Goal: Ability to demonstrate self-control will improve Outcome: Progressing   

## 2023-12-11 NOTE — Group Note (Signed)
 Date:  12/11/2023 Time:  10:55 AM  Group Topic/Focus:  Healthy Communication:   The focus of this group is to discuss communication, barriers to communication, as well as healthy ways to communicate with others.    Participation Level:  Did Not Attend  Brent [name] 12/11/2023, 10:55 AM

## 2023-12-11 NOTE — Progress Notes (Signed)
 Discharge Note:  Patient denies SI/HI/AVH at this time. Discharge instructions, AVS, prescriptions, and transition record reviewed with patient.  Patient agrees to comply with medication management, follow-up visit, and outpatient therapy.  Patient belongings returned to patient.  Patient questions and concerns addressed and answered. Patient ambulatory off unit. Patient discharged to home via cab voucher.  Patient completed suicide safety plan.  Copy sent home with discharge paperwork.

## 2023-12-11 NOTE — Progress Notes (Signed)
 Behavior:   Pleasant and cooperative.   Psych assessment: Flat affect.  Endorses anxiety.  Denies SI/HI and AVH.   Interaction / Group attendance:  Isolates to room with the exception of meals.  Minimal interaction.  Medication/ PRNs:  Compliant.  PRNs for anxiety and pain given as ordered.   Pain:  6/10 head  15 min checks in place for safety.

## 2023-12-11 NOTE — Progress Notes (Signed)
  [facility] Adult Case Management Discharge Plan :  Will you be returning to the same living situation after discharge:  Yes,  pt will return home  At discharge, do you have transportation home?: Yes,  CSW will assist with transportation  Do you have the ability to pay for your medications: Yes,  [employer] MEDICARE / DREMA DUAL COMPLETE  Release of information consent forms completed and in the chart;  Patient's signature needed at discharge.  Patient to Follow up at:  Follow-up Information     [facility] [place]. Go on 01/09/2024.   Why: Your appointment is scheduled for 01/09/24 at 1:20 PM . You will be discussing home health as well as establishing care at this appointment along with your other medical needs. Please remember to bring your updated insurance cards. Contact information: [address] [place], [place] [address] [phone]        [facility]. Call on 12/17/2023.   Why: Appt is 11/5 @ 9:30am virtually Contact information: [address]  [address] [place] [address] [phone]                 Next level of care provider has access to [facility] Link:no  Safety Planning and Suicide Prevention discussed: Yes,  Shravan Salahuddin, brother, [phone]     Has patient been referred to the Quitline?: Patient refused referral for treatment  Patient has been referred for addiction treatment: No known substance use disorder.  [address] LCSWA 12/11/2023, 9:16 AM

## 2023-12-11 NOTE — Group Note (Signed)
 Recreation Therapy Group Note   Group Topic:Relaxation  Group Date: 12/11/2023 Start Time: 1105 End Time: 1130 Facilitators: Celestia Jeoffrey BRAVO, LRT, CTRS Location: Dayroom  Group Description: Meditation. LRT and patients discussed what they know about meditation and mindfulness. LRT played a Deep Breathing Meditation exercise script for patients to follow along to. LRT and patients discussed how meditation and deep breathing can be used as a coping skill post--discharge to help manage symptoms of stress.   Goal Area(s) Addressed: Patient will practice using relaxation technique. Patient will identify a new coping skill.  Patient will follow multistep directions to reduce anxiety and stress.  Affect/Mood: N/A   Participation Level: Did not attend    Clinical Observations/Individualized Feedback: Patient did not attend group.   Plan: Continue to engage patient in RT group sessions 2-3x/week.   Jeoffrey BRAVO Celestia, LRT, CTRS 12/11/2023 2:03 PM

## 2023-12-11 NOTE — Group Note (Signed)
 Recreation Therapy Group Note   Group Topic:Coping Skills  Group Date: 12/11/2023 Start Time: 1500 End Time: 1535 Facilitators: Celestia Jeoffrey BRAVO, LRT, CTRS Location: Courtyard  Group Description: Music. Patients are encouraged to name their favorite song(s) for LRT to play song through speaker for group to hear, while in the courtyard getting fresh air and sunlight. Patients educated on the definition of leisure and the importance of having different leisure interests outside of the hospital. Group discussed how leisure activities can often be used as pharmacologist and that listening to music and being outside are examples.    Goal Area(s) Addressed:  Patient will identify a current leisure interest.  Patient will practice making a positive decision. Patient will have the opportunity to try a new leisure activity.   Affect/Mood: N/A   Participation Level: Did not attend    Clinical Observations/Individualized Feedback: Patient did not attend group.   Plan: Continue to engage patient in RT group sessions 2-3x/week.   Jeoffrey BRAVO Celestia, LRT, CTRS 12/11/2023 4:50 PM

## 2023-12-11 NOTE — [facility] Suicide Risk Assessment (Signed)
 [facility] Discharge Suicide Risk Assessment   Principal Problem: MDD (major depressive disorder), recurrent episode, severe (HCC) Discharge Diagnoses: Principal Problem:   MDD (major depressive disorder), recurrent episode, severe (HCC) Active Problems:   Major depressive disorder, recurrent severe without psychotic features (HCC)   Total Time spent with patient: 30 minutes  Musculoskeletal: Strength & Muscle Tone: within normal limits Gait & Station: normal Patient leans: N/A  Psychiatric Specialty Exam  Presentation  General Appearance:  Appropriate for Environment; Casual  Eye Contact: Fair  Speech: Normal Rate  Speech Volume: Decreased  Handedness: Right   Mood and Affect  Mood: Dysphoric; Anxious  Duration of Depression Symptoms: No data recorded Affect: Depressed; Flat   Thought Process  Thought Processes: Linear  Descriptions of Associations:Intact  Orientation:Full (Time, Place and Person)  Thought Content:Illogical  History of Schizophrenia/Schizoaffective disorder:No data recorded Duration of Psychotic Symptoms:No data recorded Hallucinations:No data recorded Ideas of Reference:None  Suicidal Thoughts:No data recorded Homicidal Thoughts:No data recorded  Sensorium  Memory: Immediate Fair; Recent Fair; Remote Fair  Judgment: Impaired  Insight: Shallow   Executive Functions  Concentration: Poor  Attention Span: Poor  Recall: Fair  Fund of Knowledge: Fair  Language: Fair   Psychomotor Activity  Psychomotor Activity:No data recorded  Assets  Assets: Communication Skills; Desire for Improvement; Physical Health; Resilience   Sleep  Sleep:No data recorded Estimated Sleeping Duration (Last 24 Hours): 11.50-13.25 hours  Physical Exam: Physical Exam ROS Blood pressure 100/64, pulse (!) 108, temperature 98.2 F (36.8 C), resp. rate 17, height 5' 7 (1.702 m), weight 45.6 kg, SpO2 99%. Body mass index is 15.74  kg/m.  Mental Status Per Nursing Assessment::   On Admission:  NA  Demographic Factors:  Male  Loss Factors: Decrease in vocational status  Historical Factors: Impulsivity  Risk Reduction Factors:   Living with another person, especially a relative, Positive social support, Positive therapeutic relationship, and Positive coping skills or problem solving skills  Continued Clinical Symptoms:  Depression:   Impulsivity  Cognitive Features That Contribute To Risk:  None    Suicide Risk:  Minimal: No identifiable suicidal ideation.  Patients presenting with no risk factors but with morbid ruminations; may be classified as minimal risk based on the severity of the depressive symptoms   Follow-up Information     [facility] [place]. Go on 01/09/2024.   Why: Your appointment is scheduled for 01/09/24 at 1:20 PM . You will be discussing home health as well as establishing care at this appointment along with your other medical needs. Please remember to bring your updated insurance cards. Contact information: [address] [place], [place] [address] [phone]        [facility]. Call on 12/17/2023.   Why: Appt is 11/5 @ 9:30am virtually Contact information: [address]  [address] [place] [address] [phone]                 Plan Of Care/Follow-up recommendations:  Activity:  As tolerated  Allyn Foil, MD 12/11/2023, 12:21 PM

## 2023-12-11 NOTE — Plan of Care (Signed)
   Problem: Education: Goal: Emotional status will improve Outcome: Not Progressing Goal: Mental status will improve Outcome: Not Progressing   Problem: Activity: Goal: Interest or engagement in activities will improve Outcome: Not Progressing

## 2023-12-11 NOTE — Discharge Summary (Signed)
 Physician Discharge Summary Note  Patient:  Brent [name] is an 53 y.o., male MRN:  [id number] DOB:  [birth year] Patient phone:  [phone] (home)  Patient address:   [address] [place] [address],   Total time spent: 40 min Date of Admission:  12/06/2023 Date of Discharge: 12/11/23  Reason for Admission:  Brent [name] is a 53 y.o. 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  He was started on liquid Prozac and appeared to be tolerating this well and was otherwise stable for discharge at that time, however it appears that he was not able to obtain this medication.  Additionally, he has some suicidal thoughts and wanted to be evaluated. Patient is admitted to [facility] unit with Q15 min safety monitoring. Multidisciplinary team approach is offered. Medication management; group/milieu therapy is offered.   Principal Problem: MDD (major depressive disorder), recurrent episode, severe (HCC) Discharge Diagnoses: Principal Problem:   MDD (major depressive disorder), recurrent episode, severe (HCC) Active Problems:   Major depressive disorder, recurrent severe without psychotic features (HCC)   Past Psychiatric History: see H&P  Family Psychiatric  History: see h&p Social History:  Social History   Substance and Sexual Activity  Alcohol  Use Not Currently     Social History   Substance and Sexual Activity  Drug Use No    Social History   Socioeconomic History   Marital status: Single    Spouse name: Not on file   Number of children: 0   Years of education: Not on file   Highest education level: Not on file  Occupational History   Occupation: disabled    Comment: anxiety  Tobacco Use   Smoking status: Every Day    Current packs/day: 1.00    Average packs/day: 1 pack/day for  21.7 years (21.7 ttl pk-yrs)    Types: Cigarettes, Cigars    Start date: 03/2002   Smokeless tobacco: Never   Tobacco comments:    1 pack per week now as of 02/24/2018   Vaping Use   Vaping status: Never Used  Substance and Sexual Activity   Alcohol  use: Not Currently   Drug use: No   Sexual activity: Not on file  Other Topics Concern   Not on file  Social History Narrative   Right handed   Caffeine use: mostly water /OJ, tea sometimes.    Social Drivers of Corporate Investment Banker Strain: Low Risk  (02/24/2018)   Overall Financial Resource Strain (CARDIA)    Difficulty of Paying Living Expenses: Not very hard  Food Insecurity: No Food Insecurity (12/06/2023)   Hunger Vital Sign    Worried About Running Out of Food in the Last Year: Never true    Ran Out of Food in the Last Year: Never true  Recent Concern: Food Insecurity - Food Insecurity Present (12/03/2023)   Hunger Vital Sign    Worried About Programme Researcher, Broadcasting/film/video in the Last Year: Often true    Ran Out of Food in the Last Year: Often true  Transportation Needs: Unmet Transportation Needs (12/06/2023)   PRAPARE - Administrator, Civil Service (Medical): Yes    Lack of Transportation (Non-Medical): Yes  Physical Activity: Not on file  Stress: Not on file  Social Connections: Socially Isolated (12/06/2023)   Social Connection and Isolation Panel    Frequency of Communication with  Friends and Family: Once a week    Frequency of Social Gatherings with Friends and Family: Once a week    Attends Religious Services: Never    Database Administrator or Organizations: No    Attends Engineer, Structural: Never    Marital Status: Never married   Past Medical History:  Past Medical History:  Diagnosis Date   Alcohol  use    Anxiety    Disabled due to panic attacks   Arthritis    Bilateral ureteral calculi    Borderline type 2 diabetes mellitus    BPH (benign prostatic hypertrophy)    Chronic back pain     Condylomata acuminata in male    multiple procedures --  penile, peri-rectal , perineum   DDD (degenerative disc disease), cervical    Depression    Dyspnea on exertion    Emphysematous COPD (HCC)    Epiretinal membrane (ERM) of both eyes    Fibromyalgia    GERD (gastroesophageal reflux disease)    Headache(784.0)    History of chronic pancreatitis    severeal adx for this /  2008  dx alcoholic pancreatitis   History of esophageal dilatation    History of hiatal hernia    History of kidney stones    History of panic attacks    History of sepsis    adx 05-01-2014--  urosepsis due to kidney stones obstruction/ hydronephrosis   History of suicidal ideation    2006   adx   Hypertension    was on medication for short time, htn was caused by prednisone  per pt   Multiple sclerosis    pt. states has 4 small brain lesions   Nephrolithiasis    bilateral    Retinal vasculitis    Uveitis     Past Surgical History:  Procedure Laterality Date   BIOPSY  11/12/2012   Procedure: GASTRIC AND ESOPHAGEAL BIOPSIES;  Surgeon: Lamar CHRISTELLA Hollingshead, MD;  Location: [facility];  Service: Endoscopy;;   CATARACT EXTRACTION W/ INTRAOCULAR LENS  IMPLANT, BILATERAL     COLONOSCOPY  10/08/2011   Jenkins:Normal colon/Anal condyloma without extension proximal to dentate line   CYSTOSCOPY W/ URETERAL STENT PLACEMENT Bilateral 05/01/2014   Procedure: CYSTOSCOPY WITH BILATERAL RETROGRADE PYELOGRAM; BILATERAL URETERAL STENT PLACEMENT;  Surgeon: Alm Fragmin, MD;  Location: [facility];  Service: Urology;  Laterality: Bilateral;   CYSTOSCOPY W/ URETERAL STENT REMOVAL Bilateral 06/06/2014   Procedure: CYSTOSCOPY WITH STENT REMOVAL;  Surgeon: Garnette Shack, MD;  Location: [facility];  Service: Urology;  Laterality: Bilateral;   CYSTOSCOPY WITH URETEROSCOPY  06/06/2014   Procedure: CYSTOSCOPY WITH URETEROSCOPY;  Surgeon: Garnette Shack, MD;  Location: [facility];  Service: Urology;;    CYSTOSCOPY WITH URETEROSCOPY AND STENT PLACEMENT Bilateral 06/06/2014   Procedure: CYSTOSCOPY WITH J2 STENT EXTRACTION,,URETEROSCOPY WITH EXTRACTION OF STONES,;  Surgeon: Garnette Shack, MD;  Location: [facility];  Service: Urology;  Laterality: Bilateral;   ELECTROCAUTERY/ DESICCATION OF CONDYLOMA LESIONS  01-15-2008  &  12-29-2009   PENIS, PERI-RECTAL , PERINUEM   ESOPHAGOGASTRODUODENOSCOPY (EGD) WITH PROPOFOL  N/A 11/12/2012   MFM:Wnmfjo esophagus s/p  passage of a Maloney dilator and biopsy. Abnormal gastric mucosa-status post biopsy   FLEXIBLE BRONCHOSCOPY N/A 08/12/2012   Procedure: FLEXIBLE BRONCHOSCOPY;  Surgeon: Dallas LITTIE Gelineau, MD;  Location: [facility];  Service: Pulmonary;  Laterality: N/A;   MALONEY DILATION N/A 11/12/2012   Procedure: MALONEY DILATION (54mm);  Surgeon: Lamar CHRISTELLA Hollingshead, MD;  Location: [facility];  Service: Endoscopy;  Laterality: N/A;   MULTIPLE EXTRACTIONS WITH ALVEOLOPLASTY N/A 04/22/2014   Procedure: MULTIPLE EXTRACTIONS ( 2,3,5,6,7,8,9,10,11,13,14,15,21,28  WITH ALVEOLOPLASTY;  Surgeon: Glendia Primrose, DDS;  Location: [facility];  Service: Oral Surgery;  Laterality: N/A;   WISDOM TOOTH EXTRACTION     Family History:  Family History  Problem Relation Age of Onset   Hypertension Mother    Breast cancer Mother    Melanoma Mother    Stroke Mother    Lung cancer Father 35   Colon cancer Neg Hx    Colitis Neg Hx    Cirrhosis Neg Hx    Liver disease Neg Hx    Pancreatic cancer Neg Hx    Pancreatitis Neg Hx    Anesthesia problems Neg Hx    Hypotension Neg Hx    Malignant hyperthermia Neg Hx    Pseudochol deficiency Neg Hx     Hospital Course:  Brent [name] is a 53 y.o. 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  He was started on liquid Prozac and appeared to be tolerating this well  and was otherwise stable for discharge at that time, however it appears that he was not able to obtain this medication.  Additionally, he has some suicidal thoughts and wanted to be evaluated. Patient is admitted to [facility] unit with Q15 min safety monitoring. Multidisciplinary team approach is offered. Medication management; group/milieu therapy is offered.  Detailed risk assessment is complete based on clinical exam and individual risk factors and acute suicide risk is low and acute violence risk is low.   Admission patient home medications were resumed including Prozac 10 mg daily, Xanax  1 mg 3 times daily as needed.  Patient reports unable to get his discharge medications filled in the pharmacy as a reason for his readmission.  Patient tolerated the medications well with no reported side effects.  Prozac was titrated up to 20 mg daily.  Patient maintained safe behaviors on the unit and participated in groups and treatment plan.  On the day of discharge patient denies SI/HI/plan and denies auditory/visual hallucinations.  Currently, all modifiable risk of harm to self/harm to others have been addressed and patient is no longer appropriate for the acute inpatient setting and is able to continue treatment for mental health needs in the community with the supports as indicated below.  Patient is educated and verbalized understanding of discharge plan of care including medications, follow-up appointments, mental health resources and further crisis services in the community.  He is instructed to call 911 or present to the nearest emergency room should he experience any decompensation in mood, disturbance of bowel or return of suicidal/homicidal ideations.  Patient verbalizes understanding of this education and agrees to this plan of care  Physical Findings: AIMS:  , ,  ,  ,    CIWA:    COWS:        Psychiatric Specialty Exam:  Presentation  General Appearance:  Appropriate for Environment;  Casual  Eye Contact: Fair  Speech: Normal Rate  Speech Volume: Decreased    Mood and Affect  Mood: Dysphoric; Anxious  Affect: Depressed; Flat   Thought Process  Thought Processes: Linear  Descriptions of Associations:Intact  Orientation:Full (Time, Place and Person)  Thought Content:Illogical  Hallucinations:No data recorded Ideas of Reference:None  Suicidal Thoughts:No data recorded Homicidal Thoughts:No data recorded  Sensorium  Memory: Immediate Fair; Recent  Fair; Remote Fair  Judgment: Impaired  Insight: Shallow   Executive Functions  Concentration: Poor  Attention Span: Poor  Recall: [employer] of Knowledge: Fair  Language: Fair   Psychomotor Activity  Psychomotor Activity:No data recorded Musculoskeletal: Strength & Muscle Tone: within normal limits Gait & Station: normal Assets  Assets: Manufacturing Systems Engineer; Desire for Improvement; Physical Health; Resilience   Sleep  Sleep:No data recorded   Physical Exam: Physical Exam Vitals and nursing note reviewed.    ROS Blood pressure 100/64, pulse (!) 108, temperature 98.2 F (36.8 C), resp. rate 17, height 5' 7 (1.702 m), weight 45.6 kg, SpO2 99%. Body mass index is 15.74 kg/m.   Social History   Tobacco Use  Smoking Status Every Day   Current packs/day: 1.00   Average packs/day: 1 pack/day for 21.7 years (21.7 ttl pk-yrs)   Types: Cigarettes, Cigars   Start date: 03/2002  Smokeless Tobacco Never  Tobacco Comments   1 pack per week now as of 02/24/2018    Tobacco Cessation:  A prescription for an FDA-approved tobacco cessation medication provided at discharge   Blood Alcohol  level:  Lab Results  Component Value Date   [facility] <10 06/08/2019   ETH <10 11/27/2017    Metabolic Disorder Labs:  Lab Results  Component Value Date   HGBA1C 5.2 06/09/2019   MPG 102.54 06/09/2019   No results found for: PROLACTIN Lab Results  Component Value Date   CHOL 93  11/27/2017   TRIG 94 02/25/2018   HDL 29 (L) 11/27/2017   CHOLHDL 3.2 11/27/2017   VLDL 28 11/27/2017   LDLCALC 36 11/27/2017    See Psychiatric Specialty Exam and Suicide Risk Assessment completed by Attending Physician prior to discharge.  Discharge destination:  Home  Is patient on multiple antipsychotic therapies at discharge:  No   Has Patient had three or more failed trials of antipsychotic monotherapy by history:  No  Recommended Plan for Multiple Antipsychotic Therapies: NA   Allergies as of 12/11/2023   No Known Allergies      Medication List     STOP taking these medications    FLUoxetine 20 MG/5ML solution Commonly known as: PROZAC Replaced by: FLUoxetine 10 MG tablet       TAKE these medications      Indication  ALPRAZolam  1 MG tablet Commonly known as: XANAX  Take 1 tablet (1 mg total) by mouth 3 (three) times daily as needed for anxiety (reordered home medication).  Indication: Panic Disorder   bismuth subsalicylate 262 MG/15ML suspension Commonly known as: PEPTO BISMOL Take 30 mLs by mouth every 6 (six) hours as needed for indigestion or diarrhea or loose stools.    Creon  24000-76000 units Cpep Generic drug: Pancrelipase  (Lip-Prot-Amyl) Take 1-3 capsules (24,000-72,000 Units total) by mouth See admin instructions. Take 3 capsules by mouth three times daily with each meal and take 1 capsule by mouth daily with each snack. What changed: how much to take  Indication: Pancreatic Insufficiency   cyanocobalamin 1000 MCG tablet Commonly known as: VITAMIN B12 Take 2,000 mcg by mouth daily.    docusate sodium 100 MG capsule Commonly known as: COLACE Take 1 capsule (100 mg total) by mouth daily.    feeding supplement (KATE FARMS STANDARD 1.4) Liqd liquid Take 325 mLs by mouth 2 (two) times daily between meals.    FLUoxetine 10 MG tablet Commonly known as: PROZAC Take 1 tablet (10 mg total) by mouth daily. Replaces: FLUoxetine 20 MG/5ML  solution  Indication: Depression  GOODYS BODY PAIN PO Take 1 Package by mouth 4 (four) times daily as needed (pain).    ketorolac  0.5 % ophthalmic solution Commonly known as: ACULAR  Place 2 drops into both eyes 4 (four) times daily.    multivitamin with minerals Tabs tablet Take 1 tablet by mouth daily.    naphazoline-glycerin Soln Commonly known as: CLEAR EYES REDNESS Place 1-2 drops into both eyes 4 (four) times daily as needed for eye irritation.    naproxen 500 MG tablet Commonly known as: Naprosyn Take 1 tablet (500 mg total) by mouth 2 (two) times daily with a meal.  Indication: Joint Damage causing Pain and Loss of Function   nicotine  14 mg/24hr patch Commonly known as: NICODERM CQ  - dosed in mg/24 hours Place 1 patch (14 mg total) onto the skin daily.  Indication: Nicotine  Addiction   omeprazole 40 MG capsule Commonly known as: PRILOSEC Take 1 capsule (40 mg total) by mouth at bedtime.  Indication: Indigestion   ondansetron  4 MG tablet Commonly known as: ZOFRAN  Take 4 mg by mouth every 8 (eight) hours as needed for nausea.    prednisoLONE  acetate 1 % ophthalmic suspension Commonly known as: PRED FORTE  Place 1 drop into both eyes 4 (four) times daily. What changed: Another medication with the same name was added. Make sure you understand how and when to take each.    prednisoLONE  acetate 1 % ophthalmic suspension Commonly known as: PRED FORTE  Place 1 drop into both eyes 4 (four) times daily. What changed: You were already taking a medication with the same name, and this prescription was added. Make sure you understand how and when to take each.    simethicone  80 MG chewable tablet Commonly known as: MYLICON Chew 1 tablet (80 mg total) by mouth every 6 (six) hours as needed for flatulence.  Indication: Gas   Symbicort 80-4.5 MCG/ACT inhaler Generic drug: budesonide-formoterol  Inhale 1 puff into the lungs 2 (two) times daily.    traZODone 100 MG  tablet Commonly known as: DESYREL Take 1 tablet (100 mg total) by mouth at bedtime.  Indication: Trouble Sleeping   Vitamin D  High Potency 25 MCG (1000 UT) capsule Generic drug: Cholecalciferol  Take 1,000 Units by mouth daily.                Durable Medical Equipment  (From admission, onward)           Start     Ordered   12/11/23 1344  For home use only DME Tube feeding pump  Once       Comments: Nutren 2.0- 4 cartons daily given via a pump @100ml /hr through the [facility]  Question:  Length of Need  Answer:  Lifetime   12/11/23 1344            Follow-up Information     [facility] [place]. Go on 01/09/2024.   Why: Your appointment is scheduled for 01/09/24 at 1:20 PM . You will be discussing home health as well as establishing care at this appointment along with your other medical needs. Please remember to bring your updated insurance cards. Contact information: [address] [place], [place] [address] [phone]        [facility]. Call on 12/17/2023.   Why: Appt is 11/5 @ 9:30am virtually Contact information: [address]  [address] [place] [address] [phone]                 Follow-up recommendations:  Activity:  as  tolerated    Signed: Isla Sabree, MD 12/11/2023, 7:21 PM

## 2024-01-09 ENCOUNTER — Ambulatory Visit: Payer: Self-pay
